# Patient Record
Sex: Female | Born: 1965 | Race: White | Hispanic: No | Marital: Married | Smoking: Never smoker
Health system: Southern US, Community
[De-identification: ages and names within clinical notes are randomized; demographics above are authoritative.]

## PROBLEM LIST (undated history)

## (undated) DIAGNOSIS — C801 Malignant (primary) neoplasm, unspecified: Secondary | ICD-10-CM

## (undated) DIAGNOSIS — M1990 Unspecified osteoarthritis, unspecified site: Secondary | ICD-10-CM

## (undated) DIAGNOSIS — D219 Benign neoplasm of connective and other soft tissue, unspecified: Secondary | ICD-10-CM

## (undated) DIAGNOSIS — R630 Anorexia: Secondary | ICD-10-CM

## (undated) DIAGNOSIS — Z973 Presence of spectacles and contact lenses: Secondary | ICD-10-CM

## (undated) DIAGNOSIS — F419 Anxiety disorder, unspecified: Secondary | ICD-10-CM

## (undated) DIAGNOSIS — Z801 Family history of malignant neoplasm of trachea, bronchus and lung: Secondary | ICD-10-CM

## (undated) DIAGNOSIS — Z803 Family history of malignant neoplasm of breast: Secondary | ICD-10-CM

## (undated) DIAGNOSIS — Z800 Family history of malignant neoplasm of digestive organs: Secondary | ICD-10-CM

## (undated) HISTORY — DX: Benign neoplasm of connective and other soft tissue, unspecified: D21.9

## (undated) HISTORY — DX: Family history of malignant neoplasm of breast: Z80.3

## (undated) HISTORY — DX: Family history of malignant neoplasm of digestive organs: Z80.0

## (undated) HISTORY — DX: Family history of malignant neoplasm of trachea, bronchus and lung: Z80.1

## (undated) HISTORY — DX: Anorexia: R63.0

## (undated) HISTORY — DX: Anxiety disorder, unspecified: F41.9

---
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
## 2013-09-05 ENCOUNTER — Other Ambulatory Visit: Payer: Self-pay | Admitting: Physician Assistant

## 2013-09-07 LAB — URINE CULTURE

## 2013-09-15 ENCOUNTER — Ambulatory Visit: Payer: Self-pay | Admitting: Obstetrics and Gynecology

## 2013-09-15 IMAGING — US ABDOMEN ULTRASOUND LIMITED
1 series · 14 of 25 positions shown · non-contrast
Comparison: 

REASON FOR EXAM: Nausea
COMMENTS:

PROCEDURE:     US  - US ABDOMEN LIMITED SURVEY  - [DATE]  [DATE]
RESULT:     Right or quadrant abdominal ultrasound dated
TECHNIQUE: Real time sonographic imaging of the right quadrant was obtained.
Static representative images were provided for interpretation.

[Series 1: abdomen ultrasound limited · 0.28mm/px · 14 of 57 slices shown]
[im 1/57]
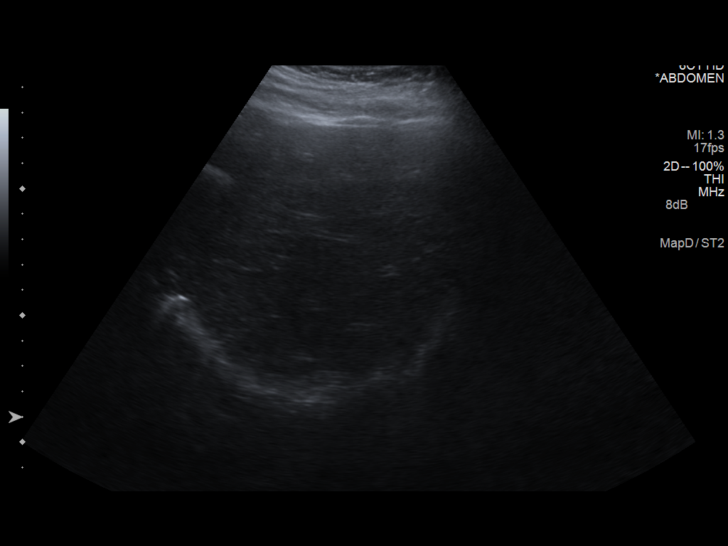
[im 5/57]
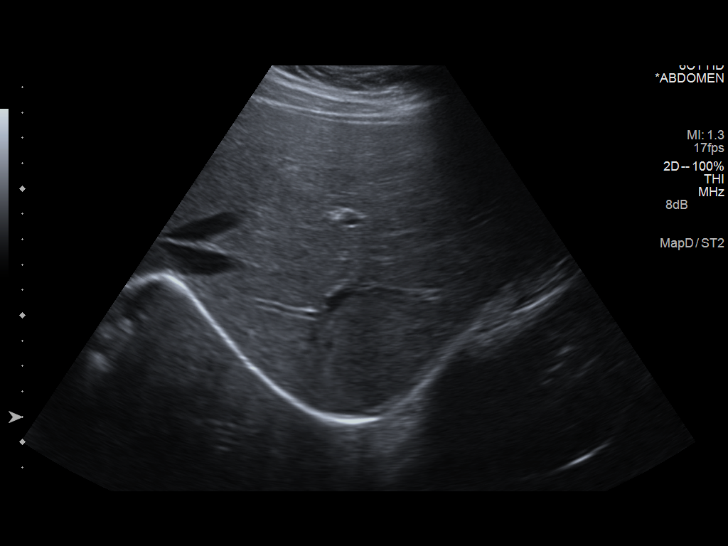
[im 10/57]
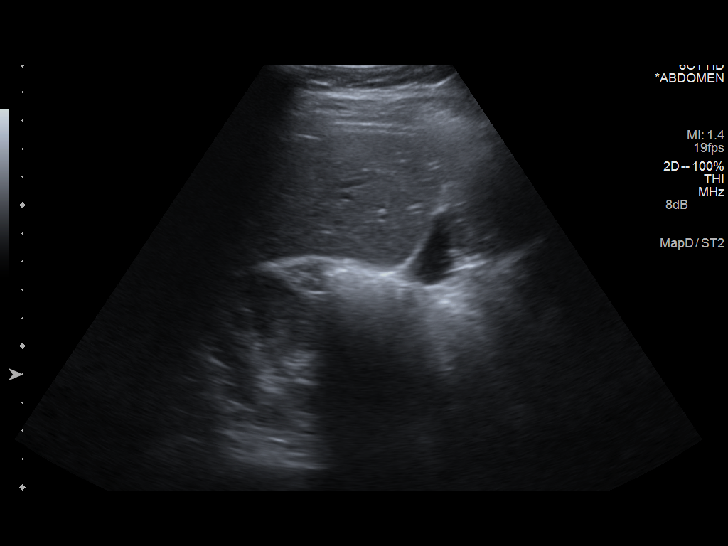
[im 15/57]
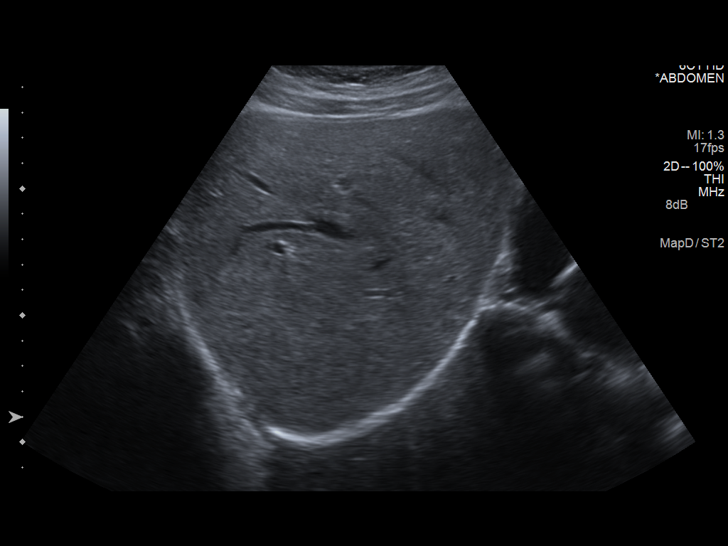
[im 19/57]
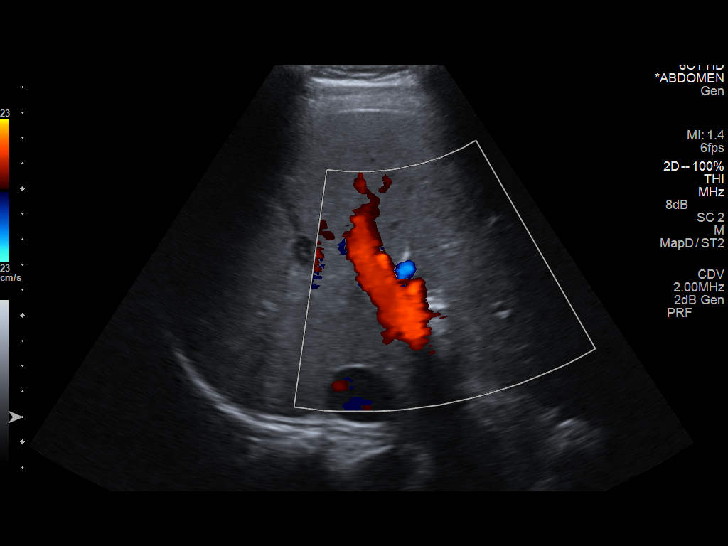
[im 22/57]
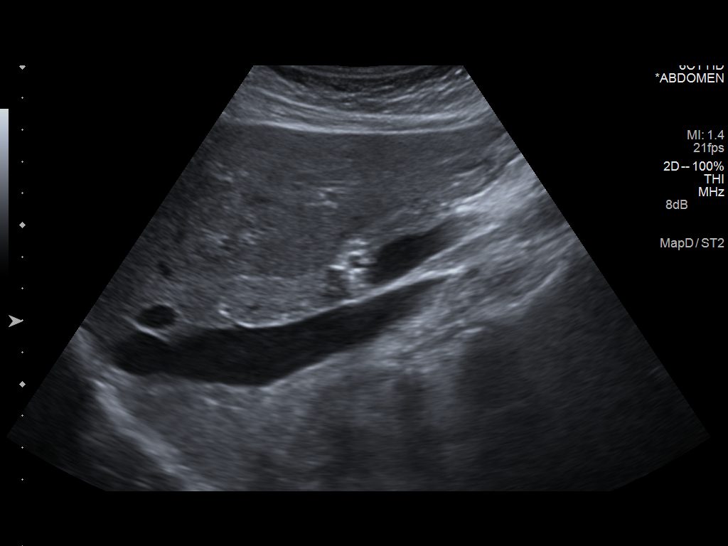
[im 26/57]
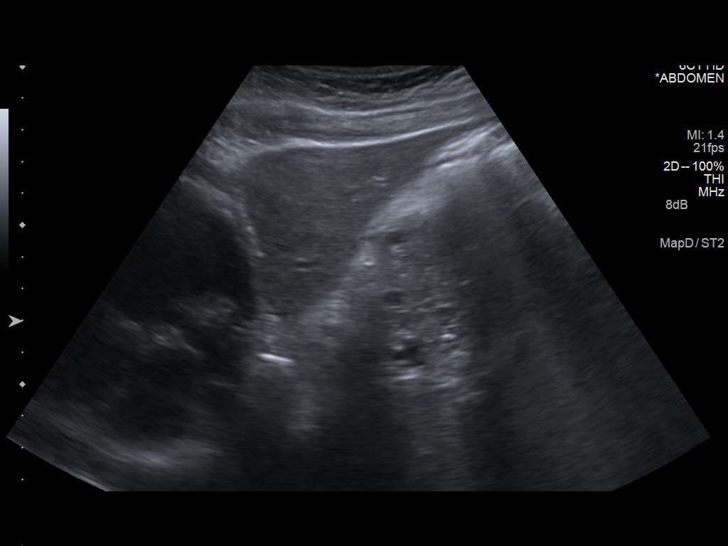
[im 31/57]
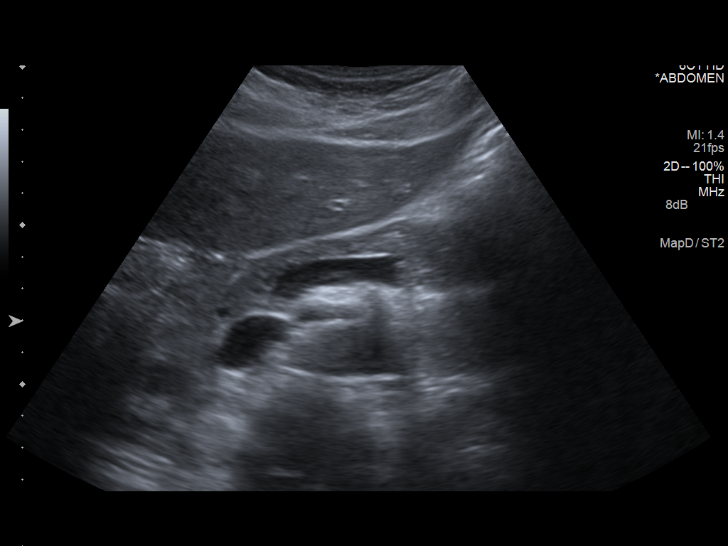
[im 36/57]
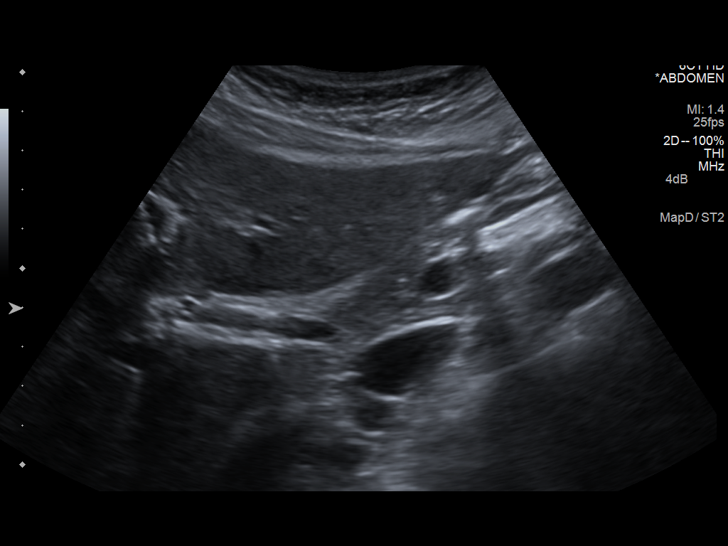
[im 38/57]
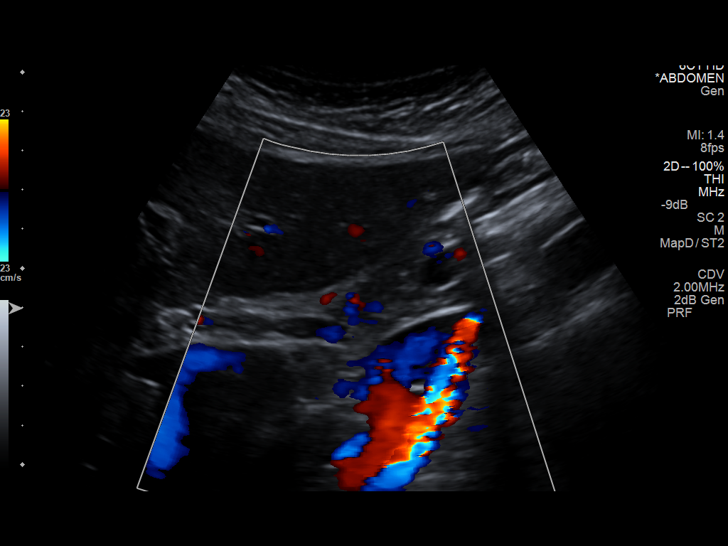
[im 43/57]
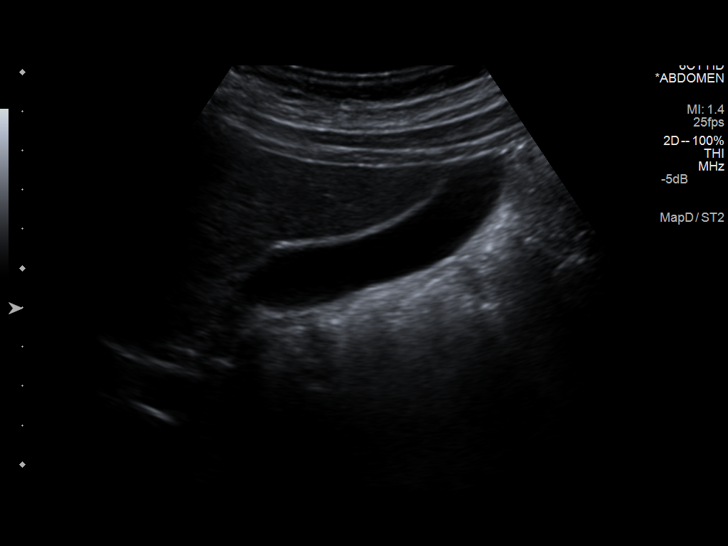
[im 47/57]
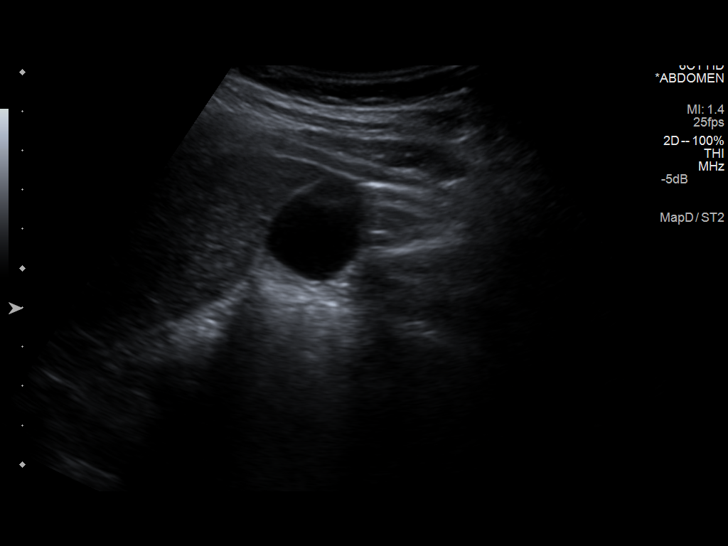
[im 52/57]
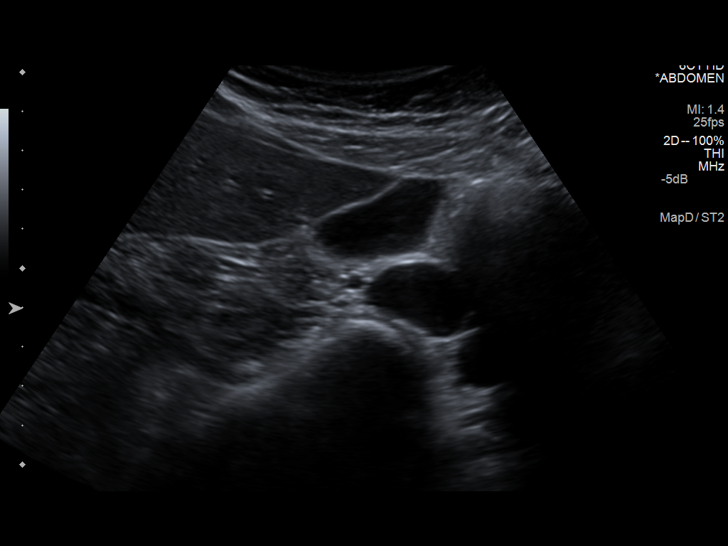
[im 57/57]
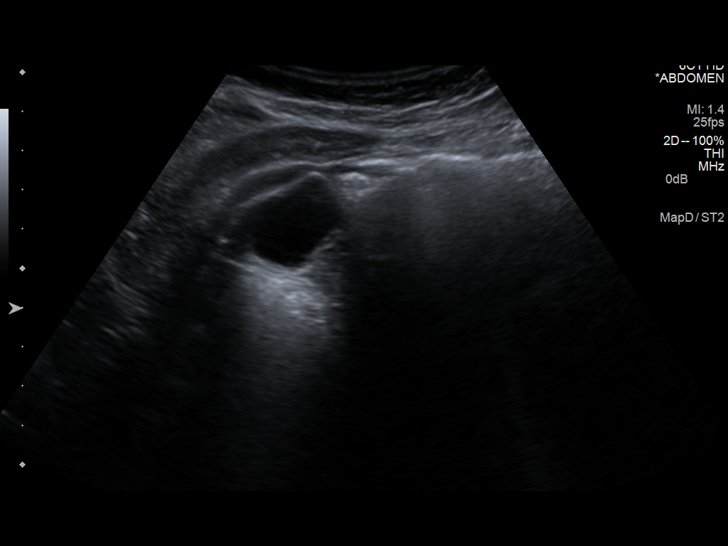

[14 of 25 positions shown; findings below may reference images not displayed]

FINDINGS: The liver is unremarkable. Hepatopedal flow is identified within
the portal vein.

There is no evidence of pericholecystic fluid, gallstones, sludging,
gallbladder wall thickening, nor common bile duct dilation. The gallbladder
wall thickness is 1.4 mm and the common bile duct measures 5.4 mm in
diameter. The technologist was unable to elicit a sonographic Murphy's sign.
The pancreas is unremarkable.
IMPRESSION: No sonographic evidence of cholecystitis nor cholelithiasis.

## 2014-03-16 ENCOUNTER — Ambulatory Visit: Payer: Self-pay | Admitting: *Deleted

## 2014-03-21 LAB — HM MAMMOGRAPHY

## 2015-04-11 LAB — HM MAMMOGRAPHY

## 2015-10-03 ENCOUNTER — Ambulatory Visit (INDEPENDENT_AMBULATORY_CARE_PROVIDER_SITE_OTHER): Payer: 59 | Admitting: Family Medicine

## 2015-10-03 ENCOUNTER — Encounter: Payer: Self-pay | Admitting: Family Medicine

## 2015-10-03 VITALS — BP 110/80 | HR 64 | Ht 61.0 in | Wt 146.0 lb

## 2015-10-03 DIAGNOSIS — M7062 Trochanteric bursitis, left hip: Secondary | ICD-10-CM | POA: Diagnosis not present

## 2015-10-03 DIAGNOSIS — Z7189 Other specified counseling: Secondary | ICD-10-CM

## 2015-10-03 DIAGNOSIS — Z7689 Persons encountering health services in other specified circumstances: Secondary | ICD-10-CM

## 2015-10-03 MED ORDER — MELOXICAM 15 MG PO TABS: 15.0000 mg | ORAL_TABLET | Freq: Every day | ORAL | Status: DC

## 2015-10-03 NOTE — Patient Instructions (Signed)
Hip Bursitis Bursitis is a swelling and soreness (inflammation) of a fluid-filled sac (bursa). This sac overlies and protects the joints.  CAUSES   Injury.  Overuse of the muscles surrounding the joint.  Arthritis.  Gout.  Infection.  Cold weather.  Inadequate warm-up and conditioning prior to activities. The cause may not be known.  SYMPTOMS   Mild to severe irritation.  Tenderness and swelling over the outside of the hip.  Pain with motion of the hip.  If the bursa becomes infected, a fever may be present. Redness, tenderness, and warmth will develop over the hip. Symptoms usually lessen in 3 to 4 weeks with treatment, but can come back. TREATMENT If conservative treatment does not work, your caregiver may advise draining the bursa and injecting cortisone into the area. This may speed up the healing process. This may also be used as an initial treatment of choice. HOME CARE INSTRUCTIONS   Apply ice to the affected area for 15-20 minutes every 3 to 4 hours while awake for the first 2 days. Put the ice in a plastic bag and place a towel between the bag of ice and your skin.  Rest the painful joint as much as possible, but continue to put the joint through a normal range of motion at least 4 times per day. When the pain lessens, begin normal, slow movements and usual activities to help prevent stiffness of the hip.  Only take over-the-counter or prescription medicines for pain, discomfort, or fever as directed by your caregiver.  Use crutches to limit weight bearing on the hip joint, if advised.  Elevate your painful hip to reduce swelling. Use pillows for propping and cushioning your legs and hips.  Gentle massage may provide comfort and decrease swelling. SEEK IMMEDIATE MEDICAL CARE IF:   Your pain increases even during treatment, or you are not improving.  You have a fever.  You have heat and inflammation over the involved bursa.  You have any other questions or  concerns. MAKE SURE YOU:   Understand these instructions.  Will watch your condition.  Will get help right away if you are not doing well or get worse.   This information is not intended to replace advice given to you by your health care provider. Make sure you discuss any questions you have with your health care provider.   Document Released: 04/24/2002 Document Revised: 01/25/2012 Document Reviewed: 06/04/2015 Elsevier Interactive Patient Education 2016 [employer]  

## 2015-10-03 NOTE — Progress Notes (Signed)
Name: Virginia [name]   MRN: [id number]    DOB: [birth year]   Date:10/03/2015       Progress Note  Subjective  Chief Complaint  Chief Complaint  Patient presents with  . Hip Pain    has had pain for approx 2 yrs, but has gotten worse for 5 months. Wakes her up at night- Aleve relieves pain some, but hip seems to get stiff the longer she walks    Hip Pain  There was no injury mechanism (left hip pain 3-4 months). The pain is present in the left leg and left hip. The quality of the pain is described as aching and cramping. The pain is at a severity of 6/10. The pain is moderate. The pain has been constant since onset. Pertinent negatives include no inability to bear weight, loss of motion, loss of sensation, muscle weakness, numbness or tingling. Exacerbated by: nocturnal during sleep. She has tried acetaminophen and NSAIDs for the symptoms. The treatment provided mild relief.    No problem-specific assessment & plan notes found for this encounter.   No past medical history on file.  Past Surgical History  Procedure Laterality Date  . Cleft palate repair      No family history on file.  Social History   Social History  . Marital Status: Married    Spouse Name: N/A  . Number of Children: N/A  . Years of Education: N/A   Occupational History  . Not on file.   Social History Main Topics  . Smoking status: Never Smoker   . Smokeless tobacco: Not on file  . Alcohol Use: No  . Drug Use: No  . Sexual Activity: Not on file   Other Topics Concern  . Not on file   Social History Narrative  . No narrative on file    No Known Allergies   Review of Systems  Constitutional: Negative for fever, chills, weight loss and malaise/fatigue.  HENT: Negative for ear discharge, ear pain and sore throat.   Eyes: Negative for blurred vision.  Respiratory: Negative for cough, sputum production, shortness of breath and wheezing.   Cardiovascular: Negative for chest pain, palpitations  and leg swelling.  Gastrointestinal: Negative for heartburn, nausea, abdominal pain, diarrhea, constipation, blood in stool and melena.  Genitourinary: Negative for dysuria, urgency, frequency and hematuria.  Musculoskeletal: Positive for myalgias. Negative for back pain, joint pain and neck pain.  Skin: Negative for rash.  Neurological: Negative for dizziness, tingling, sensory change, focal weakness, numbness and headaches.  Endo/Heme/Allergies: Negative for environmental allergies and polydipsia. Does not bruise/bleed easily.  Psychiatric/Behavioral: Negative for depression and suicidal ideas. The patient is not nervous/anxious and does not have insomnia.      Objective  Filed Vitals:   10/03/15 1059  BP: 110/80  Pulse: 64  Height: 5\' 1"  (1.549 m)  Weight: 146 lb (66.225 kg)    Physical Exam  Constitutional: She is well-developed, well-nourished, and in no distress. No distress.  HENT:  Head: Normocephalic and atraumatic.  Right Ear: External ear normal.  Left Ear: External ear normal.  Nose: Nose normal.  Mouth/Throat: Oropharynx is clear and moist.  Eyes: Conjunctivae and EOM are normal. Pupils are equal, round, and reactive to light. Right eye exhibits no discharge. Left eye exhibits no discharge.  Neck: Normal range of motion. Neck supple. No JVD present. No thyromegaly present.  Cardiovascular: Normal rate, regular rhythm, normal heart sounds and intact distal pulses.  Exam reveals no gallop and no friction rub.  No murmur heard. Pulmonary/Chest: Effort normal and breath sounds normal.  Abdominal: Soft. Bowel sounds are normal. She exhibits no mass. There is no tenderness. There is no guarding.  Musculoskeletal: Normal range of motion. She exhibits no edema.  Lymphadenopathy:    She has no cervical adenopathy.  Neurological: She is alert. She has normal reflexes.  Skin: Skin is warm and dry. She is not diaphoretic.  Psychiatric: Mood and affect normal.       Assessment & Plan  Problem List Items Addressed This Visit    None        Dr. Otilio Miu [facility] [place]  10/03/2015

## 2015-10-07 DIAGNOSIS — M25559 Pain in unspecified hip: Secondary | ICD-10-CM | POA: Insufficient documentation

## 2015-11-28 ENCOUNTER — Encounter: Payer: Self-pay | Admitting: Family Medicine

## 2015-11-28 ENCOUNTER — Ambulatory Visit (INDEPENDENT_AMBULATORY_CARE_PROVIDER_SITE_OTHER): Payer: 59 | Admitting: Family Medicine

## 2015-11-28 VITALS — BP 118/81 | HR 81 | Temp 98.1°F | Resp 16 | Ht 61.0 in | Wt 143.1 lb

## 2015-11-28 DIAGNOSIS — M25569 Pain in unspecified knee: Secondary | ICD-10-CM

## 2015-11-28 DIAGNOSIS — M25559 Pain in unspecified hip: Secondary | ICD-10-CM

## 2015-11-28 NOTE — Progress Notes (Signed)
Name: Virginia [name]   MRN: [id number]    DOB: 09-08-1966   Date:11/28/2015       Progress Note  Subjective  Chief Complaint  Chief Complaint  Patient presents with  . Pain    Hip, Fingers, Lower Back, Knees    Hip Pain  The incident occurred more than 1 week ago. The quality of the pain is described as aching. The pain is at a severity of 3/10. The pain is mild. The pain has been intermittent since onset. Pertinent negatives include no inability to bear weight, loss of motion, loss of sensation, muscle weakness, numbness or tingling. Nothing aggravates the symptoms. She has tried NSAIDs for the symptoms. The treatment provided mild relief.    No problem-specific assessment & plan notes found for this encounter.   History reviewed. No pertinent past medical history.  Past Surgical History  Procedure Laterality Date  . Cleft palate repair      History reviewed. No pertinent family history.  Social History   Social History  . Marital Status: Married    Spouse Name: N/A  . Number of Children: N/A  . Years of Education: N/A   Occupational History  . Not on file.   Social History Main Topics  . Smoking status: Never Smoker   . Smokeless tobacco: Not on file  . Alcohol Use: No  . Drug Use: No  . Sexual Activity: Not on file   Other Topics Concern  . Not on file   Social History Narrative    No Known Allergies   Review of Systems  Constitutional: Negative for fever, chills, weight loss and malaise/fatigue.  HENT: Negative for ear discharge, ear pain and sore throat.   Eyes: Negative for blurred vision.  Respiratory: Negative for cough, sputum production, shortness of breath and wheezing.   Cardiovascular: Negative for chest pain, palpitations and leg swelling.  Gastrointestinal: Negative for heartburn, nausea, abdominal pain, diarrhea, constipation, blood in stool and melena.  Genitourinary: Negative for dysuria, urgency, frequency and hematuria.   Musculoskeletal: Positive for joint pain. Negative for myalgias, back pain and neck pain.  Skin: Negative for rash.  Neurological: Negative for dizziness, tingling, sensory change, focal weakness, numbness and headaches.  Endo/Heme/Allergies: Negative for environmental allergies and polydipsia. Does not bruise/bleed easily.  Psychiatric/Behavioral: Negative for depression and suicidal ideas. The patient is not nervous/anxious and does not have insomnia.      Objective  Filed Vitals:   11/28/15 0926  BP: 118/81  Pulse: 81  Temp: 98.1 F (36.7 C)  Resp: 16  Height: _0  (1.549 m)  Weight: 143 lb 1.6 oz (64.91 kg)    Physical Exam  Constitutional: She is well-developed, well-nourished, and in no distress. No distress.  HENT:  Head: Normocephalic and atraumatic.  Right Ear: External ear normal.  Left Ear: External ear normal.  Nose: Nose normal.  Mouth/Throat: Oropharynx is clear and moist.  Eyes: Conjunctivae and EOM are normal. Pupils are equal, round, and reactive to light. Right eye exhibits no discharge. Left eye exhibits no discharge.  Neck: Normal range of motion. Neck supple. No JVD present. No thyromegaly present.  Cardiovascular: Normal rate, regular rhythm, normal heart sounds and intact distal pulses.  Exam reveals no gallop and no friction rub.   No murmur heard. Pulmonary/Chest: Effort normal and breath sounds normal.  Abdominal: Soft. Bowel sounds are normal. She exhibits no mass. There is no tenderness. There is no guarding.  Musculoskeletal: Normal range of motion. She exhibits no  edema.       Right wrist: She exhibits tenderness.       Right hip: She exhibits tenderness.       Left hip: She exhibits tenderness.       Right hand: She exhibits tenderness.  Lymphadenopathy:    She has no cervical adenopathy.  Neurological: She is alert. She has normal reflexes.  Skin: Skin is warm and dry. She is not diaphoretic.  Psychiatric: Mood and affect normal.   Nursing note and vitals reviewed.     Assessment & Plan  Problem List Items Addressed This Visit    None    Visit Diagnoses    Chronic arthralgias of knees and hips, unspecified laterality    -  Primary    tylenol with episodic meloxicam    Relevant Orders    Sed Rate (ESR)         Dr. [facility]  11/28/2015

## 2015-11-28 NOTE — Patient Instructions (Signed)

## 2015-11-29 LAB — SEDIMENTATION RATE: Sed Rate: 2 mm/hr (ref 0–32)

## 2016-03-12 MED FILL — ESCITALOPRAM 20 MG TABLET: 20 | 90 days supply | Qty: 90 | Fill #2

## 2016-07-16 DIAGNOSIS — D225 Melanocytic nevi of trunk: Secondary | ICD-10-CM | POA: Diagnosis not present

## 2016-07-16 DIAGNOSIS — L859 Epidermal thickening, unspecified: Secondary | ICD-10-CM | POA: Diagnosis not present

## 2016-07-16 DIAGNOSIS — L821 Other seborrheic keratosis: Secondary | ICD-10-CM | POA: Diagnosis not present

## 2016-07-16 DIAGNOSIS — D2271 Melanocytic nevi of right lower limb, including hip: Secondary | ICD-10-CM | POA: Diagnosis not present

## 2016-07-16 DIAGNOSIS — D2272 Melanocytic nevi of left lower limb, including hip: Secondary | ICD-10-CM | POA: Diagnosis not present

## 2016-07-16 DIAGNOSIS — D2261 Melanocytic nevi of right upper limb, including shoulder: Secondary | ICD-10-CM | POA: Diagnosis not present

## 2016-07-16 DIAGNOSIS — D2262 Melanocytic nevi of left upper limb, including shoulder: Secondary | ICD-10-CM | POA: Diagnosis not present

## 2016-09-04 DIAGNOSIS — F509 Eating disorder, unspecified: Secondary | ICD-10-CM | POA: Diagnosis not present

## 2016-09-04 DIAGNOSIS — Z0000 Encounter for general adult medical examination without abnormal findings: Secondary | ICD-10-CM | POA: Diagnosis not present

## 2016-09-07 DIAGNOSIS — F509 Eating disorder, unspecified: Secondary | ICD-10-CM | POA: Diagnosis not present

## 2016-09-17 DIAGNOSIS — Z713 Dietary counseling and surveillance: Secondary | ICD-10-CM | POA: Diagnosis not present

## 2016-09-17 DIAGNOSIS — Z0000 Encounter for general adult medical examination without abnormal findings: Secondary | ICD-10-CM | POA: Diagnosis not present

## 2016-09-17 DIAGNOSIS — E559 Vitamin D deficiency, unspecified: Secondary | ICD-10-CM | POA: Diagnosis not present

## 2016-09-17 DIAGNOSIS — F509 Eating disorder, unspecified: Secondary | ICD-10-CM | POA: Diagnosis not present

## 2016-09-17 DIAGNOSIS — Z1231 Encounter for screening mammogram for malignant neoplasm of breast: Secondary | ICD-10-CM | POA: Diagnosis not present

## 2016-09-17 DIAGNOSIS — Z01419 Encounter for gynecological examination (general) (routine) without abnormal findings: Secondary | ICD-10-CM | POA: Diagnosis not present

## 2016-09-28 DIAGNOSIS — Z1231 Encounter for screening mammogram for malignant neoplasm of breast: Secondary | ICD-10-CM | POA: Diagnosis not present

## 2016-10-21 DIAGNOSIS — J019 Acute sinusitis, unspecified: Secondary | ICD-10-CM | POA: Diagnosis not present

## 2016-10-22 MED FILL — ESCITALOPRAM 20 MG TABLET: 20 | 90 days supply | Qty: 90 | Fill #0

## 2016-10-22 MED FILL — AZITHROMYCIN 250 MG TABLET: 250 | 5 days supply | Qty: 6 | Fill #0

## 2017-03-05 MED FILL — ESCITALOPRAM 20 MG TABLET: 20 | 90 days supply | Qty: 90 | Fill #1

## 2017-04-21 DIAGNOSIS — R42 Dizziness and giddiness: Secondary | ICD-10-CM | POA: Diagnosis not present

## 2017-04-21 DIAGNOSIS — H65192 Other acute nonsuppurative otitis media, left ear: Secondary | ICD-10-CM | POA: Diagnosis not present

## 2017-04-21 DIAGNOSIS — R0981 Nasal congestion: Secondary | ICD-10-CM | POA: Diagnosis not present

## 2017-06-09 ENCOUNTER — Ambulatory Visit (INDEPENDENT_AMBULATORY_CARE_PROVIDER_SITE_OTHER): Payer: 59 | Admitting: Advanced Practice Midwife

## 2017-06-09 ENCOUNTER — Encounter: Payer: Self-pay | Admitting: Advanced Practice Midwife

## 2017-06-09 VITALS — BP 108/58 | HR 78 | Ht 61.0 in | Wt 135.0 lb

## 2017-06-09 DIAGNOSIS — Z0000 Encounter for general adult medical examination without abnormal findings: Secondary | ICD-10-CM

## 2017-06-09 DIAGNOSIS — Z01419 Encounter for gynecological examination (general) (routine) without abnormal findings: Secondary | ICD-10-CM | POA: Diagnosis not present

## 2017-06-09 DIAGNOSIS — Z124 Encounter for screening for malignant neoplasm of cervix: Secondary | ICD-10-CM

## 2017-06-09 NOTE — Progress Notes (Signed)
Patient ID: Virginia [name], female   DOB: 10-02-1966, 51 y.o.   MRN: [id number]     Gynecology Annual Exam  PCP: Patient, No Pcp Per  Chief Complaint:  Chief Complaint  Patient presents with  . Gynecologic Exam    History of Present Illness: Patient is a 51 y.o. [id number] presents for annual exam. The patient has complaints today of leakage of stool. She has a history of perineal laceration with rectal involvement. She was diagnosed with weakness in rectal wall and internal hemorrhoids in 2007. At that time she was having leakage of stool. She had a procedure to remove the hemorrhoids which resolved the leakage until about 3-4 months ago when she had a recurrence. She states this interferes with her daily life. She has not had a colonoscopy. She requests other screening lab work today.  LMP: No LMP recorded. Patient is not currently having periods (Reason: IUD). Postcoital Bleeding: no Dysmenorrhea: not applicable   The patient is sexually active. She currently uses IUD for contraception. She denies dyspareunia.  The patient does perform self breast exams.  There is no notable family history of breast or ovarian cancer in her family.  The patient wears seatbelts: yes.   The patient has regular exercise: yes.    The patient denies current symptoms of depression.    Review of Systems: Review of Systems  Constitutional: Negative.   HENT: Negative.   Eyes: Negative.   Respiratory: Negative.   Cardiovascular: Negative.   Gastrointestinal: Negative.   Genitourinary: Negative.   Musculoskeletal: Negative.   Skin: Negative.   Neurological: Negative.   Endo/Heme/Allergies: Negative.   Psychiatric/Behavioral: Negative.     Past Medical History:  History reviewed. No pertinent past medical history.  Past Surgical History:  Past Surgical History:  Procedure Laterality Date  . CLEFT PALATE REPAIR      Gynecologic History:  No LMP recorded. Patient is not currently having  periods (Reason: IUD). Contraception: IUD Last Pap: Results were: 3 years ago no abnormalities  Last mammogram: 1 year ago Results were: BI-RAD I Obstetric History: [id number]  Family History:  History reviewed. No pertinent family history.  Social History:  Social History   Social History  . Marital status: Married    Spouse name: N/A  . Number of children: N/A  . Years of education: N/A   Occupational History  . Not on file.   Social History Main Topics  . Smoking status: Never Smoker  . Smokeless tobacco: Never Used  . Alcohol use No  . Drug use: No  . Sexual activity: Yes    Partners: Male    Birth control/ protection: IUD   Other Topics Concern  . Not on file   Social History Narrative  . No narrative on file    Allergies:  No Known Allergies  Medications: Prior to Admission medications   Medication Sig Start Date End Date Taking? Authorizing Provider  escitalopram (LEXAPRO) 20 MG tablet Take 1 tablet by mouth daily. 07/08/15  Yes [provider]  levonorgestrel (MIRENA) 20 MCG/24HR IUD 1 each by Intrauterine route once.   Yes [place]    Physical Exam Vitals: Blood pressure (!) 108/58, pulse 78, height 5\' 1"  (1.549 m), weight 135 lb (61.2 kg).  General: NAD HEENT: normocephalic, anicteric Thyroid: no enlargement, no palpable nodules Pulmonary: No increased work of breathing, CTAB Cardiovascular: RRR, distal pulses 2+ Breast: Breast symmetrical, no tenderness, no palpable nodules or masses, no skin or nipple retraction  present, no nipple discharge.  No axillary or supraclavicular lymphadenopathy. Abdomen: NABS, soft, non-tender, non-distended.  Umbilicus without lesions.  No hepatomegaly, splenomegaly or masses palpable. No evidence of hernia  Genitourinary:  External: Normal external female genitalia.  Normal urethral meatus, normal  Bartholin's and Skene's glands.    Vagina: Normal vaginal mucosa, no evidence of prolapse.     Cervix: Grossly normal in appearance, no bleeding, no CMT  Uterus: Non-enlarged, mobile, normal contour.    Adnexa: ovaries non-enlarged, no adnexal masses  Rectal: no internal hemorrhoids palpated  Lymphatic: no evidence of inguinal lymphadenopathy Extremities: no edema, erythema, or tenderness Neurologic: Grossly intact Psychiatric: mood appropriate, affect full   Assessment: 51 y.o. [id number] Well woman exam with PAP smear  Plan: Problem List Items Addressed This Visit    None    Visit Diagnoses    Well woman exam with routine gynecological exam    -  Primary   Relevant Orders   IGP, Aptima HPV   Blood tests for routine general physical examination       Relevant Orders   Lipid Panel With LDL/HDL Ratio   VITAMIN D 25 Hydroxy (Vit-D Deficiency, Fractures)   CBC   Hgb A1c w/o eAG   Cervical cancer screening       Relevant Orders   IGP, Aptima HPV      1) Mammogram - recommend yearly screening mammogram.  Mammogram Was ordered today   2) STI screening was offered and declined  3) ASCCP guidelines and rational discussed.  Patient opts for every 3 years screening interval  4) Contraception - patient plans to continue with Mirena  5) Colonoscopy: referral sent today  Screening recommended starting at age 49 for average risk individuals, age 64 for individuals deemed at increased risk (including African Americans) and recommended to continue until age 49.  For patient age 49-85 individualized approach is recommended.  Gold standard screening is via colonoscopy, Cologuard screening is an acceptable alternative for patient unwilling or unable to undergo colonoscopy.  "Colorectal cancer screening for average?risk adults: 2018 guideline update from the [place]: A Cancer Journal for Clinicians: Apr 14, 2017   6) Routine healthcare maintenance including cholesterol, diabetes screening discussed Ordered today   7) Return to clinic in 1 year for annual  exam   Rod Can, CNM

## 2017-06-10 LAB — CBC
Hematocrit: 37.4 % (ref 34.0–46.6)
Hemoglobin: 12.5 g/dL (ref 11.1–15.9)
MCH: 29.5 pg (ref 26.6–33.0)
MCHC: 33.4 g/dL (ref 31.5–35.7)
MCV: 88 fL (ref 79–97)
Platelets: 175 10*3/uL (ref 150–379)
RBC: 4.24 x10E6/uL (ref 3.77–5.28)
RDW: 13.3 % (ref 12.3–15.4)
WBC: 8.4 10*3/uL (ref 3.4–10.8)

## 2017-06-10 LAB — VITAMIN D 25 HYDROXY (VIT D DEFICIENCY, FRACTURES): Vit D, 25-Hydroxy: 29.2 ng/mL — ABNORMAL LOW (ref 30.0–100.0)

## 2017-06-10 LAB — LIPID PANEL WITH LDL/HDL RATIO
Cholesterol, Total: 157 mg/dL (ref 100–199)
HDL: 63 mg/dL (ref >39)
LDL Calculated: 78 mg/dL (ref 0–99)
LDl/HDL Ratio: 1.2 ratio (ref 0.0–3.2)
Triglycerides: 78 mg/dL (ref 0–149)
VLDL Cholesterol Cal: 16 mg/dL (ref 5–40)

## 2017-06-10 LAB — HGB A1C W/O EAG: Hgb A1c MFr Bld: 5.1 % (ref 4.8–5.6)

## 2017-06-12 LAB — IGP, APTIMA HPV
HPV Aptima: NEGATIVE
PAP Smear Comment: 0

## 2017-06-15 ENCOUNTER — Telehealth: Payer: Self-pay

## 2017-06-15 ENCOUNTER — Other Ambulatory Visit: Payer: Self-pay

## 2017-06-15 DIAGNOSIS — Z1211 Encounter for screening for malignant neoplasm of colon: Secondary | ICD-10-CM

## 2017-06-15 NOTE — Telephone Encounter (Signed)
Gastroenterology Pre-Procedure Review  Request Date: 08/06/17 Requesting Physician: Dr. Vicente Males  PATIENT REVIEW QUESTIONS: The patient responded to the following health history questions as indicated:    1. Are you having any GI issues? no 2. Do you have a personal history of Polyps? no 3. Do you have a family history of Colon Cancer or Polyps? no 4. Diabetes Mellitus? no 5. Joint replacements in the past 12 months?no 6. Major health problems in the past 3 months?no 7. Any artificial heart valves, MVP, or defibrillator?no    MEDICATIONS & ALLERGIES:    Patient reports the following regarding taking any anticoagulation/antiplatelet therapy:   Plavix, Coumadin, Eliquis, Xarelto, Lovenox, Pradaxa, Brilinta, or Effient? no Aspirin? no  Patient confirms/reports the following medications:  Current Outpatient Prescriptions  Medication Sig Dispense Refill  . escitalopram (LEXAPRO) 20 MG tablet Take 1 tablet by mouth daily.  4  . levonorgestrel (MIRENA) 20 MCG/24HR IUD 1 each by Intrauterine route once.     No current facility-administered medications for this visit.     Patient confirms/reports the following allergies:  No Known Allergies  No orders of the defined types were placed in this encounter.   AUTHORIZATION INFORMATION Primary Insurance: 1D#: Group #:  Secondary Insurance: 1D#: Group #:  SCHEDULE INFORMATION: Date: 08/06/17 Time: Location:[facility]

## 2017-07-15 ENCOUNTER — Ambulatory Visit: Payer: Self-pay | Admitting: Family Medicine

## 2017-07-23 MED FILL — ESCITALOPRAM 20 MG TABLET: 20 | 90 days supply | Qty: 90 | Fill #2

## 2017-08-03 ENCOUNTER — Telehealth: Payer: Self-pay | Admitting: Gastroenterology

## 2017-08-03 NOTE — Telephone Encounter (Signed)
Returned patient's call to reschedule.   LVM for callback.

## 2017-08-03 NOTE — Telephone Encounter (Signed)
Please call patient as she needs to r/s her procedure this Fri 08/06/17 due to work schedule.

## 2017-08-05 ENCOUNTER — Encounter: Payer: Self-pay | Admitting: *Deleted

## 2017-08-06 ENCOUNTER — Ambulatory Visit: Admission: RE | Admit: 2017-08-06 | Payer: 59 | Source: Ambulatory Visit | Admitting: Gastroenterology

## 2017-08-06 ENCOUNTER — Encounter: Payer: Self-pay | Admitting: Anesthesiology

## 2017-08-06 ENCOUNTER — Encounter: Admission: RE | Payer: Self-pay | Source: Ambulatory Visit

## 2017-08-06 SURGERY — COLONOSCOPY WITH PROPOFOL
Anesthesia: General

## 2017-08-19 ENCOUNTER — Ambulatory Visit (INDEPENDENT_AMBULATORY_CARE_PROVIDER_SITE_OTHER): Payer: 59 | Admitting: Obstetrics & Gynecology

## 2017-08-19 ENCOUNTER — Encounter: Payer: Self-pay | Admitting: Obstetrics & Gynecology

## 2017-08-19 VITALS — BP 82/60 | HR 81 | Ht 61.0 in | Wt 135.0 lb

## 2017-08-19 DIAGNOSIS — N83202 Unspecified ovarian cyst, left side: Secondary | ICD-10-CM | POA: Diagnosis not present

## 2017-08-19 DIAGNOSIS — N926 Irregular menstruation, unspecified: Secondary | ICD-10-CM | POA: Insufficient documentation

## 2017-08-19 NOTE — Progress Notes (Signed)
HPI:      [name] is a 51 y.o. [id number] who LMP was Patient's last menstrual period was 08/14/2017., presents today for a problem visit.  She complains of bleeding daily that  began several days ago and its severity is described as moderate.  She has had amenorrhea since Mirena placed 4 1/2 years ago. Current sx's have associated findings of nausea, fatigue, and crampiness.  No modifiers She has used the following for attempts at control: thin pad.  Previous evaluation: none. 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 Previous Treatment: none.  She is single partner, contraception - vasectomy.   PMHx: She  has no past medical history on file. Also,  has a past surgical history that includes Cleft palate repair., family history is not on file.,  reports that she has never smoked. She has never used smokeless tobacco. She reports that she does not drink alcohol or use drugs.  She  Current Outpatient Prescriptions:  .  escitalopram (LEXAPRO) 20 MG tablet, Take 1 tablet by mouth daily., Disp: , Rfl: 4 .  levonorgestrel (MIRENA) 20 MCG/24HR IUD, 1 each by Intrauterine route once., Disp: , Rfl:   Also, has No Known Allergies.  Review of Systems  Constitutional: Negative for chills, fever and malaise/fatigue.  HENT: Negative for congestion, sinus pain and sore throat.   Eyes: Negative for blurred vision and pain.  Respiratory: Negative for cough and wheezing.   Cardiovascular: Negative for chest pain and leg swelling.  Gastrointestinal: Negative for abdominal pain, constipation, diarrhea, heartburn, nausea and vomiting.  Genitourinary: Negative for dysuria, frequency, hematuria and urgency.  Musculoskeletal: Negative for back pain, joint pain, myalgias and neck pain.  Skin: Negative for itching and rash.  Neurological: Negative for dizziness, tremors and weakness.  Endo/Heme/Allergies: Does not bruise/bleed easily.  Psychiatric/Behavioral: Negative for depression.  The patient is not nervous/anxious and does not have insomnia.    Objective: BP (!) 82/60   Pulse 81   Ht 5\' 1"  (1.549 m)   Wt 135 lb (61.2 kg)   LMP 08/14/2017   BMI 25.51 kg/m  Physical Exam  Constitutional: She is oriented to person, place, and time. She appears well-developed and well-nourished. No distress.  Genitourinary: Vagina normal and uterus normal. Pelvic exam was performed with patient supine. There is no rash, tenderness or lesion on the right labia. There is no rash, tenderness or lesion on the left labia. No erythema or bleeding in the vagina. Right adnexum does not display mass and does not display tenderness.  Left adnexum displays mass, displays tenderness and displays fullness. Cervix does not exhibit motion tenderness, discharge, polyp or nabothian cyst.   Uterus is mobile and anteverted. Uterus is not enlarged or exhibiting a mass.  Genitourinary Comments: IUD String 1 cm LEFT firm Adnexal mass, sl T Uterus Anteverted  Abdominal: Soft. She exhibits no distension. There is no tenderness.  Musculoskeletal: Normal range of motion.  Neurological: She is alert and oriented to person, place, and time. No cranial nerve deficit.  Skin: Skin is warm and dry.  Psychiatric: She has a normal mood and affect.   ASSESSMENT/PLAN:  irregular menses and left ovarian mass  Problem List Items Addressed This Visit      Other   Irregular bleeding - Primary (new finding)   Relevant Orders   US PELVIS TRANSVANGINAL    Other Visit Diagnoses    Left ovarian cyst    (new finding)   Relevant Orders   US  PELVIS TRANSVANGINAL     BTB may be related to hormones of IUD giving out in final year, and woiuld possibly be fixed by replacement.  Also could take out and see what natural pattern is at this time.  Vasec so low risk for pregnancy.  Assess for ovarian cyst/mass as well- new finding  Barnett Applebaum, MD, [facility], [facility] 08/19/2017  4:16 PM

## 2017-08-25 ENCOUNTER — Other Ambulatory Visit: Payer: 59

## 2017-08-25 ENCOUNTER — Ambulatory Visit: Payer: 59 | Admitting: Obstetrics & Gynecology

## 2017-08-30 ENCOUNTER — Ambulatory Visit: Payer: 59 | Admitting: Obstetrics & Gynecology

## 2017-08-30 ENCOUNTER — Other Ambulatory Visit: Payer: 59

## 2017-08-31 ENCOUNTER — Ambulatory Visit (INDEPENDENT_AMBULATORY_CARE_PROVIDER_SITE_OTHER): Payer: 59 | Admitting: Obstetrics & Gynecology

## 2017-08-31 ENCOUNTER — Encounter: Payer: Self-pay | Admitting: Obstetrics & Gynecology

## 2017-08-31 ENCOUNTER — Other Ambulatory Visit: Payer: Self-pay | Admitting: Obstetrics & Gynecology

## 2017-08-31 ENCOUNTER — Ambulatory Visit (INDEPENDENT_AMBULATORY_CARE_PROVIDER_SITE_OTHER): Payer: 59

## 2017-08-31 VITALS — BP 110/70 | HR 79 | Ht 63.0 in | Wt 139.0 lb

## 2017-08-31 DIAGNOSIS — N83202 Unspecified ovarian cyst, left side: Secondary | ICD-10-CM | POA: Diagnosis not present

## 2017-08-31 DIAGNOSIS — N926 Irregular menstruation, unspecified: Secondary | ICD-10-CM | POA: Diagnosis not present

## 2017-08-31 DIAGNOSIS — D219 Benign neoplasm of connective and other soft tissue, unspecified: Secondary | ICD-10-CM | POA: Insufficient documentation

## 2017-08-31 NOTE — Patient Instructions (Signed)
Total Laparoscopic Hysterectomy °A total laparoscopic hysterectomy is a minimally invasive surgery to remove your uterus and cervix. This surgery is performed by making several small cuts (incisions) in your abdomen. It can also be done with a thin, lighted tube (laparoscope) inserted into two small incisions in your lower abdomen. Your fallopian tubes and ovaries can be removed (bilateral salpingo-oophorectomy) during this surgery as well. Benefits of minimally invasive surgery include: °· Less pain. °· Less risk of blood loss. °· Less risk of infection. °· Quicker return to normal activities. ° °Tell a health care provider about: °· Any allergies you have. °· All medicines you are taking, including vitamins, herbs, eye drops, creams, and over-the-counter medicines. °· Any problems you or family members have had with anesthetic medicines. °· Any blood disorders you have. °· Any surgeries you have had. °· Any medical conditions you have. °What are the risks? °Generally, this is a safe procedure. However, as with any procedure, complications can occur. Possible complications include: °· Bleeding. °· Blood clots in the legs or lung. °· Infection. °· Injury to surrounding organs. °· Problems with anesthesia. °· Early menopause symptoms (hot flashes, night sweats, insomnia). °· Risk of conversion to an open abdominal incision. ° °What happens before the procedure? °· Ask your health care provider about changing or stopping your regular medicines. °· Do not take aspirin or blood thinners (anticoagulants) for 1 week before the surgery or as told by your health care provider. °· Do not eat or drink anything for 8 hours before the surgery or as told by your health care provider. °· Quit smoking if you smoke. °· Arrange for a ride home after surgery and for someone to help you at home during recovery. °What happens during the procedure? °· You will be given antibiotic medicine. °· An IV tube will be placed in your arm. You  will be given medicine to make you sleep (general anesthetic). °· A gas (carbon dioxide) will be used to inflate your abdomen. This will allow your surgeon to look inside your abdomen, perform your surgery, and treat any other problems found if necessary. °· Three or four small incisions (often less than 1/2 inch) will be made in your abdomen. One of these incisions will be made in the area of your belly button (navel). The laparoscope will be inserted into the incision. Your surgeon will look through the laparoscope while doing your procedure. °· Other surgical instruments will be inserted through the other incisions. °· Your uterus may be removed through your vagina or cut into small pieces and removed through the small incisions. °· Your incisions will be closed. °What happens after the procedure? °· The gas will be released from inside your abdomen. °· You will be taken to the recovery area where a nurse will watch and check your progress. Once you are awake, stable, and taking fluids well, without other problems, you will return to your room or be allowed to go home. °· There is usually minimal discomfort following the surgery because the incisions are so small. °· You will be given pain medicine while you are in the hospital and for when you go home. °This information is not intended to replace advice given to you by your health care provider. Make sure you discuss any questions you have with your health care provider. °Document Released: 08/30/2007 Document Revised: 04/09/2016 Document Reviewed: 05/23/2013 °Elsevier Interactive Patient Education © 2017 Elsevier Inc. ° °

## 2017-08-31 NOTE — Progress Notes (Signed)
  HPI: Patient presents with abnormal BTB on 4th year of Mirena as well as abnormal pelvic exam with enlarged uterus or pelvic mass. Periods are rare, lasting a few days. Dysmenorrhea:none. Cyclic symptoms include concern over a eating disorder when she has reg cycles (IUD has helped). No intermenstrual bleeding, spotting, or discharge.   Ultrasound demonstrates 3 fibroids seen, one submucosal.  3,3, and 5 cm. These findings are Pelvis abnormal and also IUD seen malpositioned somewhat due to fibroid.   PMHx: She  has no past medical history on file. Also,  has a past surgical history that includes Cleft palate repair., family history is not on file.,  reports that she has never smoked. She has never used smokeless tobacco. She reports that she does not drink alcohol or use drugs.  She has a current medication list which includes the following prescription(s): escitalopram and levonorgestrel. Also, has No Known Allergies.  Review of Systems  All other systems reviewed and are negative.   Objective: BP 110/70   Pulse 79   Ht 5\' 3"  (1.6 m)   Wt 139 lb (63 kg)   LMP 08/14/2017   BMI 24.62 kg/m   Physical examination Constitutional NAD, Conversant  Skin No rashes, lesions or ulceration.   Extremities: Moves all appropriately.  Normal ROM for age. No lymphadenopathy.  Neuro: Grossly intact  Psych: Oriented to PPT.  Normal mood. Normal affect.  Pelvic exam:  Two IUD strings present seen coming from the cervical os. EGBUS, vaginal vault and cervix: within normal limits  IUD Removal Strings of IUD identified and grasped.  IUD removed without problem.  Pt tolerated this well.  IUD noted to be intact.  Assessment:  Irregular bleeding and Fibroid Uterus (IM and SM)    - new problem, higher complexity of decision making and care moving forward  Options discussed for fibroid tx, esp based on sx's Will remove IUD today ([facility] for [facility]) and monitor for sx's of fibroids, esp bleeding pain or  bladder sx's. Lupron, [place], Myomectomy, and hysterectomy discussed as tx options.  Info provided.  Barnett Applebaum, MD, [facility], [facility] 08/31/2017  11:37 AM

## 2017-09-30 ENCOUNTER — Encounter: Payer: Self-pay | Admitting: Family Medicine

## 2017-09-30 ENCOUNTER — Ambulatory Visit (INDEPENDENT_AMBULATORY_CARE_PROVIDER_SITE_OTHER): Payer: 59 | Admitting: Family Medicine

## 2017-09-30 VITALS — BP 108/60 | HR 84 | Temp 98.4°F | Resp 16 | Ht 61.0 in | Wt 138.0 lb

## 2017-09-30 DIAGNOSIS — N926 Irregular menstruation, unspecified: Secondary | ICD-10-CM

## 2017-09-30 DIAGNOSIS — D219 Benign neoplasm of connective and other soft tissue, unspecified: Secondary | ICD-10-CM

## 2017-09-30 DIAGNOSIS — E559 Vitamin D deficiency, unspecified: Secondary | ICD-10-CM | POA: Insufficient documentation

## 2017-09-30 DIAGNOSIS — F509 Eating disorder, unspecified: Secondary | ICD-10-CM

## 2017-09-30 DIAGNOSIS — Z8349 Family history of other endocrine, nutritional and metabolic diseases: Secondary | ICD-10-CM

## 2017-09-30 DIAGNOSIS — F419 Anxiety disorder, unspecified: Secondary | ICD-10-CM

## 2017-09-30 LAB — COMPLETE METABOLIC PANEL WITH GFR
AG Ratio: 1.8 (calc) (ref 1.0–2.5)
ALT: 23 U/L (ref 6–29)
AST: 16 U/L (ref 10–35)
Albumin: 4.2 g/dL (ref 3.6–5.1)
Alkaline phosphatase (APISO): 51 U/L (ref 33–115)
BUN: 15 mg/dL (ref 7–25)
CO2: 27 mmol/L (ref 20–32)
Calcium: 9 mg/dL (ref 8.6–10.2)
Chloride: 104 mmol/L (ref 98–110)
Creat: 0.78 mg/dL (ref 0.50–1.10)
GFR, Est African American: 103 mL/min/{1.73_m2} (ref >=60)
GFR, Est Non African American: 89 mL/min/{1.73_m2} (ref >=60)
Globulin: 2.3 g/dL (calc) (ref 1.9–3.7)
Glucose, Bld: 78 mg/dL (ref 65–99)
Potassium: 4 mmol/L (ref 3.5–5.3)
Sodium: 138 mmol/L (ref 135–146)
Total Bilirubin: 0.4 mg/dL (ref 0.2–1.2)
Total Protein: 6.5 g/dL (ref 6.1–8.1)

## 2017-09-30 LAB — TSH: TSH: 0.99 mIU/L

## 2017-09-30 MED ORDER — ESCITALOPRAM OXALATE 20 MG PO TABS: 20.0000 mg | ORAL_TABLET | Freq: Every day | ORAL | 3 refills | Status: DC

## 2017-09-30 NOTE — Assessment & Plan Note (Signed)
Well controlled Continue lexapro at current dose Check CMP F/u in 6 months

## 2017-09-30 NOTE — Progress Notes (Signed)
Patient: Virginia [name] Female    DOB: [birth year]   51 y.o.   MRN: [id number] Visit Date: 09/30/2017  Today's Provider: Lavon Paganini, MD   Chief Complaint  Patient presents with  . New Patient (Initial Visit)   Subjective:    HPI Pt is here to establish care. She reports that she is feeling well. She would just like to talk and meet her new provider today. She has a history of anorexia/bulmia that is controlled on the lexapro. She needs a refill on this and wants to make sure she can get it because she does not want to go without it. She reports that she has been doing well, every once in a while she will have an episode.  She is doing well with controlling her eating and weight.  Previously on Paxil, but that slowed her down.  Has been treated since she was in her early 53s.  Irregular bleeding: Previously had Mirena IUD to help with hormonal inteferences in eating disorder.  Started to have irregular bleeding. GYN diagnosed with fibroids.  Has been offered partial hysterectomy, not sure if she would like to do this yet.  Liked that she did not have periods on Mirena, but did not notice a difference in eating disorder control.   Depression screen [facility] 2/9 09/30/2017 09/30/2017  Decreased Interest 0 0  Down, Depressed, Hopeless 0 0  PHQ - 2 Score 0 0  Altered sleeping 0 -  Tired, decreased energy 1 -  Change in appetite 1 -  Feeling bad or failure about yourself  0 -  Trouble concentrating 0 -  Moving slowly or fidgety/restless 0 -  Suicidal thoughts 0 -  PHQ-9 Score 2 -  Difficult doing work/chores Not difficult at all -    GAD 7 : Generalized Anxiety Score 09/30/2017  Nervous, Anxious, on Edge 1  Control/stop worrying 0  Worry too much - different things 0  Trouble relaxing 0  Restless 0  Easily annoyed or irritable 0  Afraid - awful might happen 1  Total GAD 7 Score 2  Anxiety Difficulty Not difficult at all       No Known Allergies   Current  Outpatient Medications:  .  escitalopram (LEXAPRO) 20 MG tablet, Take 1 tablet (20 mg total) daily by mouth., Disp: 90 tablet, Rfl: 3  Review of Systems  Constitutional: Negative.   HENT: Negative.   Eyes: Negative.   Respiratory: Negative.   Cardiovascular: Negative.   Gastrointestinal: Negative.   Endocrine: Negative.   Genitourinary: Negative.   Musculoskeletal: Negative.   Skin: Negative.   Allergic/Immunologic: Negative.   Neurological: Negative.   Hematological: Negative.   Psychiatric/Behavioral: Negative.    Past Medical History:  Diagnosis Date  . Anorexia   . Anxiety   . Fibroids    Past Surgical History:  Procedure Laterality Date  . CLEFT PALATE REPAIR    . CLEFT PALATE REPAIR  1970   Family History  Problem Relation Age of Onset  . Anxiety disorder Mother   . Heart disease Father 50       CABG x4  . Diabetes Father   . Hypertension Father   . Hypothyroidism Father   . Anxiety disorder Daughter   . Heart disease Maternal Grandmother   . Stroke Maternal Grandmother 68  . Heart disease Paternal Aunt   . Stroke Paternal Aunt   . Heart disease Paternal Uncle   . Pancreatic cancer Paternal Uncle   .  Hypertension Sister   . Hyperlipidemia Sister   . Hypothyroidism Sister   . Lung cancer Maternal Uncle   . Colon cancer Neg Hx   . Breast cancer Neg Hx     Social History   Tobacco Use  . Smoking status: Never Smoker  . Smokeless tobacco: Never Used  Substance Use Topics  . Alcohol use: No    Alcohol/week: 0.0 oz   Objective:   BP 108/60 (BP Location: Left Arm, Patient Position: Sitting, Cuff Size: Normal)   Pulse 84   Temp 98.4 F (36.9 C) (Oral)   Resp 16   Ht 5\' 1"  (1.549 m)   Wt 138 lb (62.6 kg)   BMI 26.07 kg/m  Vitals:   09/30/17 1408  BP: 108/60  Pulse: 84  Resp: 16  Temp: 98.4 F (36.9 C)  TempSrc: Oral  Weight: 138 lb (62.6 kg)  Height: 5\' 1"  (1.549 m)     Physical Exam  Constitutional: She is oriented to person, place,  and time. She appears well-developed and well-nourished. No distress.  HENT:  Head: Normocephalic and atraumatic.  Right Ear: External ear normal.  Left Ear: External ear normal.  Nose: Nose normal.  Mouth/Throat: Oropharynx is clear and moist.  Eyes: Conjunctivae and EOM are normal. No scleral icterus.  Neck: Neck supple. No thyromegaly present.  Cardiovascular: Normal rate, regular rhythm, normal heart sounds and intact distal pulses.  No murmur heard. Pulmonary/Chest: Effort normal and breath sounds normal. No respiratory distress. She has no wheezes. She has no rales.  Abdominal: Soft. Bowel sounds are normal. She exhibits no distension. There is no tenderness. There is no rebound and no guarding.  Musculoskeletal: She exhibits no edema or deformity.  Lymphadenopathy:    She has no cervical adenopathy.  Neurological: She is alert and oriented to person, place, and time.  Skin: Skin is warm and dry. No rash noted.  Psychiatric: She has a normal mood and affect. Her behavior is normal.  Vitals reviewed.      Assessment & Plan:      Problem List Items Addressed This Visit      Other   Irregular bleeding    Managed by GYN Will check TSH to ensure this is not contributing      Relevant Orders   TSH   Fibroid    Managed by GYN Considering partial hysterectomy Recent CBC wnl      Disordered eating - Primary    Well controlled Continue lexapro at current dose Check CMP F/u in 6 months      Relevant Orders   Comprehensive metabolic panel   Avitaminosis D    Advised OTC supplement based on recent levels      Family history of thyroid disease    Check TSH      Relevant Orders   TSH       Return in about 6 months (around 03/30/2018).     The entirety of the information documented in the History of Present Illness, Review of Systems and Physical Exam were personally obtained by me. Portions of this information were initially documented by [place], [place]  and reviewed by me for thoroughness and accuracy.   Approximately 45 minutes was spent in discussion of which greater than 50% was consultation.    Lavon Paganini, MD  [facility]

## 2017-09-30 NOTE — Assessment & Plan Note (Signed)
Managed by GYN Considering partial hysterectomy Recent CBC wnl

## 2017-09-30 NOTE — Assessment & Plan Note (Signed)
Check TSH 

## 2017-09-30 NOTE — Patient Instructions (Signed)

## 2017-09-30 NOTE — Assessment & Plan Note (Signed)
Advised OTC supplement based on recent levels

## 2017-09-30 NOTE — Assessment & Plan Note (Signed)
Managed by GYN Will check TSH to ensure this is not contributing

## 2017-10-01 ENCOUNTER — Telehealth: Payer: Self-pay

## 2017-10-01 NOTE — Telephone Encounter (Signed)
-----   Message from Virginia Crews, MD sent at 10/01/2017  8:31 AM EST ----- Normal kidney function, liver function, electrolytes, Thyroid function.  Virginia Crews, MD, MPH [facility] 10/01/2017 8:31 AM

## 2017-10-01 NOTE — Telephone Encounter (Signed)
Pt advised of lab results.  

## 2017-11-12 ENCOUNTER — Telehealth: Payer: Self-pay

## 2017-11-12 NOTE — Telephone Encounter (Signed)
Pt states RPH had discussed possibility of using OCP's for cycle control. She is having very very heavy periods & would like to try this option. BU#[phone]

## 2017-11-13 ENCOUNTER — Other Ambulatory Visit: Payer: Self-pay | Admitting: Obstetrics & Gynecology

## 2017-11-13 MED ORDER — NORETHIN-ETH ESTRAD-FE BIPHAS 1 MG-10 MCG / 10 MCG PO TABS: 1.0000 | ORAL_TABLET | Freq: Every day | ORAL | 3 refills | Status: DC

## 2017-11-13 NOTE — Telephone Encounter (Signed)
D/w pt.  OCP eRx.  Fibroids and bleeding discussed. Pt is a non-smoker.  Pros and cons of meds discussed.

## 2017-11-19 MED ORDER — LEVONORGEST-ETH ESTRADIOL-IRON 0.1-20 MG-MCG(21) PO TABS: 1.0000 | ORAL_TABLET | Freq: Every day | ORAL | 11 refills | Status: DC

## 2017-11-19 NOTE — Telephone Encounter (Signed)
Tell her to come by for samples and coupon to keep Rx cost down to $21/month

## 2017-11-19 NOTE — Telephone Encounter (Signed)
Pt calling stating the [facility] sent in is $120. Pharmacy stated they sent a note over to [facility]. CB# [phone]

## 2017-11-19 NOTE — Telephone Encounter (Signed)
Can you change rx? Not sure if I have seen this note.

## 2017-11-22 MED FILL — ESCITALOPRAM 20 MG TABLET: 20 | 90 days supply | Qty: 90 | Fill #0

## 2017-12-07 NOTE — Telephone Encounter (Signed)
Elizabeth from Out patient pharmacy at [facility] calling about the patients Rx for birthcontrol. Insurance is requesting pt try 2 other generics. This has been pending x 3 weeks. Please call pharmacy back at [phone]

## 2017-12-08 NOTE — Telephone Encounter (Signed)
Can you change to generic?

## 2017-12-08 NOTE — Telephone Encounter (Signed)
You can change to Sprintec 1 po daily # 28 RF 11

## 2017-12-08 NOTE — Telephone Encounter (Signed)
Pt states she will try the samples that was given to her, and follow up if needed. Pharmacy aware

## 2018-01-20 ENCOUNTER — Ambulatory Visit (INDEPENDENT_AMBULATORY_CARE_PROVIDER_SITE_OTHER): Payer: 59 | Admitting: Family Medicine

## 2018-01-20 ENCOUNTER — Encounter: Payer: Self-pay | Admitting: Family Medicine

## 2018-01-20 VITALS — BP 90/58 | HR 75 | Temp 98.4°F | Resp 16 | Wt 138.0 lb

## 2018-01-20 DIAGNOSIS — J069 Acute upper respiratory infection, unspecified: Secondary | ICD-10-CM | POA: Diagnosis not present

## 2018-01-20 MED ORDER — HYDROCOD POLST-CPM POLST ER 10-8 MG/5ML PO SUER: 5.0000 mL | Freq: Two times a day (BID) | ORAL | 0 refills | Status: DC | PRN

## 2018-01-20 NOTE — Patient Instructions (Signed)

## 2018-01-20 NOTE — Progress Notes (Signed)
Patient: Virginia [name] Female    DOB: 1966-01-14   52 y.o.   MRN: [id number] Visit Date: 01/20/2018  Today's Provider: Lavon Paganini, MD   I, Martha Clan, [name], am acting as scribe for Lavon Paganini, MD.  Chief Complaint  Patient presents with  . URI   Subjective:    URI   This is a new problem. Episode onset: x 4 days. The problem has been gradually worsening. There has been no fever. Associated symptoms include abdominal pain, chest pain, congestion, coughing (productive), headaches, a plugged ear sensation (left more so than right), rhinorrhea, sinus pain (frontal), sneezing and a sore throat. Pertinent negatives include no ear pain, nausea, neck pain, swollen glands, vomiting or wheezing. Treatments tried: Mucinex, Sudafed, IBU. The treatment provided no relief.       No Known Allergies   Current Outpatient Medications:  .  escitalopram (LEXAPRO) 20 MG tablet, Take 1 tablet (20 mg total) daily by mouth., Disp: 90 tablet, Rfl: 3 .  chlorpheniramine-HYDROcodone (TUSSIONEX PENNKINETIC ER) 10-8 MG/5ML SUER, Take 5 mLs by mouth every 12 (twelve) hours as needed for cough., Disp: 115 mL, Rfl: 0  Review of Systems  HENT: Positive for congestion, rhinorrhea, sinus pain (frontal), sneezing and sore throat. Negative for ear pain.   Respiratory: Positive for cough (productive). Negative for wheezing.   Cardiovascular: Positive for chest pain.  Gastrointestinal: Positive for abdominal pain. Negative for nausea and vomiting.  Musculoskeletal: Negative for neck pain.  Neurological: Positive for headaches.    Social History   Tobacco Use  . Smoking status: Never Smoker  . Smokeless tobacco: Never Used  Substance Use Topics  . Alcohol use: No    Alcohol/week: 0.0 oz   Objective:   BP (!) 90/58 (BP Location: Left Arm, Patient Position: Sitting, Cuff Size: Normal)   Pulse 75   Temp 98.4 F (36.9 C) (Oral)   Resp 16   Wt 138 lb (62.6 kg)   LMP 12/21/2017    SpO2 99%   BMI 26.07 kg/m  Vitals:   01/20/18 1043  BP: (!) 90/58  Pulse: 75  Resp: 16  Temp: 98.4 F (36.9 C)  TempSrc: Oral  SpO2: 99%  Weight: 138 lb (62.6 kg)     Physical Exam  Constitutional: She is oriented to person, place, and time. She appears well-developed and well-nourished. No distress.  HENT:  Head: Normocephalic.  Right Ear: External ear and ear canal normal. Tympanic membrane is scarred.  Left Ear: External ear and ear canal normal. Tympanic membrane is scarred.  Nose: Mucosal edema present. Right sinus exhibits maxillary sinus tenderness and frontal sinus tenderness. Left sinus exhibits maxillary sinus tenderness and frontal sinus tenderness.  Mouth/Throat: Uvula is midline and mucous membranes are normal. Posterior oropharyngeal erythema present. No oropharyngeal exudate, posterior oropharyngeal edema or tonsillar abscesses.  Eyes: Conjunctivae and EOM are normal. Pupils are equal, round, and reactive to light. Right eye exhibits no discharge. Left eye exhibits no discharge. No scleral icterus.  Neck: Neck supple. No thyromegaly present.  Cardiovascular: Normal rate, regular rhythm, normal heart sounds and intact distal pulses.  No murmur heard. Pulmonary/Chest: Effort normal and breath sounds normal. No respiratory distress. She has no wheezes. She has no rales.  Abdominal: Soft. She exhibits no distension. There is no tenderness.  Musculoskeletal: She exhibits no edema.  Lymphadenopathy:    She has no cervical adenopathy.  Neurological: She is alert and oriented to person, place, and time.  Skin:  Skin is warm and dry. No rash noted.  Psychiatric: She has a normal mood and affect. Her behavior is normal.  Vitals reviewed.       Assessment & Plan:      1. Viral URI - No evidence of bacterial process (CAP, AOM, strep pharngitis) on exam - symptoms consistent with viral URI - could have early sinusitis - may call back 3/11 if not improved and consider  starting Augmentin -Discussed natural course, symptomatic management (Mucinex, ibuprofen/Tylenol, Sudafed, Flonase or nasal rinses), return precautions - cough syrup for bedtime given   Meds ordered this encounter  Medications  . chlorpheniramine-HYDROcodone (TUSSIONEX PENNKINETIC ER) 10-8 MG/5ML SUER    Sig: Take 5 mLs by mouth every 12 (twelve) hours as needed for cough.    Dispense:  115 mL    Refill:  0     Return if symptoms worsen or fail to improve.   The entirety of the information documented in the History of Present Illness, Review of Systems and Physical Exam were personally obtained by me. Portions of this information were initially documented by Raquel Sarna Ratchford, [name] and reviewed by me for thoroughness and accuracy.    Virginia Crews, MD, MPH [facility] 01/20/2018 11:47 AM

## 2018-01-24 ENCOUNTER — Telehealth: Payer: Self-pay | Admitting: Family Medicine

## 2018-01-24 MED ORDER — AMOXICILLIN-POT CLAVULANATE 875-125 MG PO TABS: 1.0000 | ORAL_TABLET | Freq: Two times a day (BID) | ORAL | 0 refills | Status: DC

## 2018-01-24 MED FILL — AMOXICILLIN-POT CLAVULANATE: 875-125 | 7 days supply | Qty: 14 | Fill #0

## 2018-01-24 NOTE — Telephone Encounter (Signed)
Patient called to say she is not any better from when she was in on Thursday.    Coughing, congestion worse than on Thursday, it's tighter in chest/head.   Would like antibiotic called in . [facility] [phone]

## 2018-01-24 NOTE — Telephone Encounter (Signed)
7d course of Augmentin sent to [facility].  Virginia Crews, MD, MPH [facility] 01/24/2018 1:53 PM

## 2018-01-24 NOTE — Telephone Encounter (Signed)
Pt advised.

## 2018-03-30 ENCOUNTER — Encounter: Payer: Self-pay | Admitting: Family Medicine

## 2018-03-30 ENCOUNTER — Ambulatory Visit (INDEPENDENT_AMBULATORY_CARE_PROVIDER_SITE_OTHER): Payer: 59 | Admitting: Family Medicine

## 2018-03-30 VITALS — BP 100/72 | HR 73 | Temp 98.0°F | Resp 16 | Ht 61.0 in | Wt 136.0 lb

## 2018-03-30 DIAGNOSIS — Z0000 Encounter for general adult medical examination without abnormal findings: Secondary | ICD-10-CM | POA: Diagnosis not present

## 2018-03-30 MED FILL — ESCITALOPRAM 20 MG TABLET: 20 | 90 days supply | Qty: 90 | Fill #1

## 2018-03-30 NOTE — Progress Notes (Signed)
Patient: Virginia [name], Female    DOB: 1966-04-28, 52 y.o.   MRN: [id number] Visit Date: 03/30/2018  Today's Provider: Lavon Paganini, MD   I, Martha Clan, [name], am acting as scribe for Lavon Paganini, MD.  Chief Complaint  Patient presents with  . Annual Exam   Subjective:    Annual physical exam Virginia [name] is a 52 y.o. female who presents today for health maintenance and complete physical. She feels well. She is requesting hormone levels to be checked, as she her LMP was 2 months ago.   She reports exercising daily. Tries to get 8,000-10,000 steps per day. She reports she is sleeping fairly well. She states that hip pain causes sleep difficulty.  Last pap- 06/09/2017- NIL. HPV negative. Pt states she received mammogram from [place]. Will request records. She is due for colon cancer screening. -----------------------------------------------------------------   Review of Systems  Constitutional: Positive for diaphoresis. Negative for activity change, appetite change, chills, fatigue, fever and unexpected weight change.  HENT: Negative.   Eyes: Negative.   Respiratory: Negative.   Cardiovascular: Negative.   Gastrointestinal: Negative.   Endocrine: Negative.   Genitourinary: Negative.   Musculoskeletal: Positive for arthralgias. Negative for back pain, gait problem, joint swelling, myalgias, neck pain and neck stiffness.  Skin: Negative.   Allergic/Immunologic: Negative.   Neurological: Negative.   Hematological: Negative.   Psychiatric/Behavioral: Negative.     Social History      She  reports that she has never smoked. She has never used smokeless tobacco. She reports that she does not drink alcohol or use drugs.       Social History   Socioeconomic History  . Marital status: Married    Spouse name: Not on file  . Number of children: 2  . Years of education: Not on file  . Highest education level: Not on file  Occupational History    . Occupation: works on [employer]  Social Needs  . Financial resource strain: Not on file  . Food insecurity:    Worry: Not on file    Inability: Not on file  . Transportation needs:    Medical: Not on file    Non-medical: Not on file  Tobacco Use  . Smoking status: Never Smoker  . Smokeless tobacco: Never Used  Substance and Sexual Activity  . Alcohol use: No    Alcohol/week: 0.0 oz  . Drug use: No  . Sexual activity: Yes    Partners: Male    Birth control/protection: Surgical    Comment: husband s/p vasectomy  Lifestyle  . Physical activity:    Days per week: Not on file    Minutes per session: Not on file  . Stress: Not on file  Relationships  . Social connections:    Talks on phone: Not on file    Gets together: Not on file    Attends religious service: Not on file    Active member of club or organization: Not on file    Attends meetings of clubs or organizations: Not on file    Relationship status: Not on file  Other Topics Concern  . Not on file  Social History Narrative  . Not on file    Past Medical History:  Diagnosis Date  . Anorexia   . Anxiety   . Fibroids      Patient Active Problem List   Diagnosis Date Noted  . Disordered eating 09/30/2017  . Avitaminosis D  09/30/2017  . Family history of thyroid disease 09/30/2017  . Fibroid 08/31/2017  . Irregular bleeding 08/19/2017  . Arthralgia of hip 10/07/2015    Past Surgical History:  Procedure Laterality Date  . CLEFT PALATE REPAIR    . [place]    Family History        Family Status  Relation Name Status  . Mother  Alive  . Father  Alive  . Daughter  Alive  . MGM  (Not Specified)  . Ethlyn Daniels  (Not Specified)  . Annamarie Major  (Not Specified)  . Sister  (Not Specified)  . Sister  (Not Specified)  . Mat Uncle  (Not Specified)  . Neg Hx  (Not Specified)        Her family history includes Anxiety disorder in her daughter and mother; Diabetes in her father;  Heart disease in her maternal grandmother, paternal aunt, and paternal uncle; Heart disease (age of onset: 42) in her father; Hyperlipidemia in her sister; Hypertension in her father and sister; Hypothyroidism in her father and sister; Lung cancer in her maternal uncle; Pancreatic cancer in her paternal uncle; Stroke in her paternal aunt; Stroke (age of onset: 62) in her maternal grandmother. There is no history of Colon cancer or Breast cancer.      No Known Allergies   Current Outpatient Medications:  .  escitalopram (LEXAPRO) 20 MG tablet, Take 1 tablet (20 mg total) daily by mouth., Disp: 90 tablet, Rfl: 3 .  ibuprofen (ADVIL,MOTRIN) 100 MG tablet, Take 200 mg by mouth every 6 (six) hours as needed for fever., Disp: , Rfl:  .  naproxen sodium (ALEVE) 220 MG tablet, Take 220 mg by mouth daily., Disp: , Rfl:    Patient Care Team: Virginia Crews, MD as PCP - General (Family Medicine)      Objective:   Vitals: BP 100/72 (BP Location: Left Arm, Patient Position: Sitting, Cuff Size: Normal)   Pulse 73   Temp 98 F (36.7 C) (Oral)   Resp 16   Ht 5\' 1"  (1.549 m)   Wt 136 lb (61.7 kg)   LMP 01/28/2018   SpO2 98%   BMI 25.70 kg/m    Vitals:   03/30/18 0912  BP: 100/72  Pulse: 73  Resp: 16  Temp: 98 F (36.7 C)  TempSrc: Oral  SpO2: 98%  Weight: 136 lb (61.7 kg)  Height: 5\' 1"  (1.549 m)     Physical Exam  Constitutional: She is oriented to person, place, and time. She appears well-developed and well-nourished. No distress.  HENT:  Head: Normocephalic and atraumatic.  Right Ear: External ear normal.  Left Ear: External ear normal.  Nose: Nose normal.  Mouth/Throat: Oropharynx is clear and moist.  Eyes: Pupils are equal, round, and reactive to light. Conjunctivae and EOM are normal. No scleral icterus.  Neck: Neck supple. No thyromegaly present.  Cardiovascular: Normal rate, regular rhythm, normal heart sounds and intact distal pulses.  No murmur  heard. Pulmonary/Chest: Effort normal and breath sounds normal. No respiratory distress. She has no wheezes. She has no rales.  Abdominal: Soft. Bowel sounds are normal. She exhibits no distension. There is no tenderness. There is no rebound and no guarding.  Musculoskeletal: She exhibits no edema or deformity.  Lymphadenopathy:    She has no cervical adenopathy.  Neurological: She is alert and oriented to person, place, and time.  Skin: Skin is warm and dry. Capillary refill takes less than 2 seconds. No  rash noted.  Psychiatric: She has a normal mood and affect. Her behavior is normal.  Vitals reviewed.    Depression Screen PHQ 2/9 Scores 09/30/2017 09/30/2017  PHQ - 2 Score 0 0  PHQ- 9 Score 2 -     Assessment & Plan:     Routine Health Maintenance and Physical Exam  Exercise Activities and Dietary recommendations Goals    None      Immunization History  Administered Date(s) Administered  . Influenza-Unspecified 08/08/2015  . Tdap 11/06/2010    Health Maintenance  Topic Date Due  . HIV Screening  01/24/1983  . MAMMOGRAM  01/23/2018  . COLONOSCOPY  01/23/2018  . INFLUENZA VACCINE  06/16/2018  . PAP SMEAR  06/09/2020  . TETANUS/TDAP  11/06/2020     Discussed health benefits of physical activity, and encouraged her to engage in regular exercise appropriate for her age and condition.    --------------------------------------------------------------------  The entirety of the information documented in the History of Present Illness, Review of Systems and Physical Exam were personally obtained by me. Portions of this information were initially documented by Raquel Sarna Ratchford, [name] and reviewed by me for thoroughness and accuracy.    Virginia Crews, MD, MPH [facility] 03/30/2018 10:28 AM

## 2018-03-30 NOTE — Patient Instructions (Addendum)
Call [facility] about rescheduling colonoscopy   Preventive Care 40-64 Years, Female Preventive care refers to lifestyle choices and visits with your health care provider that can promote health and wellness. What does preventive care include?  A yearly physical exam. This is also called an annual well check.  Dental exams once or twice a year.  Routine eye exams. Ask your health care provider how often you should have your eyes checked.  Personal lifestyle choices, including: ? Daily care of your teeth and gums. ? Regular physical activity. ? Eating a healthy diet. ? Avoiding tobacco and drug use. ? Limiting alcohol use. ? Practicing safe sex. ? Taking low-dose aspirin daily starting at age 30. ? Taking vitamin and mineral supplements as recommended by your health care provider. What happens during an annual well check? The services and screenings done by your health care provider during your annual well check will depend on your age, overall health, lifestyle risk factors, and family history of disease. Counseling Your health care provider may ask you questions about your:  Alcohol use.  Tobacco use.  Drug use.  Emotional well-being.  Home and relationship well-being.  Sexual activity.  Eating habits.  Work and work Statistician.  Method of birth control.  Menstrual cycle.  Pregnancy history.  Screening You may have the following tests or measurements:  Height, weight, and BMI.  Blood pressure.  Lipid and cholesterol levels. These may be checked every 5 years, or more frequently if you are over 10 years old.  Skin check.  Lung cancer screening. You may have this screening every year starting at age 29 if you have a 30-pack-year history of smoking and currently smoke or have quit within the past 15 years.  Fecal occult blood test (FOBT) of the stool. You may have this test every year starting at age 44.  Flexible sigmoidoscopy or colonoscopy. You may  have a sigmoidoscopy every 5 years or a colonoscopy every 10 years starting at age 71.  Hepatitis C blood test.  Hepatitis B blood test.  Sexually transmitted disease (STD) testing.  Diabetes screening. This is done by checking your blood sugar (glucose) after you have not eaten for a while (fasting). You may have this done every 1-3 years.  Mammogram. This may be done every 1-2 years. Talk to your health care provider about when you should start having regular mammograms. This may depend on whether you have a family history of breast cancer.  BRCA-related cancer screening. This may be done if you have a family history of breast, ovarian, tubal, or peritoneal cancers.  Pelvic exam and Pap test. This may be done every 3 years starting at age 1. Starting at age 49, this may be done every 5 years if you have a Pap test in combination with an HPV test.  Bone density scan. This is done to screen for osteoporosis. You may have this scan if you are at high risk for osteoporosis.  Discuss your test results, treatment options, and if necessary, the need for more tests with your health care provider. Vaccines Your health care provider may recommend certain vaccines, such as:  Influenza vaccine. This is recommended every year.  Tetanus, diphtheria, and acellular pertussis (Tdap, Td) vaccine. You may need a Td booster every 10 years.  Varicella vaccine. You may need this if you have not been vaccinated.  Zoster vaccine. You may need this after age 48.  Measles, mumps, and rubella (MMR) vaccine. You may need at least one  dose of MMR if you were born in 1957 or later. You may also need a second dose.  Pneumococcal 13-valent conjugate (PCV13) vaccine. You may need this if you have certain conditions and were not previously vaccinated.  Pneumococcal polysaccharide (PPSV23) vaccine. You may need one or two doses if you smoke cigarettes or if you have certain conditions.  Meningococcal vaccine.  You may need this if you have certain conditions.  Hepatitis A vaccine. You may need this if you have certain conditions or if you travel or work in places where you may be exposed to hepatitis A.  Hepatitis B vaccine. You may need this if you have certain conditions or if you travel or work in places where you may be exposed to hepatitis B.  Haemophilus influenzae type b (Hib) vaccine. You may need this if you have certain conditions.  Talk to your health care provider about which screenings and vaccines you need and how often you need them. This information is not intended to replace advice given to you by your health care provider. Make sure you discuss any questions you have with your health care provider. Document Released: 11/29/2015 Document Revised: 07/22/2016 Document Reviewed: 09/03/2015 Elsevier Interactive Patient Education  [employer].

## 2018-04-01 ENCOUNTER — Other Ambulatory Visit: Payer: Self-pay

## 2018-04-01 DIAGNOSIS — Z1211 Encounter for screening for malignant neoplasm of colon: Secondary | ICD-10-CM

## 2018-04-01 MED ORDER — NA SULFATE-K SULFATE-MG SULF 17.5-3.13-1.6 GM/177ML PO SOLN: 1.0000 | Freq: Once | ORAL | 0 refills | Status: AC

## 2018-04-01 MED FILL — SUPREP BOWEL PREP KIT: 17.5-3.13-1 | 1 days supply | Qty: 354 | Fill #0

## 2018-04-22 ENCOUNTER — Other Ambulatory Visit: Payer: Self-pay

## 2018-04-22 ENCOUNTER — Telehealth: Payer: Self-pay

## 2018-04-22 NOTE — Telephone Encounter (Signed)
Patient requested colonoscopy instructions to be faxed to her at [phone].  I contacted her to let her know I sent her instructions to her via mychart prior to calling her.  Advised if she did not receive she can call me back and I will fax to the number she has provided.  She states she has her bowel prep.  Thanks [employer]

## 2018-04-22 NOTE — Discharge Instructions (Signed)
General Anesthesia, Adult, Care After °These instructions provide you with information about caring for yourself after your procedure. Your health care provider may also give you more specific instructions. Your treatment has been planned according to current medical practices, but problems sometimes occur. Call your health care provider if you have any problems or questions after your procedure. °What can I expect after the procedure? °After the procedure, it is common to have: °· Vomiting. °· A sore throat. °· Mental slowness. ° °It is common to feel: °· Nauseous. °· Cold or shivery. °· Sleepy. °· Tired. °· Sore or achy, even in parts of your body where you did not have surgery. ° °Follow these instructions at home: °For at least 24 hours after the procedure: °· Do not: °? Participate in activities where you could fall or become injured. °? Drive. °? Use heavy machinery. °? Drink alcohol. °? Take sleeping pills or medicines that cause drowsiness. °? Make important decisions or sign legal documents. °? Take care of children on your own. °· Rest. °Eating and drinking °· If you vomit, drink water, juice, or soup when you can drink without vomiting. °· Drink enough fluid to keep your urine clear or pale yellow. °· Make sure you have little or no nausea before eating solid foods. °· Follow the diet recommended by your health care provider. °General instructions °· Have a responsible adult stay with you until you are awake and alert. °· Return to your normal activities as told by your health care provider. Ask your health care provider what activities are safe for you. °· Take over-the-counter and prescription medicines only as told by your health care provider. °· If you smoke, do not smoke without supervision. °· Keep all follow-up visits as told by your health care provider. This is important. °Contact a health care provider if: °· You continue to have nausea or vomiting at home, and medicines are not helpful. °· You  cannot drink fluids or start eating again. °· You cannot urinate after 8-12 hours. °· You develop a skin rash. °· You have fever. °· You have increasing redness at the site of your procedure. °Get help right away if: °· You have difficulty breathing. °· You have chest pain. °· You have unexpected bleeding. °· You feel that you are having a life-threatening or urgent problem. °This information is not intended to replace advice given to you by your health care provider. Make sure you discuss any questions you have with your health care provider. °Document Released: 02/08/2001 Document Revised: 04/06/2016 Document Reviewed: 10/17/2015 °Elsevier Interactive Patient Education © 2018 Elsevier Inc. ° °

## 2018-04-25 ENCOUNTER — Ambulatory Visit: Payer: 59 | Admitting: Anesthesiology

## 2018-04-25 ENCOUNTER — Ambulatory Visit
Admission: RE | Admit: 2018-04-25 | Discharge: 2018-04-25 | Disposition: A | Discharge status: Home / Self Care | Payer: 59 | Source: Ambulatory Visit | Attending: Gastroenterology | Admitting: Gastroenterology

## 2018-04-25 ENCOUNTER — Encounter
Admission: RE | Disposition: A | Discharge status: Home / Self Care | Payer: Self-pay | Source: Ambulatory Visit | Attending: Gastroenterology

## 2018-04-25 DIAGNOSIS — Z79899 Other long term (current) drug therapy: Secondary | ICD-10-CM | POA: Insufficient documentation

## 2018-04-25 DIAGNOSIS — M170 Bilateral primary osteoarthritis of knee: Secondary | ICD-10-CM | POA: Diagnosis not present

## 2018-04-25 DIAGNOSIS — Z833 Family history of diabetes mellitus: Secondary | ICD-10-CM | POA: Diagnosis not present

## 2018-04-25 DIAGNOSIS — Z1211 Encounter for screening for malignant neoplasm of colon: Secondary | ICD-10-CM | POA: Diagnosis not present

## 2018-04-25 DIAGNOSIS — F419 Anxiety disorder, unspecified: Secondary | ICD-10-CM | POA: Insufficient documentation

## 2018-04-25 DIAGNOSIS — R630 Anorexia: Secondary | ICD-10-CM | POA: Insufficient documentation

## 2018-04-25 DIAGNOSIS — Z9889 Other specified postprocedural states: Secondary | ICD-10-CM | POA: Diagnosis not present

## 2018-04-25 DIAGNOSIS — Z800 Family history of malignant neoplasm of digestive organs: Secondary | ICD-10-CM | POA: Insufficient documentation

## 2018-04-25 DIAGNOSIS — Z818 Family history of other mental and behavioral disorders: Secondary | ICD-10-CM | POA: Insufficient documentation

## 2018-04-25 DIAGNOSIS — Z823 Family history of stroke: Secondary | ICD-10-CM | POA: Diagnosis not present

## 2018-04-25 DIAGNOSIS — Z8249 Family history of ischemic heart disease and other diseases of the circulatory system: Secondary | ICD-10-CM | POA: Diagnosis not present

## 2018-04-25 DIAGNOSIS — Z6825 Body mass index (BMI) 25.0-25.9, adult: Secondary | ICD-10-CM | POA: Diagnosis not present

## 2018-04-25 DIAGNOSIS — Z801 Family history of malignant neoplasm of trachea, bronchus and lung: Secondary | ICD-10-CM | POA: Insufficient documentation

## 2018-04-25 HISTORY — PX: COLONOSCOPY WITH PROPOFOL: SHX5780

## 2018-04-25 HISTORY — DX: Presence of spectacles and contact lenses: Z97.3

## 2018-04-25 HISTORY — DX: Unspecified osteoarthritis, unspecified site: M19.90

## 2018-04-25 SURGERY — COLONOSCOPY WITH PROPOFOL
Anesthesia: General | Site: Rectum | Wound class: Contaminated

## 2018-04-25 MED ORDER — STERILE WATER FOR IRRIGATION IR SOLN
Status: DC | PRN
  Administered 2018-04-25: .5 mL

## 2018-04-25 MED ORDER — LIDOCAINE HCL (CARDIAC) PF 100 MG/5ML IV SOSY
PREFILLED_SYRINGE | INTRAVENOUS | Status: DC | PRN
  Administered 2018-04-25: 40 mg via INTRAVENOUS

## 2018-04-25 MED ORDER — ACETAMINOPHEN 325 MG PO TABS: 325.0000 mg | ORAL_TABLET | Freq: Once | ORAL | Status: DC

## 2018-04-25 MED ORDER — PROPOFOL 10 MG/ML IV BOLUS
INTRAVENOUS | Status: DC | PRN
  Administered 2018-04-25 (×4): 20 mg via INTRAVENOUS
  Administered 2018-04-25: 100 mg via INTRAVENOUS
  Administered 2018-04-25 (×4): 20 mg via INTRAVENOUS

## 2018-04-25 MED ORDER — ACETAMINOPHEN 160 MG/5ML PO SOLN
325.0000 mg | Freq: Once | ORAL | Status: DC
Start: 2018-04-25 — End: 2018-04-25

## 2018-04-25 MED ORDER — LACTATED RINGERS IV SOLN
INTRAVENOUS | Status: DC
  Administered 2018-04-25: 10:00:00 via INTRAVENOUS

## 2018-04-25 MED ORDER — LACTATED RINGERS IV SOLN: 10.0000 mL/h | INTRAVENOUS | Status: DC

## 2018-04-25 SURGICAL SUPPLY — 5 items
CANISTER SUCT 1200ML W/VALVE (MISCELLANEOUS) ×2 IMPLANT
GOWN CVR UNV OPN BCK APRN NK (MISCELLANEOUS) ×2 IMPLANT
GOWN ISOL THUMB LOOP REG UNIV (MISCELLANEOUS) ×2
KIT ENDO PROCEDURE OLY (KITS) ×2 IMPLANT
WATER STERILE IRR 250ML POUR (IV SOLUTION) ×2 IMPLANT

## 2018-04-25 NOTE — Anesthesia Preprocedure Evaluation (Signed)
Anesthesia Evaluation  Patient identified by MRN, date of birth, ID band Patient awake    Reviewed: Allergy & Precautions, H&P , NPO status , Patient's Chart, lab work & pertinent test results  Airway Mallampati: II  TM Distance: >3 FB Neck ROM: full    Dental no notable dental hx.    Pulmonary    Pulmonary exam normal breath sounds clear to auscultation       Cardiovascular Normal cardiovascular exam Rhythm:regular Rate:Normal     Neuro/Psych PSYCHIATRIC DISORDERS    GI/Hepatic   Endo/Other    Renal/GU      Musculoskeletal   Abdominal   Peds  Hematology   Anesthesia Other Findings   Reproductive/Obstetrics                             Anesthesia Physical Anesthesia Plan  ASA: II  Anesthesia Plan: General   Post-op Pain Management:    Induction: Intravenous  PONV Risk Score and Plan: 3 and Treatment may vary due to age or medical condition and Propofol infusion  Airway Management Planned: Natural Airway  Additional Equipment:   Intra-op Plan:   Post-operative Plan:   Informed Consent: I have reviewed the patients History and Physical, chart, labs and discussed the procedure including the risks, benefits and alternatives for the proposed anesthesia with the patient or authorized representative who has indicated his/her understanding and acceptance.     Plan Discussed with: CRNA  Anesthesia Plan Comments:         Anesthesia Quick Evaluation

## 2018-04-25 NOTE — Anesthesia Postprocedure Evaluation (Signed)
Anesthesia Post Note  Patient: Virginia [name]  Procedure(s) Performed: COLONOSCOPY WITH PROPOFOL (N/A Rectum)  Patient location during evaluation: PACU Anesthesia Type: General Level of consciousness: awake and alert and oriented Pain management: satisfactory to patient Vital Signs Assessment: post-procedure vital signs reviewed and stable Respiratory status: spontaneous breathing, nonlabored ventilation and respiratory function stable Cardiovascular status: blood pressure returned to baseline and stable Postop Assessment: Adequate PO intake and No signs of nausea or vomiting Anesthetic complications: no    Raliegh Ip

## 2018-04-25 NOTE — Transfer of Care (Signed)
Immediate Anesthesia Transfer of Care Note  Patient: Virginia [name]  Procedure(s) Performed: COLONOSCOPY WITH PROPOFOL (N/A Rectum)  Patient Location: PACU  Anesthesia Type: General  Level of Consciousness: awake, alert  and patient cooperative  Airway and Oxygen Therapy: Patient Spontanous Breathing and Patient connected to supplemental oxygen  Post-op Assessment: Post-op Vital signs reviewed, Patient's Cardiovascular Status Stable, Respiratory Function Stable, Patent Airway and No signs of Nausea or vomiting  Post-op Vital Signs: Reviewed and stable  Complications: No apparent anesthesia complications

## 2018-04-25 NOTE — H&P (Signed)
Virginia Lame, MD [facility] [address] [place], [place] [address] Phone: [phone] Fax : [phone]  Primary Care Physician:  Virginia Crews, MD Primary Gastroenterologist:  Dr. Allen Norris  Pre-Procedure History & Physical: HPI:  Virginia [name] is a 52 y.o. female is here for a screening colonoscopy.   Past Medical History:  Diagnosis Date  . Anorexia   . Anxiety   . Arthritis    knees  . Fibroids   . Wears glasses     Past Surgical History:  Procedure Laterality Date  . CLEFT PALATE REPAIR    . [place]    Prior to Admission medications   Medication Sig Start Date End Date Taking? Authorizing Provider  escitalopram (LEXAPRO) 20 MG tablet Take 1 tablet (20 mg total) daily by mouth. 09/30/17  Yes Bacigalupo, Dionne Bucy, MD  ibuprofen (ADVIL,MOTRIN) 100 MG tablet Take 200 mg by mouth every 6 (six) hours as needed for fever.   Yes [provider]  naproxen sodium (ALEVE) 220 MG tablet Take 220 mg by mouth daily.   Yes [provider]    Allergies as of 04/01/2018  . (No Known Allergies)    Family History  Problem Relation Age of Onset  . Anxiety disorder Mother   . Heart disease Father 3       CABG x4  . Diabetes Father   . Hypertension Father   . Hypothyroidism Father   . Anxiety disorder Daughter   . Heart disease Maternal Grandmother   . Stroke Maternal Grandmother 68  . Heart disease Paternal Aunt   . Stroke Paternal Aunt   . Heart disease Paternal Uncle   . Pancreatic cancer Paternal Uncle   . Hypertension Sister   . Hyperlipidemia Sister   . Hypothyroidism Sister   . Lung cancer Maternal Uncle   . Healthy Son   . Colon cancer Neg Hx   . Breast cancer Neg Hx   . Ovarian cancer Neg Hx   . Cervical cancer Neg Hx     Social History   Socioeconomic History  . Marital status: Married    Spouse name: Luretha Rued  . Number of children: 2  . Years of education: 16  . Highest education level:  Bachelor's degree (e.g., BA, AB, BS)  Occupational History  . Occupation: works on [employer]  Social Needs  . Financial resource strain: Not hard at all  . Food insecurity:    Worry: Never true    Inability: Never true  . Transportation needs:    Medical: No    Non-medical: No  Tobacco Use  . Smoking status: Never Smoker  . Smokeless tobacco: Never Used  Substance and Sexual Activity  . Alcohol use: No    Alcohol/week: 0.0 oz  . Drug use: No  . Sexual activity: Yes    Partners: Male    Birth control/protection: Surgical    Comment: husband s/p vasectomy  Lifestyle  . Physical activity:    Days per week: Not on file    Minutes per session: Not on file  . Stress: Not at all  Relationships  . Social connections:    Talks on phone: Not on file    Gets together: Not on file    Attends religious service: Not on file    Active member of club or organization: Not on file    Attends meetings of clubs or organizations: Not on file    Relationship  status: Not on file  . Intimate partner violence:    Fear of current or ex partner: Not on file    Emotionally abused: Not on file    Physically abused: Not on file    Forced sexual activity: Not on file  Other Topics Concern  . Not on file  Social History Narrative  . Not on file    Review of Systems: See HPI, otherwise negative ROS  Physical Exam: BP (!) 88/62   Pulse 75   Temp (!) 97.5 F (36.4 C) (Temporal)   Resp 16   Ht 5\' 1"  (1.549 m)   Wt 135 lb (61.2 kg)   LMP 01/17/2018 (Approximate)   SpO2 100%   BMI 25.51 kg/m  General:   Alert,  pleasant and cooperative in NAD Head:  Normocephalic and atraumatic. Neck:  Supple; no masses or thyromegaly. Lungs:  Clear throughout to auscultation.    Heart:  Regular rate and rhythm. Abdomen:  Soft, nontender and nondistended. Normal bowel sounds, without guarding, and without rebound.   Neurologic:  Alert and  oriented x4;  grossly normal  neurologically.  Impression/Plan: ABIGAYL HOR is now here to undergo a screening colonoscopy.  Risks, benefits, and alternatives regarding colonoscopy have been reviewed with the patient.  Questions have been answered.  All parties agreeable.

## 2018-04-25 NOTE — Anesthesia Procedure Notes (Signed)
Procedure Name: MAC Date/Time: 04/25/2018 10:26 AM Performed by: Janna Arch, CRNA Pre-anesthesia Checklist: Patient identified, Emergency Drugs available, Suction available and Patient being monitored Patient Re-evaluated:Patient Re-evaluated prior to induction Oxygen Delivery Method: Nasal cannula

## 2018-04-25 NOTE — Op Note (Signed)
[facility] Gastroenterology Patient Name: Virginia [name] Procedure Date: 04/25/2018 10:27 AM MRN: [id number] Account #: [id number] Date of Birth: 18-Dec-1965 Admit Type: Outpatient Age: 52 Room: [facility] OR ROOM 01 Gender: Female Note Status: Finalized Procedure:            Colonoscopy Indications:          Screening for colorectal malignant neoplasm Providers:            [name] MD, MD Referring MD:         Dionne Bucy. Bacigalupo (Referring MD) Medicines:            Propofol per Anesthesia Complications:        No immediate complications. Procedure:            Pre-Anesthesia Assessment:                       - Prior to the procedure, a History and Physical was                        performed, and patient medications and allergies were                        reviewed. The patient's tolerance of previous                        anesthesia was also reviewed. The risks and benefits of                        the procedure and the sedation options and risks were                        discussed with the patient. All questions were                        answered, and informed consent was obtained. Prior                        Anticoagulants: The patient has taken no previous                        anticoagulant or antiplatelet agents. ASA Grade                        Assessment: II - A patient with mild systemic disease.                        After reviewing the risks and benefits, the patient was                        deemed in satisfactory condition to undergo the                        procedure.                       After obtaining informed consent, the colonoscope was                        passed under direct vision. Throughout the procedure,  the patient's blood pressure, pulse, and oxygen                        saturations were monitored continuously. The [place] [id number]) was introduced through  the                        anus and advanced to the the cecum, identified by                        appendiceal orifice and ileocecal valve. The                        colonoscopy was performed without difficulty. The                        patient tolerated the procedure well. The quality of                        the bowel preparation was excellent. Findings:      The perianal and digital rectal examinations were normal.      The colon (entire examined portion) appeared normal. Impression:           - The entire examined colon is normal.                       - No specimens collected. Recommendation:       - Discharge patient to home.                       - Resume previous diet.                       - Continue present medications.                       - Repeat colonoscopy in 10 years for screening unless                        any change in family history or lower GI problems. Procedure Code(s):    --- Professional ---                       [phone], Colonoscopy, flexible; diagnostic, including                        collection of specimen(s) by brushing or washing, when                        performed (separate procedure) Diagnosis Code(s):    --- Professional ---                       Z12.11, Encounter for screening for malignant neoplasm                        of colon CPT copyright 2017 American Medical Association. All rights reserved. The codes documented in this report are preliminary and upon coder review may  be revised to meet current compliance requirements. [name] MD,  MD 04/25/2018 10:51:39 AM This report has been signed electronically. Number of Addenda: 0 Note Initiated On: 04/25/2018 10:27 AM Scope Withdrawal Time: 0 hours 8 minutes 2 seconds  Total Procedure Duration: 0 hours 18 minutes 33 seconds       [facility]

## 2018-04-26 ENCOUNTER — Encounter: Payer: Self-pay | Admitting: Gastroenterology

## 2018-05-13 ENCOUNTER — Encounter: Payer: Self-pay | Admitting: Physician Assistant

## 2018-05-13 ENCOUNTER — Ambulatory Visit (INDEPENDENT_AMBULATORY_CARE_PROVIDER_SITE_OTHER): Payer: 59 | Admitting: Physician Assistant

## 2018-05-13 VITALS — BP 110/78 | HR 78 | Temp 98.3°F | Resp 16 | Ht 61.0 in | Wt 138.0 lb

## 2018-05-13 DIAGNOSIS — H6983 Other specified disorders of Eustachian tube, bilateral: Secondary | ICD-10-CM | POA: Diagnosis not present

## 2018-05-13 NOTE — Progress Notes (Signed)
Patient: Virginia [name] Female    DOB: 1966-02-29   52 y.o.   MRN: [id number] Visit Date: 05/13/2018  Today's Provider: Mar Daring, PA-C   Chief Complaint  Patient presents with  . Otitis Media   Subjective:    HPI Patient here today C/O bilateral ear pain since Saturday. Patient reports she did a E-visit on Sunday and was started on Augmentin. Patient reports she is still having ear pain left more than right. Patient reports clear discharge. Patient reports she is taking an OTC sinus medication (cold.     No Known Allergies   Current Outpatient Medications:  .  amoxicillin-clavulanate (AUGMENTIN) 875-125 MG tablet, Take 1 tablet by mouth 2 (two) times daily. , Disp: , Rfl:  .  escitalopram (LEXAPRO) 20 MG tablet, Take 1 tablet (20 mg total) daily by mouth., Disp: 90 tablet, Rfl: 3 .  ibuprofen (ADVIL,MOTRIN) 100 MG tablet, Take 200 mg by mouth every 6 (six) hours as needed for fever., Disp: , Rfl:  .  naproxen sodium (ALEVE) 220 MG tablet, Take 220 mg by mouth daily., Disp: , Rfl:   Review of Systems  Constitutional: Negative.   HENT: Positive for ear discharge and ear pain. Negative for postnasal drip, sinus pressure and sinus pain.   Respiratory: Negative.   Cardiovascular: Negative.   Neurological: Negative.     Social History   Tobacco Use  . Smoking status: Never Smoker  . Smokeless tobacco: Never Used  Substance Use Topics  . Alcohol use: No    Alcohol/week: 0.0 oz   Objective:   BP 110/78 (BP Location: Left Arm, Patient Position: Sitting, Cuff Size: Normal)   Pulse 78   Temp 98.3 F (36.8 C) (Oral)   Resp 16   Ht 5\' 1"  (1.549 m)   Wt 138 lb (62.6 kg)   SpO2 98%   BMI 26.07 kg/m  Vitals:   05/13/18 0828  BP: 110/78  Pulse: 78  Resp: 16  Temp: 98.3 F (36.8 C)  TempSrc: Oral  SpO2: 98%  Weight: 138 lb (62.6 kg)  Height: 5\' 1"  (1.549 m)     Physical Exam  Constitutional: She appears well-developed and well-nourished. No  distress.  HENT:  Head: Normocephalic and atraumatic.  Right Ear: Hearing, external ear and ear canal normal. Tympanic membrane is scarred. Tympanic membrane is not bulging. A middle ear effusion is present.  Left Ear: Hearing, external ear and ear canal normal. Tympanic membrane is scarred and bulging. A middle ear effusion (clear ) is present.  Nose: Right sinus exhibits no maxillary sinus tenderness and no frontal sinus tenderness. Left sinus exhibits no maxillary sinus tenderness and no frontal sinus tenderness.  Mouth/Throat: Uvula is midline, oropharynx is clear and moist and mucous membranes are normal. No oropharyngeal exudate.  Neck: Normal range of motion. Neck supple. No tracheal deviation present. No thyromegaly present.  Cardiovascular: Normal rate, regular rhythm and normal heart sounds. Exam reveals no gallop and no friction rub.  No murmur heard. Pulmonary/Chest: Effort normal and breath sounds normal. No stridor. No respiratory distress. She has no wheezes. She has no rales.  Lymphadenopathy:    She has no cervical adenopathy.  Skin: She is not diaphoretic.  Vitals reviewed.      Assessment & Plan:     1. ETD (Eustachian tube dysfunction), bilateral Advised patient to try OTC flonase, or other steroidal nasal spray, and sudafed for ETD. Discussed pressurizing exercises to try as well. Call  if no improvements in symptoms.        Mar Daring, PA-C  [facility]

## 2018-05-13 NOTE — Patient Instructions (Signed)
Flonase, Nasacort, Nasanex, Rhinocort (any steroidal nasal spray). 2 sprays each nostril.  Sudafed 10mg  OTC. If desired can get higher dose sudafed from behind the counter to not have to take as frequently but may have more side effects. Pressuring exercises once capable.   Eustachian Tube Dysfunction The eustachian tube connects the middle ear to the back of the nose. It regulates air pressure in the middle ear by allowing air to move between the ear and nose. It also helps to drain fluid from the middle ear space. When the eustachian tube does not function properly, air pressure, fluid, or both can build up in the middle ear. Eustachian tube dysfunction can affect one or both ears. What are the causes? This condition happens when the eustachian tube becomes blocked or cannot open normally. This may result from:  Ear infections.  Colds and other upper respiratory infections.  Allergies.  Irritation, such as from cigarette smoke or acid from the stomach coming up into the esophagus (gastroesophageal reflux).  Sudden changes in air pressure, such as from descending in an airplane.  Abnormal growths in the nose or throat, such as nasal polyps, tumors, or enlarged tissue at the back of the throat (adenoids).  What increases the risk? This condition may be more likely to develop in people who smoke and people who are overweight. Eustachian tube dysfunction may also be more likely to develop in children, especially children who have:  Certain birth defects of the mouth, such as cleft palate.  Large tonsils and adenoids.  What are the signs or symptoms? Symptoms of this condition may include:  A feeling of fullness in the ear.  Ear pain.  Clicking or popping noises in the ear.  Ringing in the ear.  Hearing loss.  Loss of balance.  Symptoms may get worse when the air pressure around you changes, such as when you travel to an area of high elevation or fly on an airplane. How is  this diagnosed? This condition may be diagnosed based on:  Your symptoms.  A physical exam of your ear, nose, and throat.  Tests, such as those that measure: ? The movement of your eardrum (tympanogram). ? Your hearing (audiometry).  How is this treated? Treatment depends on the cause and severity of your condition. If your symptoms are mild, you may be able to relieve your symptoms by moving air into ("popping") your ears. If you have symptoms of fluid in your ears, treatment may include:  Decongestants.  Antihistamines.  Nasal sprays or ear drops that contain medicines that reduce swelling (steroids).  In some cases, you may need to have a procedure to drain the fluid in your eardrum (myringotomy). In this procedure, a small tube is placed in the eardrum to:  Drain the fluid.  Restore the air in the middle ear space.  Follow these instructions at home:  Take over-the-counter and prescription medicines only as told by your health care provider.  Use techniques to help pop your ears as recommended by your health care provider. These may include: ? Chewing gum. ? Yawning. ? Frequent, forceful swallowing. ? Closing your mouth, holding your nose closed, and gently blowing as if you are trying to blow air out of your nose.  Do not do any of the following until your health care provider approves: ? Travel to high altitudes. ? Fly in airplanes. ? Work in a Pension scheme manager or room. ? Scuba dive.  Keep your ears dry. Dry your ears completely after showering  or bathing.  Do not smoke.  Keep all follow-up visits as told by your health care provider. This is important. Contact a health care provider if:  Your symptoms do not go away after treatment.  Your symptoms come back after treatment.  You are unable to pop your ears.  You have: ? A fever. ? Pain in your ear. ? Pain in your head or neck. ? Fluid draining from your ear.  Your hearing suddenly changes.  You  become very dizzy.  You lose your balance. This information is not intended to replace advice given to you by your health care provider. Make sure you discuss any questions you have with your health care provider. Document Released: 11/29/2015 Document Revised: 04/09/2016 Document Reviewed: 11/21/2014 Elsevier Interactive Patient Education  [employer].

## 2018-07-15 DIAGNOSIS — D1801 Hemangioma of skin and subcutaneous tissue: Secondary | ICD-10-CM | POA: Diagnosis not present

## 2018-07-15 DIAGNOSIS — D2262 Melanocytic nevi of left upper limb, including shoulder: Secondary | ICD-10-CM | POA: Diagnosis not present

## 2018-07-15 DIAGNOSIS — D225 Melanocytic nevi of trunk: Secondary | ICD-10-CM | POA: Diagnosis not present

## 2018-07-15 DIAGNOSIS — L821 Other seborrheic keratosis: Secondary | ICD-10-CM | POA: Diagnosis not present

## 2018-07-15 DIAGNOSIS — D2261 Melanocytic nevi of right upper limb, including shoulder: Secondary | ICD-10-CM | POA: Diagnosis not present

## 2018-08-24 MED FILL — ESCITALOPRAM 20 MG TABLET: 20 | 90 days supply | Qty: 90 | Fill #2

## 2018-11-25 DIAGNOSIS — L821 Other seborrheic keratosis: Secondary | ICD-10-CM | POA: Diagnosis not present

## 2018-12-21 ENCOUNTER — Other Ambulatory Visit: Payer: Self-pay | Admitting: Family Medicine

## 2018-12-21 MED FILL — ESCITALOPRAM 20 MG TABLET: 20 | 90 days supply | Qty: 90 | Fill #0

## 2019-01-11 ENCOUNTER — Encounter: Payer: Self-pay | Admitting: Family Medicine

## 2019-01-11 ENCOUNTER — Ambulatory Visit (INDEPENDENT_AMBULATORY_CARE_PROVIDER_SITE_OTHER): Payer: 59 | Admitting: Family Medicine

## 2019-01-11 ENCOUNTER — Telehealth: Payer: Self-pay

## 2019-01-11 VITALS — BP 97/66 | HR 75 | Temp 98.1°F | Wt 139.0 lb

## 2019-01-11 DIAGNOSIS — F509 Eating disorder, unspecified: Secondary | ICD-10-CM

## 2019-01-11 DIAGNOSIS — M7552 Bursitis of left shoulder: Secondary | ICD-10-CM

## 2019-01-11 NOTE — Progress Notes (Signed)
Patient: Virginia [name] Female    DOB: Aug 10, 1966   53 y.o.   MRN: [id number] Visit Date: 01/11/2019  Today's Provider: Lavon Paganini, MD   Chief Complaint  Patient presents with  . Arm Pain   Subjective:    I, Tiburcio Pea, [name], am acting as a Education administrator for Lavon Paganini, MD.   Arm Pain   Incident onset: over 1 month ago. There was no injury mechanism. Pain location: left arm. The quality of the pain is described as aching. The pain radiates to the left hand. The pain has been worsening since the incident. Associated symptoms include numbness (left fingers) and tingling (left fingers). Exacerbated by: lying on left side and lifting arm  Treatments tried: Aleve. The treatment provided mild relief.    No Known Allergies   Current Outpatient Medications:  .  escitalopram (LEXAPRO) 20 MG tablet, TAKE 1 TABLET BY MOUTH DAILY, Disp: 90 tablet, Rfl: 0 .  ibuprofen (ADVIL,MOTRIN) 100 MG tablet, Take 200 mg by mouth every 6 (six) hours as needed for fever., Disp: , Rfl:  .  naproxen sodium (ALEVE) 220 MG tablet, Take 220 mg by mouth daily., Disp: , Rfl:   Review of Systems  Constitutional: Negative.   Respiratory: Negative.   Cardiovascular: Negative.   Musculoskeletal: Negative.        Arm pain  Neurological: Positive for tingling (left fingers) and numbness (left fingers).    Social History   Tobacco Use  . Smoking status: Never Smoker  . Smokeless tobacco: Never Used  Substance Use Topics  . Alcohol use: No    Alcohol/week: 0.0 standard drinks      Objective:   BP 97/66 (BP Location: Right Arm, Patient Position: Sitting, Cuff Size: Normal)   Pulse 75   Temp 98.1 F (36.7 C) (Oral)   Wt 139 lb (63 kg)   SpO2 99%   BMI 26.26 kg/m  Vitals:   01/11/19 0806  BP: 97/66  Pulse: 75  Temp: 98.1 F (36.7 C)  TempSrc: Oral  SpO2: 99%  Weight: 139 lb (63 kg)     Physical Exam Vitals signs reviewed.  Constitutional:      General: She is not in acute  distress.    Appearance: Normal appearance. She is not diaphoretic.  HENT:     Head: Normocephalic and atraumatic.  Eyes:     General: No scleral icterus.    Conjunctiva/sclera: Conjunctivae normal.  Cardiovascular:     Rate and Rhythm: Normal rate and regular rhythm.  Pulmonary:     Effort: Pulmonary effort is normal. No respiratory distress.  Musculoskeletal:     Comments: L Shoulder: Inspection reveals no abnormalities, atrophy or asymmetry. No tenderness over AC joint, some tenderness over bicipital groove. ROM is full in all planes. Rotator cuff strength normal throughout. + signs of impingement with + Neer and Hawkin's tests, empty can. Speeds and Yergason's tests normal. No labral pathology noted with negative Obrien's, negative clunk and good stability. Normal scapular function observed. No painful arc and no drop arm sign. No apprehension sign   Negative tinels and phalens.  Some tingling in L thumb with palpation over radial nerve  Skin:    General: Skin is warm and dry.     Capillary Refill: Capillary refill takes less than 2 seconds.     Findings: No rash.  Neurological:     Mental Status: She is alert and oriented to person, place, and time. Mental status is  at baseline.     Sensory: No sensory deficit.  Psychiatric:        Mood and Affect: Mood normal.        Behavior: Behavior normal.         Assessment & Plan   1. Bursitis of left shoulder - history and exam c/w subacromial bursitis - discussed conservative management +/- corticosteroid injection - patient wishes to proceed with injection which was performed today as below - discussed HEP and return precautions  INJECTION: Patient was given informed consent. Appropriate time out was taken. Area prepped and draped in usual sterile fashion. 2 cc of depo-medrol 40 mg/ml plus  3 cc of 1% lidocaine without epinephrine was injected into the L shoulder subacromial space using a(n) posterior approach. The  patient tolerated the procedure well. There were no complications. Post procedure instructions were given.   2. Eating disorder, unspecified type - discussed therapy as she is feeling more compulsions - continue Lexapro at current dose - will find a good therapist for this in the area and let the patient know    Return if symptoms worsen or fail to improve.   The entirety of the information documented in the History of Present Illness, Review of Systems and Physical Exam were personally obtained by me. Portions of this information were initially documented by Tiburcio Pea, [name] and reviewed by me for thoroughness and accuracy.    Virginia Crews, MD, MPH [facility] 01/11/2019 10:31 AM

## 2019-01-11 NOTE — Telephone Encounter (Signed)
-----   Message from Virginia Crews, MD sent at 01/11/2019  1:14 PM EST ----- Regarding: FW: Eating disorder therapist recommendation? Can you call the patient and give her this information?  She is expecting it.  Thanks! ----- Message ----- From: Chauncey Mann, MD Sent: 01/11/2019  12:04 PM EST To: Virginia Crews, MD Subject: RE: Eating disorder therapist recommendation?  An excellent Eating Disorder therapist:  Alvis Lemmings in [place] [phone]   [email or web address]  Cephus Shelling  ----- Message ----- From: Virginia Crews, MD Sent: 01/11/2019   8:30 AM EST To: Chauncey Mann, MD Subject: Eating disorder therapist recommendation?      Hi Dr. Nicolasa Ducking,  I was wondering if you know of any good therapists in the area that could work with some CBT for disordered eating.  My patient has struggled with this for a long time.  She is doing well on Lexapro, but knows that therapy would be very beneficial as well.  Thanks in advance, Virginia [name]

## 2019-01-11 NOTE — Telephone Encounter (Signed)
LMTCB

## 2019-01-11 NOTE — Telephone Encounter (Signed)
Patient advised.

## 2019-04-03 ENCOUNTER — Encounter: Payer: 59 | Admitting: Family Medicine

## 2019-04-03 ENCOUNTER — Encounter: Payer: Self-pay | Admitting: Family Medicine

## 2019-04-17 ENCOUNTER — Ambulatory Visit (INDEPENDENT_AMBULATORY_CARE_PROVIDER_SITE_OTHER): Payer: 59 | Admitting: Family Medicine

## 2019-04-17 ENCOUNTER — Other Ambulatory Visit: Payer: Self-pay

## 2019-04-17 ENCOUNTER — Encounter: Payer: Self-pay | Admitting: Family Medicine

## 2019-04-17 VITALS — BP 107/70 | HR 67 | Temp 97.5°F | Resp 18 | Wt 138.0 lb

## 2019-04-17 DIAGNOSIS — Z8349 Family history of other endocrine, nutritional and metabolic diseases: Secondary | ICD-10-CM

## 2019-04-17 DIAGNOSIS — E559 Vitamin D deficiency, unspecified: Secondary | ICD-10-CM

## 2019-04-17 DIAGNOSIS — Z0000 Encounter for general adult medical examination without abnormal findings: Secondary | ICD-10-CM

## 2019-04-17 DIAGNOSIS — Z114 Encounter for screening for human immunodeficiency virus [HIV]: Secondary | ICD-10-CM

## 2019-04-17 DIAGNOSIS — F509 Eating disorder, unspecified: Secondary | ICD-10-CM | POA: Diagnosis not present

## 2019-04-17 DIAGNOSIS — Z1239 Encounter for other screening for malignant neoplasm of breast: Secondary | ICD-10-CM | POA: Diagnosis not present

## 2019-04-17 MED ORDER — ESCITALOPRAM OXALATE 20 MG PO TABS: 20.0000 mg | ORAL_TABLET | Freq: Every day | ORAL | 1 refills | Status: DC

## 2019-04-17 MED FILL — ESCITALOPRAM 20 MG TABLET: 20 | 90 days supply | Qty: 90 | Fill #0

## 2019-04-17 NOTE — Progress Notes (Signed)
Patient: Virginia [name], Female    DOB: 1966-03-29, 53 y.o.   MRN: [id number] Visit Date: 04/17/2019  Today's Provider: Lavon Paganini, MD   No chief complaint on file.  Subjective:     Annual physical exam Virginia [name] is a 53 y.o. female who presents today for health maintenance and complete physical. She feels well.. She reports exercising 3 times a week with walking. She reports she is sleeping well.  Sees GYN  - last pap in 05/2017, last mammogram in 2016 -----------------------------------------------------------------   Review of Systems  Constitutional: Negative.   HENT: Negative.   Eyes: Negative.   Respiratory: Negative.   Cardiovascular: Negative.   Gastrointestinal: Negative.   Endocrine: Negative.   Genitourinary: Negative.   Musculoskeletal: Negative.   Skin: Negative.   Allergic/Immunologic: Negative.   Neurological: Negative.   Hematological: Negative.   Psychiatric/Behavioral: Negative.   All other systems reviewed and are negative.   Social History      She  reports that she has never smoked. She has never used smokeless tobacco. She reports that she does not drink alcohol or use drugs.       Social History   Socioeconomic History  . Marital status: Married    Spouse name: Luretha Rued  . Number of children: 2  . Years of education: 16  . Highest education level: Bachelor's degree (e.g., BA, AB, BS)  Occupational History  . Occupation: works on [employer]  Social Needs  . Financial resource strain: Not hard at all  . Food insecurity:    Worry: Never true    Inability: Never true  . Transportation needs:    Medical: No    Non-medical: No  Tobacco Use  . Smoking status: Never Smoker  . Smokeless tobacco: Never Used  Substance and Sexual Activity  . Alcohol use: No    Alcohol/week: 0.0 standard drinks  . Drug use: No  . Sexual activity: Yes    Partners: Male    Birth control/protection: Surgical   Comment: husband s/p vasectomy  Lifestyle  . Physical activity:    Days per week: Not on file    Minutes per session: Not on file  . Stress: Not at all  Relationships  . Social connections:    Talks on phone: Not on file    Gets together: Not on file    Attends religious service: Not on file    Active member of club or organization: Not on file    Attends meetings of clubs or organizations: Not on file    Relationship status: Not on file  Other Topics Concern  . Not on file  Social History Narrative  . Not on file    Past Medical History:  Diagnosis Date  . Anorexia   . Anxiety   . Arthritis    knees  . Fibroids   . Wears glasses      Patient Active Problem List   Diagnosis Date Noted  . Special screening for malignant neoplasms, colon   . Disordered eating 09/30/2017  . Avitaminosis D 09/30/2017  . Family history of thyroid disease 09/30/2017  . Fibroid 08/31/2017  . Irregular bleeding 08/19/2017  . Arthralgia of hip 10/07/2015    Past Surgical History:  Procedure Laterality Date  . CLEFT PALATE REPAIR    . CLEFT PALATE REPAIR  1970  . COLONOSCOPY WITH PROPOFOL N/A 04/25/2018   Procedure: COLONOSCOPY WITH PROPOFOL;  Surgeon: [name], MD;  Location: [place];  Service: Endoscopy;  Laterality: N/A;    Family History        Family Status  Relation Name Status  . Mother  Alive  . Father  Alive  . Daughter  Alive  . MGM  (Not Specified)  . Ethlyn Daniels  (Not Specified)  . Annamarie Major  (Not Specified)  . Sister  Alive  . Sister  Alive  . Mat Uncle  (Not Specified)  . Son  Alive  . Neg Hx  (Not Specified)        Her family history includes Anxiety disorder in her daughter and mother; Diabetes in her father; Healthy in her son; Heart disease in her maternal grandmother, paternal aunt, and paternal uncle; Heart disease (age of onset: 63) in her father; Hyperlipidemia in her sister; Hypertension in her father and sister; Hypothyroidism in her father  and sister; Lung cancer in her maternal uncle; Pancreatic cancer in her paternal uncle; Stroke in her paternal aunt; Stroke (age of onset: 32) in her maternal grandmother. There is no history of Colon cancer, Breast cancer, Ovarian cancer, or Cervical cancer.      No Known Allergies   Current Outpatient Medications:  .  escitalopram (LEXAPRO) 20 MG tablet, Take 1 tablet (20 mg total) by mouth daily., Disp: 90 tablet, Rfl: 1 .  ibuprofen (ADVIL,MOTRIN) 100 MG tablet, Take 200 mg by mouth every 6 (six) hours as needed for fever., Disp: , Rfl:  .  naproxen sodium (ALEVE) 220 MG tablet, Take 220 mg by mouth daily., Disp: , Rfl:    Patient Care Team: Virginia Crews, MD as PCP - General (Family Medicine)    Objective:    Vitals: BP 107/70 (BP Location: Left Arm, Patient Position: Sitting, Cuff Size: Normal)   Pulse 67   Temp (!) 97.5 F (36.4 C) (Oral)   Resp 18   Wt 138 lb (62.6 kg)   BMI 26.07 kg/m    Vitals:   04/17/19 1548  BP: 107/70  Pulse: 67  Resp: 18  Temp: (!) 97.5 F (36.4 C)  TempSrc: Oral  Weight: 138 lb (62.6 kg)     Physical Exam Vitals signs reviewed.  Constitutional:      General: She is not in acute distress.    Appearance: Normal appearance. She is well-developed. She is not diaphoretic.  HENT:     Head: Normocephalic and atraumatic.     Right Ear: External ear normal.     Left Ear: External ear normal.  Eyes:     General: No scleral icterus.    Conjunctiva/sclera: Conjunctivae normal.     Pupils: Pupils are equal, round, and reactive to light.  Neck:     Musculoskeletal: Neck supple.     Thyroid: No thyromegaly.  Cardiovascular:     Rate and Rhythm: Normal rate and regular rhythm.     Pulses: Normal pulses.     Heart sounds: Normal heart sounds. No murmur.  Pulmonary:     Effort: Pulmonary effort is normal. No respiratory distress.     Breath sounds: Normal breath sounds. No wheezing or rales.  Abdominal:     General: There is no  distension.     Palpations: Abdomen is soft.     Tenderness: There is no abdominal tenderness.  Musculoskeletal:        General: No deformity.     Right lower leg: No edema.     Left lower leg: No edema.  Lymphadenopathy:  Cervical: No cervical adenopathy.  Skin:    General: Skin is warm and dry.     Capillary Refill: Capillary refill takes less than 2 seconds.     Findings: No rash.  Neurological:     Mental Status: She is alert and oriented to person, place, and time.  Psychiatric:        Mood and Affect: Mood normal.        Behavior: Behavior normal.        Thought Content: Thought content normal.      Depression Screen PHQ 2/9 Scores 04/17/2019 03/30/2018 09/30/2017 09/30/2017  PHQ - 2 Score 0 0 0 0  PHQ- 9 Score - - 2 -      Assessment & Plan:     Routine Health Maintenance and Physical Exam  Exercise Activities and Dietary recommendations Goals   None     Immunization History  Administered Date(s) Administered  . Influenza-Unspecified 08/08/2015  . Tdap 11/06/2010    Health Maintenance  Topic Date Due  . HIV Screening  01/24/1983  . MAMMOGRAM  01/23/2018  . INFLUENZA VACCINE  06/17/2019  . PAP SMEAR-Modifier  06/09/2020  . TETANUS/TDAP  11/06/2020  . COLONOSCOPY  04/25/2028     Discussed health benefits of physical activity, and encouraged her to engage in regular exercise appropriate for her age and condition.    --------------------------------------------------------------------  Problem List Items Addressed This Visit      Other   Disordered eating    Well controlled Continue lexapro at current dose Referred to [facility] for therapy - and planning to make appt - postponed due to pandemic      Avitaminosis D   Relevant Orders   VITAMIN D 25 Hydroxy (Vit-D Deficiency, Fractures)   Family history of thyroid disease   Relevant Orders   TSH    Other Visit Diagnoses    Encounter for annual physical exam    -  Primary    Relevant Orders   Lipid panel   Comprehensive metabolic panel   CBC   HIV antibody (with reflex)   VITAMIN D 25 Hydroxy (Vit-D Deficiency, Fractures)   TSH   MM 3D SCREEN BREAST BILATERAL   Screening for breast cancer       Relevant Orders   MM 3D SCREEN BREAST BILATERAL   Screening for HIV (human immunodeficiency virus)       Relevant Orders   HIV antibody (with reflex)       Return in about 1 year (around 04/16/2020) for CPE.   The entirety of the information documented in the History of Present Illness, Review of Systems and Physical Exam were personally obtained by me. Portions of this information were initially documented by Tiburcio Pea, [name] and reviewed by me for thoroughness and accuracy.    Cynthya Yam, Dionne Bucy, MD MPH [facility]

## 2019-04-17 NOTE — Assessment & Plan Note (Signed)
Well controlled Continue lexapro at current dose Referred to [facility] for therapy - and planning to make appt - postponed due to pandemic

## 2019-04-17 NOTE — Patient Instructions (Signed)
Preventive Care 40-64 Years, Female Preventive care refers to lifestyle choices and visits with your health care provider that can promote health and wellness. What does preventive care include?   A yearly physical exam. This is also called an annual well check.  Dental exams once or twice a year.  Routine eye exams. Ask your health care provider how often you should have your eyes checked.  Personal lifestyle choices, including: ? Daily care of your teeth and gums. ? Regular physical activity. ? Eating a healthy diet. ? Avoiding tobacco and drug use. ? Limiting alcohol use. ? Practicing safe sex. ? Taking low-dose aspirin daily starting at age 50. ? Taking vitamin and mineral supplements as recommended by your health care provider. What happens during an annual well check? The services and screenings done by your health care provider during your annual well check will depend on your age, overall health, lifestyle risk factors, and family history of disease. Counseling Your health care provider may ask you questions about your:  Alcohol use.  Tobacco use.  Drug use.  Emotional well-being.  Home and relationship well-being.  Sexual activity.  Eating habits.  Work and work environment.  Method of birth control.  Menstrual cycle.  Pregnancy history. Screening You may have the following tests or measurements:  Height, weight, and BMI.  Blood pressure.  Lipid and cholesterol levels. These may be checked every 5 years, or more frequently if you are over 50 years old.  Skin check.  Lung cancer screening. You may have this screening every year starting at age 55 if you have a 30-pack-year history of smoking and currently smoke or have quit within the past 15 years.  Colorectal cancer screening. All adults should have this screening starting at age 50 and continuing until age 75. Your health care provider may recommend screening at age 45. You will have tests every  1-10 years, depending on your results and the type of screening test. People at increased risk should start screening at an earlier age. Screening tests may include: ? Guaiac-based fecal occult blood testing. ? Fecal immunochemical test (FIT). ? Stool DNA test. ? Virtual colonoscopy. ? Sigmoidoscopy. During this test, a flexible tube with a tiny camera (sigmoidoscope) is used to examine your rectum and lower colon. The sigmoidoscope is inserted through your anus into your rectum and lower colon. ? Colonoscopy. During this test, a long, thin, flexible tube with a tiny camera (colonoscope) is used to examine your entire colon and rectum.  Hepatitis C blood test.  Hepatitis B blood test.  Sexually transmitted disease (STD) testing.  Diabetes screening. This is done by checking your blood sugar (glucose) after you have not eaten for a while (fasting). You may have this done every 1-3 years.  Mammogram. This may be done every 1-2 years. Talk to your health care provider about when you should start having regular mammograms. This may depend on whether you have a family history of breast cancer.  BRCA-related cancer screening. This may be done if you have a family history of breast, ovarian, tubal, or peritoneal cancers.  Pelvic exam and Pap test. This may be done every 3 years starting at age 21. Starting at age 30, this may be done every 5 years if you have a Pap test in combination with an HPV test.  Bone density scan. This is done to screen for osteoporosis. You may have this scan if you are at high risk for osteoporosis. Discuss your test results, treatment options,   and if necessary, the need for more tests with your health care provider. Vaccines Your health care provider may recommend certain vaccines, such as:  Influenza vaccine. This is recommended every year.  Tetanus, diphtheria, and acellular pertussis (Tdap, Td) vaccine. You may need a Td booster every 10 years.  Varicella  vaccine. You may need this if you have not been vaccinated.  Zoster vaccine. You may need this after age 38.  Measles, mumps, and rubella (MMR) vaccine. You may need at least one dose of MMR if you were born in 1957 or later. You may also need a second dose.  Pneumococcal 13-valent conjugate (PCV13) vaccine. You may need this if you have certain conditions and were not previously vaccinated.  Pneumococcal polysaccharide (PPSV23) vaccine. You may need one or two doses if you smoke cigarettes or if you have certain conditions.  Meningococcal vaccine. You may need this if you have certain conditions.  Hepatitis A vaccine. You may need this if you have certain conditions or if you travel or work in places where you may be exposed to hepatitis A.  Hepatitis B vaccine. You may need this if you have certain conditions or if you travel or work in places where you may be exposed to hepatitis B.  Haemophilus influenzae type b (Hib) vaccine. You may need this if you have certain conditions. Talk to your health care provider about which screenings and vaccines you need and how often you need them. This information is not intended to replace advice given to you by your health care provider. Make sure you discuss any questions you have with your health care provider. Document Released: 11/29/2015 Document Revised: 12/23/2017 Document Reviewed: 09/03/2015 Elsevier Interactive Patient Education  2019 [employer].

## 2019-04-20 DIAGNOSIS — Z8349 Family history of other endocrine, nutritional and metabolic diseases: Secondary | ICD-10-CM | POA: Diagnosis not present

## 2019-04-20 DIAGNOSIS — Z114 Encounter for screening for human immunodeficiency virus [HIV]: Secondary | ICD-10-CM | POA: Diagnosis not present

## 2019-04-20 DIAGNOSIS — E559 Vitamin D deficiency, unspecified: Secondary | ICD-10-CM | POA: Diagnosis not present

## 2019-04-20 DIAGNOSIS — Z0000 Encounter for general adult medical examination without abnormal findings: Secondary | ICD-10-CM | POA: Diagnosis not present

## 2019-04-21 LAB — CBC
Hematocrit: 39 % (ref 34.0–46.6)
Hemoglobin: 13 g/dL (ref 11.1–15.9)
MCH: 29.5 pg (ref 26.6–33.0)
MCHC: 33.3 g/dL (ref 31.5–35.7)
MCV: 89 fL (ref 79–97)
Platelets: 178 10*3/uL (ref 150–450)
RBC: 4.4 x10E6/uL (ref 3.77–5.28)
RDW: 11.9 % (ref 11.7–15.4)
WBC: 5.9 10*3/uL (ref 3.4–10.8)

## 2019-04-21 LAB — LIPID PANEL
Chol/HDL Ratio: 2.7 ratio (ref 0.0–4.4)
Cholesterol, Total: 188 mg/dL (ref 100–199)
HDL: 69 mg/dL (ref >39)
LDL Calculated: 106 mg/dL — ABNORMAL HIGH (ref 0–99)
Triglycerides: 63 mg/dL (ref 0–149)
VLDL Cholesterol Cal: 13 mg/dL (ref 5–40)

## 2019-04-21 LAB — COMPREHENSIVE METABOLIC PANEL
ALT: 14 IU/L (ref 0–32)
AST: 15 IU/L (ref 0–40)
Albumin/Globulin Ratio: 2.3 — ABNORMAL HIGH (ref 1.2–2.2)
Albumin: 4.6 g/dL (ref 3.8–4.9)
Alkaline Phosphatase: 58 IU/L (ref 39–117)
BUN/Creatinine Ratio: 35 — ABNORMAL HIGH (ref 9–23)
BUN: 21 mg/dL (ref 6–24)
Bilirubin Total: 0.3 mg/dL (ref 0.0–1.2)
CO2: 20 mmol/L (ref 20–29)
Calcium: 9.5 mg/dL (ref 8.7–10.2)
Chloride: 108 mmol/L — ABNORMAL HIGH (ref 96–106)
Creatinine, Ser: 0.6 mg/dL (ref 0.57–1.00)
GFR calc Af Amer: 122 mL/min/{1.73_m2} (ref >59)
GFR calc non Af Amer: 106 mL/min/{1.73_m2} (ref >59)
Globulin, Total: 2 g/dL (ref 1.5–4.5)
Glucose: 93 mg/dL (ref 65–99)
Potassium: 4.8 mmol/L (ref 3.5–5.2)
Sodium: 144 mmol/L (ref 134–144)
Total Protein: 6.6 g/dL (ref 6.0–8.5)

## 2019-04-21 LAB — VITAMIN D 25 HYDROXY (VIT D DEFICIENCY, FRACTURES): Vit D, 25-Hydroxy: 24.5 ng/mL — ABNORMAL LOW (ref 30.0–100.0)

## 2019-04-21 LAB — HIV ANTIBODY (ROUTINE TESTING W REFLEX): HIV Screen 4th Generation wRfx: NONREACTIVE

## 2019-04-21 LAB — TSH: TSH: 1.67 u[IU]/mL (ref 0.450–4.500)

## 2019-04-24 ENCOUNTER — Telehealth: Payer: Self-pay

## 2019-04-24 NOTE — Telephone Encounter (Signed)
-----   Message from Virginia Crews, MD sent at 04/24/2019 11:26 AM EDT ----- Normal labs, except vitamin D level is very slightly low.  Recommend OTC supplement of vitamin D3 1000-2000 units daily.  Cholesterol has gone up in the last year, but not at a level that requires medication. recommend diet low in saturated fat and regular exercise - 30 min at least 5 times per week.

## 2019-04-24 NOTE — Telephone Encounter (Signed)
LMTCB

## 2019-04-25 NOTE — Telephone Encounter (Signed)
LMTCB

## 2019-04-26 NOTE — Telephone Encounter (Signed)
lmtcb

## 2019-05-02 NOTE — Telephone Encounter (Signed)
Letter mailed to address in chart

## 2019-05-22 ENCOUNTER — Ambulatory Visit
Admission: RE | Admit: 2019-05-22 | Discharge: 2019-05-22 | Disposition: A | Discharge status: Home / Self Care | Payer: 59 | Source: Ambulatory Visit | Attending: Family Medicine | Admitting: Family Medicine

## 2019-05-22 DIAGNOSIS — Z1231 Encounter for screening mammogram for malignant neoplasm of breast: Secondary | ICD-10-CM | POA: Insufficient documentation

## 2019-05-22 DIAGNOSIS — R928 Other abnormal and inconclusive findings on diagnostic imaging of breast: Secondary | ICD-10-CM | POA: Diagnosis not present

## 2019-05-22 DIAGNOSIS — Z1239 Encounter for other screening for malignant neoplasm of breast: Secondary | ICD-10-CM

## 2019-05-22 DIAGNOSIS — Z0000 Encounter for general adult medical examination without abnormal findings: Secondary | ICD-10-CM

## 2019-05-22 IMAGING — MG DIGITAL SCREENING BILATERAL MAMMOGRAM WITH TOMO AND CAD
8 series · 8 of 24 positions shown · non-contrast
Comparison: Previous exam(s).

CLINICAL DATA: Screening.

EXAM:
DIGITAL SCREENING BILATERAL MAMMOGRAM WITH TOMO AND CAD

[L CC synth-2D]
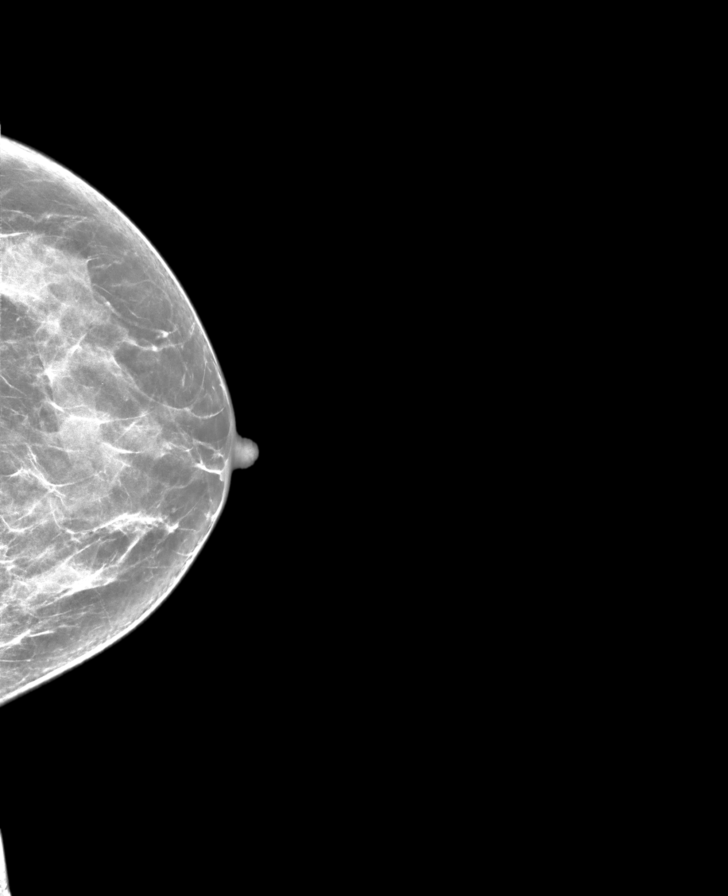

[L MLO synth-2D]
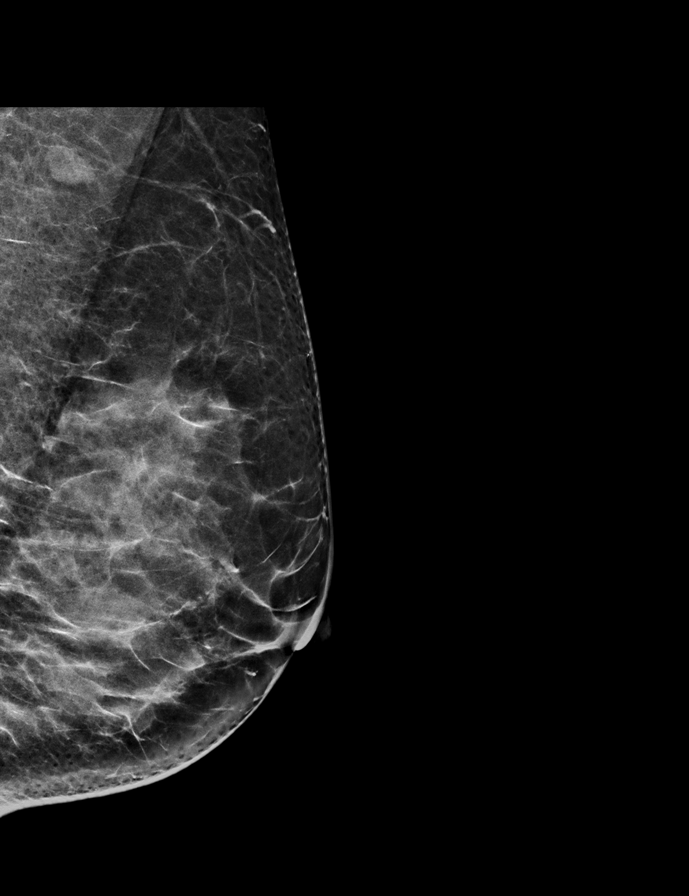

[R CC synth-2D]
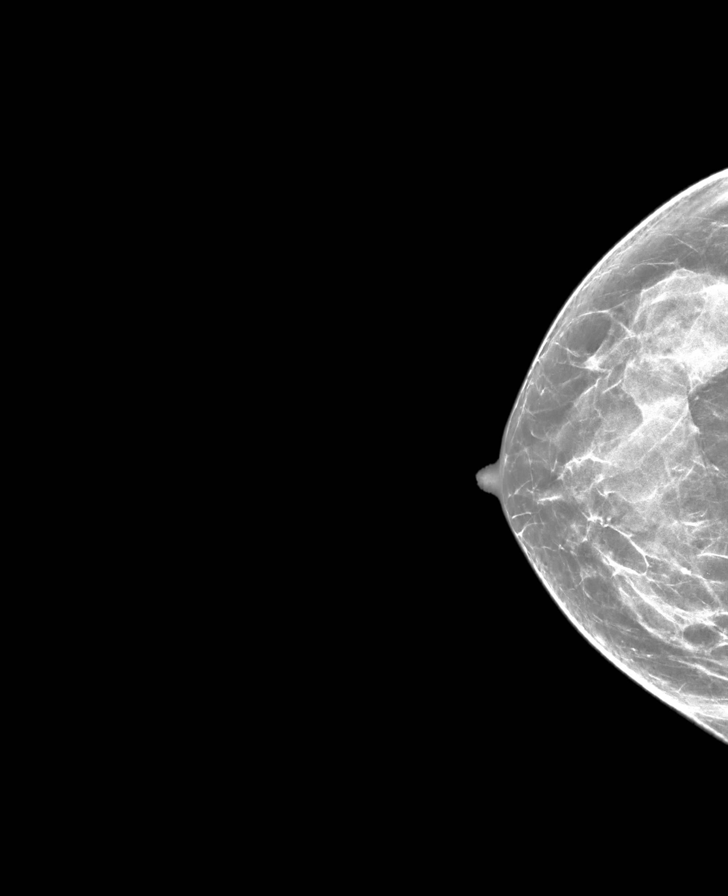

[R MLO synth-2D]
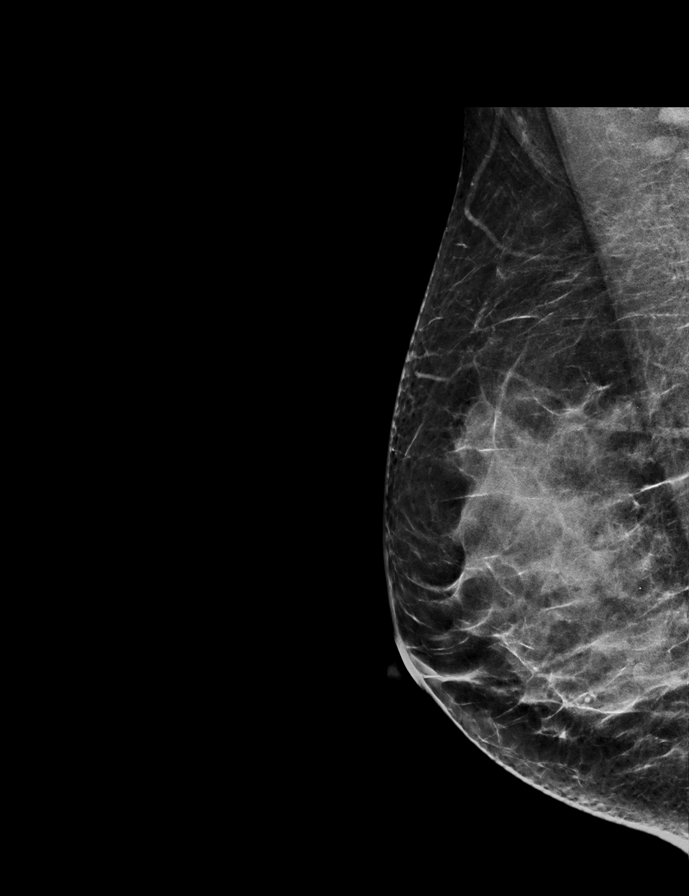

[R CC tomo · tomo slice 31/62.0]
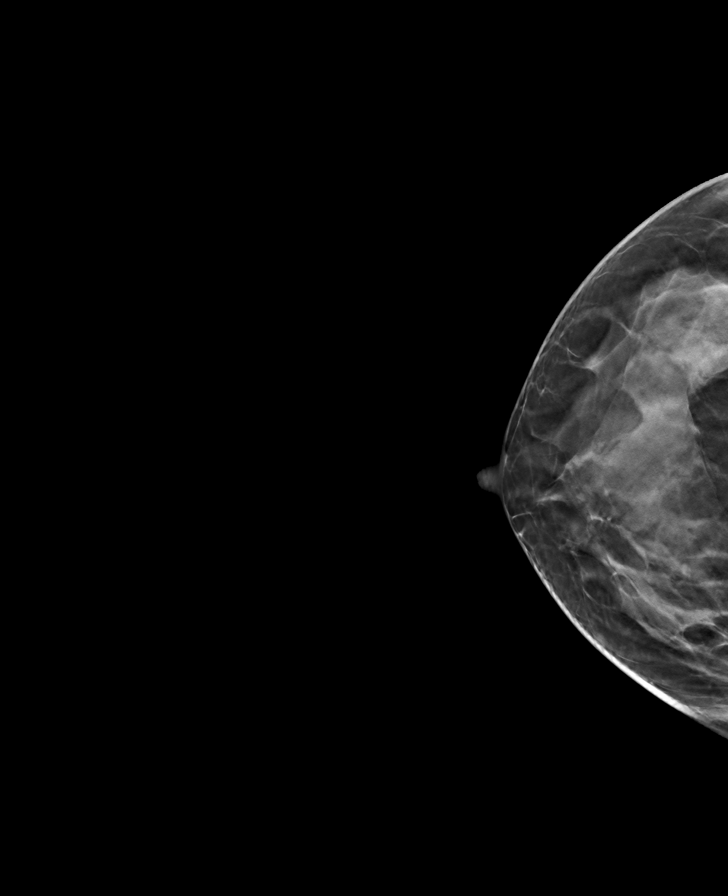

[R MLO tomo · tomo slice 33/64.0]
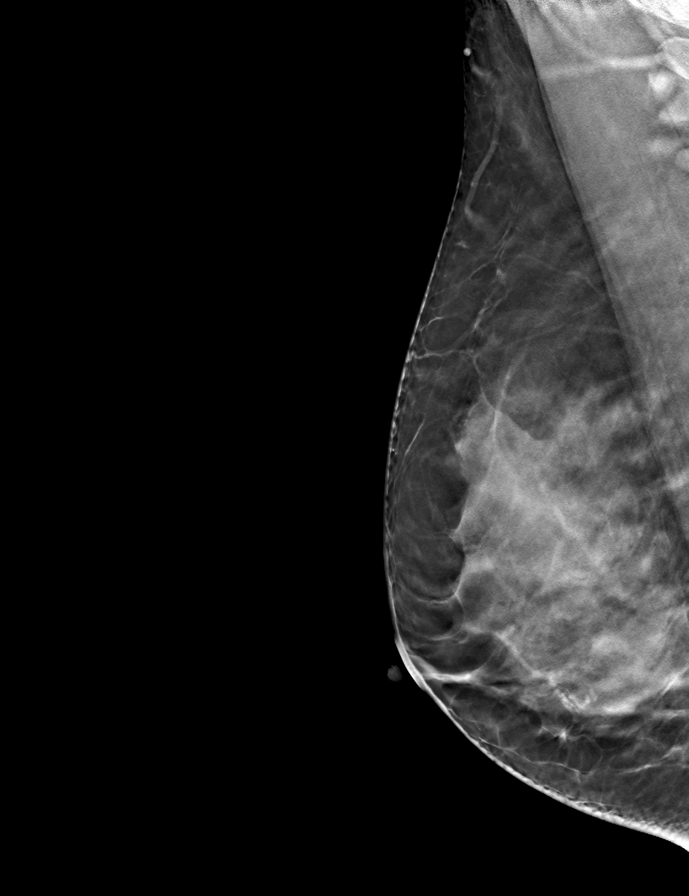

[L CC tomo · tomo slice 31/61.0]
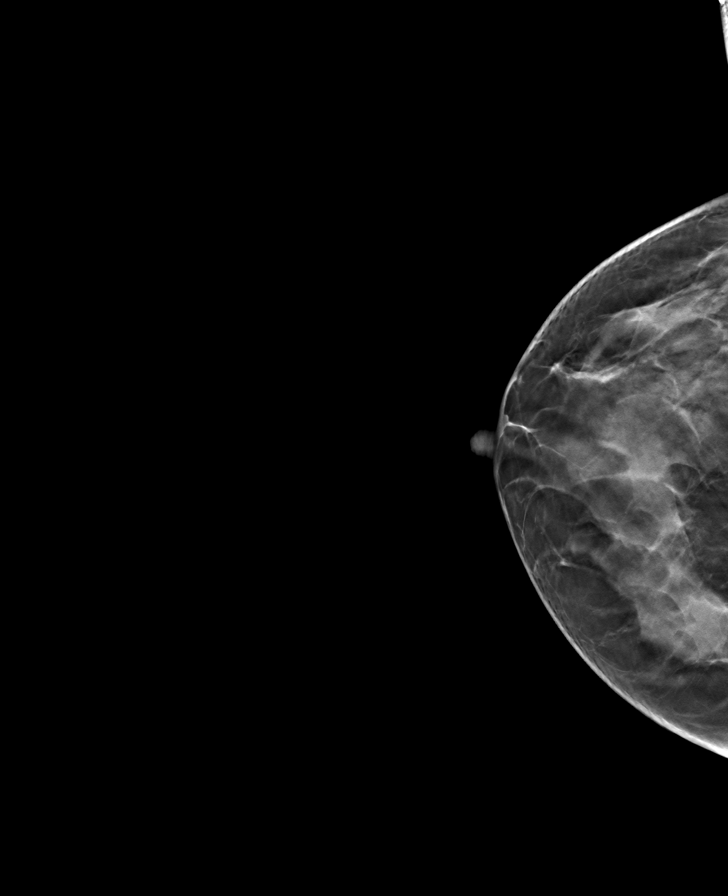

[L MLO tomo · tomo slice 31/61.0]
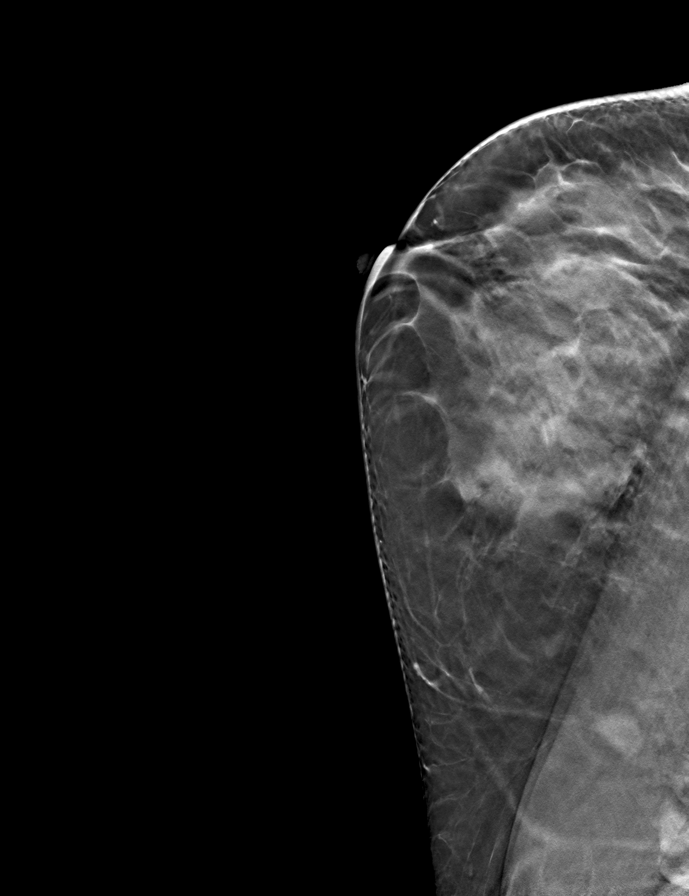

[8 of 24 positions shown; findings below may reference images not displayed]

ACR Breast Density Category c: The breast tissue is heterogeneously
dense, which may obscure small masses.
FINDINGS: In the right breast, a possible mass warrants further evaluation. In
the left breast, no findings suspicious for malignancy. Images were
processed with CAD.
IMPRESSION: Further evaluation is suggested for possible mass in the right
breast.

RECOMMENDATION:
Diagnostic mammogram and possibly ultrasound of the right breast.
(Code:[X0])

The patient will be contacted regarding the findings, and additional
imaging will be scheduled.

BI-RADS CATEGORY  0: Incomplete. Need additional imaging evaluation
and/or prior mammograms for comparison.

## 2019-05-26 ENCOUNTER — Encounter: Payer: Self-pay | Admitting: Family Medicine

## 2019-05-30 ENCOUNTER — Encounter: Payer: Self-pay | Admitting: Family Medicine

## 2019-05-30 ENCOUNTER — Telehealth: Payer: Self-pay

## 2019-05-30 NOTE — Telephone Encounter (Signed)
Per Crystal at [facility] they are waiting on prior mammogram reports from [facility]. She states the reports were requested on 05/22/2019.

## 2019-05-30 NOTE — Telephone Encounter (Signed)
LMTCB

## 2019-05-30 NOTE — Telephone Encounter (Signed)
Please let patient know.  She was requesting results

## 2019-05-31 ENCOUNTER — Other Ambulatory Visit: Payer: Self-pay | Admitting: Family Medicine

## 2019-05-31 DIAGNOSIS — R928 Other abnormal and inconclusive findings on diagnostic imaging of breast: Secondary | ICD-10-CM

## 2019-05-31 DIAGNOSIS — N6310 Unspecified lump in the right breast, unspecified quadrant: Secondary | ICD-10-CM

## 2019-06-01 ENCOUNTER — Ambulatory Visit
Admission: RE | Admit: 2019-06-01 | Discharge: 2019-06-01 | Disposition: A | Discharge status: Home / Self Care | Payer: 59 | Source: Ambulatory Visit | Attending: Family Medicine | Admitting: Family Medicine

## 2019-06-01 ENCOUNTER — Other Ambulatory Visit: Payer: Self-pay

## 2019-06-01 ENCOUNTER — Telehealth: Payer: Self-pay

## 2019-06-01 DIAGNOSIS — R928 Other abnormal and inconclusive findings on diagnostic imaging of breast: Secondary | ICD-10-CM | POA: Diagnosis not present

## 2019-06-01 DIAGNOSIS — N6310 Unspecified lump in the right breast, unspecified quadrant: Secondary | ICD-10-CM

## 2019-06-01 DIAGNOSIS — R922 Inconclusive mammogram: Secondary | ICD-10-CM | POA: Diagnosis not present

## 2019-06-01 IMAGING — MG DIGITAL DIAGNOSTIC UNILATERAL RIGHT MAMMOGRAM WITH TOMO AND CAD
4 series · 4 of 12 positions shown · non-contrast
Comparison: Previous exam(s).

CLINICAL DATA: 53-year-old female recalled from screening mammogram
dated [DATE] for a possible right breast mass.

EXAM:
DIGITAL DIAGNOSTIC UNILATERAL RIGHT MAMMOGRAM WITH CAD AND TOMO

[R MLO synth-2D]
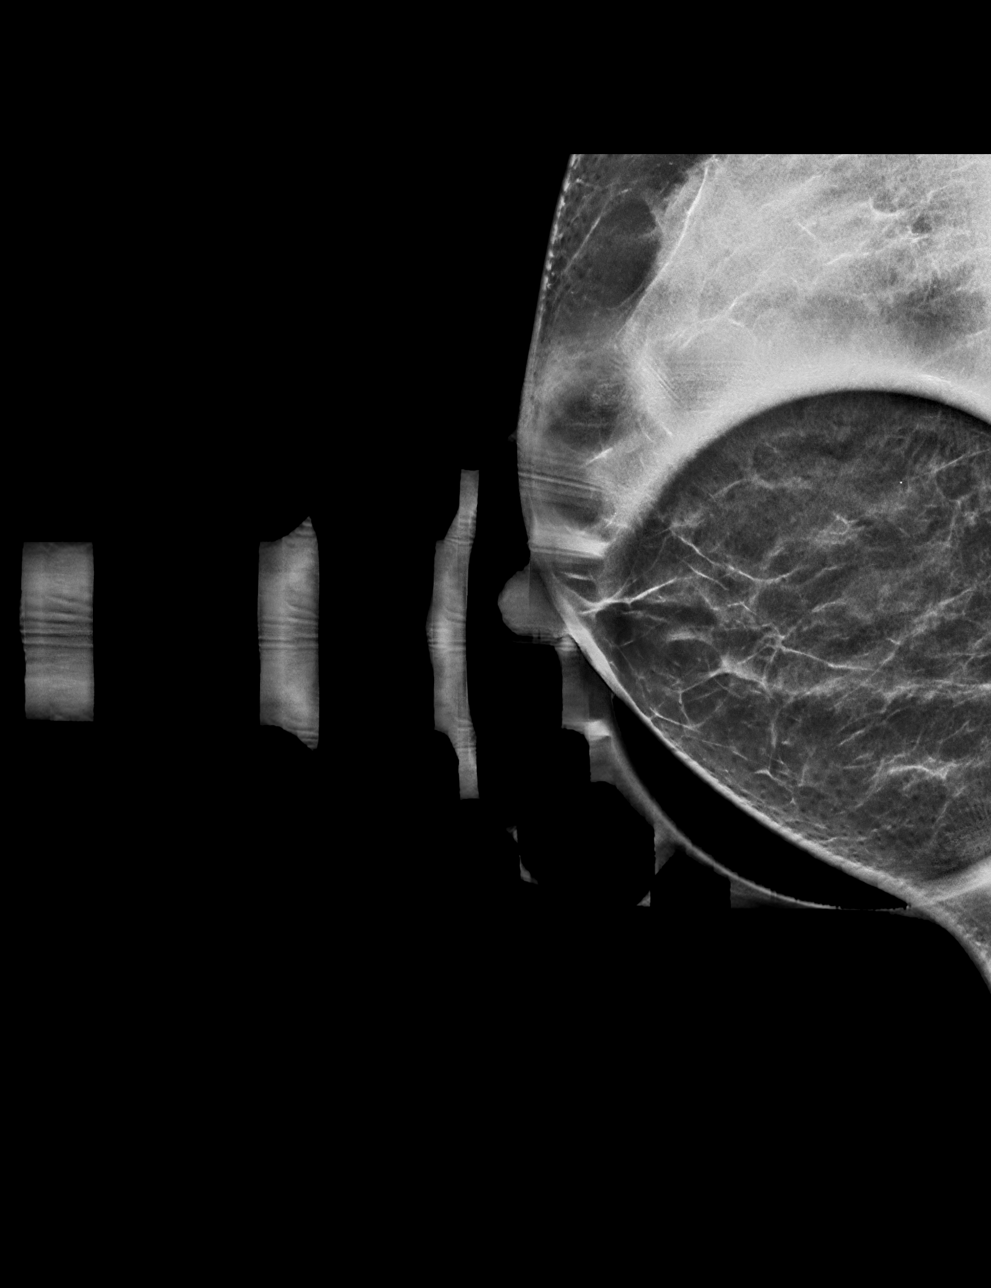

[R ML synth-2D]
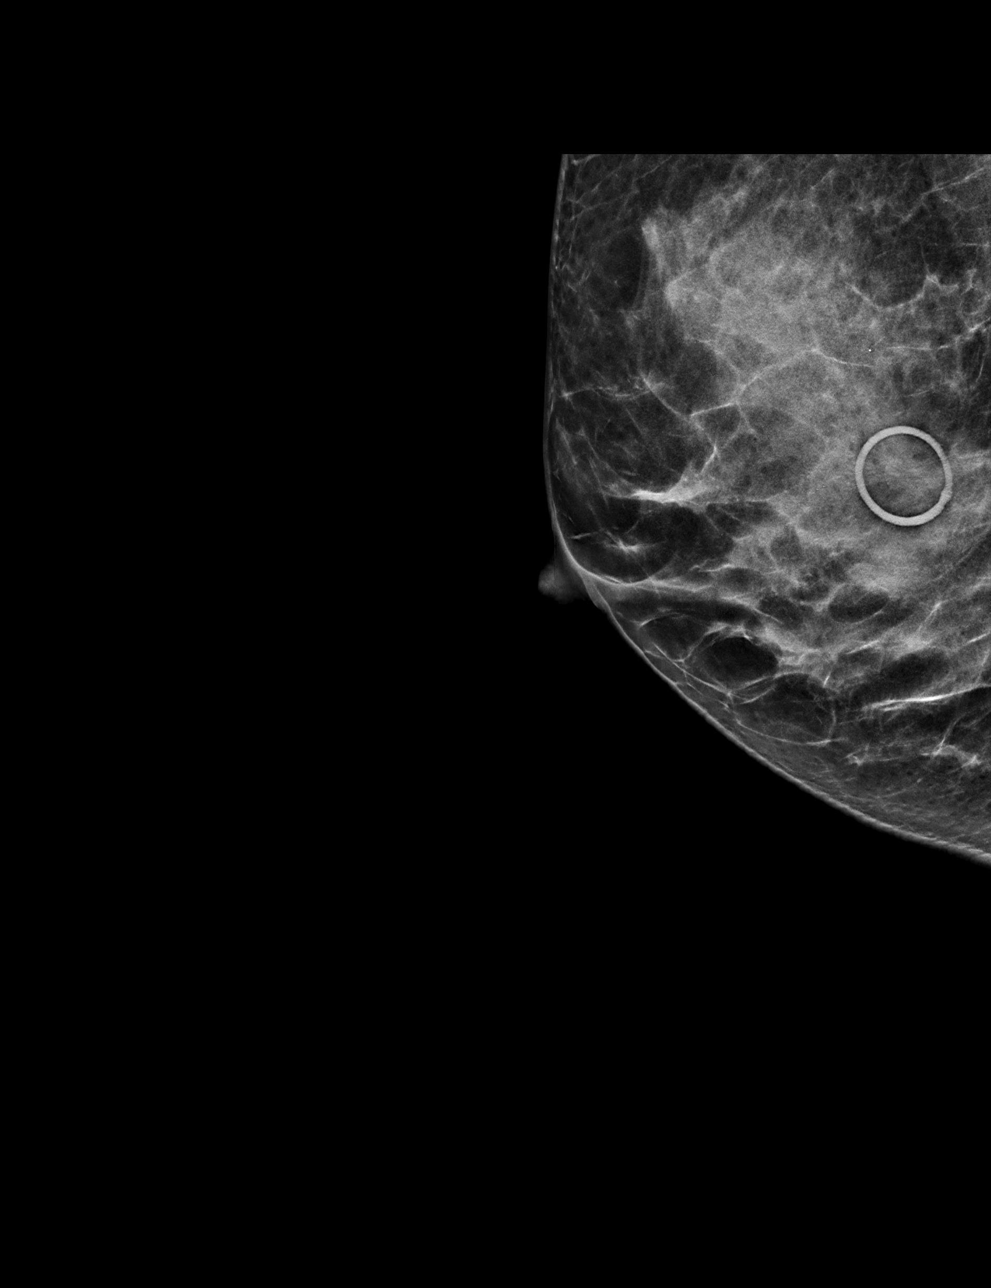

[R MLO tomo · tomo slice 26/51.0]
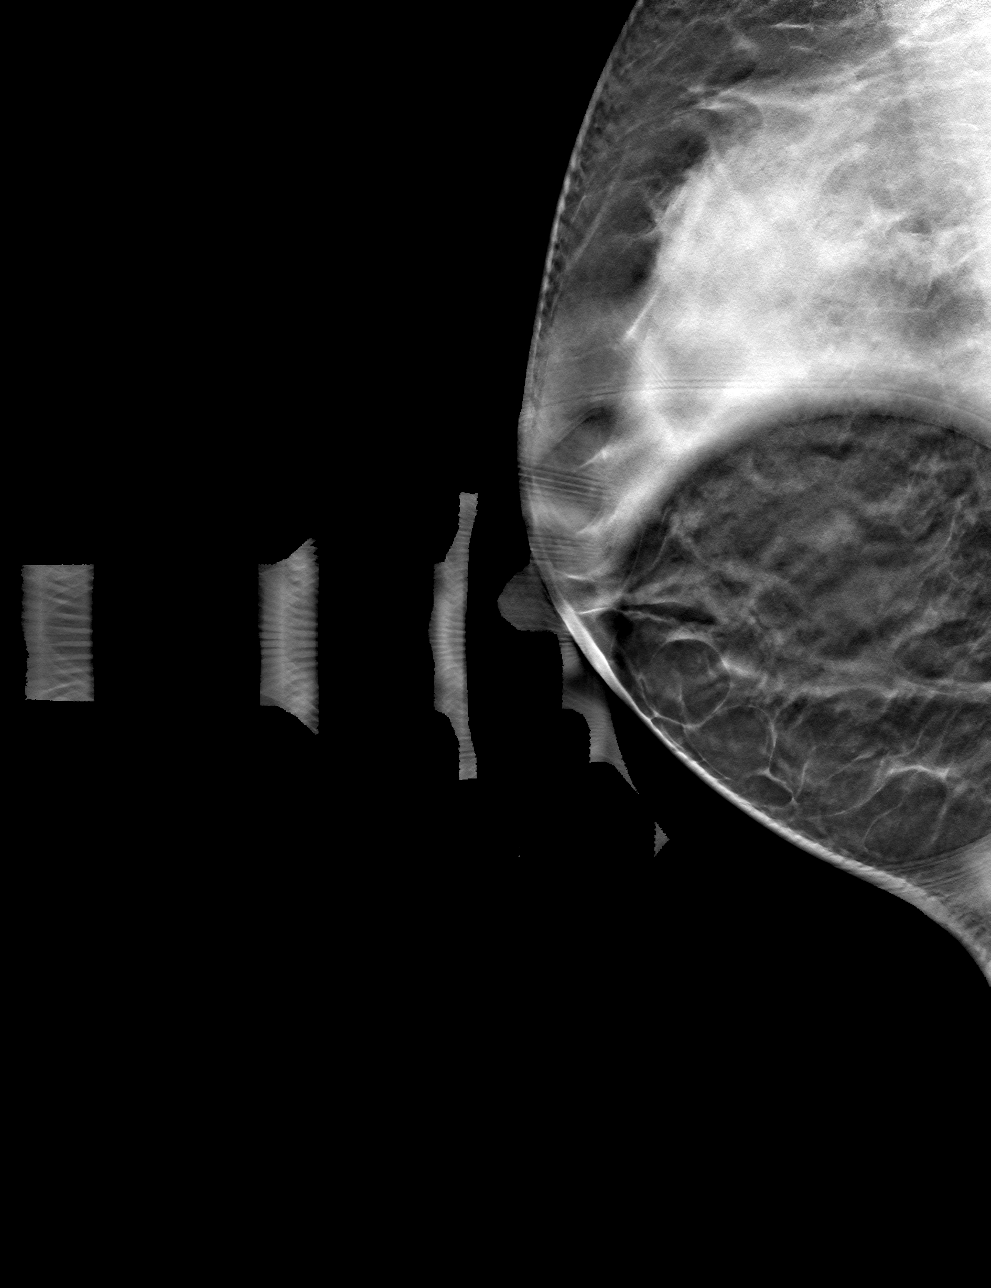

[R ML tomo · tomo slice 27/54.0]
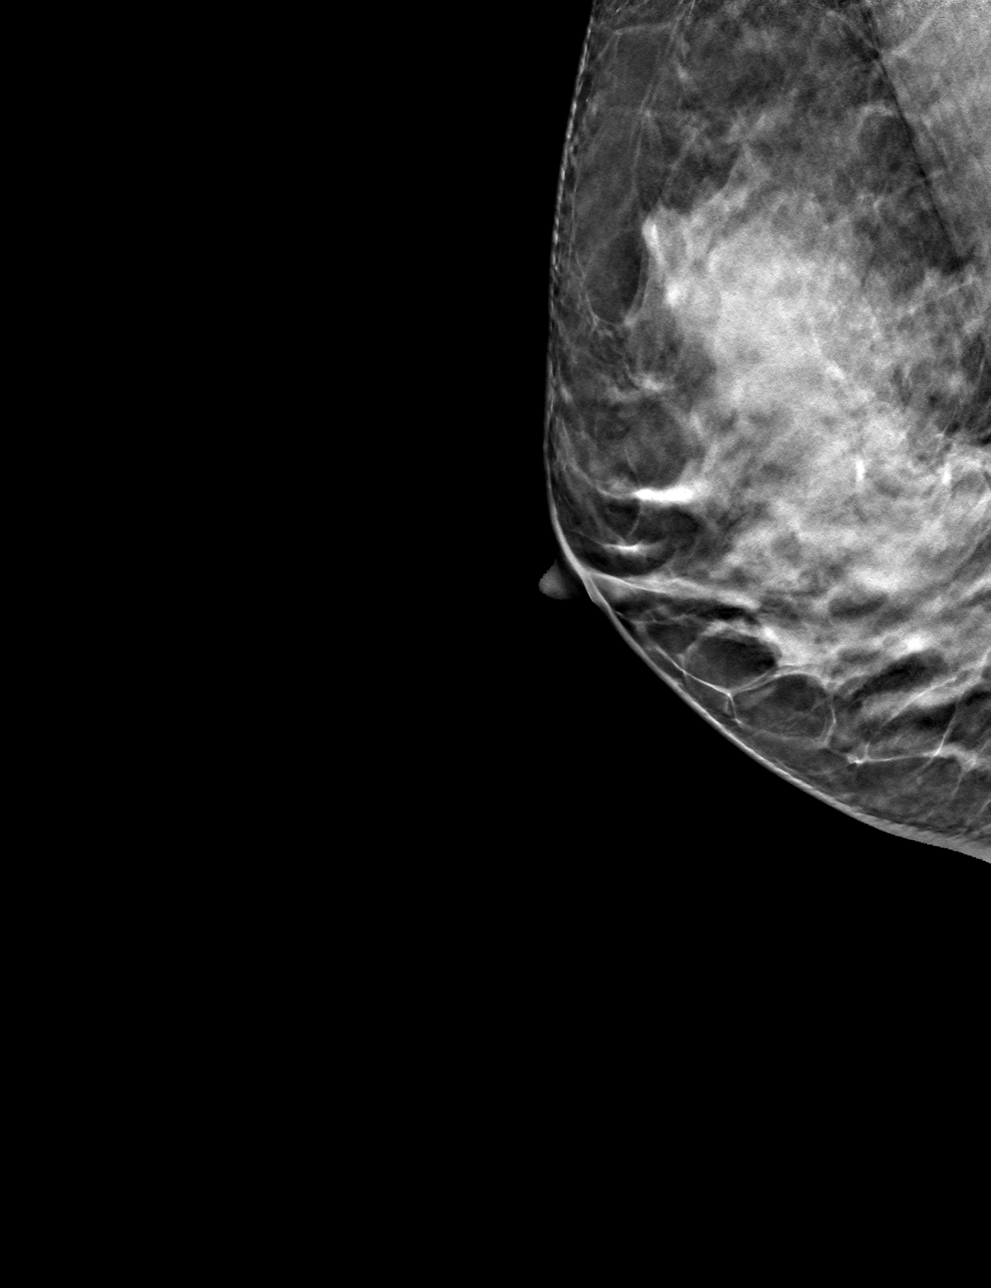

[4 of 12 positions shown; findings below may reference images not displayed]

ACR Breast Density Category c: The breast tissue is heterogeneously
dense, which may obscure small masses.
FINDINGS: Partially circumscribed, partially obscured mass in the far
posteromedial right breast is consistent with a skin mole. No
additional suspicious findings are identified.

Mammographic images were processed with CAD.
IMPRESSION: No mammographic evidence of malignancy.

RECOMMENDATION:
Screening mammogram in one year.(Code:[TI])

I have discussed the findings and recommendations with the patient.
Results were also provided in writing at the conclusion of the
visit. If applicable, a reminder letter will be sent to the patient
regarding the next appointment.

BI-RADS CATEGORY  2: Benign.

## 2019-06-01 NOTE — Telephone Encounter (Signed)
-----   Message from Virginia Crews, MD sent at 06/01/2019 10:53 AM EDT ----- Normal mammogram. Repeat in 1 yr

## 2019-06-01 NOTE — Telephone Encounter (Signed)
Patient had mammogram completed on 05/31/2019.

## 2019-06-01 NOTE — Telephone Encounter (Signed)
LMTCB 06/01/2019   Thanks,   -Mickel Baas

## 2019-06-07 NOTE — Telephone Encounter (Signed)
Left a detailed voicemail per DPR.

## 2019-08-10 MED FILL — ESCITALOPRAM 20 MG TABLET: 20 | 90 days supply | Qty: 90 | Fill #1

## 2019-08-31 ENCOUNTER — Telehealth: Payer: Self-pay

## 2019-08-31 NOTE — Telephone Encounter (Signed)
I do recommend Shingrix for all patients over 50.  Would check with insurance on coverage first.  Would wait one month to get first dose as she just got flu shot yesterday.  Could be done here or at pharmacy depending on insurance coverage

## 2019-08-31 NOTE — Telephone Encounter (Signed)
LMTCB

## 2019-08-31 NOTE — Telephone Encounter (Signed)
Patient called reporting that she received her flu shot at the pharmacy yesterday, and the pharmacist recommend that she get a Shingles vaccine. I advised her of the current recommendations for patients 94 and older. Patient wants to know what Dr. Jacinto Reap thinks/ recommends. Please advise.

## 2019-09-04 NOTE — Telephone Encounter (Signed)
lmtcb

## 2019-09-07 NOTE — Telephone Encounter (Signed)
lmtcb

## 2019-11-29 ENCOUNTER — Other Ambulatory Visit: Payer: Self-pay | Admitting: Family Medicine

## 2019-11-29 MED FILL — ESCITALOPRAM 20 MG TABLET: 20 | 90 days supply | Qty: 90 | Fill #0

## 2019-11-29 NOTE — Telephone Encounter (Signed)
Requested medication (s) are due for refill today -yes  Requested medication (s) are on the active medication list -yes  Future visit scheduled -yes  Last refill: 08/10/19  Notes to clinic: Patient fails RF protocol due to lack of appointment within a 6 month period. Patient does have appointment scheduled- but it is in June. Sent for review of RF request or call to schedule medication follow up.  Requested Prescriptions  Pending Prescriptions Disp Refills   escitalopram (LEXAPRO) 20 MG tablet [Pharmacy Med Name: ESCITALOPRAM 20 MG TABLET 20 Tablet] 90 tablet 1    Sig: Take 1 tablet (20 mg total) by mouth daily.      Psychiatry:  Antidepressants - SSRI Failed - 11/29/2019 11:54 AM      Failed - Valid encounter within last 6 months    Recent Outpatient Visits           7 months ago Encounter for annual physical exam   [facility] [place], Dionne Bucy, MD   10 months ago Bursitis of left shoulder   [facility] [place], Dionne Bucy, MD   1 year ago ETD (Eustachian tube dysfunction), bilateral   [facility] [place], Clearnce Sorrel, PA-C   1 year ago Encounter for annual physical exam   [facility], Dionne Bucy, MD   1 year ago Viral URI   [facility] [place], Dionne Bucy, MD       Future Appointments             In 4 months Bacigalupo, Dionne Bucy, MD [facility]                Requested Prescriptions  Pending Prescriptions Disp Refills   escitalopram (LEXAPRO) 20 MG tablet [Pharmacy Med Name: ESCITALOPRAM 20 MG TABLET 20 Tablet] 90 tablet 1    Sig: Take 1 tablet (20 mg total) by mouth daily.      Psychiatry:  Antidepressants - SSRI Failed - 11/29/2019 11:54 AM      Failed - Valid encounter within last 6 months    Recent Outpatient Visits           7 months ago Encounter for annual physical exam   [facility] [place], Dionne Bucy, MD   10 months ago  Bursitis of left shoulder   [facility] [place], Dionne Bucy, MD   1 year ago ETD (Eustachian tube dysfunction), bilateral   [facility] [place], Clearnce Sorrel, PA-C   1 year ago Encounter for annual physical exam   [facility], Dionne Bucy, MD   1 year ago Viral URI   [facility] Bacigal[facility], Dionne Bucy, MD       Future Appointments             In 4 months Bacigalupo, Dionne Bucy, MD [facility] [place]

## 2019-12-11 ENCOUNTER — Other Ambulatory Visit: Payer: Self-pay

## 2019-12-11 ENCOUNTER — Ambulatory Visit (INDEPENDENT_AMBULATORY_CARE_PROVIDER_SITE_OTHER): Payer: 59 | Admitting: Family Medicine

## 2019-12-11 ENCOUNTER — Encounter: Payer: Self-pay | Admitting: Family Medicine

## 2019-12-11 VITALS — BP 108/68 | Temp 96.6°F | Wt 140.0 lb

## 2019-12-11 DIAGNOSIS — M7052 Other bursitis of knee, left knee: Secondary | ICD-10-CM | POA: Diagnosis not present

## 2019-12-11 DIAGNOSIS — G5603 Carpal tunnel syndrome, bilateral upper limbs: Secondary | ICD-10-CM | POA: Diagnosis not present

## 2019-12-11 DIAGNOSIS — M7051 Other bursitis of knee, right knee: Secondary | ICD-10-CM | POA: Diagnosis not present

## 2019-12-11 DIAGNOSIS — M5442 Lumbago with sciatica, left side: Secondary | ICD-10-CM | POA: Diagnosis not present

## 2019-12-11 MED ORDER — PREDNISONE 20 MG PO TABS: ORAL_TABLET | ORAL | 0 refills | Status: DC

## 2019-12-11 MED FILL — predniSONE 20 MG TABS: 20 | 7 days supply | Qty: 13 | Fill #0

## 2019-12-11 NOTE — Patient Instructions (Signed)
Carpal Tunnel Syndrome  Carpal tunnel syndrome is a condition that causes pain in your hand and arm. The carpal tunnel is a narrow area that is on the palm side of your wrist. Repeated wrist motion or certain diseases may cause swelling in the tunnel. This swelling can pinch the main nerve in the wrist (median nerve). What are the causes? This condition may be caused by:  Repeated wrist motions.  Wrist injuries.  Arthritis.  A sac of fluid (cyst) or abnormal growth (tumor) in the carpal tunnel.  Fluid buildup during pregnancy. Sometimes the cause is not known. What increases the risk? The following factors may make you more likely to develop this condition:  Having a job in which you move your wrist in the same way many times. This includes jobs like being a butcher or a cashier.  Being a woman.  Having other health conditions, such as: ? Diabetes. ? Obesity. ? A thyroid gland that is not active enough (hypothyroidism). ? Kidney failure. What are the signs or symptoms? Symptoms of this condition include:  A tingling feeling in your fingers.  Tingling or a loss of feeling (numbness) in your hand.  Pain in your entire arm. This pain may get worse when you bend your wrist and elbow for a long time.  Pain in your wrist that goes up your arm to your shoulder.  Pain that goes down into your palm or fingers.  A weak feeling in your hands. You may find it hard to grab and hold items. You may feel worse at night. How is this diagnosed? This condition is diagnosed with a medical history and physical exam. You may also have tests, such as:  Electromyogram (EMG). This test checks the signals that the nerves send to the muscles.  Nerve conduction study. This test checks how well signals pass through your nerves.  Imaging tests, such as X-rays, ultrasound, and MRI. These tests check for what might be the cause of your condition. How is this treated? This condition may be treated  with:  Lifestyle changes. You will be asked to stop or change the activity that caused your problem.  Doing exercise and activities that make bones and muscles stronger (physical therapy).  Learning how to use your hand again (occupational therapy).  Medicines for pain and swelling (inflammation). You may have injections in your wrist.  A wrist splint.  Surgery. Follow these instructions at home: If you have a splint:  Wear the splint as told by your doctor. Remove it only as told by your doctor.  Loosen the splint if your fingers: ? Tingle. ? Lose feeling (become numb). ? Turn cold and blue.  Keep the splint clean.  If the splint is not waterproof: ? Do not let it get wet. ? Cover it with a watertight covering when you take a bath or a shower. Managing pain, stiffness, and swelling   If told, put ice on the painful area: ? If you have a removable splint, remove it as told by your doctor. ? Put ice in a plastic bag. ? Place a towel between your skin and the bag. ? Leave the ice on for 20 minutes, 2-3 times per day. General instructions  Take over-the-counter and prescription medicines only as told by your doctor.  Rest your wrist from any activity that may cause pain. If needed, talk with your boss at work about changes that can help your wrist heal.  Do any exercises as told by your doctor,   physical therapist, or occupational therapist.  Keep all follow-up visits as told by your doctor. This is important. Contact a doctor if:  You have new symptoms.  Medicine does not help your pain.  Your symptoms get worse. Get help right away if:  You have very bad numbness or tingling in your wrist or hand. Summary  Carpal tunnel syndrome is a condition that causes pain in your hand and arm.  It is often caused by repeated wrist motions.  Lifestyle changes and medicines are used to treat this problem. Surgery may help in very bad cases.  Follow your doctor's  instructions about wearing a splint, resting your wrist, keeping follow-up visits, and calling for help. This information is not intended to replace advice given to you by your health care provider. Make sure you discuss any questions you have with your health care provider. Document Revised: 03/11/2018 Document Reviewed: 03/11/2018 Elsevier Patient Education  [place]. Back Exercises These exercises help to make your trunk and back strong. They also help to keep the lower back flexible. Doing these exercises can help to prevent back pain or lessen existing pain.  If you have back pain, try to do these exercises 2-3 times each day or as told by your doctor.  As you get better, do the exercises once each day. Repeat the exercises more often as told by your doctor.  To stop back pain from coming back, do the exercises once each day, or as told by your doctor. Exercises Single knee to chest Do these steps 3-5 times in a row for each leg: 1. Lie on your back on a firm bed or the floor with your legs stretched out. 2. Bring one knee to your chest. 3. Grab your knee or thigh with both hands and hold them it in place. 4. Pull on your knee until you feel a gentle stretch in your lower back or buttocks. 5. Keep doing the stretch for 10-30 seconds. 6. Slowly let go of your leg and straighten it. Pelvic tilt Do these steps 5-10 times in a row: 1. Lie on your back on a firm bed or the floor with your legs stretched out. 2. Bend your knees so they point up to the ceiling. Your feet should be flat on the floor. 3. Tighten your lower belly (abdomen) muscles to press your lower back against the floor. This will make your tailbone point up to the ceiling instead of pointing down to your feet or the floor. 4. Stay in this position for 5-10 seconds while you gently tighten your muscles and breathe evenly. Cat-cow Do these steps until your lower back bends more easily: 1. Get on your hands and  knees on a firm surface. Keep your hands under your shoulders, and keep your knees under your hips. You may put padding under your knees. 2. Let your head hang down toward your chest. Tighten (contract) the muscles in your belly. Point your tailbone toward the floor so your lower back becomes rounded like the back of a cat. 3. Stay in this position for 5 seconds. 4. Slowly lift your head. Let the muscles of your belly relax. Point your tailbone up toward the ceiling so your back forms a sagging arch like the back of a cow. 5. Stay in this position for 5 seconds.  Press-ups Do these steps 5-10 times in a row: 1. Lie on your belly (face-down) on the floor. 2. Place your hands near your head, about shoulder-width apart.  3. While you keep your back relaxed and keep your hips on the floor, slowly straighten your arms to raise the top half of your body and lift your shoulders. Do not use your back muscles. You may change where you place your hands in order to make yourself more comfortable. 4. Stay in this position for 5 seconds. 5. Slowly return to lying flat on the floor.  Bridges Do these steps 10 times in a row: 1. Lie on your back on a firm surface. 2. Bend your knees so they point up to the ceiling. Your feet should be flat on the floor. Your arms should be flat at your sides, next to your body. 3. Tighten your butt muscles and lift your butt off the floor until your waist is almost as high as your knees. If you do not feel the muscles working in your butt and the back of your thighs, slide your feet 1-2 inches farther away from your butt. 4. Stay in this position for 3-5 seconds. 5. Slowly lower your butt to the floor, and let your butt muscles relax. If this exercise is too easy, try doing it with your arms crossed over your chest. Belly crunches Do these steps 5-10 times in a row: 1. Lie on your back on a firm bed or the floor with your legs stretched out. 2. Bend your knees so they point  up to the ceiling. Your feet should be flat on the floor. 3. Cross your arms over your chest. 4. Tip your chin a little bit toward your chest but do not bend your neck. 5. Tighten your belly muscles and slowly raise your chest just enough to lift your shoulder blades a tiny bit off of the floor. Avoid raising your body higher than that, because it can put too much stress on your low back. 6. Slowly lower your chest and your head to the floor. Back lifts Do these steps 5-10 times in a row: 1. Lie on your belly (face-down) with your arms at your sides, and rest your forehead on the floor. 2. Tighten the muscles in your legs and your butt. 3. Slowly lift your chest off of the floor while you keep your hips on the floor. Keep the back of your head in line with the curve in your back. Look at the floor while you do this. 4. Stay in this position for 3-5 seconds. 5. Slowly lower your chest and your face to the floor. Contact a doctor if:  Your back pain gets a lot worse when you do an exercise.  Your back pain does not get better 2 hours after you exercise. If you have any of these problems, stop doing the exercises. Do not do them again unless your doctor says it is okay. Get help right away if:  You have sudden, very bad back pain. If this happens, stop doing the exercises. Do not do them again unless your doctor says it is okay. This information is not intended to replace advice given to you by your health care provider. Make sure you discuss any questions you have with your health care provider. Document Revised: 07/28/2018 Document Reviewed: 07/28/2018 Elsevier Patient Education  2020 [employer].

## 2019-12-11 NOTE — Progress Notes (Signed)
Patient: Virginia [name] Female    DOB: 1966/08/14   54 y.o.   MRN: [id number] Visit Date: 12/11/2019  Today's Provider: Lavon Paganini, MD   Chief Complaint  Patient presents with  . Back Pain  . Knee Pain    Bilateral   Subjective:     Back Pain This is a recurrent problem. Episode onset: Pt states the pain started about six weeks ago. The pain is present in the lumbar spine (Left side). The pain radiates to the left foot (Left leg). The pain is moderate. The pain is worse during the night. The symptoms are aggravated by lying down and sitting. Associated symptoms include leg pain, paresthesias and tingling. Pertinent negatives include no abdominal pain, bladder incontinence, bowel incontinence, chest pain, dysuria, fever, headaches, numbness (Bilateral hands go numb), perianal numbness or weakness. She has tried analgesics and NSAIDs for the symptoms. The treatment provided no relief.  Knee Pain  The incident occurred more than 1 week ago. There was no injury mechanism. The pain is present in the left knee and right knee. The quality of the pain is described as aching. The pain is moderate. The pain has been constant since onset. Associated symptoms include tingling. Pertinent negatives include no inability to bear weight, loss of motion, loss of sensation, muscle weakness or numbness (Bilateral hands go numb). The symptoms are aggravated by movement, weight bearing and palpation. She has tried acetaminophen and NSAIDs for the symptoms. The treatment provided no relief.  few weeks  Mother has DJD and OA.  Bilateral hands go numb intermittently for several months. Getting worse.  Worse with driving, but can happen intermittently at other times.  Had pregnancy induced carpal tunnel in the past.  No Known Allergies   Current Outpatient Medications:  .  escitalopram (LEXAPRO) 20 MG tablet, TAKE 1 TABLET (20 MG TOTAL) BY MOUTH DAILY., Disp: 90 tablet, Rfl: 1 .  ibuprofen  (ADVIL,MOTRIN) 100 MG tablet, Take 200 mg by mouth every 6 (six) hours as needed for fever., Disp: , Rfl:  .  naproxen sodium (ALEVE) 220 MG tablet, Take 220 mg by mouth daily., Disp: , Rfl:   Review of Systems  Constitutional: Negative for fever.  Respiratory: Negative.   Cardiovascular: Negative.  Negative for chest pain.  Gastrointestinal: Negative.  Negative for abdominal pain and bowel incontinence.  Genitourinary: Negative.  Negative for bladder incontinence and dysuria.  Musculoskeletal: Positive for arthralgias, back pain and joint swelling. Negative for gait problem, myalgias, neck pain and neck stiffness.  Neurological: Positive for tingling and paresthesias. Negative for weakness, numbness (Bilateral hands go numb) and headaches.    Social History   Tobacco Use  . Smoking status: Never Smoker  . Smokeless tobacco: Never Used  Substance Use Topics  . Alcohol use: No    Alcohol/week: 0.0 standard drinks      Objective:   BP 108/68 (BP Location: Left Arm, Patient Position: Sitting, Cuff Size: Large)   Temp (!) 96.6 F (35.9 C) (Temporal)   Wt 140 lb (63.5 kg)   BMI 26.45 kg/m  Vitals:   12/11/19 1551  BP: 108/68  Temp: (!) 96.6 F (35.9 C)  TempSrc: Temporal  Weight: 140 lb (63.5 kg)  Body mass index is 26.45 kg/m.   Physical Exam Vitals reviewed.  Constitutional:      General: She is not in acute distress.    Appearance: Normal appearance. She is not diaphoretic.  HENT:     Head: Normocephalic and  atraumatic.  Eyes:     General: No scleral icterus.    Conjunctiva/sclera: Conjunctivae normal.  Cardiovascular:     Rate and Rhythm: Normal rate and regular rhythm.     Pulses: Normal pulses.     Heart sounds: Normal heart sounds. No murmur.  Pulmonary:     Effort: Pulmonary effort is normal. No respiratory distress.     Breath sounds: Normal breath sounds. No wheezing.  Musculoskeletal:     Right lower leg: No edema.     Left lower leg: No edema.      Comments: Back: No midline tenderness to palpation.  No paraspinal muscular spasm or tenderness to palpation.  No SI joint tenderness palpation.  Positive left straight leg raise.  Negative right straight leg raise. Bilateral knees: Range of motion is intact.  Strength is intact.  Ligaments with solid endpoints.  Negative provocative meniscal testing.  Tenderness to palpation over pes anserine bursa bilaterally.  No notable erythema, swelling, effusion. Bilateral wrists: Range of motion is intact.  Strength intact.  Sensation intact.  Positive Tinel's and Phalen's.  Skin:    General: Skin is warm and dry.     Findings: No rash.  Neurological:     Mental Status: She is alert and oriented to person, place, and time. Mental status is at baseline.     Sensory: No sensory deficit.     Motor: No weakness.     Gait: Gait normal.  Psychiatric:        Mood and Affect: Mood normal.        Behavior: Behavior normal.      No results found for any visits on 12/11/19.     Assessment & Plan    1. Acute left-sided low back pain with left-sided sciatica -Recurrent problem -No red flag symptoms -No history of cancer -Exam is consistent with left sciatica -Discussed importance of staying active and avoiding bedrest -Home exercise program given -No indication for imaging at this time -Discussed that most low back pain is self-limited -Can use Tylenol, heat, ice as needed -Prednisone burst and taper -Discussed return precautions  2. Pes anserinus bursitis of both knees -New problem -She is point tender over her pes anserine bursa with otherwise benign knee exam today -No indication for imaging at this time -Would consider corticosteroid injection, but we are giving prednisone burst and taper as above for sciatica, which should treat this as well -Discussed icing and home exercise program -Discussed return precautions  3. Bilateral carpal tunnel syndrome -Longstanding and intermittent problem  -She does drive for more than 1 hour/day and sits in a position that worsens this -She is discovered the sleeping with her her wrists cocked backwards seems to help -Discussed cock-up wrist splints for sleeping -Avoid positioning that worsens this -Can use NSAIDs/Tylenol as needed -Discussed return precautions   Meds ordered this encounter  Medications  . predniSONE (DELTASONE) 20 MG tablet    Sig: Take 60mg  PO daily x 2 days, then40mg  PO daily x 2 days, then 20mg  PO daily x 3 days    Dispense:  13 tablet    Refill:  0     Return if symptoms worsen or fail to improve.   The entirety of the information documented in the History of Present Illness, Review of Systems and Physical Exam were personally obtained by me. Portions of this information were initially documented by Ashley Royalty, [name] and reviewed by me for thoroughness and accuracy.    Virginia Crews,  MD MPH [facility]

## 2020-01-24 ENCOUNTER — Other Ambulatory Visit: Payer: Self-pay

## 2020-01-24 ENCOUNTER — Ambulatory Visit (INDEPENDENT_AMBULATORY_CARE_PROVIDER_SITE_OTHER): Payer: 59 | Admitting: Advanced Practice Midwife

## 2020-01-24 ENCOUNTER — Encounter: Payer: Self-pay | Admitting: Advanced Practice Midwife

## 2020-01-24 ENCOUNTER — Other Ambulatory Visit (HOSPITAL_COMMUNITY)
Admission: RE | Admit: 2020-01-24 | Discharge: 2020-01-24 | Disposition: A | Discharge status: Home / Self Care | Payer: 59 | Source: Ambulatory Visit | Attending: Advanced Practice Midwife | Admitting: Advanced Practice Midwife

## 2020-01-24 VITALS — BP 100/60 | Ht 61.0 in | Wt 137.0 lb

## 2020-01-24 DIAGNOSIS — Z01419 Encounter for gynecological examination (general) (routine) without abnormal findings: Secondary | ICD-10-CM

## 2020-01-24 DIAGNOSIS — Z124 Encounter for screening for malignant neoplasm of cervix: Secondary | ICD-10-CM | POA: Insufficient documentation

## 2020-01-24 DIAGNOSIS — N6315 Unspecified lump in the right breast, overlapping quadrants: Secondary | ICD-10-CM | POA: Diagnosis not present

## 2020-01-24 DIAGNOSIS — Z01411 Encounter for gynecological examination (general) (routine) with abnormal findings: Secondary | ICD-10-CM | POA: Diagnosis not present

## 2020-01-24 NOTE — Progress Notes (Signed)
Gynecology Annual Exam  Date of Service: 01/24/2020  PCP: Virginia Crews, MD  Chief Complaint:  Chief Complaint  Patient presents with  . Gynecologic Exam  . Breast Mass    right side     History of Present Illness:Patient is a 54 y.o. [name] presents for annual exam. The patient has no gyn complaints today. She has complaint of tender lump in her right breast that she first noticed about 1.5 weeks ago while in the shower.   LMP: No LMP recorded. Patient is perimenopausal. She thinks her last period was about 1 year ago and does not remember the exact month. Intermenstrual Bleeding: not applicable Postcoital Bleeding: no Dysmenorrhea: not applicable  The patient is sexually active. She denies dyspareunia.  The patient does perform self breast exams.  There is no notable family history of breast or ovarian cancer in her family.  The patient wears seatbelts: yes.   The patient has regular exercise: she walks regularly. She admits healthy diet and probably inadequate hydration. She admits 7-8 hours sleep per night.    The patient denies current symptoms of depression.  She reports current dose of Lexapro is effective.  Review of Systems: Review of Systems  Constitutional: Negative.   HENT: Negative.   Eyes: Negative.   Respiratory: Negative.   Cardiovascular: Negative.   Gastrointestinal: Negative.   Genitourinary: Negative.   Musculoskeletal: Positive for joint pain.       Joint inflammation  Skin: Negative.   Neurological: Negative.   Endo/Heme/Allergies:       Hot flashes  Psychiatric/Behavioral:       Anxiety  Breast:      Lump in right breast at outer margin   Past Medical History:  Patient Active Problem List   Diagnosis Date Noted  . Special screening for malignant neoplasms, colon   . Disordered eating 09/30/2017  . Avitaminosis D 09/30/2017  . Family history of thyroid disease 09/30/2017  . Fibroid 08/31/2017  . Irregular bleeding 08/19/2017    . Arthralgia of hip 10/07/2015    Past Surgical History:  Past Surgical History:  Procedure Laterality Date  . CLEFT PALATE REPAIR    . CLEFT PALATE REPAIR  1970  . COLONOSCOPY WITH PROPOFOL N/A 04/25/2018   Procedure: COLONOSCOPY WITH PROPOFOL;  Surgeon: [name], MD;  Location: [facility];  Service: Endoscopy;  Laterality: N/A;    Gynecologic History:  No LMP recorded. Patient is perimenopausal. Last Pap: 3 years ago Results were:  no abnormalities  Last mammogram: 1 year ago Results were: Bi Rads 0 with follow up and normal findings  Obstetric History: [name]  Family History:  Family History  Problem Relation Age of Onset  . Anxiety disorder Mother   . Heart disease Father 90       CABG x4  . Diabetes Father   . Hypertension Father   . Hypothyroidism Father   . Anxiety disorder Daughter   . Heart disease Maternal Grandmother   . Stroke Maternal Grandmother 68  . Heart disease Paternal Aunt   . Stroke Paternal Aunt   . Heart disease Paternal Uncle   . Pancreatic cancer Paternal Uncle   . Hypertension Sister   . Hyperlipidemia Sister   . Hypothyroidism Sister   . Lung cancer Maternal Uncle   . Healthy Son   . Colon cancer Neg Hx   . Breast cancer Neg Hx   . Ovarian cancer Neg Hx   . Cervical cancer Neg Hx  Social History:  Social History   Socioeconomic History  . Marital status: Married    Spouse name: Virginia [name]  . Number of children: 2  . Years of education: 16  . Highest education level: Bachelor's degree (e.g., BA, AB, BS)  Occupational History  . Occupation: works on [employer]  Tobacco Use  . Smoking status: Never Smoker  . Smokeless tobacco: Never Used  Substance and Sexual Activity  . Alcohol use: No    Alcohol/week: 0.0 standard drinks  . Drug use: No  . Sexual activity: Yes    Partners: Male    Birth control/protection: Surgical    Comment: husband s/p vasectomy  Other Topics Concern  . Not on file   Social History Narrative  . Not on file   Social Determinants of Health   Financial Resource Strain:   . Difficulty of Paying Living Expenses: Not on file  Food Insecurity:   . Worried About Charity fundraiser in the Last Year: Not on file  . Ran Out of Food in the Last Year: Not on file  Transportation Needs:   . Lack of Transportation (Medical): Not on file  . Lack of Transportation (Non-Medical): Not on file  Physical Activity:   . Days of Exercise per Week: Not on file  . Minutes of Exercise per Session: Not on file  Stress:   . Feeling of Stress : Not on file  Social Connections:   . Frequency of Communication with Friends and Family: Not on file  . Frequency of Social Gatherings with Friends and Family: Not on file  . Attends Religious Services: Not on file  . Active Member of Clubs or Organizations: Not on file  . Attends Archivist Meetings: Not on file  . Marital Status: Not on file  Intimate Partner Violence:   . Fear of Current or Ex-Partner: Not on file  . Emotionally Abused: Not on file  . Physically Abused: Not on file  . Sexually Abused: Not on file    Allergies:  No Known Allergies  Medications: Prior to Admission medications   Medication Sig Start Date End Date Taking? Authorizing Provider  escitalopram (LEXAPRO) 20 MG tablet TAKE 1 TABLET (20 MG TOTAL) BY MOUTH DAILY. 11/29/19  Yes Bacigalupo, Dionne Bucy, MD  ibuprofen (ADVIL,MOTRIN) 100 MG tablet Take 200 mg by mouth every 6 (six) hours as needed for fever.    [provider]  naproxen sodium (ALEVE) 220 MG tablet Take 220 mg by mouth daily.    [provider]    Physical Exam Vitals: Blood pressure 100/60, height 5\' 1"  (1.549 m), weight 137 lb (62.1 kg).  General: NAD HEENT: normocephalic, anicteric Thyroid: no enlargement, no palpable nodules Pulmonary: No increased work of breathing, CTAB Cardiovascular: RRR, distal pulses 2+ Breast: Breast symmetrical, no  tenderness, no palpable nodules or masses in left breast, no skin or nipple retraction present, no nipple discharge.  No axillary or supraclavicular lymphadenopathy. Right breast with non-tender, non-mobile, palpable nodule about 1 cm circular, at 9:30, at outer margin of breast Abdomen: NABS, soft, non-tender, non-distended.  Umbilicus without lesions.  No hepatomegaly, splenomegaly or masses palpable. No evidence of hernia  Genitourinary:  External: Normal external female genitalia.  Normal urethral meatus, normal Bartholin's and Skene's glands.    Vagina: Normal vaginal mucosa, no evidence of prolapse.    Cervix: Grossly normal in appearance, no bleeding, no CMT  Uterus: Non-enlarged, mobile, normal contour.    Adnexa: ovaries  non-enlarged, no adnexal masses  Rectal: deferred  Lymphatic: no evidence of inguinal lymphadenopathy Extremities: no edema, erythema, or tenderness Neurologic: Grossly intact Psychiatric: mood appropriate, affect full     Assessment: 54 y.o. [name] routine annual exam  Plan: Problem List Items Addressed This Visit    None    Visit Diagnoses    Well woman exam with routine gynecological exam    -  Primary   Relevant Orders   MM DIAG BREAST TOMO BILATERAL   US BREAST LTD UNI LEFT INC AXILLA   US BREAST LTD UNI RIGHT INC AXILLA   Cytology - PAP   Breast lump on right side at 9 o'clock position       Relevant Orders   MM DIAG BREAST TOMO BILATERAL   US BREAST LTD UNI LEFT INC AXILLA   US BREAST LTD UNI RIGHT INC AXILLA   Cervical cancer screening       Relevant Orders   Cytology - PAP      1) Mammogram - recommend yearly screening mammogram.  Mammogram diagnostic ordered today  2) STI screening  was offered and declined  3) ASCCP guidelines and rationale discussed.  Patient opts for every 5 years screening interval  4) Osteoporosis  - per USPTF routine screening DEXA at age 26   Consider FDA-approved medical therapies in postmenopausal women  and men aged 72 years and older, based on the following: a) A hip or vertebral (clinical or morphometric) fracture b) T-score ? -2.5 at the femoral neck or spine after appropriate evaluation to exclude secondary causes C) Low bone mass (T-score between -1.0 and -2.5 at the femoral neck or spine) and a 10-year probability of a hip fracture ? 3% or a 10-year probability of a major osteoporosis-related fracture ? 20% based on the [place]-adapted WHO algorithm   5) Routine healthcare maintenance including cholesterol, diabetes screening discussed managed by PCP  6) Colonoscopy is up to date.  Screening recommended starting at age 93 for average risk individuals, age 16 for individuals deemed at increased risk (including African Americans) and recommended to continue until age 16.  For patient age 87-85 individualized approach is recommended.  Gold standard screening is via colonoscopy, Cologuard screening is an acceptable alternative for patient unwilling or unable to undergo colonoscopy.  "Colorectal cancer screening for average?risk adults: 2018 guideline update from the American Cancer Society"[place]: A Cancer Journal for Clinicians: Apr 14, 2017   7) Return in about 1 year (around 01/23/2021) for annual established gyn.    Rod Can, [facility] 01/24/2020, 4:29 PM

## 2020-01-25 ENCOUNTER — Telehealth: Payer: Self-pay | Admitting: Advanced Practice Midwife

## 2020-01-25 ENCOUNTER — Other Ambulatory Visit: Payer: Self-pay | Admitting: Advanced Practice Midwife

## 2020-01-25 DIAGNOSIS — Z01419 Encounter for gynecological examination (general) (routine) without abnormal findings: Secondary | ICD-10-CM

## 2020-01-25 DIAGNOSIS — N6315 Unspecified lump in the right breast, overlapping quadrants: Secondary | ICD-10-CM

## 2020-01-25 NOTE — Telephone Encounter (Signed)
PT called, a new breast order needs to be sent in just for the affected breast, not both. 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 Thank you

## 2020-01-25 NOTE — Telephone Encounter (Signed)
I just signed what looks like new orders.

## 2020-01-26 LAB — CYTOLOGY - PAP
Comment: NEGATIVE
Diagnosis: NEGATIVE
High risk HPV: NEGATIVE

## 2020-02-01 ENCOUNTER — Other Ambulatory Visit: Payer: Self-pay

## 2020-02-01 ENCOUNTER — Ambulatory Visit
Admission: RE | Admit: 2020-02-01 | Discharge: 2020-02-01 | Disposition: A | Discharge status: Home / Self Care | Payer: 59 | Source: Ambulatory Visit | Attending: Advanced Practice Midwife | Admitting: Advanced Practice Midwife

## 2020-02-01 ENCOUNTER — Other Ambulatory Visit: Payer: Self-pay | Admitting: Advanced Practice Midwife

## 2020-02-01 DIAGNOSIS — N6489 Other specified disorders of breast: Secondary | ICD-10-CM | POA: Diagnosis not present

## 2020-02-01 DIAGNOSIS — N6315 Unspecified lump in the right breast, overlapping quadrants: Secondary | ICD-10-CM

## 2020-02-01 DIAGNOSIS — Z01419 Encounter for gynecological examination (general) (routine) without abnormal findings: Secondary | ICD-10-CM

## 2020-02-01 DIAGNOSIS — R922 Inconclusive mammogram: Secondary | ICD-10-CM | POA: Diagnosis not present

## 2020-02-01 IMAGING — MG MM DIGITAL DIAGNOSTIC UNILAT*R* W/ TOMO W/ CAD
6 series · 6 of 18 positions shown · non-contrast
Comparison: Previous exam(s).

CLINICAL DATA: 54-year-old female presenting for evaluation of a
palpable lump in the upper-outer quadrant of the right breast. The
lump was previously tender, but has improved.

EXAM:
DIGITAL DIAGNOSTIC RIGHT MAMMOGRAM WITH CAD AND TOMO
ULTRASOUND RIGHT BREAST

[R CC synth-2D]
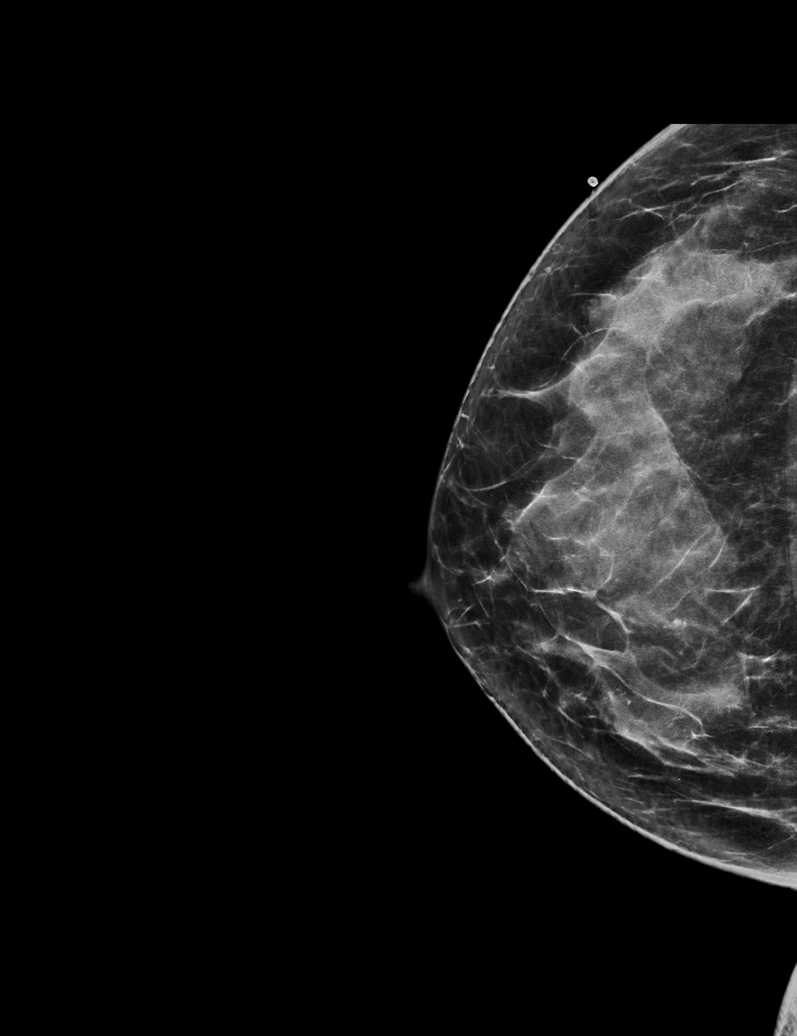

[R TAN synth-2D]
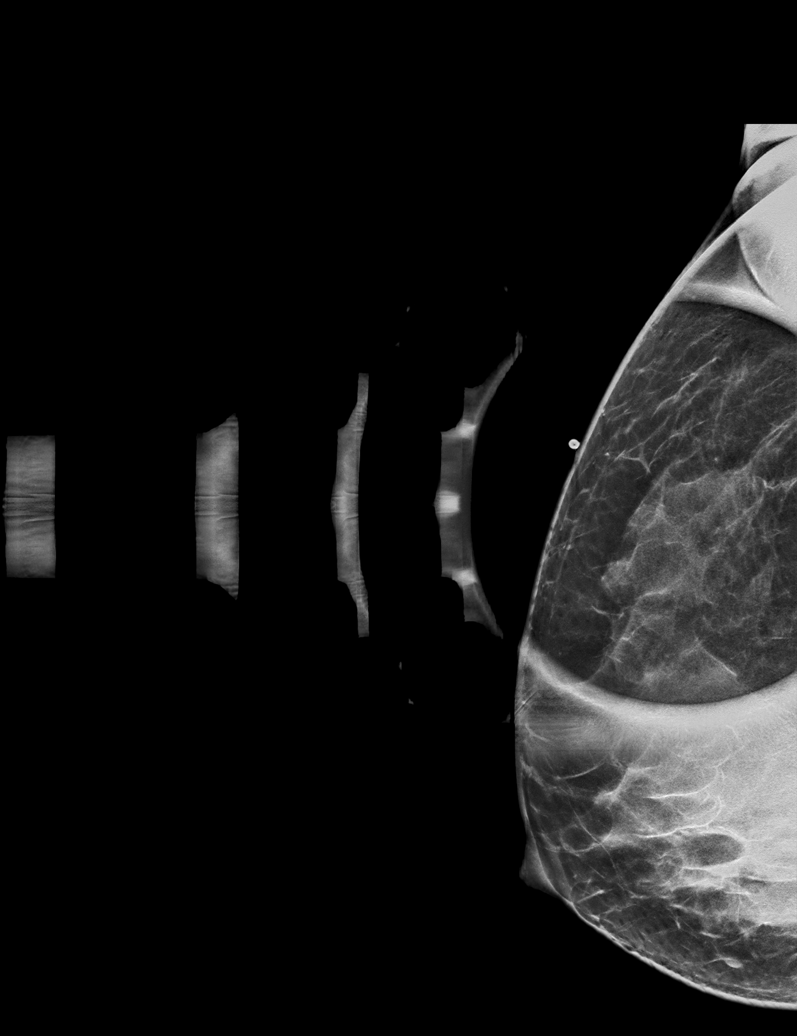

[R MLO synth-2D]
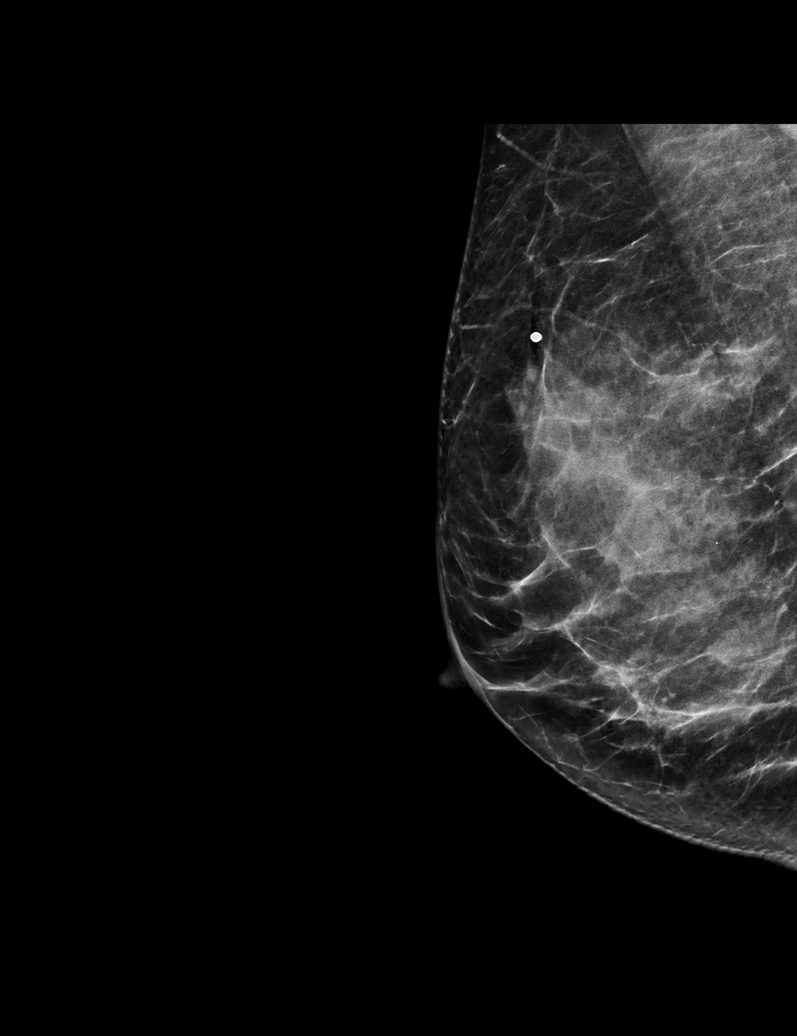

[R MLO tomo · tomo slice 31/60.0]
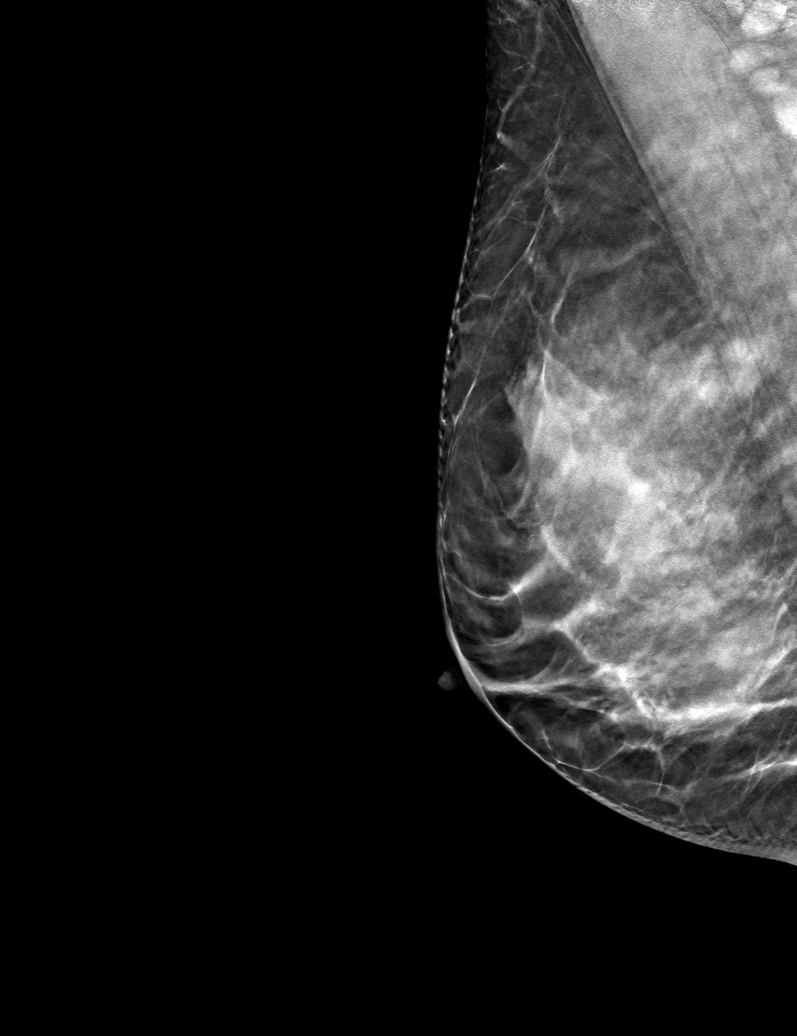

[R CC tomo · tomo slice 32/63.0]
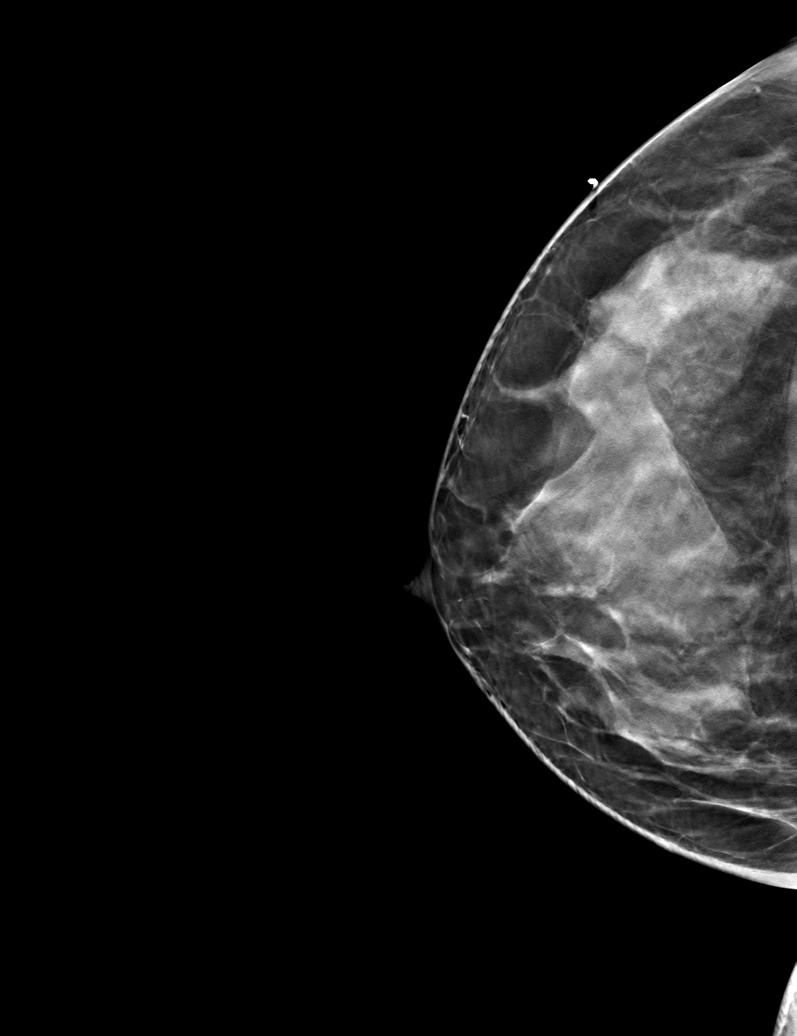

[R TAN tomo · tomo slice 28/55.0]
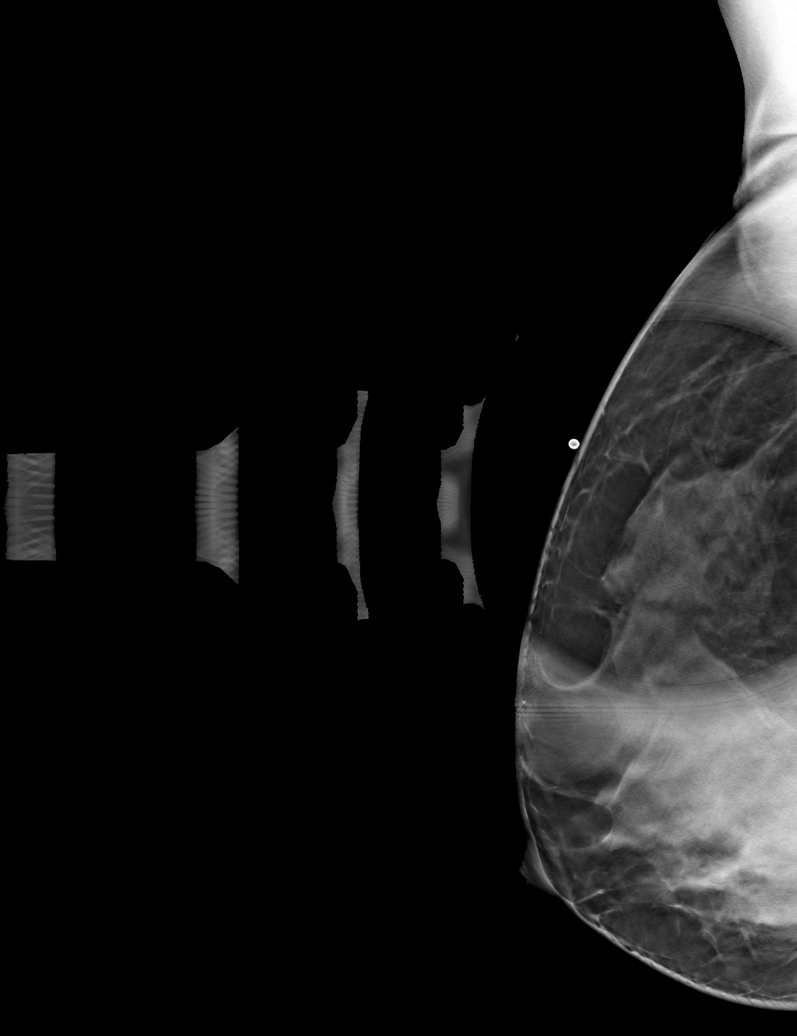

[6 of 18 positions shown; findings below may reference images not displayed]

ACR Breast Density Category d: The breast tissue is extremely dense,
which lowers the sensitivity of mammography.
FINDINGS: A BB has been placed on the upper-outer quadrant of the right breast
indicating the palpable site of concern. No suspicious findings are
seen deep to the marker. However, due to the extreme density of the
surrounding breast tissue, ultrasound will be performed for further
evaluation.

Mammographic images were processed with CAD.

On physical exam, there is a firm palpable mass in the upper-outer
quadrant of the right breast at the site of concern.

Targeted ultrasound is performed, showing an irregular hypoechoic
mass with indistinct margins measuring 2.0 x 1.1 x 1.7 cm at 10
o'clock, 6 cm from the nipple. Blood flow seen within the mass on
color Doppler imaging. Ultrasound of the right axilla demonstrates
multiple normal-appearing lymph nodes.
IMPRESSION: 1. There is a highly suspicious 2.0 cm mass at the palpable site in
the right breast at 10 o'clock.

2.  No evidence of right axillary lymphadenopathy.

RECOMMENDATION:
Ultrasound guided biopsy is recommended for the right breast mass,
and has been scheduled for [DATE] at [DATE] p.m.

I have discussed the findings and recommendations with the patient.
If applicable, a reminder letter will be sent to the patient
regarding the next appointment.

BI-RADS CATEGORY  5: Highly suggestive of malignancy.

## 2020-02-01 IMAGING — US US BREAST*R* LIMITED INC AXILLA
1 series · 9 of 9 positions shown · non-contrast
Comparison: Previous exam(s).

CLINICAL DATA: 54-year-old female presenting for evaluation of a
palpable lump in the upper-outer quadrant of the right breast. The
lump was previously tender, but has improved.

EXAM:
DIGITAL DIAGNOSTIC RIGHT MAMMOGRAM WITH CAD AND TOMO
ULTRASOUND RIGHT BREAST

[Series 1: us breast*right* limited inc axilla · 0.06mm/px · 9 of 9 slices shown]
[im 1/9]
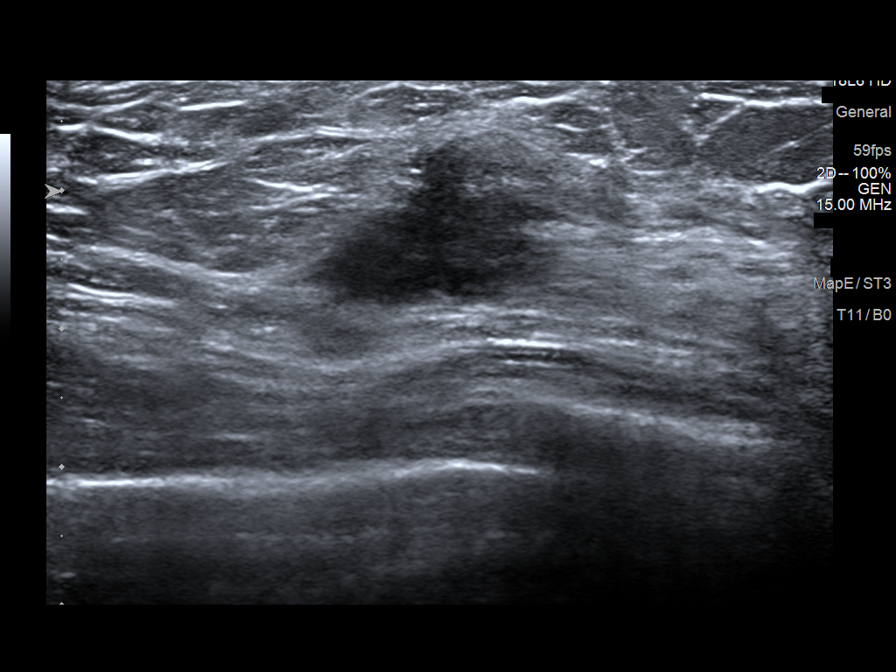
[im 2/9]
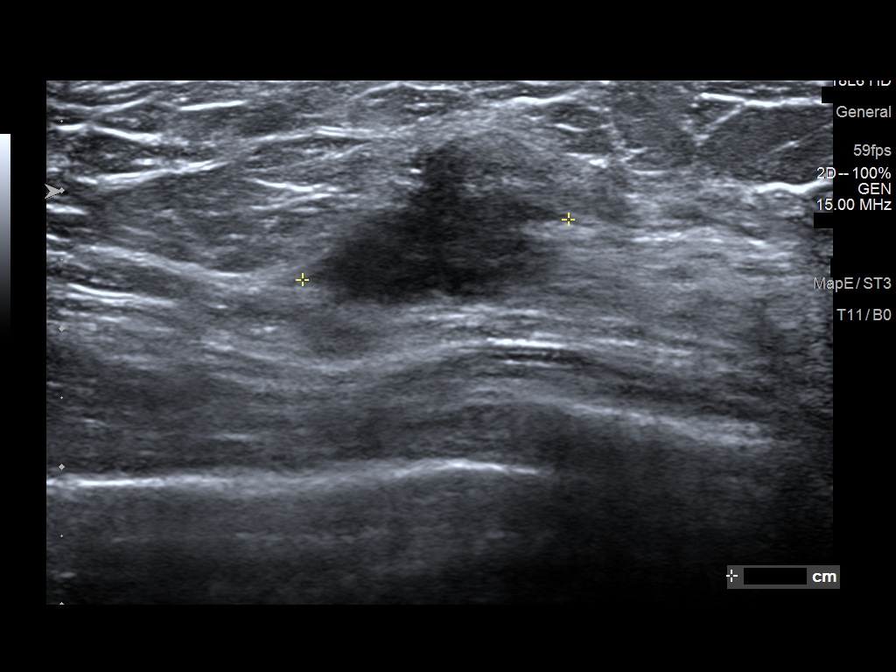
[im 3/9]
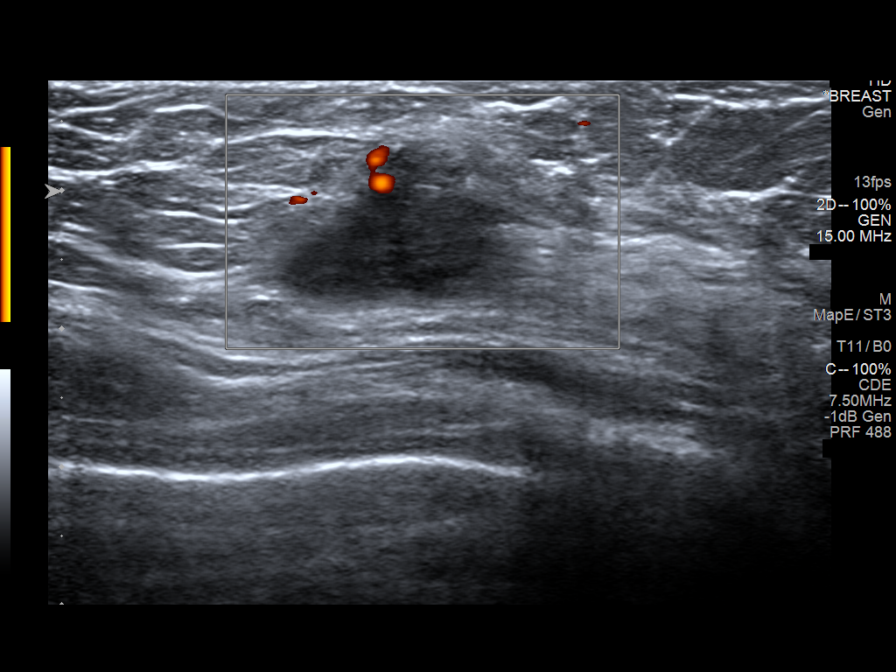
[im 4/9]
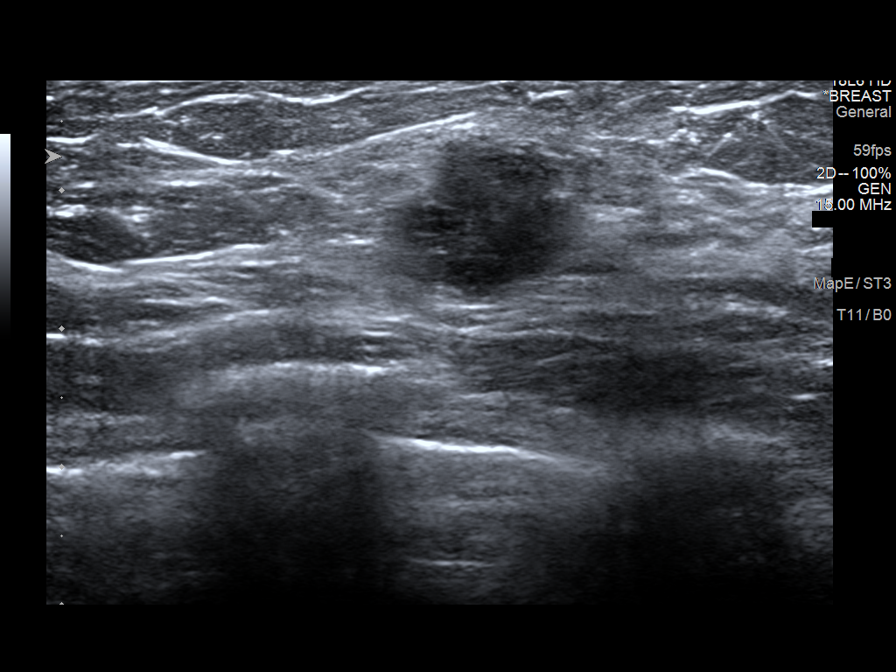
[im 5/9]
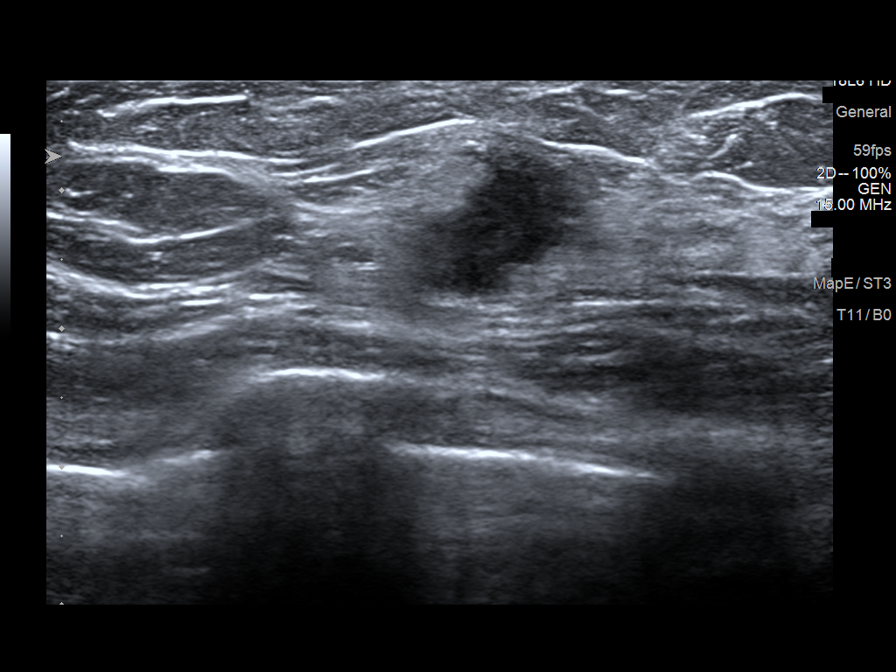
[im 6/9]
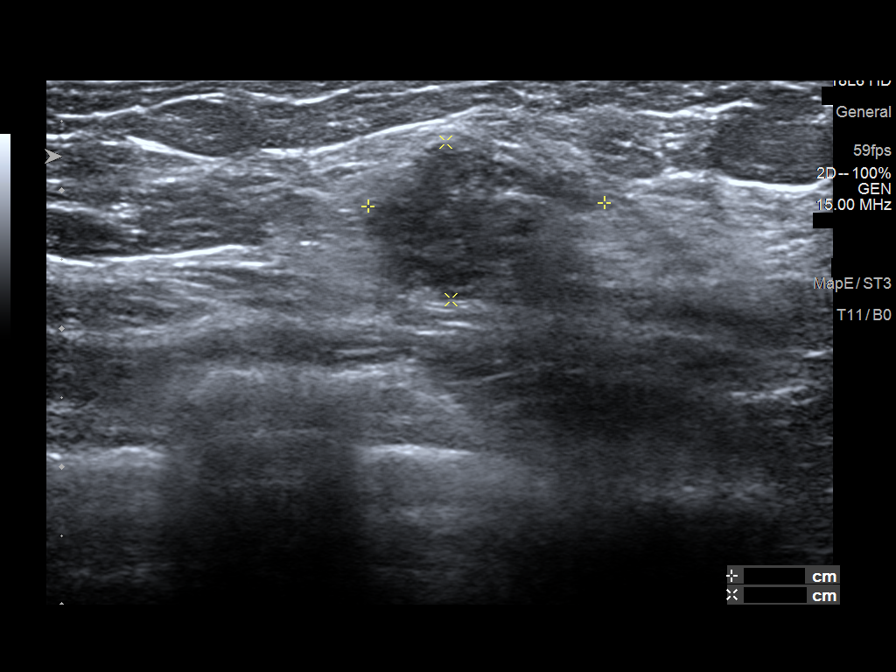
[im 7/9]
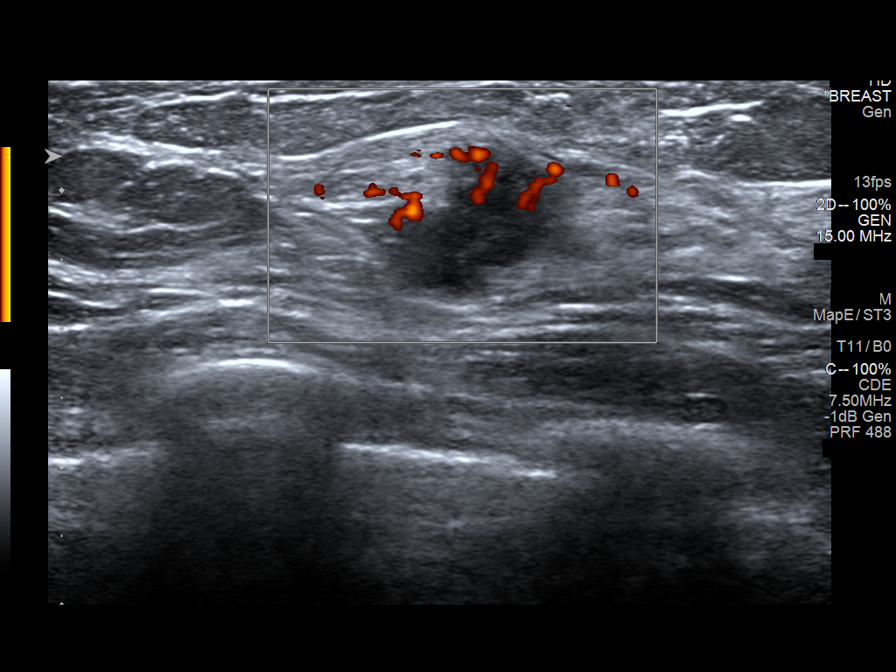
[im 8/9]
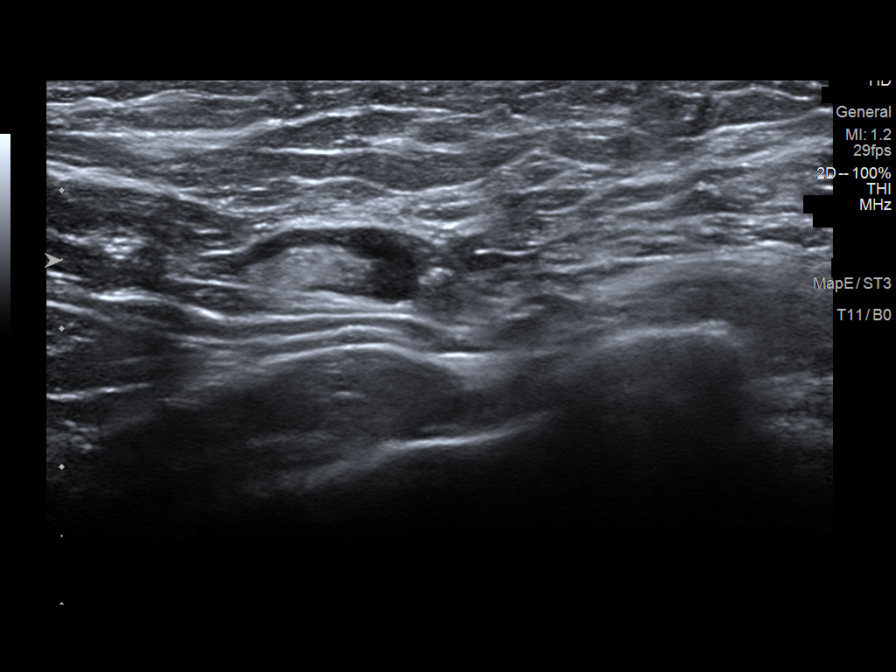
[im 9/9]
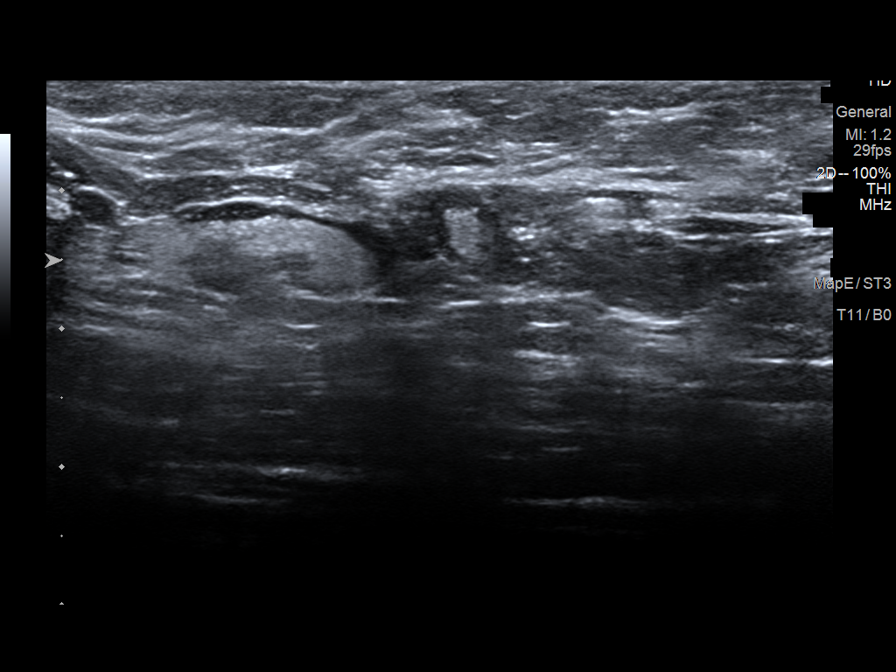

[9 of 9 positions shown; findings below may reference images not displayed]

ACR Breast Density Category d: The breast tissue is extremely dense,
which lowers the sensitivity of mammography.
FINDINGS: A BB has been placed on the upper-outer quadrant of the right breast
indicating the palpable site of concern. No suspicious findings are
seen deep to the marker. However, due to the extreme density of the
surrounding breast tissue, ultrasound will be performed for further
evaluation.

Mammographic images were processed with CAD.

On physical exam, there is a firm palpable mass in the upper-outer
quadrant of the right breast at the site of concern.

Targeted ultrasound is performed, showing an irregular hypoechoic
mass with indistinct margins measuring 2.0 x 1.1 x 1.7 cm at 10
o'clock, 6 cm from the nipple. Blood flow seen within the mass on
color Doppler imaging. Ultrasound of the right axilla demonstrates
multiple normal-appearing lymph nodes.
IMPRESSION: 1. There is a highly suspicious 2.0 cm mass at the palpable site in
the right breast at 10 o'clock.

2.  No evidence of right axillary lymphadenopathy.

RECOMMENDATION:
Ultrasound guided biopsy is recommended for the right breast mass,
and has been scheduled for [DATE] at [DATE] p.m.

I have discussed the findings and recommendations with the patient.
If applicable, a reminder letter will be sent to the patient
regarding the next appointment.

BI-RADS CATEGORY  5: Highly suggestive of malignancy.

## 2020-02-02 ENCOUNTER — Other Ambulatory Visit: Payer: Self-pay

## 2020-02-02 ENCOUNTER — Ambulatory Visit
Admission: RE | Admit: 2020-02-02 | Discharge: 2020-02-02 | Disposition: A | Discharge status: Home / Self Care | Payer: 59 | Source: Ambulatory Visit | Attending: Advanced Practice Midwife | Admitting: Advanced Practice Midwife

## 2020-02-02 DIAGNOSIS — Z01419 Encounter for gynecological examination (general) (routine) without abnormal findings: Secondary | ICD-10-CM

## 2020-02-02 DIAGNOSIS — N6315 Unspecified lump in the right breast, overlapping quadrants: Secondary | ICD-10-CM

## 2020-02-02 DIAGNOSIS — N6311 Unspecified lump in the right breast, upper outer quadrant: Secondary | ICD-10-CM | POA: Diagnosis not present

## 2020-02-02 DIAGNOSIS — C50411 Malignant neoplasm of upper-outer quadrant of right female breast: Secondary | ICD-10-CM | POA: Diagnosis not present

## 2020-02-02 IMAGING — US US  BREAST BX W/ LOC DEV 1ST LESION IMG BX SPEC US GUIDE*R*
1 series · 13 of 14 positions shown · non-contrast
Comparison: Previous exam(s).
COMPARISON: Previous exam(s).

Addendum:
CLINICAL DATA: 54-year-old female for tissue sampling of 2 cm
UPPER-OUTER RIGHT breast mass.

EXAM:
ULTRASOUND GUIDED RIGHT BREAST CORE NEEDLE BIOPSY

[Series 1: us breast bx w/ loc dev 1st lesion img bx spec us  · 0.06mm/px · 13 of 14 slices shown]
[im 1/14]
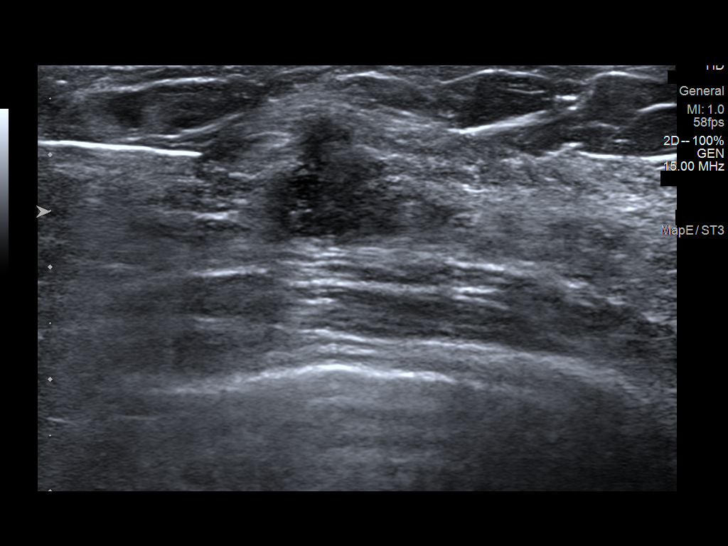
[im 2/14]
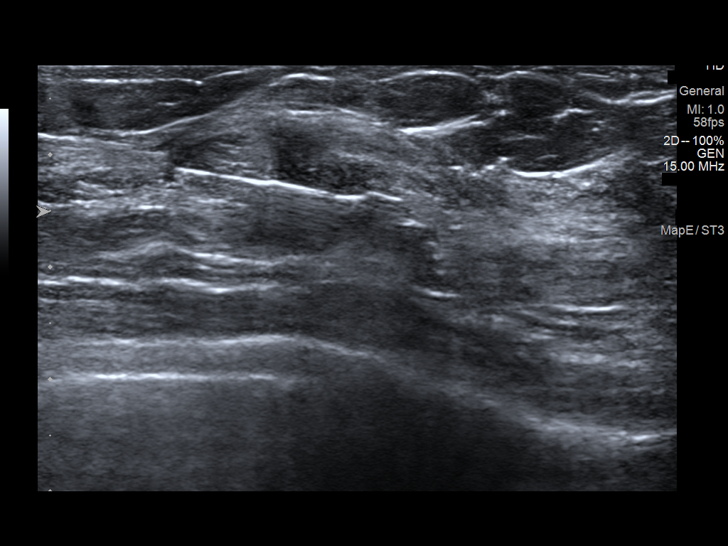
[im 3/14]
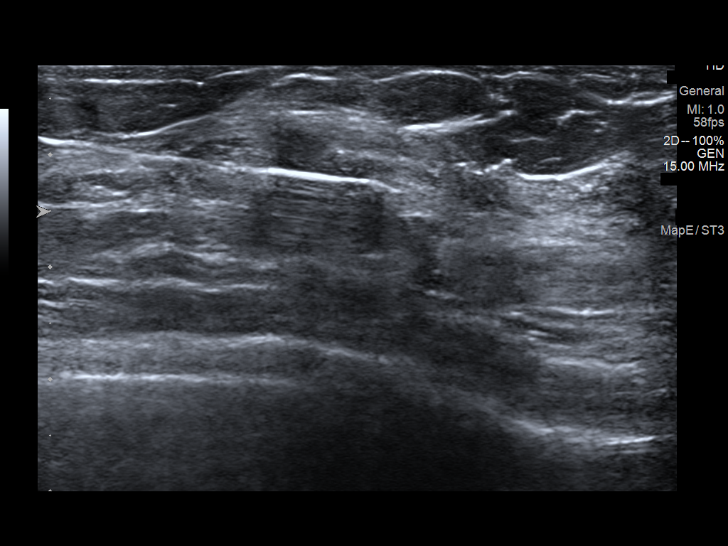
[im 4/14]
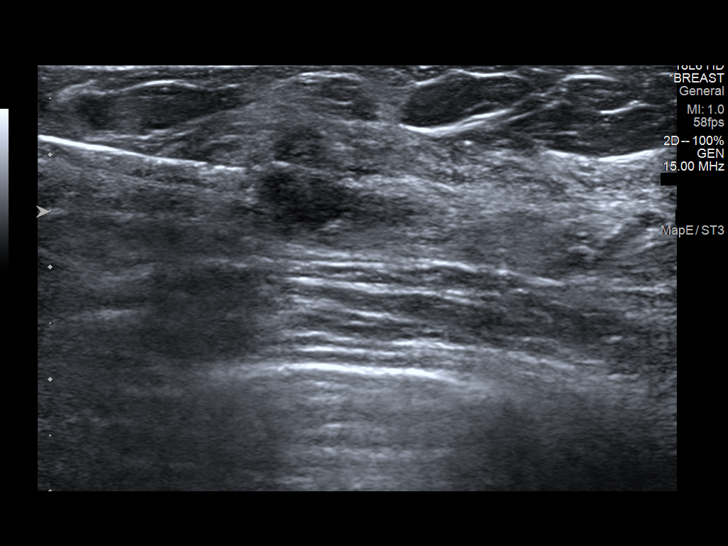
[im 5/14]
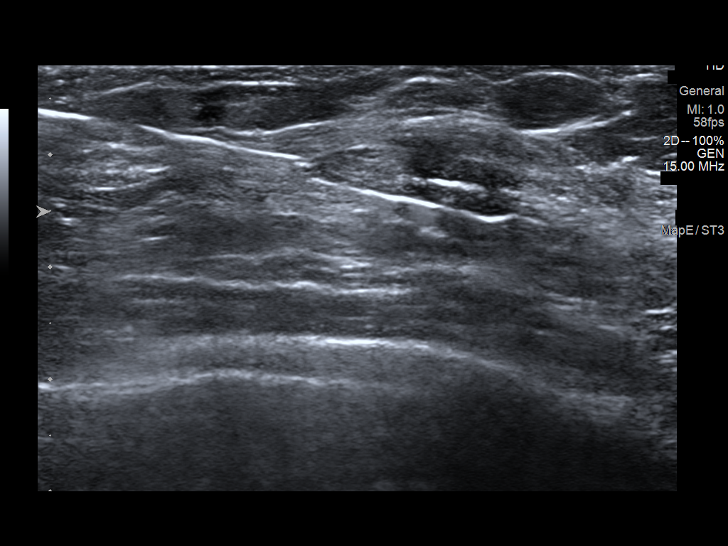
[im 6/14]
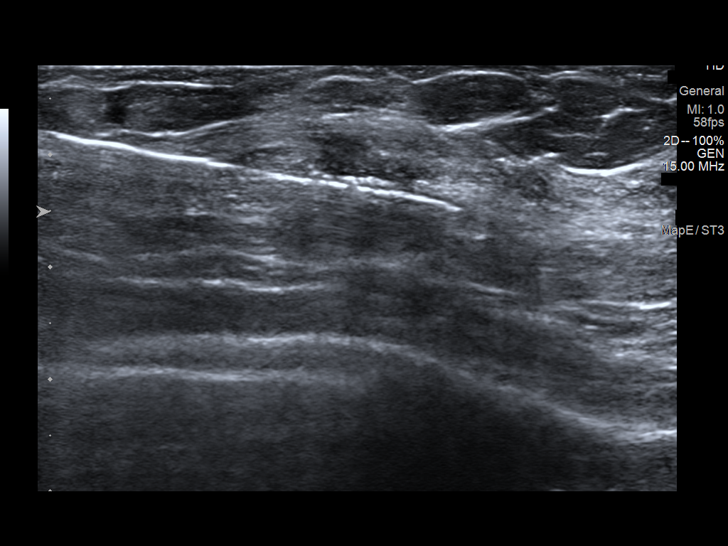
[im 8/14]
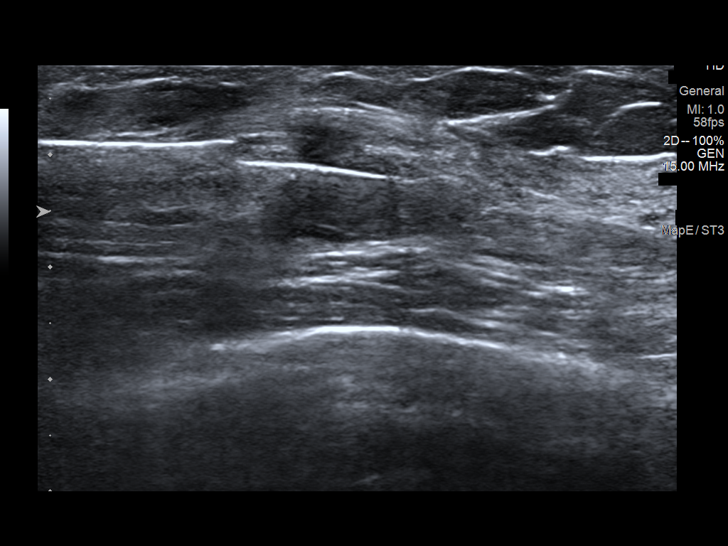
[im 9/14]
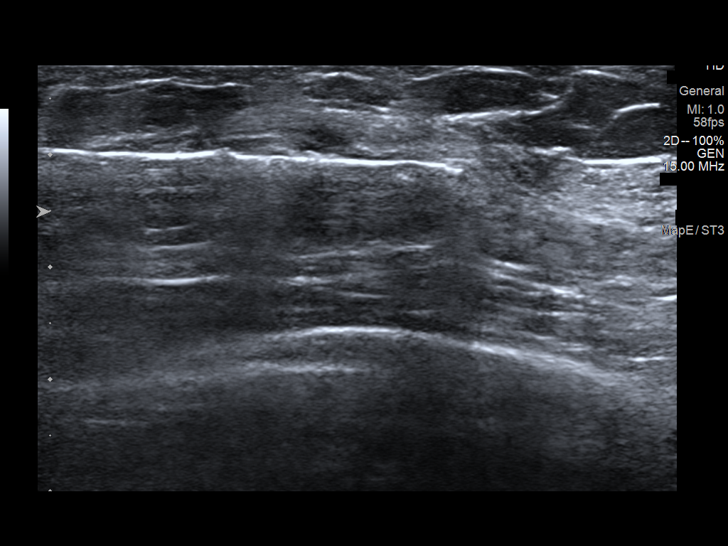
[im 10/14]
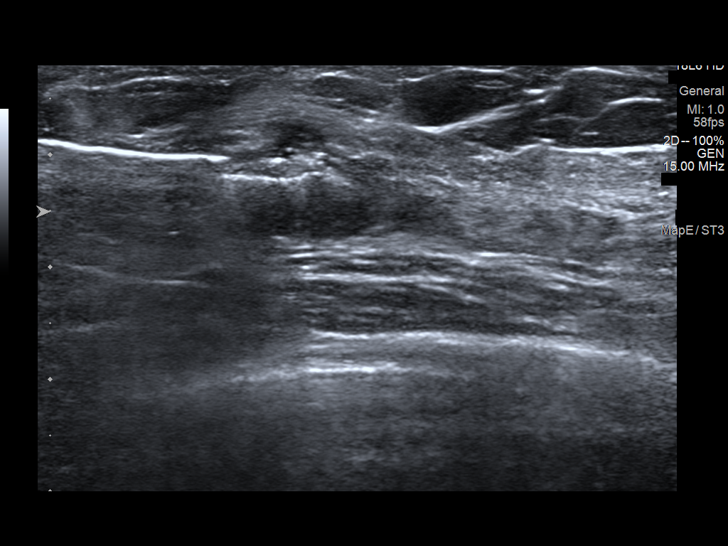
[im 11/14]
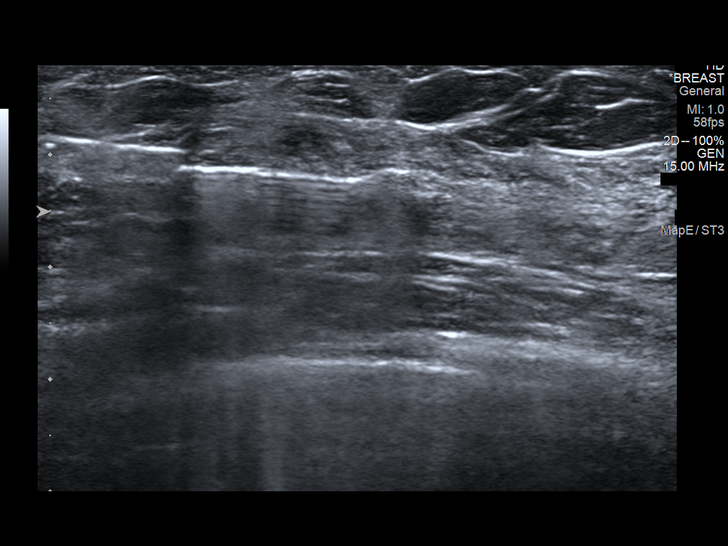
[im 12/14]
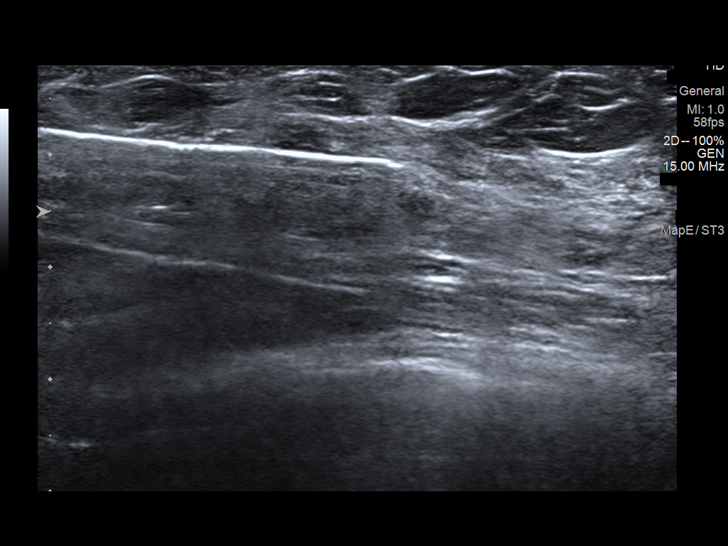
[im 13/14]
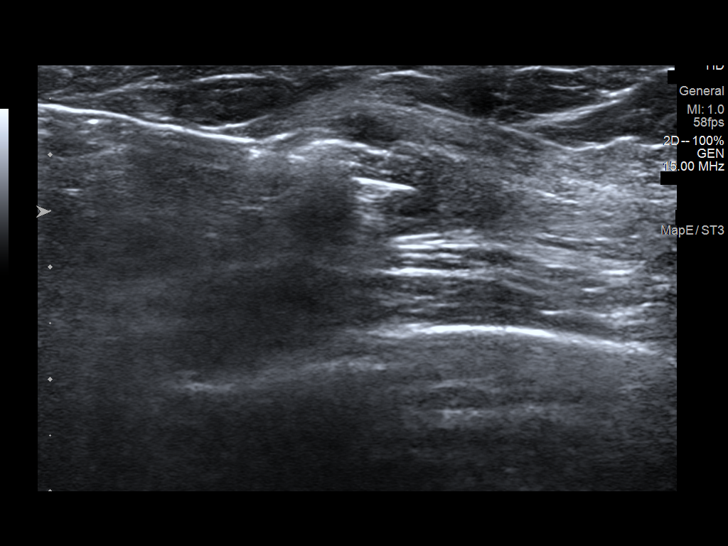
[im 14/14]
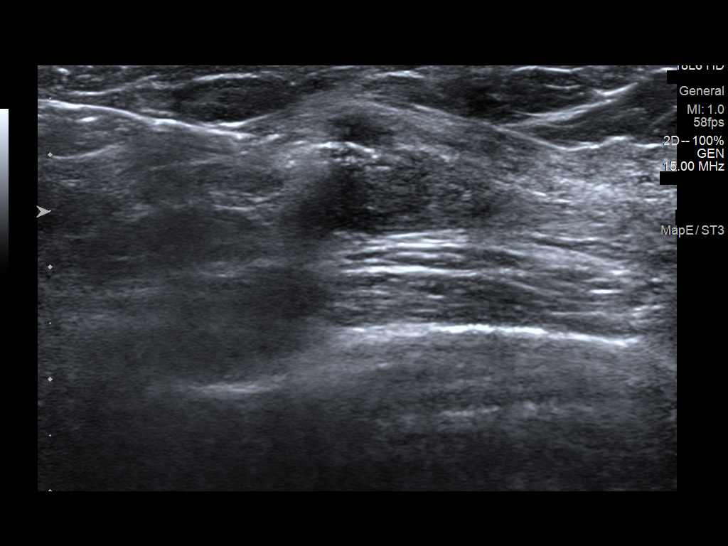

[13 of 14 positions shown; findings below may reference images not displayed]



Using sterile technique and 1% Lidocaine as local anesthetic, under
direct ultrasound visualization, a 14 gauge TSETSO device was
used to perform biopsy of the 2 cm hypoechoic mass at the 10 o'clock
position of the RIGHT breast 6 cm from the nipple using a LATERAL
approach. At the conclusion of the procedure a RIBBON tissue marker
clip was deployed into the biopsy cavity. Follow up 2 view mammogram
was performed and dictated separately.
IMPRESSION: Ultrasound guided biopsy of 2 cm UPPER-OUTER RIGHT breast mass. No
apparent complications.

ADDENDUM:
Pathology revealed GRADE III INVASIVE MAMMARY CARCINOMA of the RIGHT
breast, upper outer, 10 o'clock. This was found to be concordant by
Dr. TSETSO.

Pathology results were discussed with the patient by telephone. The
patient reported doing well after the biopsy with tenderness at the
site. Post biopsy instructions and care were reviewed and questions
were answered. The patient was encouraged to call The [REDACTED]

Surgical consultation has been arranged with Dr. TSETSO at
[REDACTED] on [DATE].

Pathology results reported by TSETSO RN on [DATE].



Using sterile technique and 1% Lidocaine as local anesthetic, under
direct ultrasound visualization, a 14 gauge TSETSO device was
used to perform biopsy of the 2 cm hypoechoic mass at the 10 o'clock
position of the RIGHT breast 6 cm from the nipple using a LATERAL
approach. At the conclusion of the procedure a RIBBON tissue marker
clip was deployed into the biopsy cavity. Follow up 2 view mammogram
was performed and dictated separately.
IMPRESSION: Ultrasound guided biopsy of 2 cm UPPER-OUTER RIGHT breast mass. No
apparent complications.

## 2020-02-02 IMAGING — MG MM BREAST LOCALIZATION CLIP
4 series · 4 of 12 positions shown · non-contrast
Comparison: Previous exam(s).

CLINICAL DATA: Evaluate RIBBON clip placement following
ultrasound-guided RIGHT breast biopsy.

EXAM:
DIAGNOSTIC RIGHT MAMMOGRAM POST ULTRASOUND BIOPSY

[R CC synth-2D]
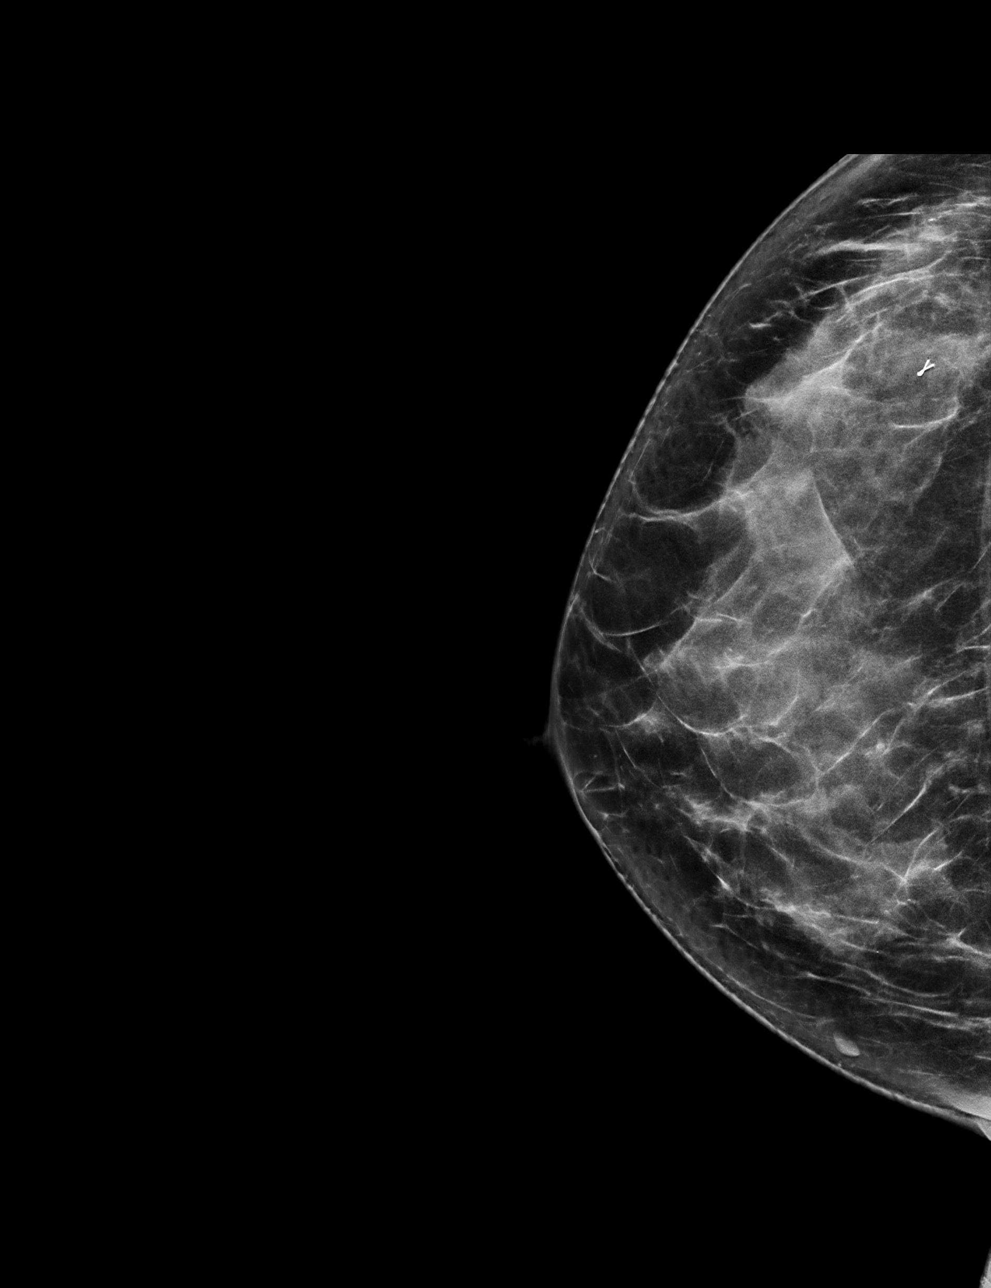

[R ML synth-2D]
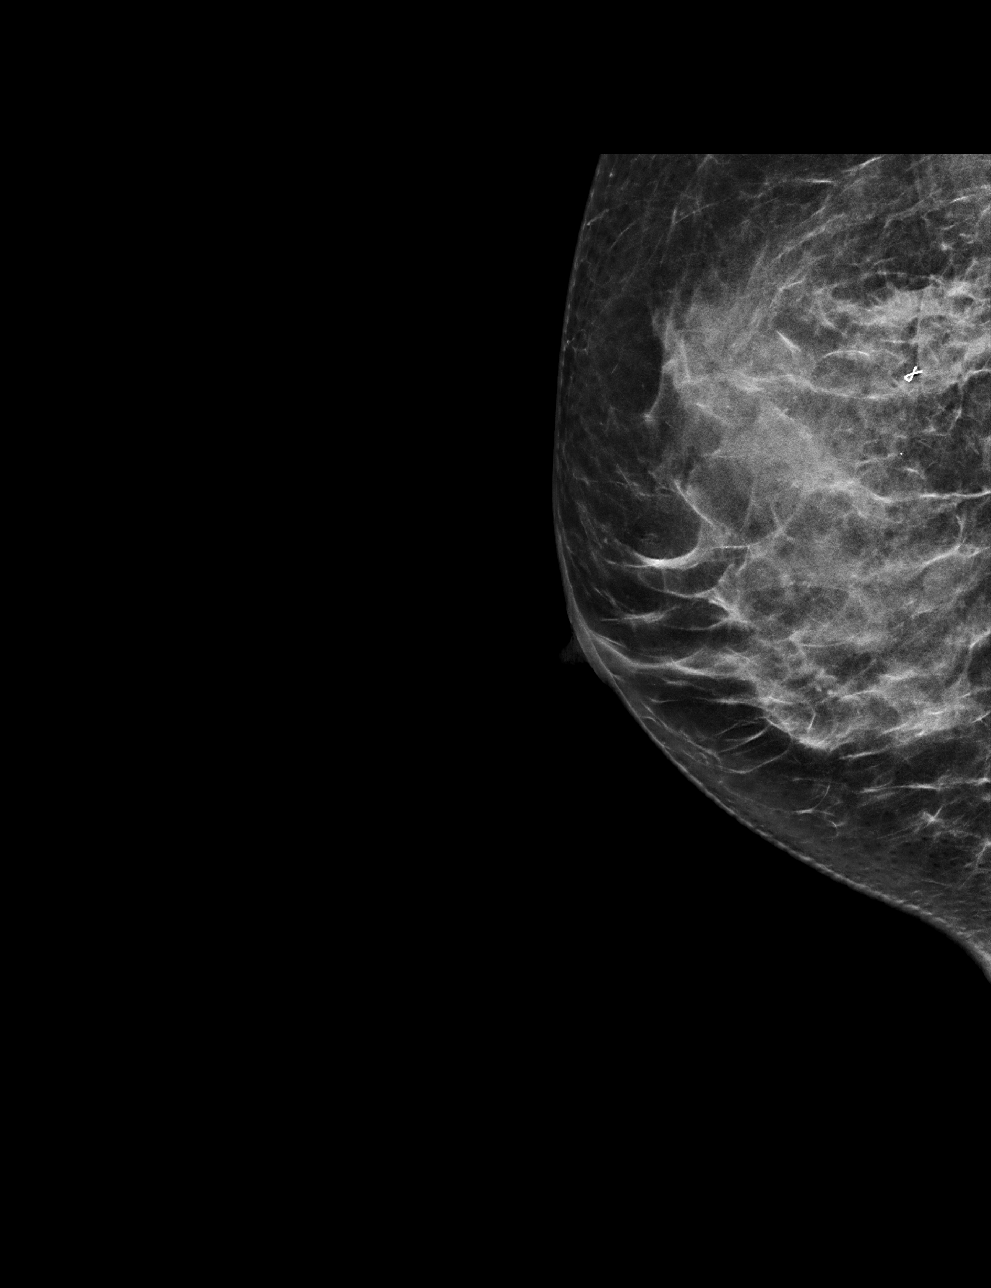

[R CC tomo · tomo slice 33/65.0]
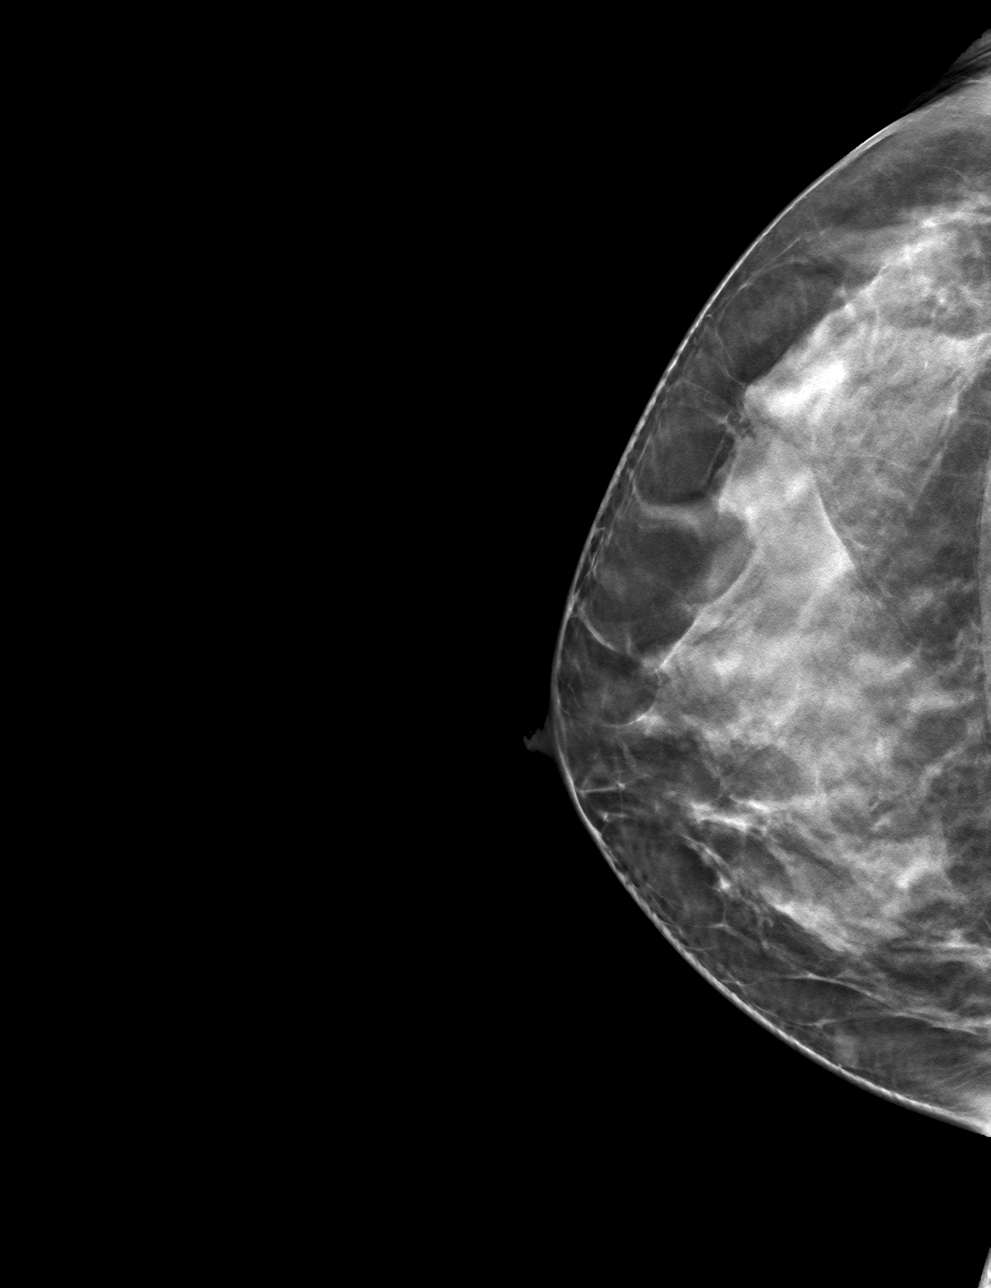

[R ML tomo · tomo slice 31/61.0]
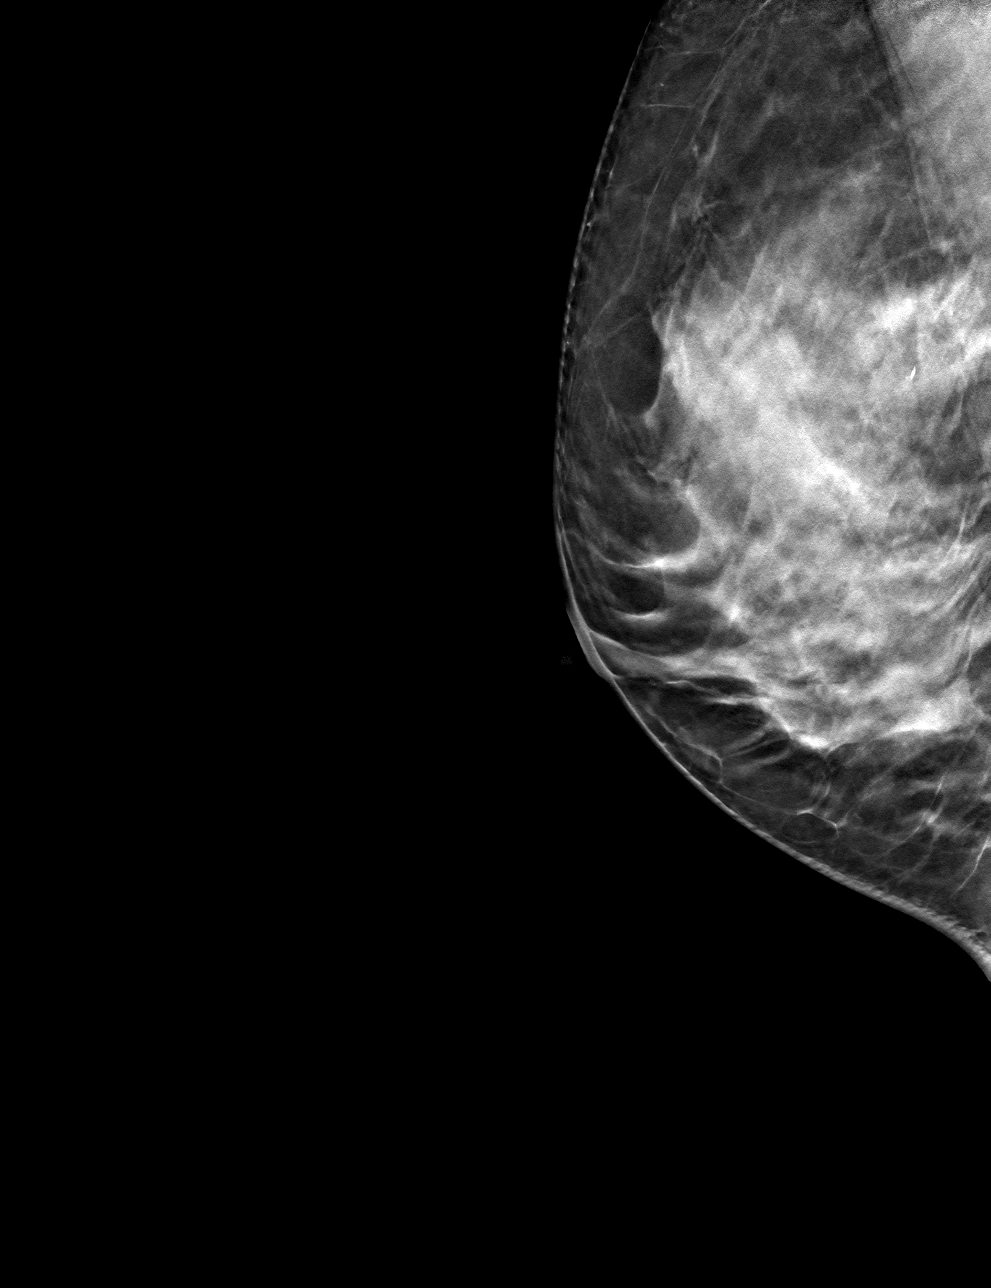

[4 of 12 positions shown; findings below may reference images not displayed]

FINDINGS: Mammographic images were obtained following ultrasound guided biopsy
of the 2 cm mass at the 10 o'clock position of the RIGHT breast. The
RIBBON biopsy marking clip is in expected position at the site of
biopsy.
IMPRESSION: Appropriate positioning of the RIBBON shaped biopsy marking clip at
the site of biopsy in the UPPER-OUTER RIGHT breast.

Final Assessment: Post Procedure Mammograms for Marker Placement

## 2020-02-06 ENCOUNTER — Other Ambulatory Visit: Payer: 59

## 2020-02-07 ENCOUNTER — Telehealth: Payer: Self-pay | Admitting: Hematology and Oncology

## 2020-02-07 NOTE — Progress Notes (Signed)
[place] CONSULT NOTE  Patient Care Team: Bacigalupo, Dionne Bucy, MD as PCP - General (Family Medicine)  CHIEF COMPLAINTS/PURPOSE OF CONSULTATION:  Newly diagnosed breast cancer  HISTORY OF PRESENTING ILLNESS:  Virginia [name] 54 y.o. female is here because of recent diagnosis of invasive mammary carcinoma of the right breast. Patient palpated a right breast lump and noted tenderness. Diagnostic mammogram and [place] on 02/01/20 showed a 2.0cm mass at the 10 o'clock position with no axillary adenopathy. Biopsy on 02/02/20 showed invasive mammary carcinoma, grade 3, HER-2 equivocal by IHC FISH negative, ER/PR negative, Ki67 80%. She presents to the clinic today for initial evaluation and discussion of treatment options.   I reviewed her records extensively and collaborated the history with the patient.  SUMMARY OF ONCOLOGIC HISTORY: Oncology History  Malignant neoplasm of upper-outer quadrant of right breast in female, estrogen receptor negative ([place])  02/02/2020 Initial Diagnosis   Right breast lump tenderness. Diagnostic mammogram and [place] on 02/01/20 showed a 2.0cm mass at the 10 o'clock position with no axillary adenopathy. 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, ER/PR negative, Ki67 80%   02/08/2020 Cancer Staging   Staging form: Breast, AJCC 8th Edition - Clinical stage from 02/08/2020: Stage IB (cT1c, cN0, cM0, G3, ER-, PR-, HER2-) - Signed by Nicholas Lose, MD on 02/08/2020     MEDICAL HISTORY:  Past Medical History:  Diagnosis Date  . Anorexia   . Anxiety   . Arthritis    knees  . Fibroids   . Wears glasses     SURGICAL HISTORY: Past Surgical History:  Procedure Laterality Date  . CLEFT PALATE REPAIR    . CLEFT PALATE REPAIR  1970  . COLONOSCOPY WITH PROPOFOL N/A 04/25/2018   Procedure: COLONOSCOPY WITH PROPOFOL;  Surgeon: [name], MD;  Location: [facility];  Service: Endoscopy;  Laterality: N/A;    SOCIAL  HISTORY: Social History   Socioeconomic History  . Marital status: Married    Spouse name: Luretha Rued  . Number of children: 2  . Years of education: 16  . Highest education level: Bachelor's degree (e.g., BA, AB, BS)  Occupational History  . Occupation: works on [employer]  Tobacco Use  . Smoking status: Never Smoker  . Smokeless tobacco: Never Used  Substance and Sexual Activity  . Alcohol use: No    Alcohol/week: 0.0 standard drinks  . Drug use: No  . Sexual activity: Yes    Partners: Male    Birth control/protection: Surgical    Comment: husband s/p vasectomy  Other Topics Concern  . Not on file  Social History Narrative  . Not on file   Social Determinants of Health   Financial Resource Strain:   . Difficulty of Paying Living Expenses:   Food Insecurity:   . Worried About Charity fundraiser in the Last Year:   . Arboriculturist in the Last Year:   Transportation Needs:   . Film/video editor (Medical):   Marland Kitchen Lack of Transportation (Non-Medical):   Physical Activity:   . Days of Exercise per Week:   . Minutes of Exercise per Session:   Stress:   . Feeling of Stress :   Social Connections:   . Frequency of Communication with Friends and Family:   . Frequency of Social Gatherings with Friends and Family:   . Attends Religious Services:   . Active Member of Clubs or Organizations:   . Attends Club  or Organization Meetings:   Marland Kitchen Marital Status:   Intimate Partner Violence:   . Fear of Current or Ex-Partner:   . Emotionally Abused:   Marland Kitchen Physically Abused:   . Sexually Abused:     FAMILY HISTORY: Family History  Problem Relation Age of Onset  . Anxiety disorder Mother   . Heart disease Father 70       CABG x4  . Diabetes Father   . Hypertension Father   . Hypothyroidism Father   . Anxiety disorder Daughter   . Heart disease Maternal Grandmother   . Stroke Maternal Grandmother 68  . Heart disease Paternal Aunt   . Stroke Paternal Aunt    . Heart disease Paternal Uncle   . Pancreatic cancer Paternal Uncle   . Hypertension Sister   . Hyperlipidemia Sister   . Hypothyroidism Sister   . Lung cancer Maternal Uncle   . Healthy Son   . Colon cancer Neg Hx   . Breast cancer Neg Hx   . Ovarian cancer Neg Hx   . Cervical cancer Neg Hx     ALLERGIES:  has No Known Allergies.  MEDICATIONS:  Current Outpatient Medications  Medication Sig Dispense Refill  . escitalopram (LEXAPRO) 20 MG tablet TAKE 1 TABLET (20 MG TOTAL) BY MOUTH DAILY. 90 tablet 1  . ibuprofen (ADVIL,MOTRIN) 100 MG tablet Take 200 mg by mouth every 6 (six) hours as needed for fever.    . naproxen sodium (ALEVE) 220 MG tablet Take 220 mg by mouth daily.     No current facility-administered medications for this visit.    REVIEW OF SYSTEMS:   Constitutional: Denies fevers, chills or abnormal night sweats Eyes: Denies blurriness of vision, double vision or watery eyes Ears, nose, mouth, throat, and face: Denies mucositis or sore throat Respiratory: Denies cough, dyspnea or wheezes Cardiovascular: Denies palpitation, chest discomfort or lower extremity swelling Gastrointestinal:  Denies nausea, heartburn or change in bowel habits Skin: Denies abnormal skin rashes Lymphatics: Denies new lymphadenopathy or easy bruising Neurological:Denies numbness, tingling or new weaknesses Behavioral/Psych: Severe anxiety related to this diagnosis. Breast: palpable, tender right breast mass All other systems were reviewed with the patient and are negative.  PHYSICAL EXAMINATION: ECOG PERFORMANCE STATUS: 1 - Symptomatic but completely ambulatory  Vitals:   02/08/20 0804  BP: 112/81  Pulse: 88  Resp: 17  Temp: 98.6 F (37 C)  SpO2: 99%   Filed Weights   02/08/20 0804  Weight: 137 lb 11.2 oz (62.5 kg)    GENERAL:alert, no distress and comfortable SKIN: skin color, texture, turgor are normal, no rashes or significant lesions EYES: normal, conjunctiva are pink  and non-injected, sclera clear OROPHARYNX:no exudate, no erythema and lips, buccal mucosa, and tongue normal  NECK: supple, thyroid normal size, non-tender, without nodularity LYMPH:  no palpable lymphadenopathy in the cervical, axillary or inguinal LUNGS: clear to auscultation and percussion with normal breathing effort HEART: regular rate & rhythm and no murmurs and no lower extremity edema ABDOMEN:abdomen soft, non-tender and normal bowel sounds Musculoskeletal:no cyanosis of digits and no clubbing  PSYCH: Extremely anxious and stressed out NEURO: no focal motor/sensory deficits    LABORATORY DATA:  I have reviewed the data as listed Lab Results  Component Value Date   WBC 5.9 04/20/2019   HGB 13.0 04/20/2019   HCT 39.0 04/20/2019   MCV 89 04/20/2019   PLT 178 04/20/2019   Lab Results  Component Value Date   NA 144 04/20/2019   K 4.8  04/20/2019   CL 108 (H) 04/20/2019   CO2 20 04/20/2019    RADIOGRAPHIC STUDIES: I have personally reviewed the radiological reports and agreed with the findings in the report.  ASSESSMENT AND PLAN:  Malignant neoplasm of upper-outer quadrant of right breast in female, estrogen receptor negative ([place]) 02/02/2020:Right breast lump tenderness. Diagnostic mammogram and [place] on 02/01/20 showed a 2.0cm mass at the 10 o'clock position with no axillary adenopathy. 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 FISH negative, ER/PR negative, Ki67 80% T1CN0 stage Ib Pathology and radiology counseling: Discussed with the patient, the details of pathology including the type of breast cancer,the clinical staging, the significance of ER, PR and HER-2/neu receptors and the implications for treatment. After reviewing the pathology in detail, we proceeded to discuss the different treatment options between surgery, radiation, chemotherapy, antiestrogen therapies.  Recommendation: 1.   neoadjuvant chemotherapy   2.  Adjuvant chemotherapy if not  given neoadjuvantly  3.  Adjuvant radiation therapy ------------------------------------------------------------------------------------------------------------------------------------------------------- Chemotherapy Counseling: I discussed the risks and benefits of chemotherapy including the risks of nausea/ vomiting, risk of infection from low WBC count, fatigue due to chemo or anemia, bruising or bleeding due to low platelets, mouth sores, loss/ change in taste and decreased appetite. Liver and kidney function will be monitored through out chemotherapy as abnormalities in liver and kidney function may be a side effect of treatment. Cardiac dysfunction due to Adriamycin and neuropathy risk from Taxol were discussed in detail. Risk of permanent bone marrow dysfunction and leukemia due to chemo were also discussed.   Plan: 1. Port placement  2. Echocardiogram 3. Chemotherapy class 4. Breast MRI 5. CT chest abdomen pelvis and bone scan for staging Genetic counseling     Return to clinic in 1-2 weeks to start chemotherapy. Patient has appointments to see surgery on Monday.  All questions were answered. The patient knows to call the clinic with any problems, questions or concerns.   Rulon Eisenmenger, MD, MPH 02/08/2020    I, Molly Dorshimer, am acting as scribe for Nicholas Lose, MD.  I have reviewed the above documentation for accuracy and completeness, and I agree with the above.

## 2020-02-07 NOTE — Telephone Encounter (Signed)
Received a msg for a dx of breast cancer. Virginia [name] has been cld and scheduled to see Dr. Lindi Adie on 3/25 at 8am. Pt aware to arrive 15 minutes early.

## 2020-02-08 ENCOUNTER — Inpatient Hospital Stay: Payer: 59 | Attending: Hematology and Oncology | Admitting: Hematology and Oncology

## 2020-02-08 ENCOUNTER — Other Ambulatory Visit: Payer: Self-pay | Admitting: *Deleted

## 2020-02-08 ENCOUNTER — Telehealth: Payer: Self-pay | Admitting: *Deleted

## 2020-02-08 ENCOUNTER — Inpatient Hospital Stay: Payer: 59 | Admitting: Licensed Clinical Social Worker

## 2020-02-08 ENCOUNTER — Encounter: Payer: Self-pay | Admitting: *Deleted

## 2020-02-08 ENCOUNTER — Telehealth: Payer: Self-pay | Admitting: Hematology and Oncology

## 2020-02-08 ENCOUNTER — Telehealth: Payer: Self-pay

## 2020-02-08 ENCOUNTER — Other Ambulatory Visit: Payer: Self-pay

## 2020-02-08 VITALS — BP 112/81 | HR 88 | Temp 98.6°F | Resp 17 | Ht 61.0 in | Wt 137.7 lb

## 2020-02-08 DIAGNOSIS — Z823 Family history of stroke: Secondary | ICD-10-CM | POA: Insufficient documentation

## 2020-02-08 DIAGNOSIS — Z8249 Family history of ischemic heart disease and other diseases of the circulatory system: Secondary | ICD-10-CM | POA: Diagnosis not present

## 2020-02-08 DIAGNOSIS — Z8349 Family history of other endocrine, nutritional and metabolic diseases: Secondary | ICD-10-CM | POA: Diagnosis not present

## 2020-02-08 DIAGNOSIS — C50411 Malignant neoplasm of upper-outer quadrant of right female breast: Secondary | ICD-10-CM

## 2020-02-08 DIAGNOSIS — Z79899 Other long term (current) drug therapy: Secondary | ICD-10-CM | POA: Insufficient documentation

## 2020-02-08 DIAGNOSIS — Z801 Family history of malignant neoplasm of trachea, bronchus and lung: Secondary | ICD-10-CM | POA: Diagnosis not present

## 2020-02-08 DIAGNOSIS — Z171 Estrogen receptor negative status [ER-]: Secondary | ICD-10-CM | POA: Diagnosis not present

## 2020-02-08 DIAGNOSIS — Z833 Family history of diabetes mellitus: Secondary | ICD-10-CM | POA: Diagnosis not present

## 2020-02-08 DIAGNOSIS — Z800 Family history of malignant neoplasm of digestive organs: Secondary | ICD-10-CM | POA: Diagnosis not present

## 2020-02-08 DIAGNOSIS — Z818 Family history of other mental and behavioral disorders: Secondary | ICD-10-CM | POA: Diagnosis not present

## 2020-02-08 DIAGNOSIS — F419 Anxiety disorder, unspecified: Secondary | ICD-10-CM | POA: Diagnosis not present

## 2020-02-08 MED ORDER — LORAZEPAM 0.5 MG PO TABS: 0.5000 mg | ORAL_TABLET | Freq: Every evening | ORAL | 0 refills | Status: DC | PRN

## 2020-02-08 MED ORDER — PROCHLORPERAZINE MALEATE 10 MG PO TABS: 10.0000 mg | ORAL_TABLET | Freq: Four times a day (QID) | ORAL | 1 refills | Status: DC | PRN

## 2020-02-08 MED ORDER — ONDANSETRON HCL 8 MG PO TABS: 8.0000 mg | ORAL_TABLET | Freq: Two times a day (BID) | ORAL | 1 refills | Status: DC | PRN

## 2020-02-08 MED ORDER — DEXAMETHASONE 4 MG PO TABS: ORAL_TABLET | ORAL | 0 refills | Status: DC

## 2020-02-08 MED ORDER — LIDOCAINE-PRILOCAINE 2.5-2.5 % EX CREA: TOPICAL_CREAM | CUTANEOUS | 3 refills | Status: DC

## 2020-02-08 MED FILL — ONDANSETRON HCL 8 MG TABLET: 8 | 15 days supply | Qty: 30 | Fill #0

## 2020-02-08 MED FILL — LORazepam 0.5 MG TABS: 0.5 | 30 days supply | Qty: 30 | Fill #0

## 2020-02-08 MED FILL — PROCHLORPERAZINE 10 MG TAB: 10 | 7 days supply | Qty: 30 | Fill #0

## 2020-02-08 MED FILL — DEXAMETHASONE 4 MG TABLET: 4 | 4 days supply | Qty: 8 | Fill #0

## 2020-02-08 MED FILL — LIDOCAINE-PRILOCAINE CREAM: 2.5-2.5 | 15 days supply | Qty: 30 | Fill #0

## 2020-02-08 NOTE — Telephone Encounter (Signed)
Previous was Wrong note

## 2020-02-08 NOTE — Telephone Encounter (Signed)
Attempt x 1 to contact pt to go over upcoming appointment schedule.  No answer and unable to LVM due to VM box being full.

## 2020-02-08 NOTE — Progress Notes (Signed)
START ON PATHWAY REGIMEN - Breast     A cycle is every 14 days (cycles 1-4):     Doxorubicin      Cyclophosphamide      Pegfilgrastim-xxxx    A cycle is every 21 days (cycles 5-8):     Paclitaxel      Carboplatin   **Always confirm dose/schedule in your pharmacy ordering system**  Patient Characteristics: Preoperative or Nonsurgical Candidate (Clinical Staging), Neoadjuvant Therapy followed by Surgery, Invasive Disease, Chemotherapy, HER2 Negative/Unknown/Equivocal, ER Negative/Unknown, Platinum Therapy Indicated Therapeutic Status: Preoperative or Nonsurgical Candidate (Clinical Staging) AJCC M Category: cM0 AJCC Grade: G3 Breast Surgical Plan: Neoadjuvant Therapy followed by Surgery ER Status: Negative (-) AJCC 8 Stage Grouping: IB HER2 Status: Negative (-) AJCC T Category: cT1c AJCC N Category: cN0 PR Status: Negative (-) Type of Therapy: Platinum Therapy Indicated Intent of Therapy: Curative Intent, Discussed with Patient 

## 2020-02-08 NOTE — Assessment & Plan Note (Addendum)
02/02/2020:Right breast lump tenderness. Diagnostic mammogram and [place] on 02/01/20 showed a 2.0cm mass at the 10 o'clock position with no axillary adenopathy. 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, ER/PR negative, Ki67 80% T1CN0 stage Ib Pathology and radiology counseling: Discussed with the patient, the details of pathology including the type of breast cancer,the clinical staging, the significance of ER, PR and HER-2/neu receptors and the implications for treatment. After reviewing the pathology in detail, we proceeded to discuss the different treatment options between surgery, radiation, chemotherapy, antiestrogen therapies.  Recommendation: 1.  Breast conserving surgery versus neoadjuvant chemotherapy (HER-2 testing by [facility] is pending and tumor board discussion is also pending) 2.  Adjuvant chemotherapy if not given neoadjuvantly  3.  Adjuvant radiation therapy ------------------------------------------------------------------------------------------------------------------------------------------------------- Chemotherapy Counseling: I discussed the risks and benefits of chemotherapy including the risks of nausea/ vomiting, risk of infection from low WBC count, fatigue due to chemo or anemia, bruising or bleeding due to low platelets, mouth sores, loss/ change in taste and decreased appetite. Liver and kidney function will be monitored through out chemotherapy as abnormalities in liver and kidney function may be a side effect of treatment. Cardiac dysfunction due to Adriamycin and neuropathy risk from Taxol were discussed in detail. Risk of permanent bone marrow dysfunction and leukemia due to chemo were also discussed.   Plan: 1. Port placement  2. Echocardiogram 3. Chemotherapy class 4. Breast MRI 5. CT chest abdomen pelvis and bone scan for staging Genetic counseling will also be arranged  Return to clinic in 1 week to start chemotherapy.

## 2020-02-08 NOTE — Telephone Encounter (Deleted)
Per Mendel Ryder NP called [facility] [phone] asked them to fax over patients Bone density scan to [phone]. Waiting for fax to come through.

## 2020-02-08 NOTE — Telephone Encounter (Signed)
Scheduled patient edu per sch msg. Called and spoke with patient. Confirmed appt

## 2020-02-08 NOTE — Progress Notes (Signed)
Virginia [name]  Clinical Social [name] was referred by Dr. Lindi Adie for psychosocial support.  Clinical Social Worker met with patient in exam room  to offer support and assess for needs.  Patient reports history of anxiety which is typically well-managed on medication but is increasing due to diagnosis. She has only told a few people, mainly family, and is trying to figure out how to cope. Feels like she is getting day-to-day tasks done, but time is moving slow around her. She is also figuring out what to do with FMLA and accommodations through [name] (works at [employer] and [employer]).  [name] finds peace and enjoyment in spending time with her family (husband, daughter- age 52 in [place], son- age 39 senior at [employer]), gardening, birds, walking, and praying. CSW encouraged engagement in these activities and discussed other options for coping including worry time and breathing exercises to manage anxiety.   Normalized feelings of anxiety around diagnosis and discussed common feelings and emotions when being diagnosed with cancer, and the importance of support during treatment.  CSW informed patient of the support team and support services at [facility].  Patient is interested in an Alight guide- referral made. CSW will check-in at future appointments and encouraged patient to reach out to this CSW or chaplain Lorrin Jackson for further emotional support between medical appointments.     Celeste Candelas, [place], [place] Worker [facility]

## 2020-02-09 ENCOUNTER — Encounter: Payer: Self-pay | Admitting: *Deleted

## 2020-02-09 ENCOUNTER — Telehealth: Payer: Self-pay | Admitting: Hematology and Oncology

## 2020-02-09 NOTE — Telephone Encounter (Signed)
Scheduled labs to pt's previously scheduled genetics appt per 3/26 sch msg

## 2020-02-12 ENCOUNTER — Ambulatory Visit: Payer: Self-pay | Admitting: Surgery

## 2020-02-12 ENCOUNTER — Telehealth: Payer: Self-pay | Admitting: Licensed Clinical Social Worker

## 2020-02-12 ENCOUNTER — Other Ambulatory Visit (HOSPITAL_COMMUNITY): Payer: 59

## 2020-02-12 ENCOUNTER — Other Ambulatory Visit: Payer: Self-pay

## 2020-02-12 ENCOUNTER — Telehealth: Payer: Self-pay | Admitting: *Deleted

## 2020-02-12 ENCOUNTER — Inpatient Hospital Stay: Payer: 59

## 2020-02-12 ENCOUNTER — Encounter: Payer: Self-pay | Admitting: *Deleted

## 2020-02-12 DIAGNOSIS — C50911 Malignant neoplasm of unspecified site of right female breast: Secondary | ICD-10-CM | POA: Diagnosis not present

## 2020-02-12 NOTE — Telephone Encounter (Signed)
-----   Message from Mauro Kaufmann, RN sent at 02/12/2020  2:14 PM EDT ----- Regarding: RE: Virginia [name] placement [facility], I placed scheduling orders for her to start chemo on 4/9 (next Friday) after her port is placed on 4/8. Dr. Brantley Stage will keep her port accessed. She has UMR, I think they prefer neulasta. If that is the case, she can absolutely do on-pro  Dawn ----- Message ----- From: Jesse Fall, RN Sent: 02/12/2020   2:08 PM EDT To: Mauro Kaufmann, RN, Nicholas Lose, MD, [facility] Subject: Virginia [name] placement                         Pt teaching done today.  Pt has port scheduled for 02/22/19 with surgeon.  She would like to start treatment next week & is OK to do peripherally if staff is OK.  IF not, would you want to have IR place port?  She doesn't have treatment scheduled yet.  She also is interested in Ono-PRo if insurance will cover since she lives a distance away.  Thanks, [employer]

## 2020-02-12 NOTE — Telephone Encounter (Signed)
Called pt & informed of OK to have port placed 4/8 & per [facility] will have treatment next day after port placement on 4/9 & she sent scheduling info.   Message to pharmacy to see if she can have OnPro.

## 2020-02-12 NOTE — H&P (View-Only) (Signed)
Virginia [name] Documented: 02/12/2020 9:37 AM Location: [name] Patient #: [id number] DOB: 1965-12-31 Married / Language: Virginia [name] / Race: White Female  History of Present Illness Virginia [name] A. Virginia Frogge [name]; 02/12/2020 10:20 AM) Patient words: 54 year old female sent at the request of the breast parenchyma to the right breast lump was noted last month. This was noted by palpation shower. She underwent some workup both mammography and ultrasound core biopsy. She shunted with a 2 cm right breast mass. 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 She has seen medical oncology and the plan is to initiate neoadjuvant chemotherapy. She has also undergone genetic counseling. She complains of soreness at the biopsy site.              54 year old female presenting for evaluation of a palpable lump in the upper-outer quadrant of the right breast. The lump was previously tender, but has improved.  EXAM: DIGITAL DIAGNOSTIC RIGHT MAMMOGRAM WITH CAD AND TOMO  ULTRASOUND RIGHT BREAST  COMPARISON: Previous exam(s).  ACR Breast Density Category d: The breast tissue is extremely dense, which lowers the sensitivity of mammography.  FINDINGS: A BB has been placed on the upper-outer quadrant of the right breast indicating the palpable site of concern. No suspicious findings are seen deep to the marker. However, due to the extreme density of the surrounding breast tissue, ultrasound will be performed for further evaluation.  Mammographic images were processed with CAD.  On physical exam, there is a firm palpable mass in the upper-outer quadrant of the right breast at the site of concern.  Targeted ultrasound is performed, showing an irregular hypoechoic mass with indistinct margins measuring 2.0 x 1.1 x 1.7 cm at 10 o'clock, 6 cm from the nipple. Blood flow seen within the mass on color Doppler imaging. Ultrasound of the right axilla demonstrates multiple  normal-appearing lymph nodes.  IMPRESSION: 1. There is a highly suspicious 2.0 cm mass at the palpable site in the right breast at 10 o'clock.  2. No evidence of right axillary lymphadenopathy.  RECOMMENDATION: Ultrasound guided biopsy is recommended for the right breast mass, and has been scheduled for 02/02/2020 at 2:45 p.m.  I have discussed the findings and recommendations with the patient. If applicable, a reminder letter will be sent to the patient regarding the next appointment.  BI-RADS CATEGORY 5: Highly suggestive of malignancy.   Electronically Signed By: Virginia [name] M.D. On: 02/01/2020 09:18          ADDITIONAL INFORMATION: FLUORESCENCE IN-SITU HYBRIDIZATION Results: GROUP 5: HER2 **NEGATIVE** Equivocal form of amplification of the HER2 gene was detected in the IHC 2+ tissue sample received from this individual. HER2 FISH was performed by a technologist and cell imaging and analysis on the BioView. RATIO OF HER2/CEN17 SIGNALS 1.14 AVERAGE HER2 COPY NUMBER PER CELL 2.00 The ratio of HER2/CEN 17 is within the range < 2.0 of HER2/CEN 17 and a copy number of HER2 signals per cell is <4.0. Arch Pathol Lab Med 1:1,2018 Virginia Sheller [name] Pathologist, Electronic Signature ( Signed 02/08/2020) PROGNOSTIC INDICATORS Results: IMMUNOHISTOCHEMICAL AND MORPHOMETRIC ANALYSIS PERFORMED MANUALLY The tumor cells are EQUIVOCAL for Her2 (2+). Her2 by FISH will be performed and results reported separately. Estrogen Receptor: 0%, NEGATIVE Progesterone Receptor: 0%, NEGATIVE Proliferation Marker Ki67: 80% COMMENT: The negative hormone receptor study(ies) in this case has 20 internal positive control. 1 of 3 Amended copy Addendum FINAL for Virginia [name] ([id number]) ADDITIONAL INFORMATION:(continued) REFERENCE RANGE ESTROGEN RECEPTOR NEGATIVE 0% POSITIVE =>1% REFERENCE RANGE PROGESTERONE RECEPTOR NEGATIVE 0% POSITIVE =>1%  All controls stained  appropriately Virginia Sheller [name] Pathologist, Electronic Signature ( Signed 02/06/2020) FINAL DIAGNOSIS Diagnosis Breast, right, needle core biopsy, upper outer, 10 o'clock - INVASIVE MAMMARY CARCINOMA. Microscopic Comment The carcinoma appears nuclear grade 3. The greatest linear extent of tumor in any one core is 9 mm. E-cadherin will be reported separately. Ancillary studies will be reported separately. Results reported to [facility] on 02/05/2020. Intradepartmental consultation (Virginia [name]). ADDENDUM: Immunohistochemistry for E-cadherin is positive supporting a ductal phenotype. Virginia Manners [name] Pathologist, Electronic Signature (Case signed 02/05/2020).  The patient is a 54 year old female.   Past Surgical History (Virginia [name], [name]; 02/12/2020 9:37 AM) Breast Biopsy Right. Oral [name]  Diagnostic Studies History (Virginia [name], [place]; 02/12/2020 9:37 AM) Mammogram within last year Pap Smear 1-5 years ago  Allergies (Virginia [name], [name]; 02/12/2020 9:38 AM) No Known Allergies [02/12/2020]: No Known Drug Allergies [02/12/2020]: Allergies Reconciled  Medication History (Virginia [name], [name]; 02/12/2020 9:38 AM) Escitalopram Oxalate ('20MG'$  Tablet, Oral) Active. Medications Reconciled  Social History Virginia [name], [name]; 02/12/2020 9:37 AM) Alcohol use Occasional alcohol use. Caffeine use Coffee. No drug use Tobacco use Never smoker.  Family History Virginia [name], Virginia [name]; 02/12/2020 9:37 AM) Arthritis Mother. Diabetes Mellitus Father. Heart Disease Father. Heart disease in female family member before age 64 Hypertension Father, Sister. Thyroid problems Father, Sister.  Pregnancy / Birth History Virginia [name], [name]; 02/12/2020 9:37 AM) Age at menarche 7 years. Contraceptive History Contraceptive implant, Oral contraceptives. Gravida 3 Irregular periods Length (months) of breastfeeding 7-12 Maternal age 21-30 Para 2  Other Problems  ([name], [name]; 02/12/2020 9:37 AM) Anxiety Disorder Breast Cancer Lump In Breast     Review of Systems (Virginia [name] Virginia [name]; 02/12/2020 10:23 AM) General Not Present- Appetite Loss, Chills, Fatigue, Fever, Night Sweats, Weight Gain and Weight Loss. Skin Not Present- Change in Wart/Mole, Dryness, Hives, Jaundice, New Lesions, Non-Healing Wounds, Rash and Ulcer. HEENT Not Present- Earache, Hearing Loss, Hoarseness, Nose Bleed, Oral Ulcers, Ringing in the Ears, Seasonal Allergies, Sinus Pain, Sore Throat, Visual Disturbances, Wears glasses/contact lenses and Yellow Eyes. Respiratory Not Present- Bloody sputum, Chronic Cough, Difficulty Breathing, Snoring and Wheezing. Breast Present- Breast Mass. Not Present- Breast Pain, Nipple Discharge and Skin Changes. Cardiovascular Not Present- Chest Pain, Difficulty Breathing Lying Down, Leg Cramps, Palpitations, Rapid Heart Rate, Shortness of Breath and Swelling of Extremities. Gastrointestinal Not Present- Abdominal Pain, Bloating, Bloody Stool, Change in Bowel Habits, Chronic diarrhea, Constipation, Difficulty Swallowing, Excessive gas, Gets full quickly at meals, Hemorrhoids, Indigestion, Nausea, Rectal Pain and Vomiting. Female Genitourinary Not Present- Frequency, Nocturia, Painful Urination, Pelvic Pain and Urgency. Musculoskeletal Present- Joint Pain. Not Present- Back Pain, Joint Stiffness, Muscle Pain, Muscle Weakness and Swelling of Extremities. Neurological Not Present- Decreased Memory, Fainting, Headaches, Numbness, Seizures, Tingling, Tremor, Trouble walking and Weakness. Psychiatric Present- Anxiety. Not Present- Bipolar, Change in Sleep Pattern, Depression, Fearful and Frequent crying. Endocrine Present- Hot flashes. Not Present- Cold Intolerance, Excessive Hunger, Hair Changes, Heat Intolerance and New Diabetes. Hematology Not Present- Blood Thinners, Easy Bruising, Excessive bleeding, Gland problems, HIV and Persistent  Infections. All other systems negative  Vitals (Virginia Nolan [name]; 02/12/2020 9:38 AM) 02/12/2020 9:38 AM Weight: 139.38 lb Height: 61in Body Surface Area: 1.62 m Body Mass Index: 26.33 kg/m  Temp.: 98.2F  Pulse: 98 (Regular)  BP: 114/68 (Sitting, Left Arm, Standard)        Physical Exam (Camarion Weier A. Eletha Culbertson [name]; 02/12/2020 10:22 AM)  General Mental Status-Alert. General Appearance-Consistent with stated age. Hydration-Well  hydrated. Voice-Normal.  Head and Neck Head-normocephalic, atraumatic with no lesions or palpable masses. Trachea-midline. Thyroid Gland Characteristics - normal size and consistency.  Chest and Lung Exam Note: Work of breathing normal. No audible wheezing  Breast Note: Bruising right breast upper outer quadrant. Hematoma noted which is small. No other masses in either breast noted.  Cardiovascular Note: NSR noJVD  Neurologic Neurologic evaluation reveals -alert and oriented x 3 with no impairment of recent or remote memory. Mental Status-Normal.  Neuropsychiatric Examination of related systems reveals-The patient is well-nourished and well-groomed. Orientation-oriented X3.  Musculoskeletal Normal Exam - Left-Upper Extremity Strength Normal and Lower Extremity Strength Normal. Normal Exam - Right-Upper Extremity Strength Normal and Lower Extremity Strength Normal.  Lymphatic Head & Neck  General Head & Neck Lymphatics: Bilateral - Description - Normal. Axillary  General Axillary Region: Bilateral - Description - Normal. Tenderness - Non Tender.    Assessment & Plan (Gerald Kuehl A. Reno Clasby [name]; 02/12/2020 10:23 AM)  BREAST CANCER, RIGHT (C50.911) Impression: T2 N0 MX grade 3 IDC triple negative  Discuss port placement today for neoadjuvant chemotherapy. Definitive [name] after chemotherapy completes Magnetic resonance imaging/genetics  Pt requires port placement for chemotherapy. Risk include  bleeding, infection, pneumothorax, hemothorax, mediastinal injury, nerve injury , blood vessel injury, strke, blood clots, death, migration. embolization and need for additional procedures. Pt agrees to proceed.  Current Plans Pt Education - [facility] Portacath HCI Use of a central venous catheter for intravenous therapy was discussed. Technique of catheter placement using ultrasound and fluoroscopy guidance was discussed. Risks such as bleeding, infection, pneumothorax, catheter occlusion, reoperation, and other risks were discussed. I noted a good likelihood this will help address the problem. Questions were answered. The patient expressed understanding & wishes to proceed.

## 2020-02-12 NOTE — H&P (Signed)
Virginia [name] Documented: 02/12/2020 9:37 AM Location: [facility] Patient #: [id number] DOB: 11/10/1966 Married / Language: [name] / Race: White Female  History of Present Illness [name] A. Wyatte Dames [name]; 02/12/2020 10:20 AM) Patient words: 54 year old female sent at the request of the breast parenchyma to the right breast lump was noted last month. This was noted by palpation shower. She underwent some workup both mammography and ultrasound core biopsy. She shunted with a 2 cm right breast mass. 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 She has seen medical oncology and the plan is to initiate neoadjuvant chemotherapy. She has also undergone genetic counseling. She complains of soreness at the biopsy site.              54 year old female presenting for evaluation of a palpable lump in the upper-outer quadrant of the right breast. The lump was previously tender, but has improved.  EXAM: DIGITAL DIAGNOSTIC RIGHT MAMMOGRAM WITH CAD AND TOMO  ULTRASOUND RIGHT BREAST  COMPARISON: Previous exam(s).  ACR Breast Density Category d: The breast tissue is extremely dense, which lowers the sensitivity of mammography.  FINDINGS: A BB has been placed on the upper-outer quadrant of the right breast indicating the palpable site of concern. No suspicious findings are seen deep to the marker. However, due to the extreme density of the surrounding breast tissue, ultrasound will be performed for further evaluation.  Mammographic images were processed with CAD.  On physical exam, there is a firm palpable mass in the upper-outer quadrant of the right breast at the site of concern.  Targeted ultrasound is performed, showing an irregular hypoechoic mass with indistinct margins measuring 2.0 x 1.1 x 1.7 cm at 10 o'clock, 6 cm from the nipple. Blood flow seen within the mass on color Doppler imaging. Ultrasound of the right axilla demonstrates multiple  normal-appearing lymph nodes.  IMPRESSION: 1. There is a highly suspicious 2.0 cm mass at the palpable site in the right breast at 10 o'clock.  2. No evidence of right axillary lymphadenopathy.  RECOMMENDATION: Ultrasound guided biopsy is recommended for the right breast mass, and has been scheduled for 02/02/2020 at 2:45 p.m.  I have discussed the findings and recommendations with the patient. If applicable, a reminder letter will be sent to the patient regarding the next appointment.  BI-RADS CATEGORY 5: Highly suggestive of malignancy.   Electronically Signed By: [name] On: 02/01/2020 09:18          ADDITIONAL INFORMATION: FLUORESCENCE IN-SITU HYBRIDIZATION Results: GROUP 5: HER2 **NEGATIVE** Equivocal form of amplification of the HER2 gene was detected in the IHC 2+ tissue sample received from this individual. HER2 FISH was performed by a technologist and cell imaging and analysis on the BioView. RATIO OF HER2/CEN17 SIGNALS 1.14 AVERAGE HER2 COPY NUMBER PER CELL 2.00 The ratio of HER2/CEN 17 is within the range < 2.0 of HER2/CEN 17 and a copy number of HER2 signals per cell is <4.0. Arch Pathol Lab Med 1:1,2018 Virginia [name] Pathologist, Electronic Signature ( Signed 02/08/2020) PROGNOSTIC INDICATORS Results: IMMUNOHISTOCHEMICAL AND MORPHOMETRIC ANALYSIS PERFORMED MANUALLY The tumor cells are EQUIVOCAL for Her2 (2+). Her2 by FISH will be performed and results reported separately. Estrogen Receptor: 0%, NEGATIVE Progesterone Receptor: 0%, NEGATIVE Proliferation Marker Ki67: 80% COMMENT: The negative hormone receptor study(ies) in this case has 20 internal positive control. 1 of 3 Amended copy Addendum FINAL for [name], [name] ([id number]) ADDITIONAL INFORMATION:(continued) REFERENCE RANGE ESTROGEN RECEPTOR NEGATIVE 0% POSITIVE =>1% REFERENCE RANGE PROGESTERONE RECEPTOR NEGATIVE 0% POSITIVE =>1%  All controls stained  appropriately Virginia [name] Pathologist, Electronic Signature ( Signed 02/06/2020) FINAL DIAGNOSIS Diagnosis Breast, right, needle core biopsy, upper outer, 10 o'clock - INVASIVE MAMMARY CARCINOMA. Microscopic Comment The carcinoma appears nuclear grade 3. The greatest linear extent of tumor in any one core is 9 mm. E-cadherin will be reported separately. Ancillary studies will be reported separately. Results reported to [facility] on 02/05/2020. Intradepartmental consultation ([name]). ADDENDUM: Immunohistochemistry for E-cadherin is positive supporting a ductal phenotype. Virginia [name] Pathologist, Electronic Signature (Case signed 02/05/2020).  The patient is a 54 year old female.   Past Surgical History (Virginia [name], [place]; 02/12/2020 9:37 AM) Breast Biopsy Right. Oral Surgery  Diagnostic Studies History (Virginia [name], [place]; 02/12/2020 9:37 AM) Mammogram within last year Pap Smear 1-5 years ago  Allergies (Virginia [name], [name]; 02/12/2020 9:38 AM) No Known Allergies [02/12/2020]: No Known Drug Allergies [02/12/2020]: Allergies Reconciled  Medication History (Virginia [name], [name]; 02/12/2020 9:38 AM) Escitalopram Oxalate ('20MG'$  Tablet, Oral) Active. Medications Reconciled  Social History Virginia [name], [name]; 02/12/2020 9:37 AM) Alcohol use Occasional alcohol use. Caffeine use Coffee. No drug use Tobacco use Never smoker.  Family History Virginia [name], [place]; 02/12/2020 9:37 AM) Arthritis Mother. Diabetes Mellitus Father. Heart Disease Father. Heart disease in female family member before age 22 Hypertension Father, Sister. Thyroid problems Father, Sister.  Pregnancy / Birth History Virginia [name], [name]; 02/12/2020 9:37 AM) Age at menarche 13 years. Contraceptive History Contraceptive implant, Oral contraceptives. Gravida 3 Irregular periods Length (months) of breastfeeding 7-12 Maternal age 76-30 Para 2  Other Problems  (Virginia [name], [name]; 02/12/2020 9:37 AM) Anxiety Disorder Breast Cancer Lump In Breast     Review of Systems (Virginia [name]; 02/12/2020 10:23 AM) General Not Present- Appetite Loss, Chills, Fatigue, Fever, Night Sweats, Weight Gain and Weight Loss. Skin Not Present- Change in Wart/Mole, Dryness, Hives, Jaundice, New Lesions, Non-Healing Wounds, Rash and Ulcer. HEENT Not Present- Earache, Hearing Loss, Hoarseness, Nose Bleed, Oral Ulcers, Ringing in the Ears, Seasonal Allergies, Sinus Pain, Sore Throat, Visual Disturbances, Wears glasses/contact lenses and Yellow Eyes. Respiratory Not Present- Bloody sputum, Chronic Cough, Difficulty Breathing, Snoring and Wheezing. Breast Present- Breast Mass. Not Present- Breast Pain, Nipple Discharge and Skin Changes. Cardiovascular Not Present- Chest Pain, Difficulty Breathing Lying Down, Leg Cramps, Palpitations, Rapid Heart Rate, Shortness of Breath and Swelling of Extremities. Gastrointestinal Not Present- Abdominal Pain, Bloating, Bloody Stool, Change in Bowel Habits, Chronic diarrhea, Constipation, Difficulty Swallowing, Excessive gas, Gets full quickly at meals, Hemorrhoids, Indigestion, Nausea, Rectal Pain and Vomiting. Female Genitourinary Not Present- Frequency, Nocturia, Painful Urination, Pelvic Pain and Urgency. Musculoskeletal Present- Joint Pain. Not Present- Back Pain, Joint Stiffness, Muscle Pain, Muscle Weakness and Swelling of Extremities. Neurological Not Present- Decreased Memory, Fainting, Headaches, Numbness, Seizures, Tingling, Tremor, Trouble walking and Weakness. Psychiatric Present- Anxiety. Not Present- Bipolar, Change in Sleep Pattern, Depression, Fearful and Frequent crying. Endocrine Present- Hot flashes. Not Present- Cold Intolerance, Excessive Hunger, Hair Changes, Heat Intolerance and New Diabetes. Hematology Not Present- Blood Thinners, Easy Bruising, Excessive bleeding, Gland problems, HIV and Persistent  Infections. All other systems negative  Vitals (Virginia Nolan [name]; 02/12/2020 9:38 AM) 02/12/2020 9:38 AM Weight: 139.38 lb Height: 61in Body Surface Area: 1.62 m Body Mass Index: 26.33 kg/m  Temp.: 98.54F  Pulse: 98 (Regular)  BP: 114/68 (Sitting, Left Arm, Standard)        Physical Exam (Quanda Pavlicek A. Marcellino Fidalgo [name]; 02/12/2020 10:22 AM)  General Mental Status-Alert. General Appearance-Consistent with stated age. Hydration-Well  hydrated. Voice-Normal.  Head and Neck Head-normocephalic, atraumatic with no lesions or palpable masses. Trachea-midline. Thyroid Gland Characteristics - normal size and consistency.  Chest and Lung Exam Note: Work of breathing normal. No audible wheezing  Breast Note: Bruising right breast upper outer quadrant. Hematoma noted which is small. No other masses in either breast noted.  Cardiovascular Note: NSR noJVD  Neurologic Neurologic evaluation reveals -alert and oriented x 3 with no impairment of recent or remote memory. Mental Status-Normal.  Neuropsychiatric Examination of related systems reveals-The patient is well-nourished and well-groomed. Orientation-oriented X3.  Musculoskeletal Normal Exam - Left-Upper Extremity Strength Normal and Lower Extremity Strength Normal. Normal Exam - Right-Upper Extremity Strength Normal and Lower Extremity Strength Normal.  Lymphatic Head & Neck  General Head & Neck Lymphatics: Bilateral - Description - Normal. Axillary  General Axillary Region: Bilateral - Description - Normal. Tenderness - Non Tender.    Assessment & Plan (Miarose Lippert A. Knute Mazzuca [name]; 02/12/2020 10:23 AM)  BREAST CANCER, RIGHT (C50.911) Impression: T2 N0 MX grade 3 IDC triple negative  Discuss port placement today for neoadjuvant chemotherapy. Definitive surgery after chemotherapy completes Magnetic resonance imaging/genetics  Pt requires port placement for chemotherapy. Risk include  bleeding, infection, pneumothorax, hemothorax, mediastinal injury, nerve injury , blood vessel injury, strke, blood clots, death, migration. embolization and need for additional procedures. Pt agrees to proceed.  Current Plans Pt Education - [facility] Portacath HCI Use of a central venous catheter for intravenous therapy was discussed. Technique of catheter placement using ultrasound and fluoroscopy guidance was discussed. Risks such as bleeding, infection, pneumothorax, catheter occlusion, reoperation, and other risks were discussed. I noted a good likelihood this will help address the problem. Questions were answered. The patient expressed understanding & wishes to proceed.

## 2020-02-12 NOTE — Telephone Encounter (Signed)
Scheduled per 03/25 los, patient has been called and notified. 

## 2020-02-13 ENCOUNTER — Telehealth: Payer: Self-pay | Admitting: Hematology and Oncology

## 2020-02-13 ENCOUNTER — Encounter: Payer: Self-pay | Admitting: *Deleted

## 2020-02-13 ENCOUNTER — Encounter: Payer: Self-pay | Admitting: Hematology and Oncology

## 2020-02-13 NOTE — Progress Notes (Signed)
Called pt to introduce myself as her Arboriculturist and to discuss copay assistance.  Pt gave me consent to apply in her behalf so I enrolledherin the [employer] First Step program forNeulastafor $10,000 per calendar yearfrom3/30/21.  Pt will pay $0 for herfirstdose or cycleand $94for each subsequent dose or cycle.  Pt exceeds the income requirement for the [employer] so she doesn't qualify at this time.  I will give her my card at her next visit for any questions or concerns she may have in the future.

## 2020-02-13 NOTE — Telephone Encounter (Signed)
Scheduled appt per 3/29 sch message - pt is aware of appts

## 2020-02-14 ENCOUNTER — Other Ambulatory Visit: Payer: Self-pay

## 2020-02-14 ENCOUNTER — Ambulatory Visit (HOSPITAL_COMMUNITY)
Admission: RE | Admit: 2020-02-14 | Discharge: 2020-02-14 | Disposition: A | Discharge status: Home / Self Care | Payer: 59 | Source: Ambulatory Visit | Attending: Hematology and Oncology | Admitting: Hematology and Oncology

## 2020-02-14 ENCOUNTER — Telehealth: Payer: Self-pay | Admitting: *Deleted

## 2020-02-14 ENCOUNTER — Other Ambulatory Visit: Payer: Self-pay | Admitting: *Deleted

## 2020-02-14 ENCOUNTER — Ambulatory Visit
Admission: RE | Admit: 2020-02-14 | Discharge: 2020-02-14 | Disposition: A | Discharge status: Home / Self Care | Payer: 59 | Source: Ambulatory Visit | Attending: Hematology and Oncology | Admitting: Hematology and Oncology

## 2020-02-14 ENCOUNTER — Other Ambulatory Visit: Payer: Self-pay | Admitting: Lab

## 2020-02-14 DIAGNOSIS — C50411 Malignant neoplasm of upper-outer quadrant of right female breast: Secondary | ICD-10-CM

## 2020-02-14 DIAGNOSIS — Z171 Estrogen receptor negative status [ER-]: Secondary | ICD-10-CM | POA: Insufficient documentation

## 2020-02-14 DIAGNOSIS — C50911 Malignant neoplasm of unspecified site of right female breast: Secondary | ICD-10-CM | POA: Diagnosis not present

## 2020-02-14 IMAGING — CT CT CHEST W/ CM
2 of 5 series · 13 of 36 positions shown, 16 images · IV contrast (Omni 300)
Comparison: None.

CLINICAL DATA: Newly diagnosed right breast cancer, for staging

EXAM:
CT CHEST, ABDOMEN, AND PELVIS WITH CONTRAST
TECHNIQUE: Multidetector CT imaging of the chest, abdomen and pelvis was
performed following the standard protocol during bolus
administration of intravenous contrast.
CONTRAST:  100mL OMNIPAQUE IOHEXOL 300 MG/ML  SOLN

[Series 3: cap with 5mm st · axial · 0.98mm/px · z∈[+747,+1287]mm · 10 of 133 slices shown, 13 images]
[im 13/133  mediastinal]
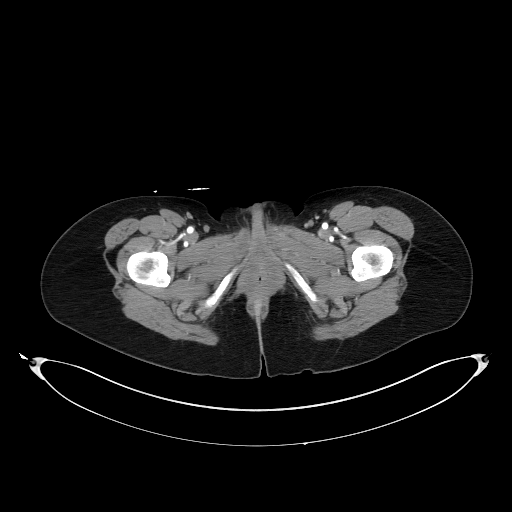
[im 13/133  lung]
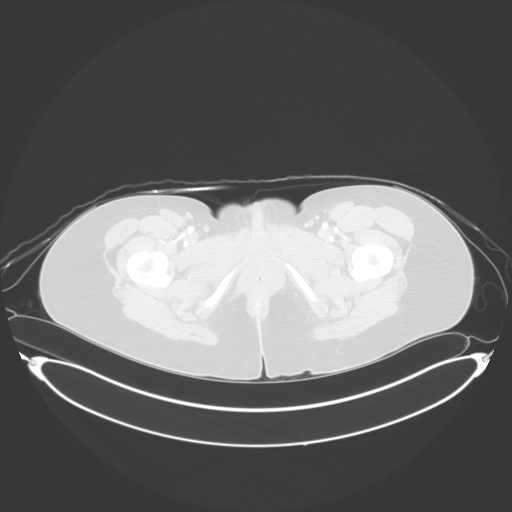
[im 25/133  lung]
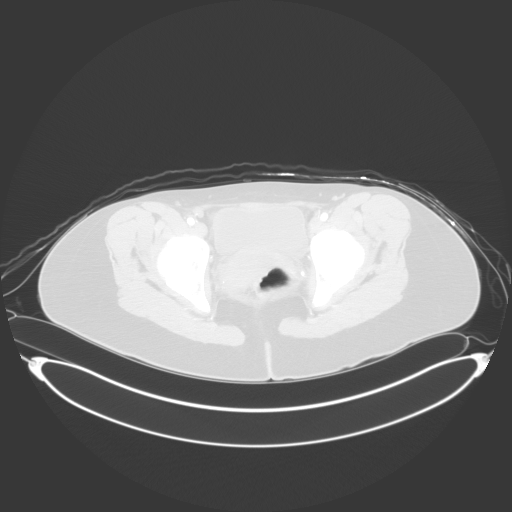
[im 37/133  lung]
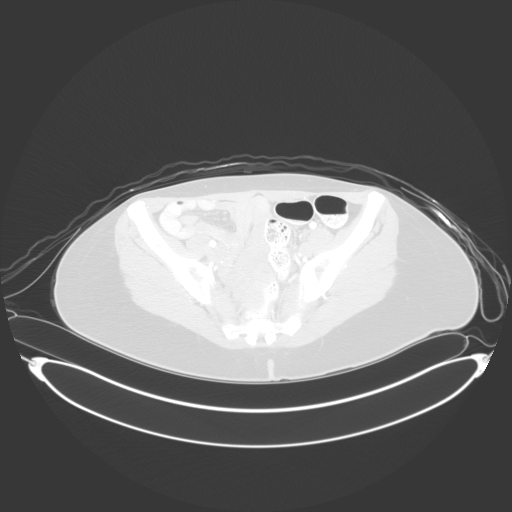
[im 49/133  lung]
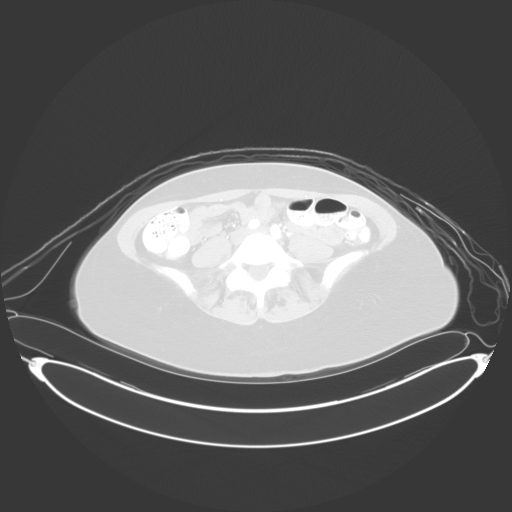
[im 61/133  mediastinal]
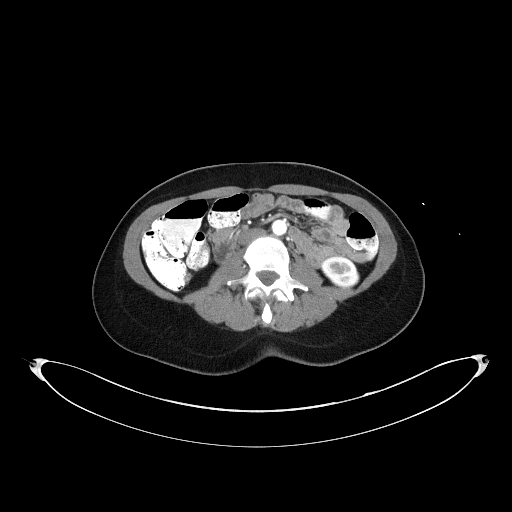
[im 61/133  lung]
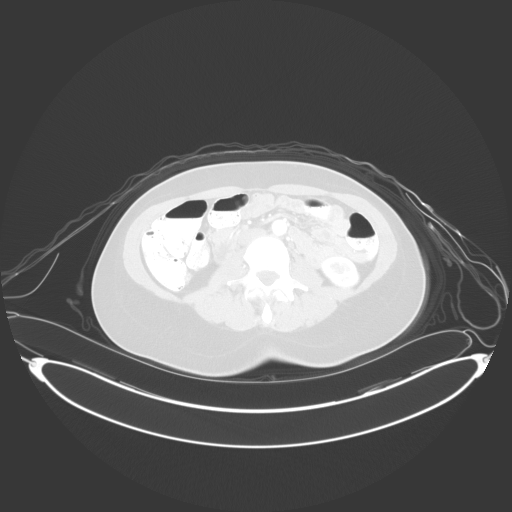
[im 73/133  lung]
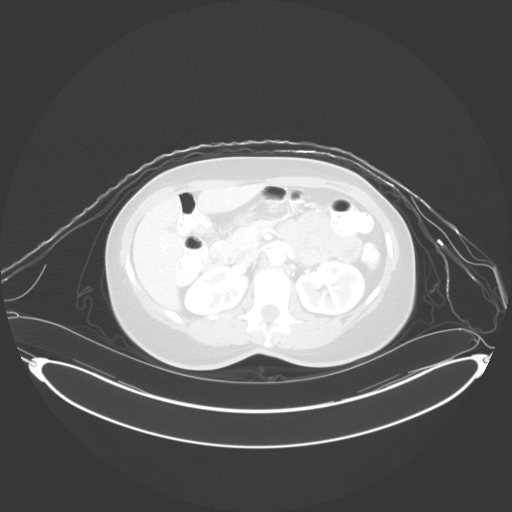
[im 85/133  lung]
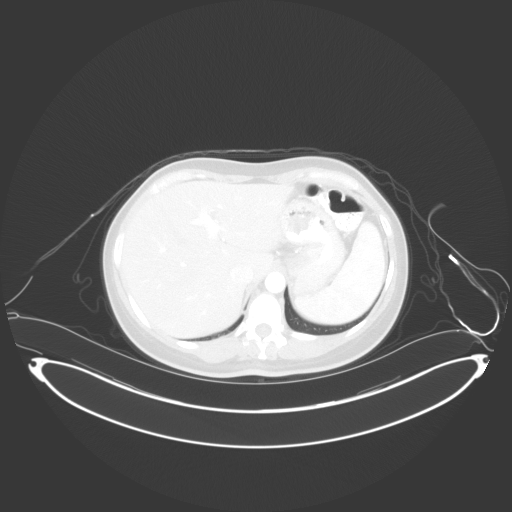
[im 97/133  lung]
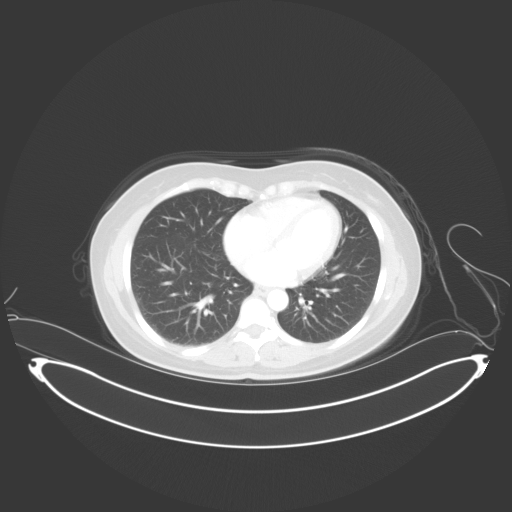
[im 109/133  mediastinal]
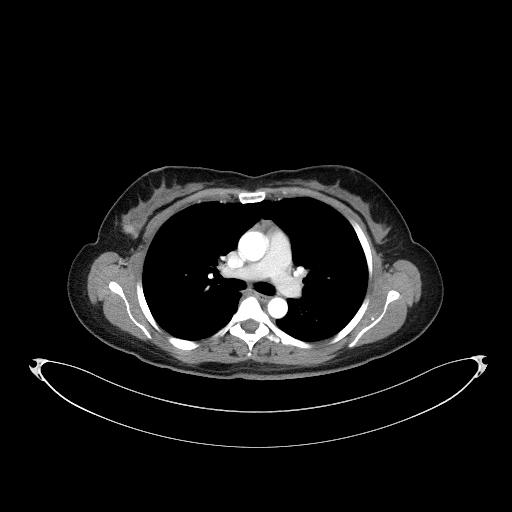
[im 109/133  lung]
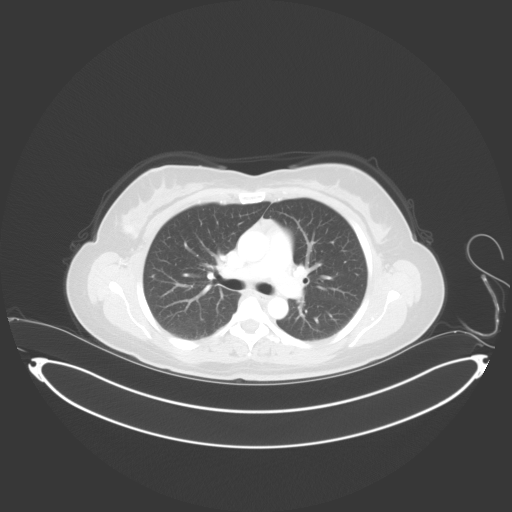
[im 121/133  lung]
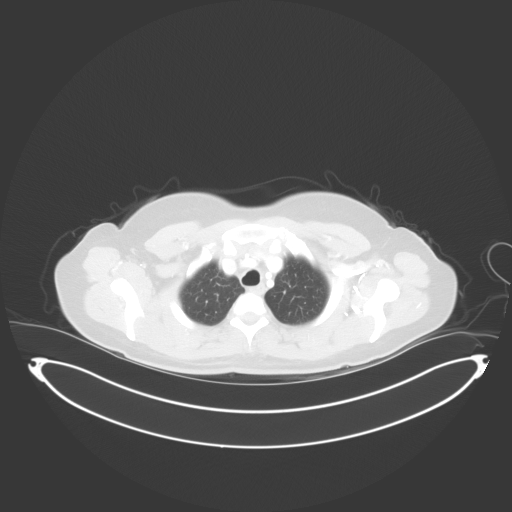

[Series 5: cap with 3mm st cor · coronal · 0.94mm/px · 3 of 150 slices shown]
[im 30/150  lung]
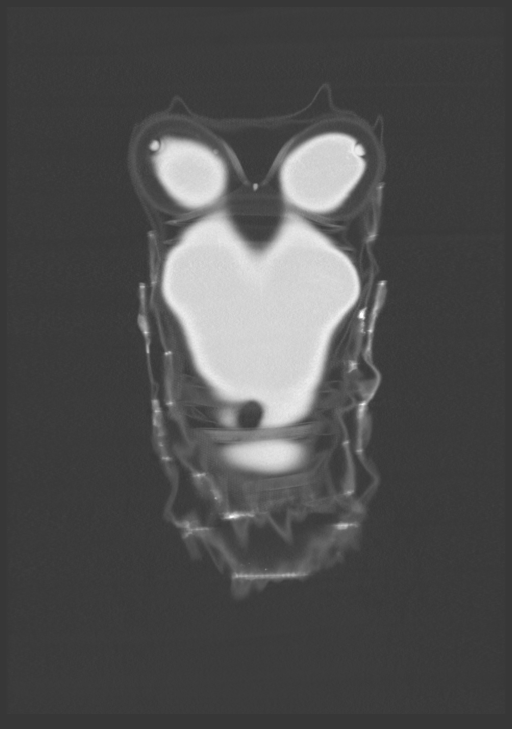
[im 60/150  lung]
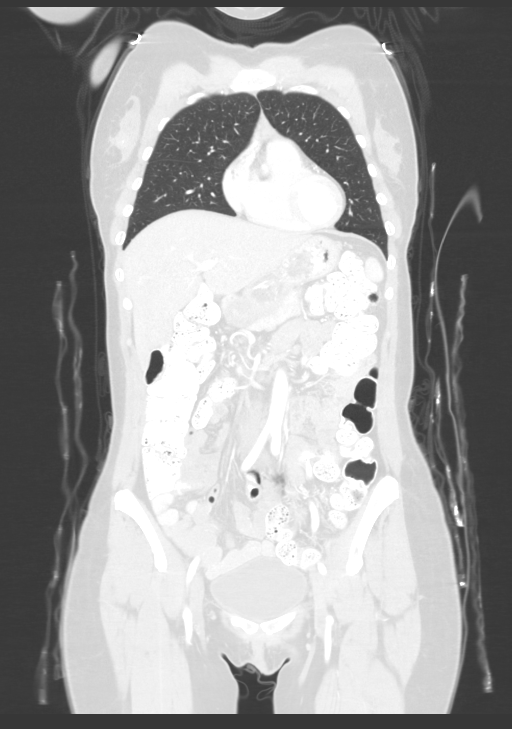
[im 90/150  lung]
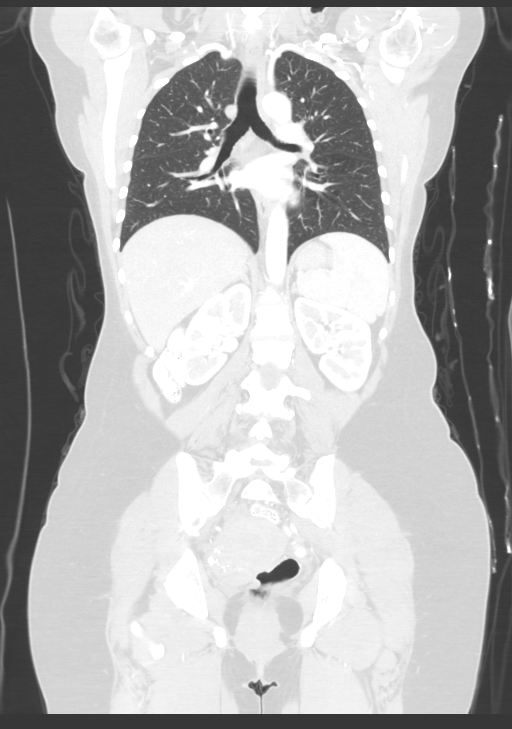

[13 of 36 positions shown; findings below may reference images not displayed]

FINDINGS: CT CHEST FINDINGS

Cardiovascular: The heart is normal in size. No pericardial
effusion.

No evidence of thoracic aortic aneurysm.

Mediastinum/Nodes: No suspicious mediastinal, hilar, or left
axillary lymphadenopathy.

5 mm short axis right axillary node (series 3/image 18) is not
enlarged by size criteria but merits follow-up.

Lungs/Pleura: Lungs are clear.

No suspicious pulmonary nodules.

No focal consolidation.

No pleural effusion or pneumothorax.

Musculoskeletal: 1.8 cm enhancing lesion in the lateral right breast
(series 3/image 26), likely corresponding to the known primary
breast neoplasm.

Visualized osseous structures are within normal limits.

CT ABDOMEN PELVIS FINDINGS

Hepatobiliary: Liver is within normal limits, noting a 4 mm probable
cyst in the left hepatic lobe (series 3/image 45).

Gallbladder is unremarkable. No intrahepatic or extrahepatic ductal
dilatation.

Pancreas: Within normal limits.

Spleen: Within normal limits.

Adrenals/Urinary Tract: Adrenal glands are within normal limits.

7 mm cyst in the anterior left upper kidney (series 8/image 17).
Right kidney is within normal limits. No hydronephrosis.

Bladder is within normal limits.

Stomach/Bowel: Stomach is within normal limits.

No evidence of bowel obstruction.

Appendix is not discretely visualized.

Vascular/Lymphatic: No evidence of abdominal aortic aneurysm.

No suspicious abdominopelvic lymphadenopathy.

Reproductive: Heterogeneous uterus, suggesting uterine fibroids.

Bilateral ovaries are unremarkable.

Other: No abdominopelvic ascites.

Musculoskeletal: Visualized osseous structures are within normal
limits.
IMPRESSION: 1.8 cm enhancing lesion in the lateral right breast, likely
corresponding to the patient's known primary breast neoplasm.

5 mm short axis right axillary node, within normal limits for size,
although warranting attention on follow-up.

No findings specific for metastatic disease.

## 2020-02-14 IMAGING — CT CT ABD-PELV W/ CM
2 of 5 series · 13 of 36 positions shown, 16 images · IV contrast (omnipaque)
Comparison: None.

CLINICAL DATA: Newly diagnosed right breast cancer, for staging

EXAM:
CT CHEST, ABDOMEN, AND PELVIS WITH CONTRAST
TECHNIQUE: Multidetector CT imaging of the chest, abdomen and pelvis was
performed following the standard protocol during bolus
administration of intravenous contrast.
CONTRAST:  100mL OMNIPAQUE IOHEXOL 300 MG/ML  SOLN

[Series 3: cap with 5mm st · axial · 0.98mm/px · z∈[+747,+1287]mm · 10 of 133 slices shown, 13 images]
[im 13/133  mediastinal]
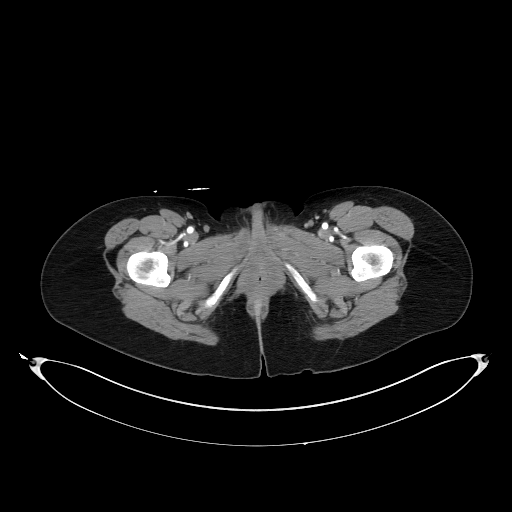
[im 13/133  lung]
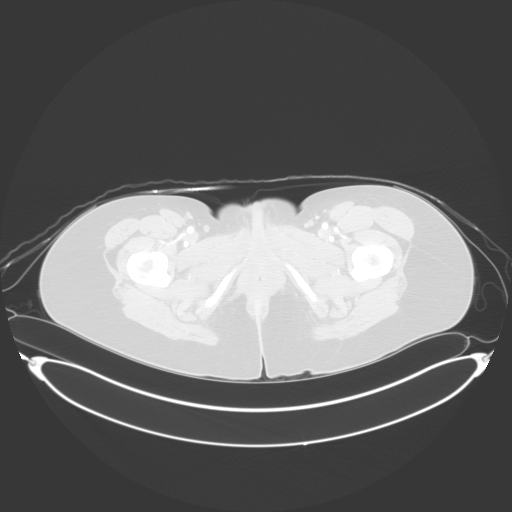
[im 25/133  lung]
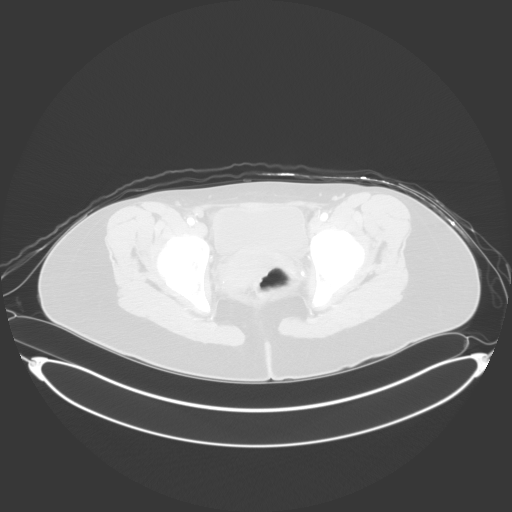
[im 37/133  lung]
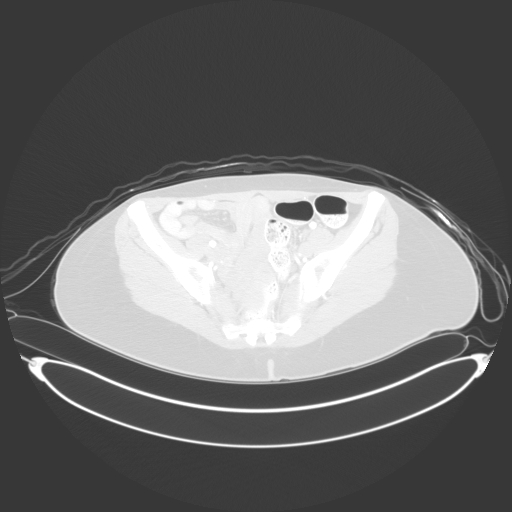
[im 49/133  lung]
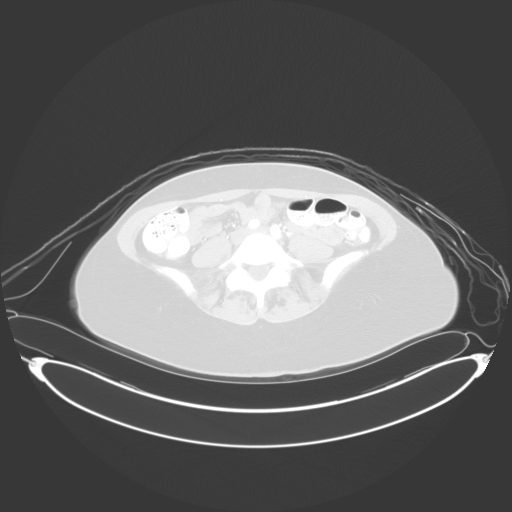
[im 61/133  mediastinal]
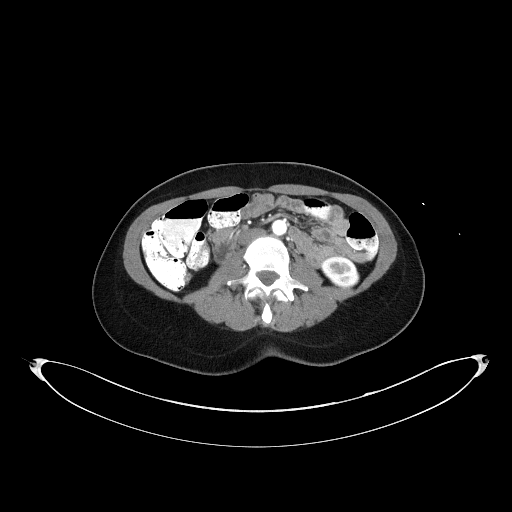
[im 61/133  lung]
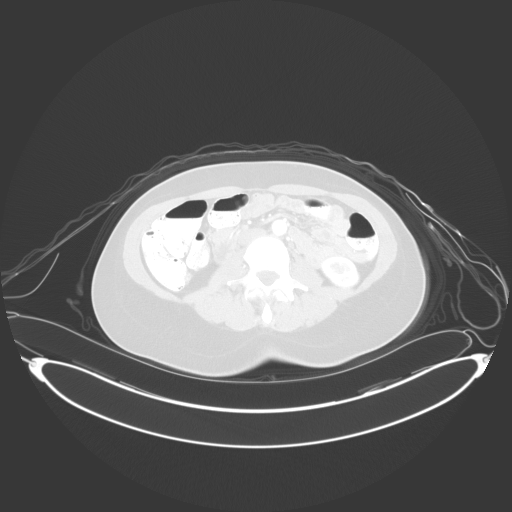
[im 73/133  lung]
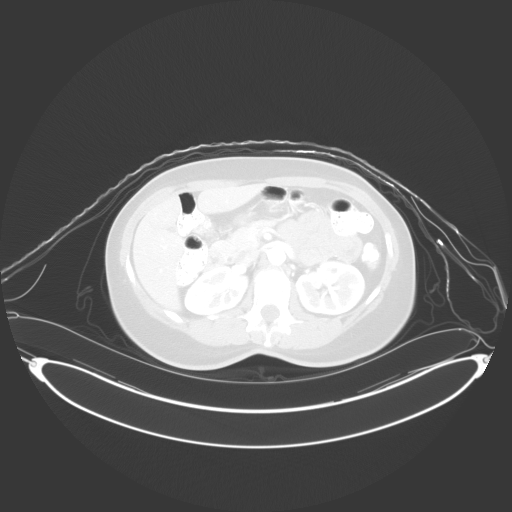
[im 85/133  lung]
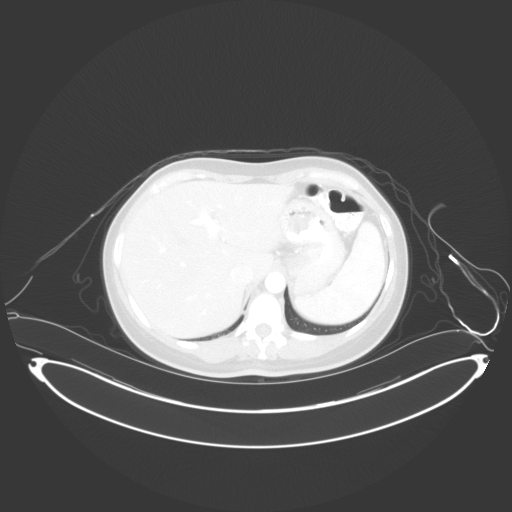
[im 97/133  lung]
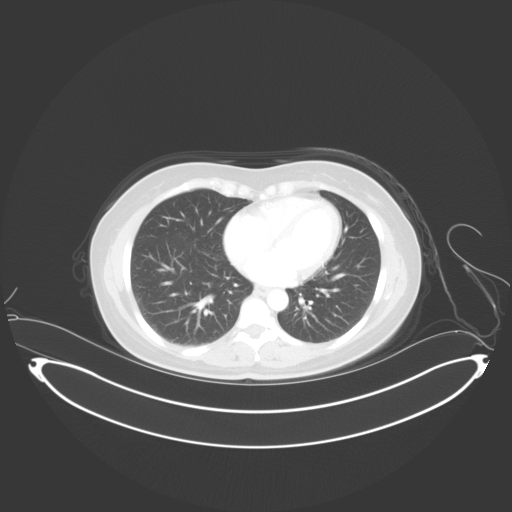
[im 109/133  mediastinal]
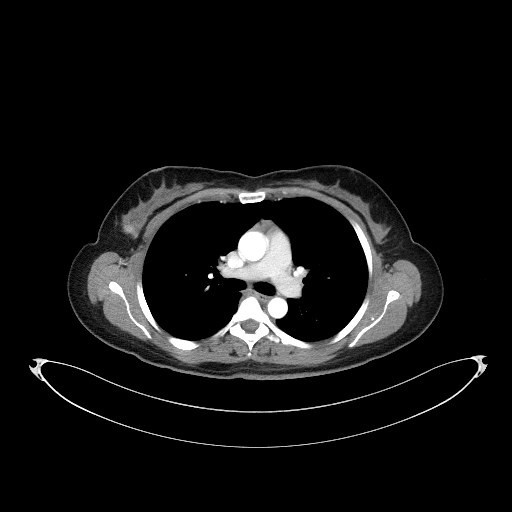
[im 109/133  lung]
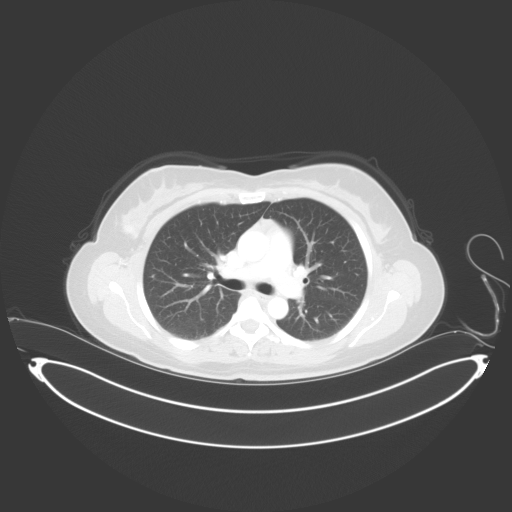
[im 121/133  lung]
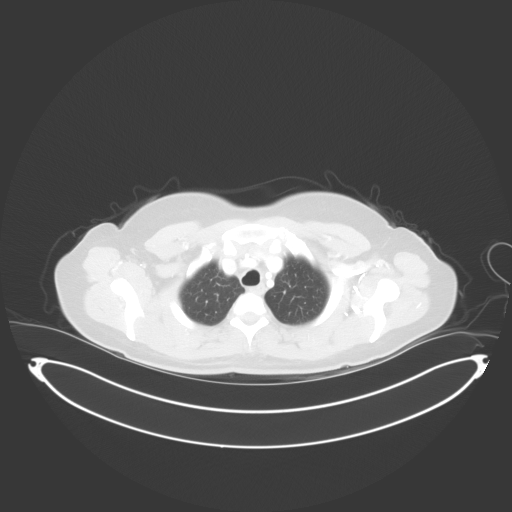

[Series 5: cap with 3mm st cor · coronal · 0.94mm/px · 3 of 150 slices shown]
[im 30/150  lung]
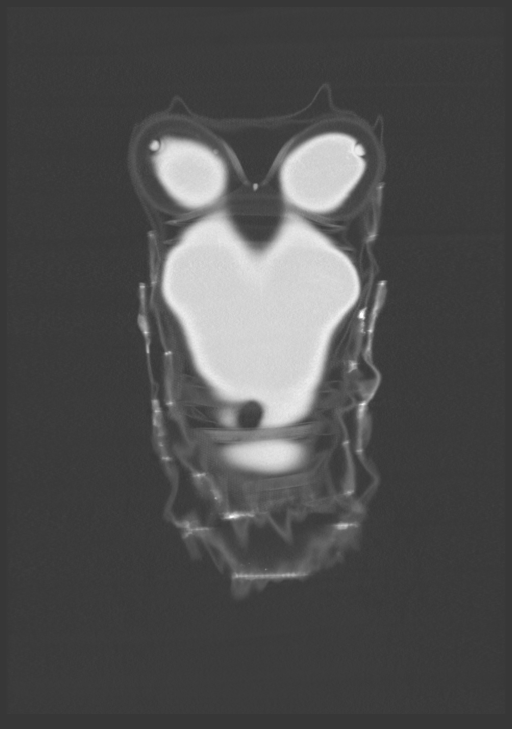
[im 60/150  lung]
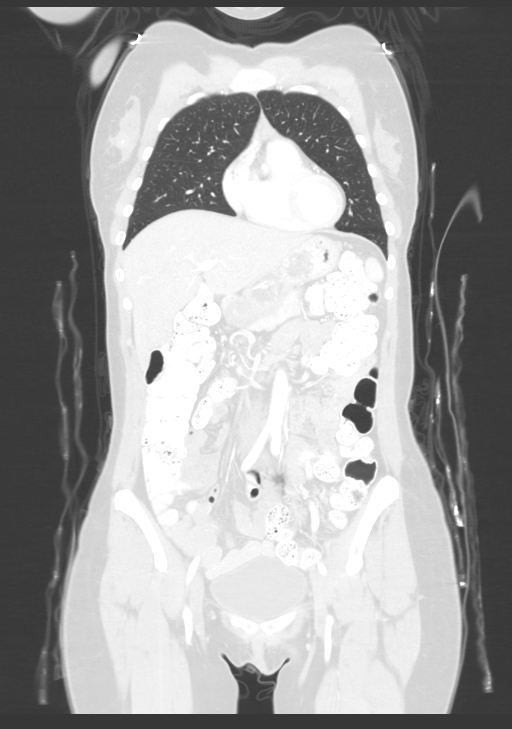
[im 90/150  lung]
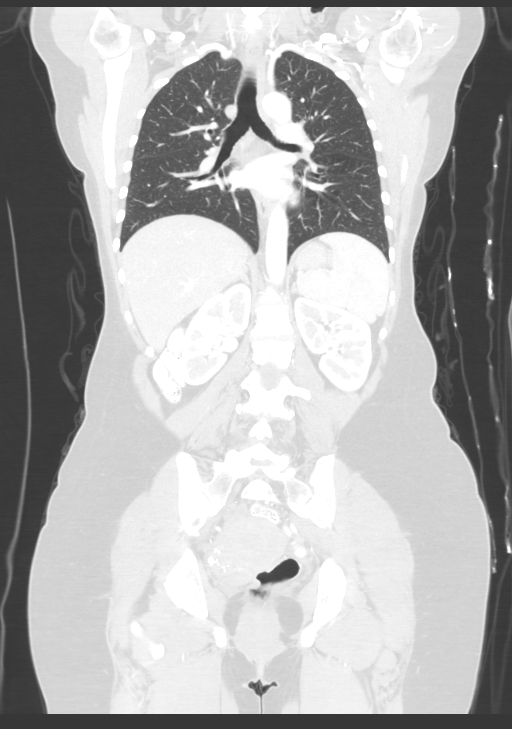

[13 of 36 positions shown; findings below may reference images not displayed]

FINDINGS: CT CHEST FINDINGS

Cardiovascular: The heart is normal in size. No pericardial
effusion.

No evidence of thoracic aortic aneurysm.

Mediastinum/Nodes: No suspicious mediastinal, hilar, or left
axillary lymphadenopathy.

5 mm short axis right axillary node (series 3/image 18) is not
enlarged by size criteria but merits follow-up.

Lungs/Pleura: Lungs are clear.

No suspicious pulmonary nodules.

No focal consolidation.

No pleural effusion or pneumothorax.

Musculoskeletal: 1.8 cm enhancing lesion in the lateral right breast
(series 3/image 26), likely corresponding to the known primary
breast neoplasm.

Visualized osseous structures are within normal limits.

CT ABDOMEN PELVIS FINDINGS

Hepatobiliary: Liver is within normal limits, noting a 4 mm probable
cyst in the left hepatic lobe (series 3/image 45).

Gallbladder is unremarkable. No intrahepatic or extrahepatic ductal
dilatation.

Pancreas: Within normal limits.

Spleen: Within normal limits.

Adrenals/Urinary Tract: Adrenal glands are within normal limits.

7 mm cyst in the anterior left upper kidney (series 8/image 17).
Right kidney is within normal limits. No hydronephrosis.

Bladder is within normal limits.

Stomach/Bowel: Stomach is within normal limits.

No evidence of bowel obstruction.

Appendix is not discretely visualized.

Vascular/Lymphatic: No evidence of abdominal aortic aneurysm.

No suspicious abdominopelvic lymphadenopathy.

Reproductive: Heterogeneous uterus, suggesting uterine fibroids.

Bilateral ovaries are unremarkable.

Other: No abdominopelvic ascites.

Musculoskeletal: Visualized osseous structures are within normal
limits.
IMPRESSION: 1.8 cm enhancing lesion in the lateral right breast, likely
corresponding to the patient's known primary breast neoplasm.

5 mm short axis right axillary node, within normal limits for size,
although warranting attention on follow-up.

No findings specific for metastatic disease.

## 2020-02-14 IMAGING — MR MR BREAST BILAT WO/W CM
8 of 13 series · 28 of 48 positions shown · IV contrast (gadavist)
Comparison: Previous exam(s).

CLINICAL DATA: 54-year-old female with newly diagnosed right breast
cancer.

LABS:  None performed on site.
EXAM:
BILATERAL BREAST MRI WITH AND WITHOUT CONTRAST
TECHNIQUE: Multiplanar, multisequence MR images of both breasts were obtained
prior to and following the intravenous administration of 6 ml of
Gadavist.

[Series 2: t2_tirm_tra ipat (a-p) · axial · 3.0mm · 0.66mm/px · 1 of 59 slices shown]
[im 1/59]
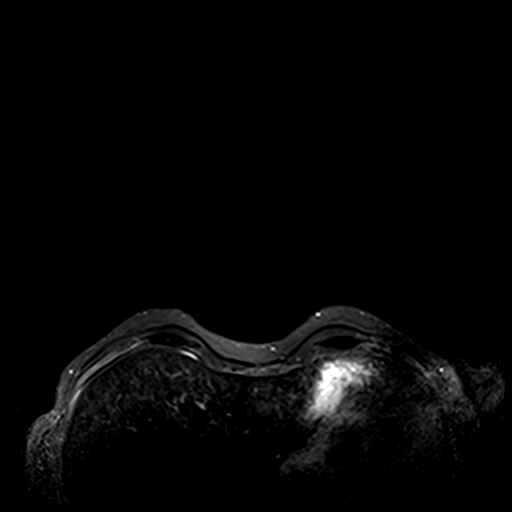

[Series 3: fl3d pre-cm no · axial · non-contrast · 0.9mm · 0.89mm/px · z∈[-85,+87]mm · 5 of 192 slices shown]
[im 1/192]
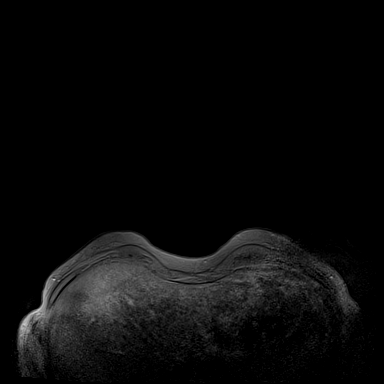
[im 48/192]
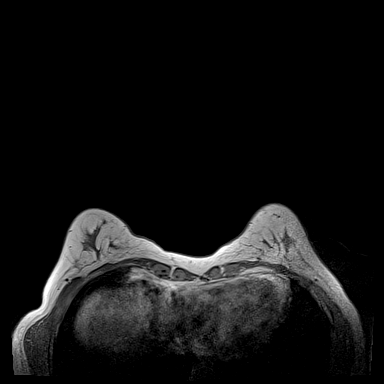
[im 96/192]
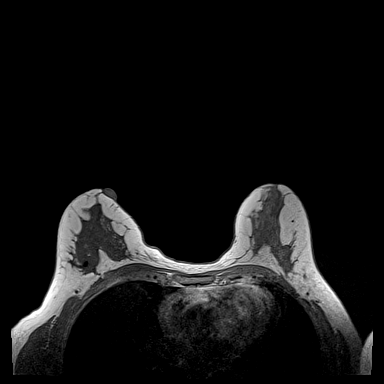
[im 144/192]
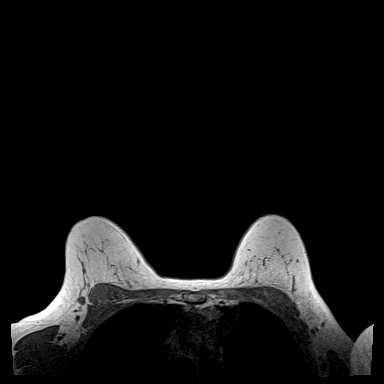
[im 192/192]
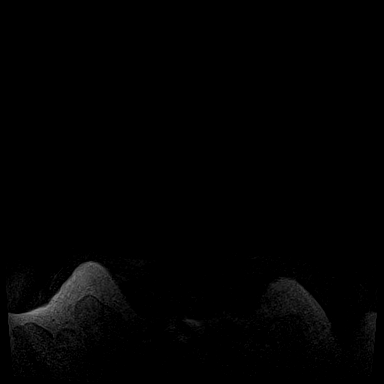

[Series 4: fl3d pre-cm · axial · non-contrast · 0.9mm · 0.82mm/px · z∈[-85,+87]mm · 5 of 192 slices shown]
[im 1/192]
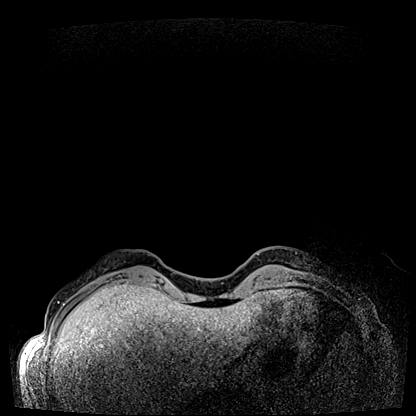
[im 48/192]
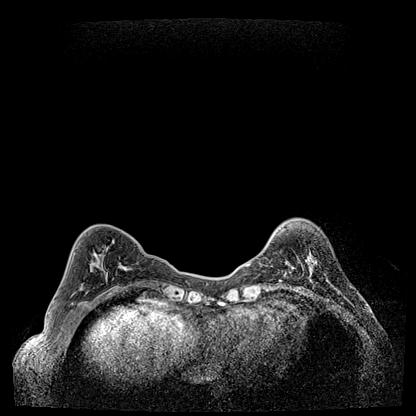
[im 96/192]
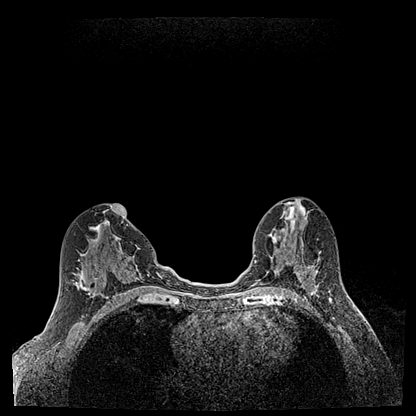
[im 144/192]
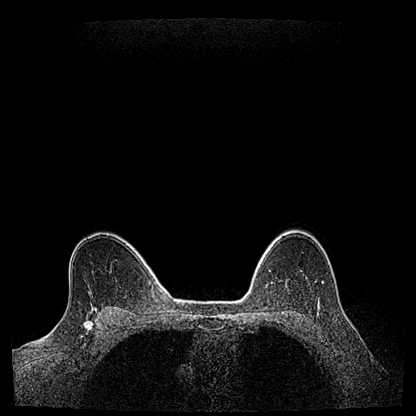
[im 192/192]
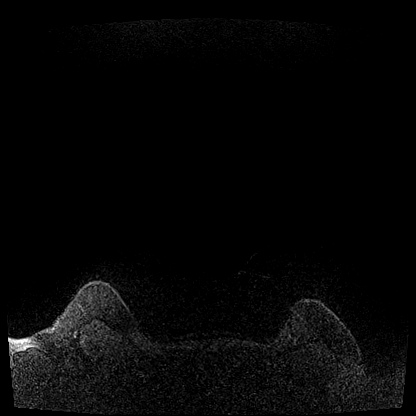

[Series 5: fl3d post-cm 20 · axial · 0.9mm · 0.82mm/px · z∈[-85,+87]mm · 5 of 192 slices shown (1 of 3)]
[im 1/192]
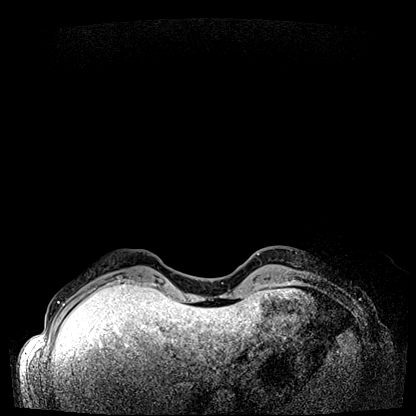
[im 48/192]
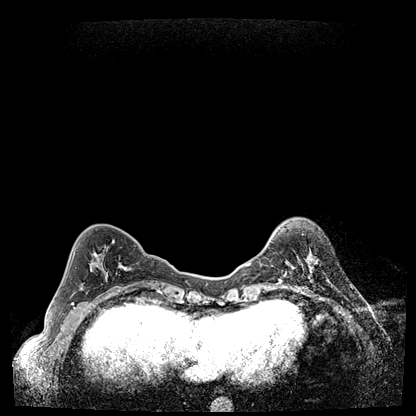
[im 96/192]
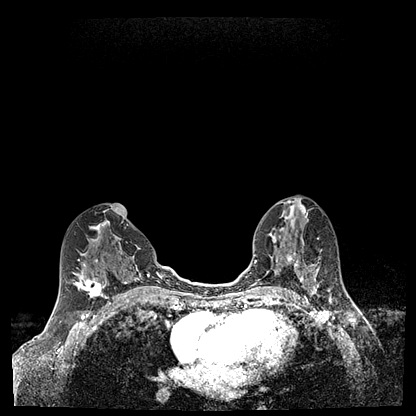
[im 144/192]
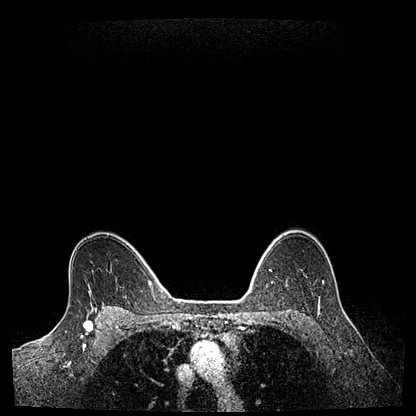
[im 192/192]
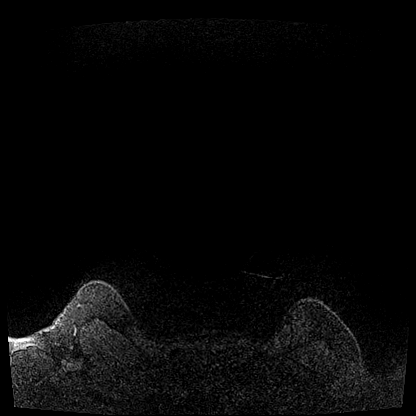

[Series 6: fl3d post-cm 20 · axial · 0.9mm · 0.82mm/px · z∈[-85,+87]mm · 5 of 192 slices shown (2 of 3)]
[im 1/192]
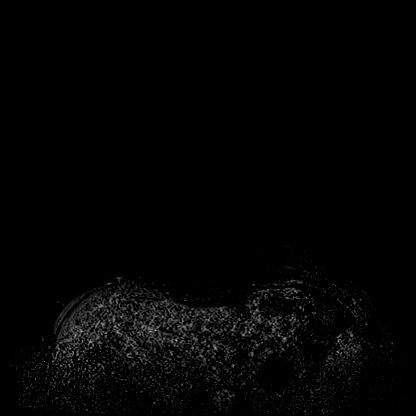
[im 48/192]
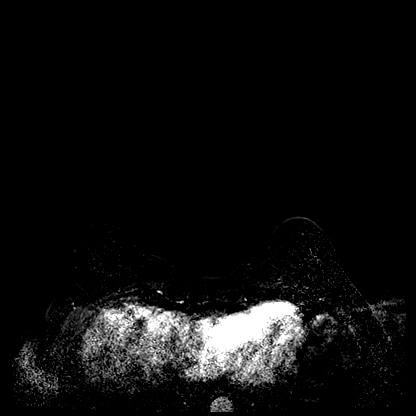
[im 96/192]
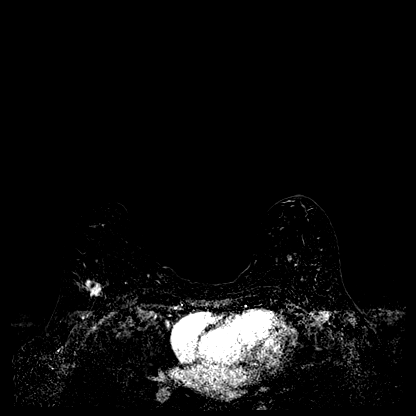
[im 144/192]
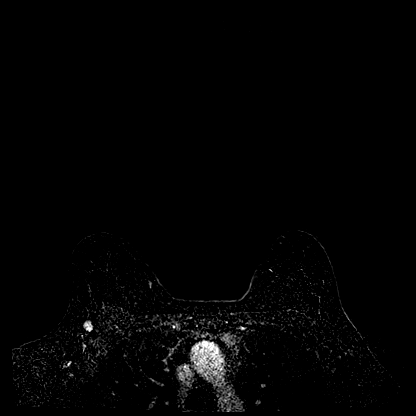
[im 192/192]
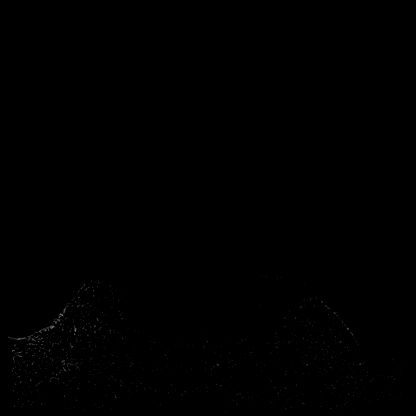

[Series 7: fl3d post-cm 20 · axial · 172.8mm · 0.82mm/px · 1 of 1 slices shown (3 of 3)]
[im 1/1]
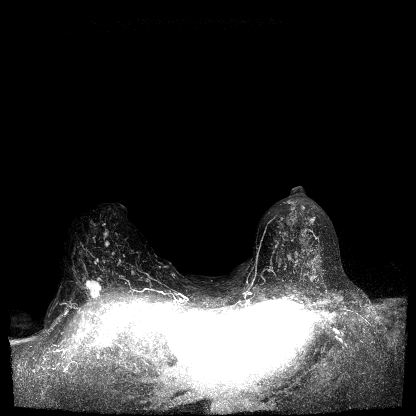

[Series 8: fl3d post-cm 3min · axial · 0.9mm · 0.82mm/px · z∈[-85,+87]mm · 5 of 192 slices shown]
[im 1/192]
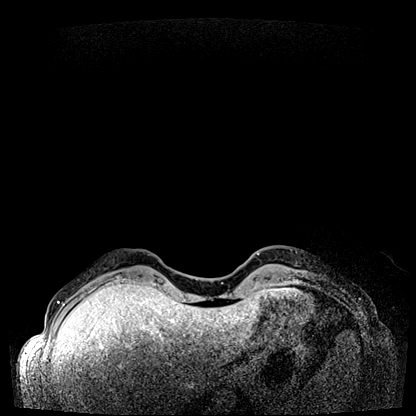
[im 48/192]
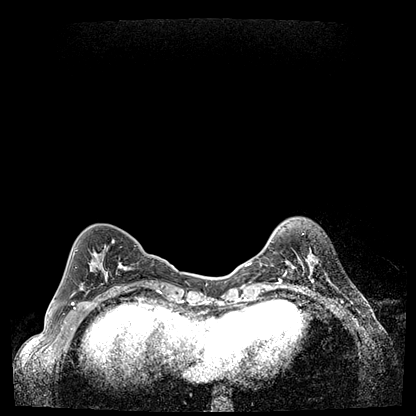
[im 96/192]
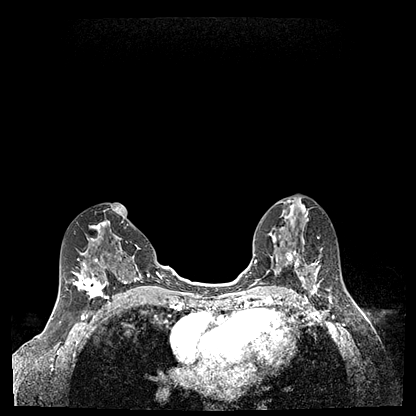
[im 144/192]
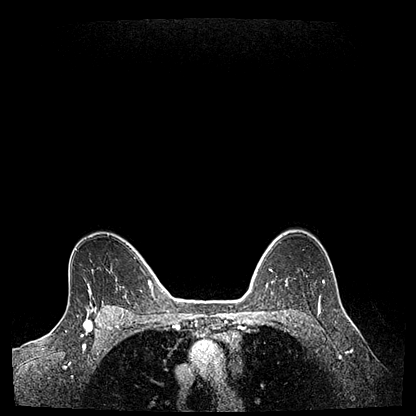
[im 192/192]
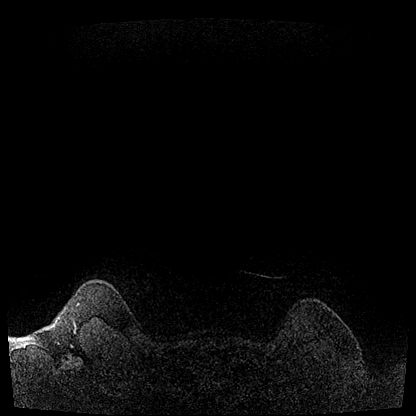

[Series 9: fl3d post-cm 3min_sub · axial · 0.9mm · 0.82mm/px · 1 of 192 slices shown]
[im 1/192]
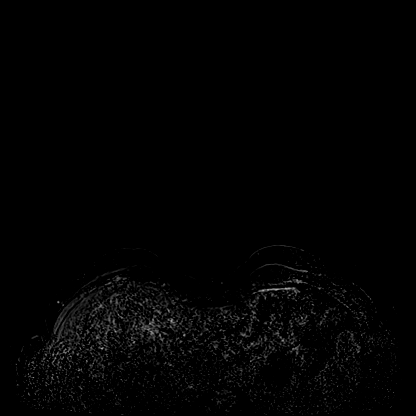

[28 of 48 positions shown; findings below may reference images not displayed]

Three-dimensional MR images were rendered by post-processing of the
original MR data on an independent workstation. The
three-dimensional MR images were interpreted, and findings are
reported in the following complete MRI report for this study. Three
dimensional images were evaluated at the independent [employer]
workstation
FINDINGS: Breast composition: d. Extreme fibroglandular tissue.

Background parenchymal enhancement: Mild to moderate.

Right breast: Susceptibility artifact is seen in association with a
spiculated enhancing mass in the lateral right breast at posterior
depth (series 6, image 93/192). It measures 1.5 x 1.5 x 2.0 cm and
is consistent with the patient's biopsied site of malignancy.

A 4 mm enhancing focus is demonstrated just anterior to the index
lesion (series 6, image 93/192). This demonstrates washout kinetics
without associated T2 correlate. Two additional 5 mm enhancing
masses are noted in the central inferior right breast at anterior
and posterior depth (series 6, image 114 and 130 of 192). These also
demonstrate suspicious washout kinetics in no T2 correlate.

Left breast: No dominant, suspicious mass or abnormal enhancement.

Lymph nodes: There is a single prominent low lying right axillary
lymph node (series 6, image 49/192) with complete effacement of the
normal fatty hilum. The cortex measures up to 6 mm. No additional
suspicious axillary or internal mammary chain lymphadenopathy noted.

Ancillary findings:  None.
IMPRESSION: 1. 2 cm enhancing mass in the lateral right breast consistent with
the patient's biopsy-proven site of malignancy.
2. Three indeterminate 4 and 5 mm enhancing masses in the central
and inferior right breast (series 6, images 93, 114 and 130).
Recommendation is for MRI guided biopsy of the 2 largest masses.
3. Single prominent low lying right axillary lymph node (series 6,
image 49). This lymph node may not have been fully included during
evaluation of the right axilla. Recommendation is for second-look
ultrasound with specific attention to the far posterior upper outer
quadrant/low axilla.
4. No MRI evidence of malignancy on the left.

RECOMMENDATION:
1. Two area MRI guided biopsy of 2 additional enhancing masses in
the central and inferior right breast (series 6, images 114 and
130).
2. Second look ultrasound with special attention to the posterior
upper outer quadrant/low axilla on the right for further evaluation
of a single prominent low lying lymph node.

BI-RADS CATEGORY  4: Suspicious.

## 2020-02-14 MED ORDER — IOHEXOL 300 MG/ML  SOLN
100.0000 mL | Freq: Once | INTRAMUSCULAR | Status: AC | PRN
  Administered 2020-02-14: 100 mL via INTRAVENOUS

## 2020-02-14 MED ORDER — GADOBUTROL 1 MMOL/ML IV SOLN
6.0000 mL | Freq: Once | INTRAVENOUS | Status: AC | PRN
  Administered 2020-02-14: 6 mL via INTRAVENOUS

## 2020-02-14 NOTE — Telephone Encounter (Signed)
Called pt to discuss breast MRI results. Discussed need for MR bx x2 on right as well as [place] of axilla. Pt relate she has decided she will have a mastectomy. Informed pt that I will reach out to physician team regarding need for MR bx in light of mastectomy. Denies further questions or needs. Informed pt I will call with CT scan results and physician decision on MR bx. Received understanding.

## 2020-02-15 ENCOUNTER — Inpatient Hospital Stay: Payer: 59

## 2020-02-15 ENCOUNTER — Other Ambulatory Visit: Payer: Self-pay

## 2020-02-15 ENCOUNTER — Other Ambulatory Visit (HOSPITAL_COMMUNITY): Payer: 59

## 2020-02-15 ENCOUNTER — Other Ambulatory Visit: Payer: Self-pay | Admitting: Hematology and Oncology

## 2020-02-15 ENCOUNTER — Encounter: Payer: Self-pay | Admitting: Licensed Clinical Social Worker

## 2020-02-15 ENCOUNTER — Encounter: Payer: Self-pay | Admitting: *Deleted

## 2020-02-15 ENCOUNTER — Telehealth: Payer: Self-pay | Admitting: *Deleted

## 2020-02-15 ENCOUNTER — Inpatient Hospital Stay: Payer: 59 | Attending: Hematology and Oncology | Admitting: Licensed Clinical Social Worker

## 2020-02-15 ENCOUNTER — Other Ambulatory Visit: Payer: Self-pay | Admitting: *Deleted

## 2020-02-15 ENCOUNTER — Other Ambulatory Visit: Payer: Self-pay | Admitting: Licensed Clinical Social Worker

## 2020-02-15 DIAGNOSIS — Z801 Family history of malignant neoplasm of trachea, bronchus and lung: Secondary | ICD-10-CM

## 2020-02-15 DIAGNOSIS — Z79899 Other long term (current) drug therapy: Secondary | ICD-10-CM | POA: Insufficient documentation

## 2020-02-15 DIAGNOSIS — C50411 Malignant neoplasm of upper-outer quadrant of right female breast: Secondary | ICD-10-CM

## 2020-02-15 DIAGNOSIS — Z171 Estrogen receptor negative status [ER-]: Secondary | ICD-10-CM | POA: Insufficient documentation

## 2020-02-15 DIAGNOSIS — Z803 Family history of malignant neoplasm of breast: Secondary | ICD-10-CM | POA: Diagnosis not present

## 2020-02-15 DIAGNOSIS — T451X5A Adverse effect of antineoplastic and immunosuppressive drugs, initial encounter: Secondary | ICD-10-CM | POA: Insufficient documentation

## 2020-02-15 DIAGNOSIS — R5383 Other fatigue: Secondary | ICD-10-CM | POA: Insufficient documentation

## 2020-02-15 DIAGNOSIS — Z800 Family history of malignant neoplasm of digestive organs: Secondary | ICD-10-CM | POA: Diagnosis not present

## 2020-02-15 DIAGNOSIS — K219 Gastro-esophageal reflux disease without esophagitis: Secondary | ICD-10-CM | POA: Insufficient documentation

## 2020-02-15 DIAGNOSIS — R590 Localized enlarged lymph nodes: Secondary | ICD-10-CM | POA: Insufficient documentation

## 2020-02-15 DIAGNOSIS — Z5111 Encounter for antineoplastic chemotherapy: Secondary | ICD-10-CM | POA: Insufficient documentation

## 2020-02-15 DIAGNOSIS — R110 Nausea: Secondary | ICD-10-CM | POA: Insufficient documentation

## 2020-02-15 LAB — GENETIC SCREENING ORDER

## 2020-02-15 NOTE — Progress Notes (Signed)
REFERRING PROVIDER: Nicholas Lose, MD [place],  [place] [id number]  PRIMARY PROVIDER:  Virginia Crews, MD  PRIMARY REASON FOR VISIT:  1. Malignant neoplasm of upper-outer quadrant of right breast in female, estrogen receptor negative ([place])   2. Family history of pancreatic cancer   3. Family history of breast cancer   4. Family history of lung cancer     I connected with Ms. Rohner on 02/15/2020 at 10:50 AM EDT by MyChart video conference and verified that I am speaking with the correct person using two identifiers.    Patient location: home Provider location: [place]  HISTORY OF PRESENT ILLNESS:   Ms. Feazell, a 54 y.o. female, was seen for a [facility] cancer genetics consultation at the request of Dr. Lindi Adie due to a personal and family history of cancer.  Ms. Ryther presents to clinic today to discuss the possibility of a hereditary predisposition to cancer, genetic testing, and to further clarify her future cancer risks, as well as potential cancer risks for family members.   In 2021, at the age of 54, Ms. Spake was diagnosed with invasive mammary carcinoma of the right breast, triple negative. The treatment plan includes neoadjuvant chemotherapy, surgery and adjuvant radiation.    CANCER HISTORY:  Oncology History  Malignant neoplasm of upper-outer quadrant of right breast in female, estrogen receptor negative ([place])  02/02/2020 Initial Diagnosis   Right breast lump tenderness. Diagnostic mammogram and [place] on 02/01/20 showed a 2.0cm mass at the 10 o'clock position with no axillary adenopathy. 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, ER/PR negative, Ki67 80%   02/08/2020 Cancer Staging   Staging form: Breast, AJCC 8th Edition - Clinical stage from 02/08/2020: Stage IB (cT1c, cN0, cM0, G3, ER-, PR-, HER2-) - Signed by Nicholas Lose, MD on 02/08/2020   02/16/2020 -  Chemotherapy   The patient had DOXOrubicin  (ADRIAMYCIN) chemo injection 98 mg, 60 mg/m2 = 98 mg, Intravenous,  Once, 0 of 4 cycles palonosetron (ALOXI) injection 0.25 mg, 0.25 mg, Intravenous,  Once, 0 of 8 cycles pegfilgrastim (NEULASTA ONPRO KIT) injection 6 mg, 6 mg, Subcutaneous, Once, 0 of 4 cycles CARBOplatin (PARAPLATIN) 700 mg in sodium chloride 0.9 % 250 mL chemo infusion, 700 mg (100 % of original dose 700 mg), Intravenous,  Once, 0 of 4 cycles Dose modification: 700 mg (original dose 700 mg, Cycle 5) cyclophosphamide (CYTOXAN) 980 mg in sodium chloride 0.9 % 250 mL chemo infusion, 600 mg/m2 = 980 mg, Intravenous,  Once, 0 of 4 cycles PACLitaxel (TAXOL) 132 mg in sodium chloride 0.9 % 250 mL chemo infusion (</= 22m/m2), 80 mg/m2 = 132 mg, Intravenous,  Once, 0 of 4 cycles fosaprepitant (EMEND) 150 mg in sodium chloride 0.9 % 145 mL IVPB, 150 mg, Intravenous,  Once, 0 of 8 cycles  for chemotherapy treatment.       RISK FACTORS:  Menarche was at age 54  First live birth at age 54  OCP use for approximately 10 years.  Ovaries intact: yes.  Hysterectomy: no.  Menopausal status: perimenopausal.  HRT use: 0 years. Colonoscopy: yes; normal. Mammogram within the last year: yes. Number of breast biopsies: 1. Up to date with pelvic exams: yes. Any excessive radiation exposure in the past: no  Past Medical History:  Diagnosis Date  . Anorexia   . Anxiety   . Arthritis    knees  . Family history of breast cancer   . Family history of lung cancer   .  Family history of pancreatic cancer   . Fibroids   . Wears glasses     Past Surgical History:  Procedure Laterality Date  . CLEFT PALATE REPAIR    . CLEFT PALATE REPAIR  1970  . COLONOSCOPY WITH PROPOFOL N/A 04/25/2018   Procedure: COLONOSCOPY WITH PROPOFOL;  Surgeon: [name], MD;  Location: [facility];  Service: Endoscopy;  Laterality: N/A;    Social History   Socioeconomic History  . Marital status: Married    Spouse name: Luretha Rued  .  Number of children: 2  . Years of education: 16  . Highest education level: Bachelor's degree (e.g., BA, AB, BS)  Occupational History  . Occupation: works on point of care machines  Tobacco Use  . Smoking status: Never Smoker  . Smokeless tobacco: Never Used  Substance and Sexual Activity  . Alcohol use: No    Alcohol/week: 0.0 standard drinks  . Drug use: No  . Sexual activity: Yes    Partners: Male    Birth control/protection: Surgical    Comment: husband s/p vasectomy  Other Topics Concern  . Not on file  Social History Narrative  . Not on file   Social Determinants of Health   Financial Resource Strain:   . Difficulty of Paying Living Expenses:   Food Insecurity:   . Worried About Charity fundraiser in the Last Year:   . Arboriculturist in the Last Year:   Transportation Needs:   . Film/video editor (Medical):   Marland Kitchen Lack of Transportation (Non-Medical):   Physical Activity:   . Days of Exercise per Week:   . Minutes of Exercise per Session:   Stress:   . Feeling of Stress :   Social Connections:   . Frequency of Communication with Friends and Family:   . Frequency of Social Gatherings with Friends and Family:   . Attends Religious Services:   . Active Member of Clubs or Organizations:   . Attends Archivist Meetings:   Marland Kitchen Marital Status:      FAMILY HISTORY:  We obtained a detailed, 4-generation family history.  Significant diagnoses are listed below: Family History  Problem Relation Age of Onset  . Anxiety disorder Mother   . Heart disease Father 10       CABG x4  . Diabetes Father   . Hypertension Father   . Hypothyroidism Father   . Anxiety disorder Daughter   . Heart disease Maternal Grandmother   . Stroke Maternal Grandmother 68  . Heart disease Paternal Aunt   . Stroke Paternal Aunt   . Heart disease Paternal Uncle   . Pancreatic cancer Paternal Uncle        dx 55s  . Hypertension Sister   . Hyperlipidemia Sister   .  Hypothyroidism Sister   . Lung cancer Maternal Uncle   . Healthy Son   . Breast cancer Cousin   . Lung cancer Paternal Aunt   . Colon cancer Neg Hx   . Ovarian cancer Neg Hx   . Cervical cancer Neg Hx    Ms. Ritter has a daughter and a son. She has 2 sisters, no cancer history.  Ms. Roes mother is living at 7 with no cancer history. Patient had 2 maternal uncles and 2 maternal aunts. Both uncles died of lung cancer in their 70s, both had history of smoking. No known cancers in maternal cousins. Maternal grandmother died of a stroke at 21, grandfather died in  his 29s of a heart attack.  Ms. Wieseler father is living at 45, no cancer history. Patient has 5 paternal aunts and 4 paternal uncles. One uncle had pancreatic cancer in his 50s and died in his 25s. An aunt had lung cancer and history of smoking. A paternal cousin had breast cancer in her 82s. Paternal grandmother died at 31, grandfather died in his 40s.   Ms. Gosa is unaware of previous family history of genetic testing for hereditary cancer risks. Patient's maternal ancestors are of unknown descent, and paternal ancestors are of unknown descent. There is no reported Ashkenazi Jewish ancestry. There is no known consanguinity.  GENETIC COUNSELING ASSESSMENT: Ms. Omura is a 54 y.o. female with a personal history of breast and family history of breast and pancreatic cancer which is somewhat suggestive of a hereditary cancer syndrome such as HBOC and predisposition to cancer. We, therefore, discussed and recommended the following at today's visit.   DISCUSSION: We discussed that 5 - 10% of breast cancer is hereditary, with most cases associated with BRCA1/BRCA2 mutations. Pancreatic cancer is also associated with these genes.  There are other genes that can be associated with hereditary breast and hereditary pancreatic cancer syndromes.  We discussed that testing is beneficial for several reasons including surgical  decision-making for breast cancer, knowing how to follow individuals after completing their treatment, and understand if other family members could be at risk for cancer and allow them to undergo genetic testing.   We reviewed the characteristics, features and inheritance patterns of hereditary cancer syndromes. We also discussed genetic testing, including the appropriate family members to test, the process of testing, insurance coverage and turn-around-time for results. We discussed the implications of a negative, positive and/or variant of uncertain significant result. We recommended Ms. Eisel pursue genetic testing for the [facility] Common Hereditary Cancers gene panel.   The Common Hereditary Cancers Panel offered by [facility] includes sequencing and/or deletion duplication testing of the following 48 genes: APC, ATM, AXIN2, BARD1, BMPR1A, BRCA1, BRCA2, BRIP1, CDH1, CDKN2A (p14ARF), CDKN2A (p16INK4a), CKD4, CHEK2, CTNNA1, DICER1, EPCAM (Deletion/duplication testing only), GREM1 (promoter region deletion/duplication testing only), KIT, MEN1, MLH1, MSH2, MSH3, MSH6, MUTYH, NBN, NF1, NHTL1, PALB2, PDGFRA, PMS2, POLD1, POLE, PTEN, RAD50, RAD51C, RAD51D, RNF43, SDHB, SDHC, SDHD, SMAD4, SMARCA4. STK11, TP53, TSC1, TSC2, and VHL.  The following genes were evaluated for sequence changes only: SDHA and HOXB13 c.251G>A variant only.  Based on Ms. Morneault's personal and family history of cancer, she meets medical criteria for genetic testing. Despite that she meets criteria, she may still have an out of pocket cost.   PLAN: After considering the risks, benefits, and limitations, Ms. Schauf provided informed consent to pursue genetic testing and the blood sample was sent to [facility] for analysis of the Common Hereditary Cancers Panel. Results should be available within approximately 2-3 weeks' time, at which point they will be disclosed by telephone to Ms. Alanis, as will any additional  recommendations warranted by these results. Ms. Dacquisto will receive a summary of her genetic counseling visit and a copy of her results once available. This information will also be available in Epic.   Based on Ms. Stay's family history, we recommended her paternal relatives have genetic counseling and testing. Ms. Minella will let us know if we can be of any assistance in coordinating genetic counseling and/or testing for this family member.   Lastly, we encouraged Ms. Murdy to remain in contact with cancer genetics annually so that we can continuously update  the family history and inform her of any changes in cancer genetics and testing that may be of benefit for this family.   Ms. Pinkett questions were answered to her satisfaction today. Our contact information was provided should additional questions or concerns arise. Thank you for the referral and allowing [place] to share in the care of your patient.   Faith Rogue, MS, [facility] Genetic Counselor [email or web address] Phone: [phone]  The patient was seen for a total of 25 minutes in face-to-face genetic counseling.  Drs. Magrinat, Lindi Adie and/or Burr Medico were available for discussion regarding this case.   _______________________________________________________________________ For Office Staff:  Number of people involved in session: 1 Was an Intern/ student involved with case: no

## 2020-02-15 NOTE — Patient Instructions (Signed)
DUE TO COVID-19 ONLY ONE VISITOR IS ALLOWED TO COME WITH YOU AND STAY IN THE WAITING ROOM ONLY DURING PRE OP AND PROCEDURE DAY OF SURGERY. THE 1 VISITOR MAY VISIT WITH YOU AFTER SURGERY IN YOUR PRIVATE ROOM DURING VISITING HOURS ONLY!  YOU NEED TO HAVE A COVID 19 TEST ON_4/5/21______ @__2 :50pm_____, THIS TEST MUST BE DONE BEFORE SURGERY, COME  [address] [place] , [id number].  ([place]) ONCE YOUR COVID TEST IS COMPLETED, PLEASE BEGIN THE QUARANTINE INSTRUCTIONS AS OUTLINED IN YOUR HANDOUT.                Rosalene Billings   Your procedure is scheduled on: 02/22/20   Report to [facility] Main  Entrance   Report to  Short Stay 5:30 AM. See map     Call this number if you have problems the morning of surgery [phone]    Remember: Do not eat food or drink liquids after Midnight.   BRUSH YOUR TEETH MORNING OF SURGERY AND RINSE YOUR MOUTH OUT, NO CHEWING GUM CANDY OR MINTS.     Take these medicines the morning of surgery with A SIP OF WATER:  Lexapro                                 You may not have any metal on your body including hair pins and              piercings  Do not wear jewelry, make-up, lotions, powders or perfumes, deodorant             Do not wear nail polish on your fingernails.  Do not shave  48 hours prior to surgery.     Do not bring valuables to the hospital. [place].  Contacts, dentures or bridgework may not be worn into surgery.       Patients discharged the day of surgery will not be allowed to drive home.   IF YOU ARE HAVING SURGERY AND GOING HOME THE SAME DAY, YOU MUST HAVE AN ADULT TO DRIVE YOU HOME AND BE WITH YOU FOR 24 HOURS.   YOU MAY GO HOME BY TAXI OR UBER OR ORTHERWISE, BUT AN ADULT MUST ACCOMPANY YOU HOME AND STAY WITH YOU FOR 24 HOURS.  Name and phone number of your driver:  Special Instructions: N/A              Please read over the following fact sheets  you were given: _____________________________________________________________________             [facility] - Preparing for Surgery Before surgery, you can play an important role .  Because skin is not sterile, your skin needs to be as free of germs as  .  You can reduce the number of germs on your skin by washing with CHG (chlorahexidine gluconate) soap before surgery.   CHG is an antiseptic cleaner which kills germs and bonds with the skin to continue killing germs even after washing. Please DO NOT use if you have an allergy to CHG or antibacterial soaps.   If your skin becomes reddened/irritated stop using the CHG and inform your nurse when you arrive at Short Stay. Do not shave (including legs and underarms) for at least 48 hours prior to the first CHG shower.  Please follow these instructions carefully:  1.  Shower with CHG Soap the night before surgery and the  morning of Surgery.  2.  If you choose to wash your hair, wash your hair first as usual with your  normal  shampoo.  3.  After you shampoo, rinse your hair and body thoroughly to remove the  shampoo.                                        4.  Use CHG as you would any other liquid soap.  You can apply chg directly  to the skin and wash                       Gently with a scrungie or clean washcloth.  5.  Apply the CHG Soap to your body ONLY FROM THE NECK DOWN.   Do not use on face/ open                           Wound or open sores. Avoid contact with eyes, ears mouth and genitals (private parts).                       Wash face,  Genitals (private parts) with your normal soap.             6.  Wash thoroughly, paying special attention to the area where your surgery  will be performed.  7.  Thoroughly rinse your body with warm water from the neck down.  8.  DO NOT shower/wash with your normal soap after using and rinsing off  the CHG Soap.             9.  Pat yourself dry with a clean towel.            10.  Wear clean  pajamas.            11.  Place clean sheets on your bed the night of your first shower and do not  sleep with pets. Day of Surgery : Do not apply any lotions/deodorants the morning of surgery.  Please wear clean clothes to the hospital/surgery center.  FAILURE TO FOLLOW THESE INSTRUCTIONS MAY RESULT IN THE CANCELLATION OF YOUR SURGERY PATIENT SIGNATURE_________________________________  NURSE SIGNATURE__________________________________  ________________________________________________________________________

## 2020-02-15 NOTE — Telephone Encounter (Signed)
Called pt to discuss CT cap results. Received verbal understanding. Informed pt will call with decision on further MR bx results as soon as I'm given the decision. Denies further questions at this time.

## 2020-02-16 ENCOUNTER — Ambulatory Visit (HOSPITAL_COMMUNITY)
Admission: RE | Admit: 2020-02-16 | Discharge: 2020-02-16 | Disposition: A | Discharge status: Home / Self Care | Payer: 59 | Source: Ambulatory Visit | Attending: Hematology and Oncology | Admitting: Hematology and Oncology

## 2020-02-16 ENCOUNTER — Encounter (HOSPITAL_COMMUNITY): Payer: 59

## 2020-02-16 ENCOUNTER — Encounter: Payer: Self-pay | Admitting: *Deleted

## 2020-02-16 ENCOUNTER — Encounter (HOSPITAL_COMMUNITY): Payer: Self-pay

## 2020-02-16 ENCOUNTER — Encounter (HOSPITAL_COMMUNITY): Admission: RE | Admit: 2020-02-16 | Payer: 59 | Source: Ambulatory Visit

## 2020-02-16 DIAGNOSIS — C50919 Malignant neoplasm of unspecified site of unspecified female breast: Secondary | ICD-10-CM | POA: Diagnosis not present

## 2020-02-16 DIAGNOSIS — Z171 Estrogen receptor negative status [ER-]: Secondary | ICD-10-CM | POA: Insufficient documentation

## 2020-02-16 DIAGNOSIS — C50411 Malignant neoplasm of upper-outer quadrant of right female breast: Secondary | ICD-10-CM | POA: Insufficient documentation

## 2020-02-16 IMAGING — NM NM BONE WHOLE BODY
2 series · 2 of 2 positions shown · non-contrast
Comparison: None.

CLINICAL DATA: Breast carcinoma

EXAM:
NUCLEAR MEDICINE WHOLE BODY BONE SCAN
TECHNIQUE: Whole body anterior and posterior images were obtained approximately
3 hours after intravenous injection of radiopharmaceutical.
RADIOPHARMACEUTICALS:  21.6 mCi [IZ] MDP IV

[Series 1: wbr_bone_40 whole body · 2.66mm/px · 1 of 1 slices shown (1 of 2)]
[im 1/1]
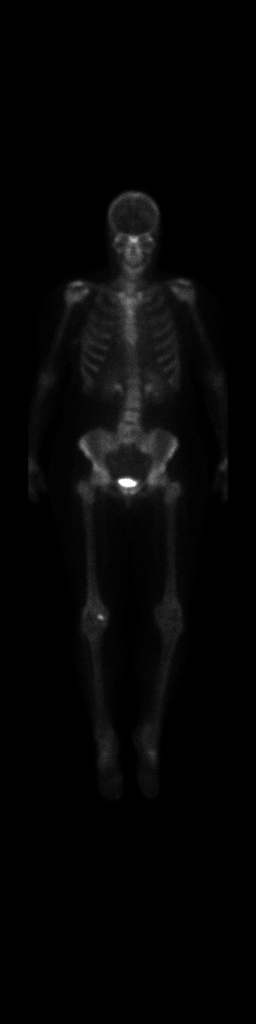

[Series 1: wbr_bone_40 whole body · 2.66mm/px · 1 of 1 slices shown (2 of 2)]
[im 1/1]
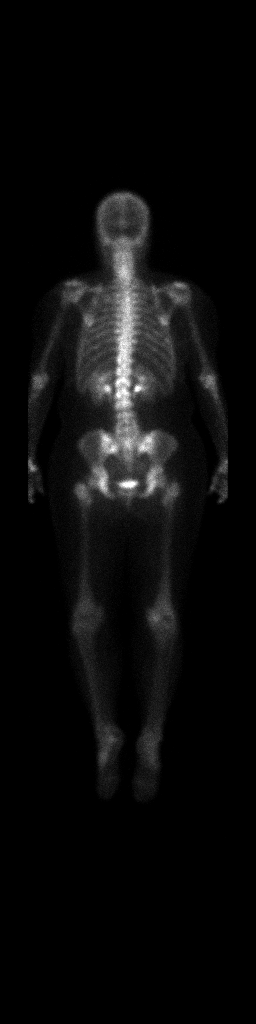

[2 of 2 positions shown; findings below may reference images not displayed]

FINDINGS: Slight increased uptake in the right knee is likely of arthropathic
etiology. There are no areas of abnormal radiotracer uptake
suggesting bony metastatic disease. Kidneys are noted in the flank
positions bilaterally.
IMPRESSION: No evidence suggesting bony metastatic disease.

## 2020-02-16 MED ORDER — TECHNETIUM TC 99M MEDRONATE IV KIT
20.0000 | PACK | Freq: Once | INTRAVENOUS | Status: AC | PRN
  Administered 2020-02-16: 21.6 via INTRAVENOUS

## 2020-02-16 NOTE — Progress Notes (Signed)
  Echocardiogram 2D Echocardiogram has been performed.  Darlina Sicilian M 02/16/2020, 10:32 AM

## 2020-02-16 NOTE — Progress Notes (Signed)
Called pt and discussed bone scan results- negative for metastatic dz.  Denies further questions at this time. Informed pt will call with decision on need for MR bx when Dr. Brantley Stage returns to office. Received verbal understanding.

## 2020-02-19 ENCOUNTER — Encounter (HOSPITAL_COMMUNITY)
Admission: RE | Admit: 2020-02-19 | Discharge: 2020-02-19 | Disposition: A | Discharge status: Home / Self Care | Payer: 59 | Source: Ambulatory Visit | Attending: Surgery | Admitting: Surgery

## 2020-02-19 ENCOUNTER — Encounter: Payer: Self-pay | Admitting: *Deleted

## 2020-02-19 ENCOUNTER — Encounter (HOSPITAL_COMMUNITY): Payer: Self-pay

## 2020-02-19 ENCOUNTER — Other Ambulatory Visit: Payer: Self-pay

## 2020-02-19 ENCOUNTER — Other Ambulatory Visit (HOSPITAL_COMMUNITY)
Admission: RE | Admit: 2020-02-19 | Discharge: 2020-02-19 | Disposition: A | Discharge status: Home / Self Care | Payer: 59 | Source: Ambulatory Visit | Attending: Surgery | Admitting: Surgery

## 2020-02-19 DIAGNOSIS — Z20822 Contact with and (suspected) exposure to covid-19: Secondary | ICD-10-CM | POA: Insufficient documentation

## 2020-02-19 DIAGNOSIS — Z01812 Encounter for preprocedural laboratory examination: Secondary | ICD-10-CM | POA: Diagnosis not present

## 2020-02-19 LAB — COMPREHENSIVE METABOLIC PANEL
ALT: 11 U/L (ref 0–44)
AST: 15 U/L (ref 15–41)
Albumin: 4 g/dL (ref 3.5–5.0)
Alkaline Phosphatase: 52 U/L (ref 38–126)
Anion gap: 5 (ref 5–15)
BUN: 8 mg/dL (ref 6–20)
CO2: 28 mmol/L (ref 22–32)
Calcium: 9.1 mg/dL (ref 8.9–10.3)
Chloride: 108 mmol/L (ref 98–111)
Creatinine, Ser: 0.61 mg/dL (ref 0.44–1.00)
GFR calc Af Amer: >60 mL/min (ref >60)
GFR calc non Af Amer: >60 mL/min (ref >60)
Glucose, Bld: 84 mg/dL (ref 70–99)
Potassium: 3.9 mmol/L (ref 3.5–5.1)
Sodium: 141 mmol/L (ref 135–145)
Total Bilirubin: 0.5 mg/dL (ref 0.3–1.2)
Total Protein: 6.8 g/dL (ref 6.5–8.1)

## 2020-02-19 LAB — CBC WITH DIFFERENTIAL/PLATELET
Abs Immature Granulocytes: 0.03 10*3/uL (ref 0.00–0.07)
Basophils Absolute: 0 10*3/uL (ref 0.0–0.1)
Basophils Relative: 1 %
Eosinophils Absolute: 0.2 10*3/uL (ref 0.0–0.5)
Eosinophils Relative: 2 %
HCT: 36.5 % (ref 36.0–46.0)
Hemoglobin: 11.7 g/dL — ABNORMAL LOW (ref 12.0–15.0)
Immature Granulocytes: 0 %
Lymphocytes Relative: 35 %
Lymphs Abs: 2.5 10*3/uL (ref 0.7–4.0)
MCH: 28.6 pg (ref 26.0–34.0)
MCHC: 32.1 g/dL (ref 30.0–36.0)
MCV: 89.2 fL (ref 80.0–100.0)
Monocytes Absolute: 0.6 10*3/uL (ref 0.1–1.0)
Monocytes Relative: 8 %
Neutro Abs: 3.9 10*3/uL (ref 1.7–7.7)
Neutrophils Relative %: 54 %
Platelets: 177 10*3/uL (ref 150–400)
RBC: 4.09 MIL/uL (ref 3.87–5.11)
RDW: 12.5 % (ref 11.5–15.5)
WBC: 7.3 10*3/uL (ref 4.0–10.5)
nRBC: 0 % (ref 0.0–0.2)

## 2020-02-19 LAB — SARS CORONAVIRUS 2 (TAT 6-24 HRS): SARS Coronavirus 2: NEGATIVE

## 2020-02-19 NOTE — Progress Notes (Signed)
PCP -Dr. Arrie Senate  Cardiologist - no  Chest x-ray - 02/14/20 EKG - no Stress Test -no  ECHO - 02/08/20 Cardiac Cath - no  Sleep Study - no CPAP -   Fasting Blood Sugar - NA Checks Blood Sugar _____ times a day  Blood Thinner Instructions:NA Aspirin Instructions: Last Dose:  Anesthesia review:   Patient denies shortness of breath, fever, cough and chest pain at PAT appointment yes  Patient verbalized understanding of instructions that were given to them at the PAT appointment. Patient was also instructed that they will need to review over the PAT instructions again at home before surgery. yes

## 2020-02-19 NOTE — Progress Notes (Signed)
Pt return call. Discussed recommendations from Dr. Brantley Stage of not needing further MR bx d/t pt wanting mastectomy. Pt relate she will likely want reconstruction. Informed pt we will send a referral half way through chemo. Received verbal understanding. Denies further questions at this time.

## 2020-02-19 NOTE — Progress Notes (Signed)
Left vm for pt informing per Dr. Brantley Stage if mastectomy is desired then MR bx is not needed and if reconstruction is wanted to send to plastics for discussion. Informed pt to call back with decision on reconstruction.

## 2020-02-19 NOTE — Progress Notes (Signed)
Pharmacist Chemotherapy Monitoring - Initial Assessment    Anticipated start date: 02/23/20   Regimen:  . Are orders appropriate based on the patient's diagnosis, regimen, and cycle? Yes . Does the plan date match the patient's scheduled date? Yes . Is the sequencing of drugs appropriate? Yes . Are the premedications appropriate for the patient's regimen? Yes . Prior Authorization for treatment is: Approved o If applicable, is the correct biosimilar selected based on the patient's insurance? not applicable  Organ Function and Labs: Marland Kitchen Are dose adjustments needed based on the patient's renal function, hepatic function, or hematologic function? No . Are appropriate labs ordered prior to the start of patient's treatment? Yes . Other organ system assessment, if indicated: anthracyclines: Echo/ MUGA . The following baseline labs, if indicated, have been ordered: N/A  Dose Assessment: . Are the drug doses appropriate? Yes . Are the following correct: o Drug concentrations Yes o IV fluid compatible with drug Yes o Administration routes Yes o Timing of therapy Yes . If applicable, does the patient have documented access for treatment and/or plans for port-a-cath placement? yes . If applicable, have lifetime cumulative doses been properly documented and assessed? not applicable Lifetime Dose Tracking  No doses have been documented on this patient for the following tracked chemicals: Doxorubicin, Epirubicin, Idarubicin, Daunorubicin, Mitoxantrone, Bleomycin, Oxaliplatin, Carboplatin, Liposomal Doxorubicin  o   Toxicity Monitoring/Prevention: . The patient has the following take home antiemetics prescribed: Ondansetron, Prochlorperazine, Dexamethasone and Lorazepam . The patient has the following take home medications prescribed: N/A . Medication allergies and previous infusion related reactions, if applicable, have been reviewed and addressed. Yes . The patient's current medication list has  been assessed for drug-drug interactions with their chemotherapy regimen. no significant drug-drug interactions were identified on review.  Order Review: . Are the treatment plan orders signed? Yes . Is the patient scheduled to see a provider prior to their treatment? Yes  I verify that I have reviewed each item in the above checklist and answered each question accordingly.  Marijke Guadiana K 02/19/2020 1:04 PM

## 2020-02-20 ENCOUNTER — Other Ambulatory Visit: Payer: Self-pay | Admitting: Hematology and Oncology

## 2020-02-20 ENCOUNTER — Ambulatory Visit
Admission: RE | Admit: 2020-02-20 | Discharge: 2020-02-20 | Disposition: A | Discharge status: Home / Self Care | Payer: 59 | Source: Ambulatory Visit | Attending: Hematology and Oncology | Admitting: Hematology and Oncology

## 2020-02-20 DIAGNOSIS — Z171 Estrogen receptor negative status [ER-]: Secondary | ICD-10-CM

## 2020-02-20 DIAGNOSIS — C50411 Malignant neoplasm of upper-outer quadrant of right female breast: Secondary | ICD-10-CM

## 2020-02-20 DIAGNOSIS — C50911 Malignant neoplasm of unspecified site of right female breast: Secondary | ICD-10-CM | POA: Diagnosis not present

## 2020-02-20 IMAGING — US US AXILLARY RIGHT
1 series · 1 of 1 positions shown · non-contrast
Comparison: Previous exam(s).

CLINICAL DATA: The patient was recently diagnosed with right breast
cancer. A recent MRI showed a mildly abnormal node in the low right
axilla. Three masses were also seen in the right breast in addition
to the patient's known malignancy. The patient is here for right
axillary and low right axillary ultrasound.

EXAM:
ULTRASOUND OF THE RIGHT BREAST

[Series 1: us axillary right · 0.07mm/px · 1 of 1 slices shown]
[im 1/1]
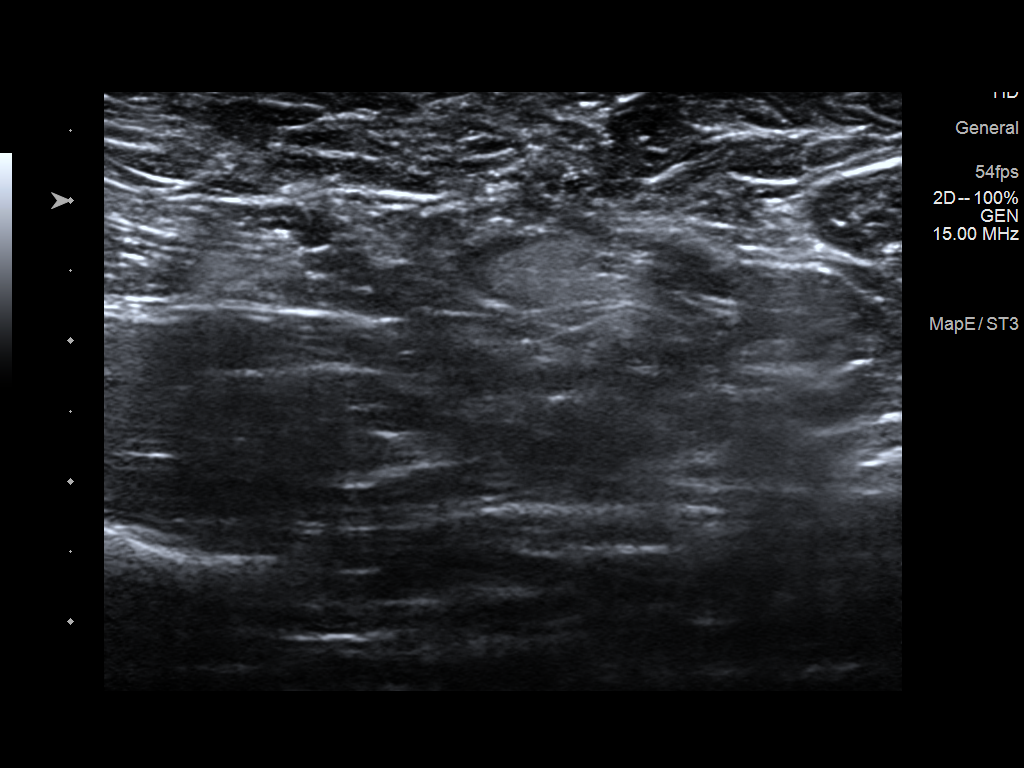

[1 of 1 positions shown; findings below may reference images not displayed]

FINDINGS: On physical exam, no suspicious lumps are identified.

Targeted ultrasound is performed, showing no abnormal nodes by
ultrasound. The patient's known malignancy is seen in the upper
outer right breast.
IMPRESSION: No abnormal lymph nodes identified in the right axilla, low right
axilla, or upper outer quadrant of the right breast. The patient's
known malignancy at 10 o'clock in the right breast was identified.

RECOMMENDATION:
Recommend sentinel lymph node biopsy at surgery. Recommend continued
surgical and oncologic follow up. Recommend MRI guided biopsy of 2
additional right breast masses if the patient is seeking breast
conservation surgery.

I have discussed the findings and recommendations with the patient.
If applicable, a reminder letter will be sent to the patient
regarding the next appointment.

BI-RADS CATEGORY  6: Known biopsy-proven malignancy.

## 2020-02-22 ENCOUNTER — Ambulatory Visit (HOSPITAL_COMMUNITY): Payer: 59

## 2020-02-22 ENCOUNTER — Encounter (HOSPITAL_COMMUNITY): Payer: Self-pay | Admitting: Surgery

## 2020-02-22 ENCOUNTER — Other Ambulatory Visit: Payer: Self-pay

## 2020-02-22 ENCOUNTER — Encounter (HOSPITAL_COMMUNITY)
Admission: RE | Disposition: A | Discharge status: Home / Self Care | Payer: Self-pay | Source: Home / Self Care | Attending: Surgery

## 2020-02-22 ENCOUNTER — Encounter: Payer: Self-pay | Admitting: *Deleted

## 2020-02-22 ENCOUNTER — Ambulatory Visit (HOSPITAL_COMMUNITY): Payer: 59 | Admitting: Certified Registered"

## 2020-02-22 ENCOUNTER — Ambulatory Visit (HOSPITAL_COMMUNITY)
Admission: RE | Admit: 2020-02-22 | Discharge: 2020-02-22 | Disposition: A | Discharge status: Home / Self Care | Payer: 59 | Attending: Surgery | Admitting: Surgery

## 2020-02-22 DIAGNOSIS — C50411 Malignant neoplasm of upper-outer quadrant of right female breast: Secondary | ICD-10-CM | POA: Diagnosis not present

## 2020-02-22 DIAGNOSIS — C50911 Malignant neoplasm of unspecified site of right female breast: Secondary | ICD-10-CM | POA: Diagnosis not present

## 2020-02-22 DIAGNOSIS — D219 Benign neoplasm of connective and other soft tissue, unspecified: Secondary | ICD-10-CM | POA: Diagnosis not present

## 2020-02-22 DIAGNOSIS — F419 Anxiety disorder, unspecified: Secondary | ICD-10-CM | POA: Insufficient documentation

## 2020-02-22 DIAGNOSIS — Z171 Estrogen receptor negative status [ER-]: Secondary | ICD-10-CM | POA: Diagnosis not present

## 2020-02-22 DIAGNOSIS — Z95828 Presence of other vascular implants and grafts: Secondary | ICD-10-CM

## 2020-02-22 DIAGNOSIS — D490 Neoplasm of unspecified behavior of digestive system: Secondary | ICD-10-CM | POA: Diagnosis not present

## 2020-02-22 DIAGNOSIS — Z452 Encounter for adjustment and management of vascular access device: Secondary | ICD-10-CM | POA: Diagnosis not present

## 2020-02-22 DIAGNOSIS — J439 Emphysema, unspecified: Secondary | ICD-10-CM | POA: Diagnosis not present

## 2020-02-22 HISTORY — PX: PORTACATH PLACEMENT: SHX2246

## 2020-02-22 LAB — PREGNANCY, URINE: Preg Test, Ur: NEGATIVE

## 2020-02-22 IMAGING — DX DG CHEST 1V PORT
1 series · 1 of 1 positions shown · non-contrast
Comparison: [DATE]

CLINICAL DATA: Port placement

EXAM:
PORTABLE CHEST 1 VIEW

[chest ap]
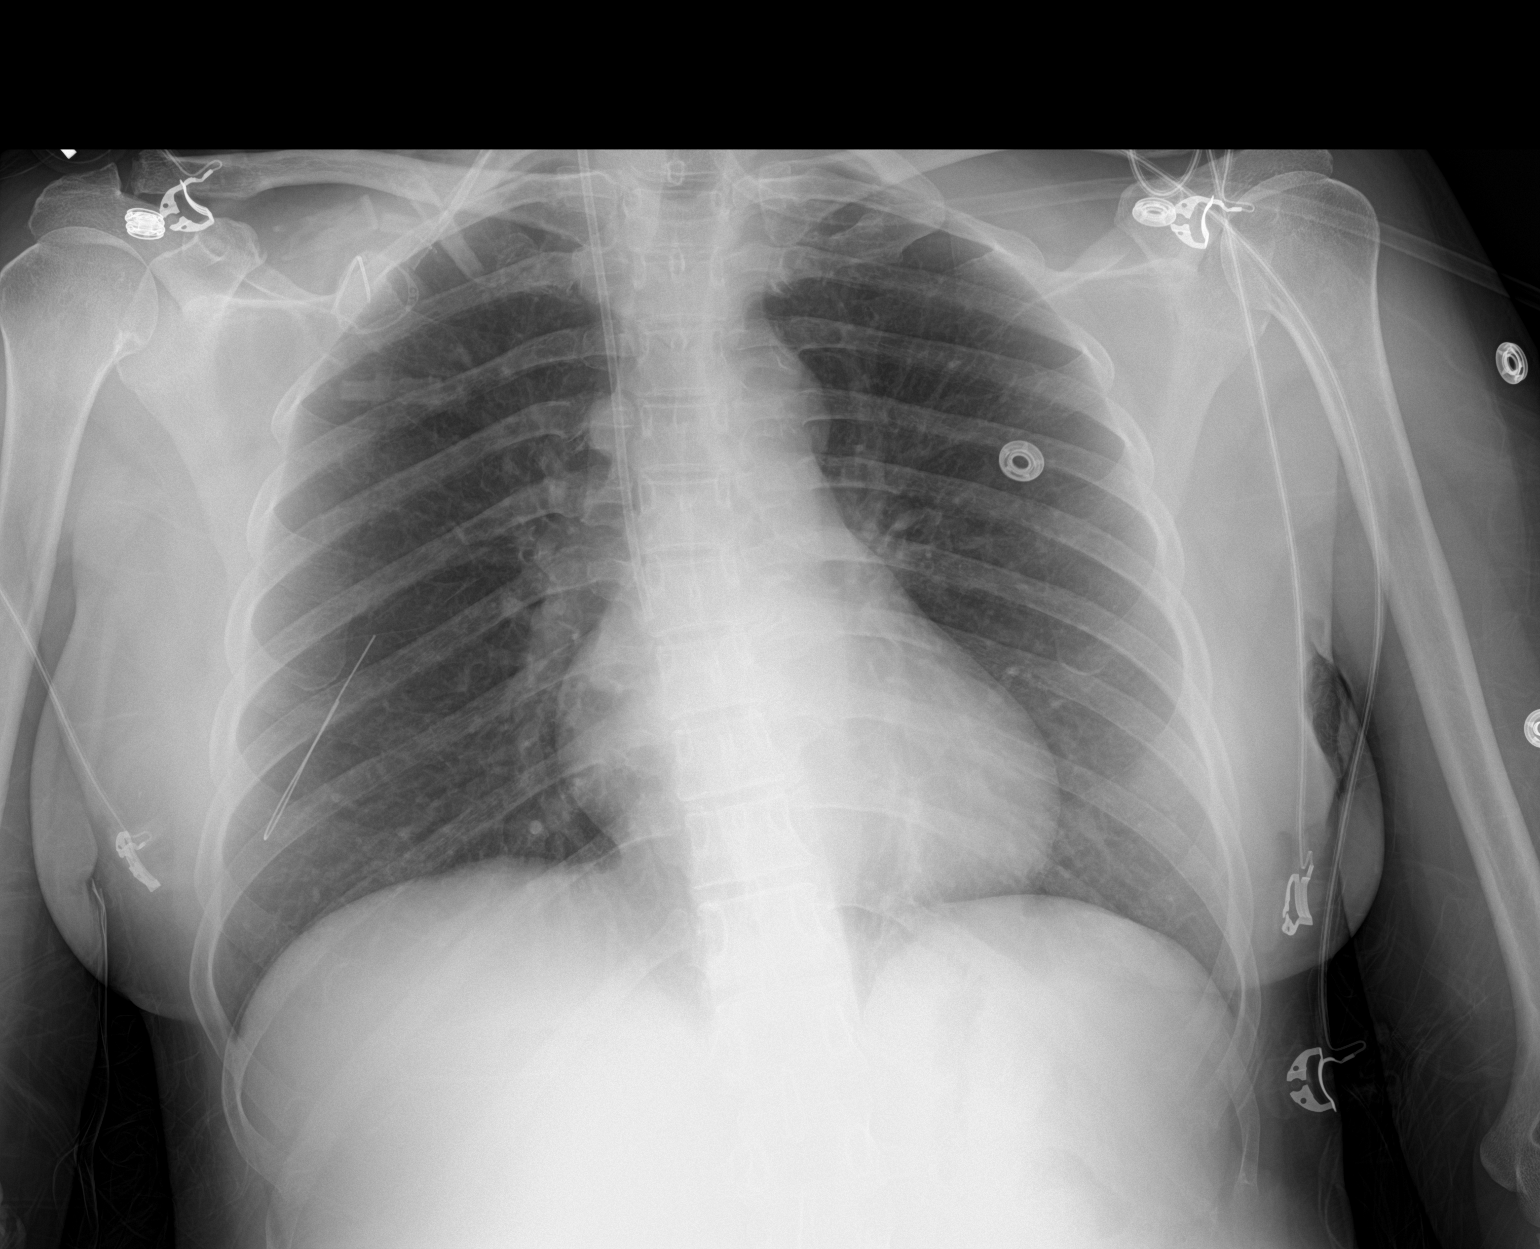

[1 of 1 positions shown; findings below may reference images not displayed]

FINDINGS: Interval placement of right IJ approach Port-A-Cath with distal tip
terminating at the superior cavoatrial junction. The heart size and
mediastinal contours are within normal limits. No focal airspace
consolidation, pleural effusion, or pneumothorax. The visualized
skeletal structures are unremarkable. Subcutaneous emphysema over
the superior aspect of the right chest wall. Linear wire projects
over the lateral right hemithorax, likely external to the patient.
IMPRESSION: 1. Interval placement of right IJ approach Port-A-Cath with distal
tip terminating at the superior cavoatrial junction. No
pneumothorax.
2. Subcutaneous emphysema over the superior aspect of the right
chest wall.

## 2020-02-22 SURGERY — INSERTION, TUNNELED CENTRAL VENOUS DEVICE, WITH PORT
Anesthesia: General | Site: Chest | Laterality: Right

## 2020-02-22 MED ORDER — PROMETHAZINE HCL 25 MG/ML IJ SOLN: 6.2500 mg | INTRAMUSCULAR | Status: DC | PRN

## 2020-02-22 MED ORDER — BUPIVACAINE HCL (PF) 0.25 % IJ SOLN
INTRAMUSCULAR | Status: AC
  Filled 2020-02-22: qty 30

## 2020-02-22 MED ORDER — ONDANSETRON HCL 4 MG/2ML IJ SOLN
INTRAMUSCULAR | Status: AC
  Filled 2020-02-22: qty 2

## 2020-02-22 MED ORDER — LIDOCAINE 2% (20 MG/ML) 5 ML SYRINGE
INTRAMUSCULAR | Status: AC
  Filled 2020-02-22: qty 5

## 2020-02-22 MED ORDER — HEPARIN SOD (PORK) LOCK FLUSH 100 UNIT/ML IV SOLN
INTRAVENOUS | Status: AC
  Filled 2020-02-22: qty 5

## 2020-02-22 MED ORDER — HEPARIN SOD (PORK) LOCK FLUSH 100 UNIT/ML IV SOLN
INTRAVENOUS | Status: DC | PRN
  Administered 2020-02-22: 500 [IU] via INTRAVENOUS

## 2020-02-22 MED ORDER — FENTANYL CITRATE (PF) 100 MCG/2ML IJ SOLN: 25.0000 ug | INTRAMUSCULAR | Status: DC | PRN

## 2020-02-22 MED ORDER — MIDAZOLAM HCL 2 MG/2ML IJ SOLN
INTRAMUSCULAR | Status: AC
  Filled 2020-02-22: qty 2

## 2020-02-22 MED ORDER — ONDANSETRON HCL 4 MG/2ML IJ SOLN
INTRAMUSCULAR | Status: DC | PRN
  Administered 2020-02-22: 4 mg via INTRAVENOUS

## 2020-02-22 MED ORDER — CHLORHEXIDINE GLUCONATE CLOTH 2 % EX PADS: 6.0000 | MEDICATED_PAD | Freq: Once | CUTANEOUS | Status: DC

## 2020-02-22 MED ORDER — SODIUM CHLORIDE 0.9 % IV SOLN
Freq: Once | INTRAVENOUS | Status: AC
  Administered 2020-02-22: 20 mL
  Filled 2020-02-22: qty 1.2

## 2020-02-22 MED ORDER — MIDAZOLAM HCL 2 MG/2ML IJ SOLN
INTRAMUSCULAR | Status: DC | PRN
  Administered 2020-02-22 (×2): 1 mg via INTRAVENOUS

## 2020-02-22 MED ORDER — PHENYLEPHRINE 40 MCG/ML (10ML) SYRINGE FOR IV PUSH (FOR BLOOD PRESSURE SUPPORT)
PREFILLED_SYRINGE | INTRAVENOUS | Status: AC
  Filled 2020-02-22: qty 10

## 2020-02-22 MED ORDER — FENTANYL CITRATE (PF) 100 MCG/2ML IJ SOLN
INTRAMUSCULAR | Status: AC
  Filled 2020-02-22: qty 2

## 2020-02-22 MED ORDER — PROPOFOL 10 MG/ML IV BOLUS
INTRAVENOUS | Status: AC
  Filled 2020-02-22: qty 20

## 2020-02-22 MED ORDER — LACTATED RINGERS IV SOLN: INTRAVENOUS | Status: DC

## 2020-02-22 MED ORDER — LIDOCAINE 2% (20 MG/ML) 5 ML SYRINGE
INTRAMUSCULAR | Status: DC | PRN
  Administered 2020-02-22: 40 mg via INTRAVENOUS

## 2020-02-22 MED ORDER — PROPOFOL 10 MG/ML IV BOLUS
INTRAVENOUS | Status: DC | PRN
  Administered 2020-02-22: 120 mg via INTRAVENOUS

## 2020-02-22 MED ORDER — CEFAZOLIN SODIUM-DEXTROSE 2-4 GM/100ML-% IV SOLN
2.0000 g | INTRAVENOUS | Status: AC
  Administered 2020-02-22: 2 g via INTRAVENOUS
  Filled 2020-02-22: qty 100

## 2020-02-22 MED ORDER — FENTANYL CITRATE (PF) 100 MCG/2ML IJ SOLN
INTRAMUSCULAR | Status: DC | PRN
  Administered 2020-02-22: 50 ug via INTRAVENOUS

## 2020-02-22 MED ORDER — BUPIVACAINE HCL (PF) 0.25 % IJ SOLN
INTRAMUSCULAR | Status: DC | PRN
  Administered 2020-02-22: 10 mL

## 2020-02-22 MED ORDER — DEXAMETHASONE SODIUM PHOSPHATE 10 MG/ML IJ SOLN
INTRAMUSCULAR | Status: AC
  Filled 2020-02-22: qty 1

## 2020-02-22 MED ORDER — DEXAMETHASONE SODIUM PHOSPHATE 10 MG/ML IJ SOLN
INTRAMUSCULAR | Status: DC | PRN
  Administered 2020-02-22: 4 mg via INTRAVENOUS

## 2020-02-22 MED ORDER — PHENYLEPHRINE 40 MCG/ML (10ML) SYRINGE FOR IV PUSH (FOR BLOOD PRESSURE SUPPORT)
PREFILLED_SYRINGE | INTRAVENOUS | Status: DC | PRN
  Administered 2020-02-22 (×7): 40 ug via INTRAVENOUS

## 2020-02-22 MED ORDER — IBUPROFEN 800 MG PO TABS: 800.0000 mg | ORAL_TABLET | Freq: Three times a day (TID) | ORAL | 0 refills | Status: DC | PRN

## 2020-02-22 MED ORDER — 0.9 % SODIUM CHLORIDE (POUR BTL) OPTIME
TOPICAL | Status: DC | PRN
  Administered 2020-02-22: 1000 mL

## 2020-02-22 MED ORDER — OXYCODONE HCL 5 MG PO TABS: 5.0000 mg | ORAL_TABLET | Freq: Four times a day (QID) | ORAL | 0 refills | Status: DC | PRN

## 2020-02-22 MED FILL — IBUPROFEN 800 MG TABS: 800 | 10 days supply | Qty: 30 | Fill #0

## 2020-02-22 MED FILL — oxyCODONE HCL 5 MG TABS: 5 | 3 days supply | Qty: 15 | Fill #0

## 2020-02-22 SURGICAL SUPPLY — 42 items
BAG DECANTER FOR FLEXI CONT (MISCELLANEOUS) ×2 IMPLANT
BENZOIN TINCTURE PRP APPL 2/3 (GAUZE/BANDAGES/DRESSINGS) IMPLANT
BLADE HEX COATED 2.75 (ELECTRODE) ×2 IMPLANT
BLADE SURG 15 STRL LF DISP TIS (BLADE) ×1 IMPLANT
BLADE SURG 15 STRL SS (BLADE) ×1
COVER WAND RF STERILE (DRAPES) IMPLANT
DECANTER SPIKE VIAL GLASS SM (MISCELLANEOUS) ×2 IMPLANT
DERMABOND ADVANCED (GAUZE/BANDAGES/DRESSINGS) ×1
DERMABOND ADVANCED .7 DNX12 (GAUZE/BANDAGES/DRESSINGS) ×1 IMPLANT
DRAPE C-ARM 42X120 X-RAY (DRAPES) ×2 IMPLANT
DRAPE LAPAROSCOPIC ABDOMINAL (DRAPES) ×2 IMPLANT
DRSG TEGADERM 2-3/8X2-3/4 SM (GAUZE/BANDAGES/DRESSINGS) IMPLANT
DRSG TEGADERM 4X4.75 (GAUZE/BANDAGES/DRESSINGS) ×4 IMPLANT
ELECT REM PT RETURN 15FT ADLT (MISCELLANEOUS) ×2 IMPLANT
GAUZE 4X4 16PLY RFD (DISPOSABLE) ×2 IMPLANT
GAUZE SPONGE 2X2 8PLY STRL LF (GAUZE/BANDAGES/DRESSINGS) IMPLANT
GAUZE SPONGE 4X4 12PLY STRL (GAUZE/BANDAGES/DRESSINGS) ×2 IMPLANT
GLOVE BIOGEL PI IND STRL 7.0 (GLOVE) ×1 IMPLANT
GLOVE BIOGEL PI INDICATOR 7.0 (GLOVE) ×1
GLOVE ECLIPSE 8.0 STRL XLNG CF (GLOVE) ×2 IMPLANT
GLOVE INDICATOR 8.0 STRL GRN (GLOVE) ×4 IMPLANT
GOWN STRL REUS W/TWL LRG LVL3 (GOWN DISPOSABLE) ×2 IMPLANT
GOWN STRL REUS W/TWL XL LVL3 (GOWN DISPOSABLE) ×4 IMPLANT
KIT BASIN OR (CUSTOM PROCEDURE TRAY) ×2 IMPLANT
KIT PORT POWER 8FR ISP CVUE (Port) ×2 IMPLANT
KIT TURNOVER KIT A (KITS) IMPLANT
NEEDLE HYPO 25X1 1.5 SAFETY (NEEDLE) ×2 IMPLANT
PACK BASIC VI WITH GOWN DISP (CUSTOM PROCEDURE TRAY) ×2 IMPLANT
PENCIL SMOKE EVACUATOR (MISCELLANEOUS) ×2 IMPLANT
SPONGE GAUZE 2X2 STER 10/PKG (GAUZE/BANDAGES/DRESSINGS)
STRIP CLOSURE SKIN 1/2X4 (GAUZE/BANDAGES/DRESSINGS) IMPLANT
SUT MNCRL AB 4-0 PS2 18 (SUTURE) ×2 IMPLANT
SUT NOVA NAB DX-16 0-1 5-0 T12 (SUTURE) IMPLANT
SUT PROLENE 2 0 SH DA (SUTURE) ×2 IMPLANT
SUT SILK 2 0 (SUTURE)
SUT SILK 2-0 30XBRD TIE 12 (SUTURE) IMPLANT
SUT VIC AB 3-0 SH 27 (SUTURE)
SUT VIC AB 3-0 SH 27XBRD (SUTURE) IMPLANT
SYR 10ML LL (SYRINGE) ×2 IMPLANT
SYR BULB IRRIGATION 50ML (SYRINGE) IMPLANT
SYR CONTROL 10ML LL (SYRINGE) ×2 IMPLANT
TOWEL OR 17X26 10 PK STRL BLUE (TOWEL DISPOSABLE) ×2 IMPLANT

## 2020-02-22 NOTE — Op Note (Signed)
Preoperative diagnosis: PAC needed for chemotherapy  Postoperative diagnosis: Same  Procedure: Portacath Placement  C arm and U/S guidence  Surgeon: Turner Daniels, MD, FACS  Anesthesia: General and 0.25 % marcaine with epinephrine  Clinical History and Indications: The patient is getting ready to begin chemotherapy for her cancer. She  needs a Port-A-Cath for venous access. Risk of bleeding, infection,  Collapse lung,  Death,  DVT,  Organ injury,  Mediastinal injury,  Injury to heart,  Injury to blood vessels,  Nerves,  Migration of catheter,  Embolization of catheter and the need for more surgery.  Description of Procedure: I have seen the patient in the holding area and confirmed the plans for the procedure as noted above. I reviewed the risks and complications again and the patient has no further questions. She wishes to proceed.   The patient was then taken to the operating room. After satisfactory general  anesthesia had been obtained the upper chest and lower neck were prepped and draped as a sterile field. The timeout was done.  The right internal jugular vein  was entered under U/S guidance  and the guidewire threaded into the superior vena cava right atrial area under fluoroscopic guidance. An incision was then made on the anterior chest wall and a subcutaneous pocket fashioned for the port reservoir.  The port tubing was then brought through a subcutaneous tunnel from the port site to the guidewire site.  The port and catheter were attached, locked  and flushed. The catheter was measured and cut to appropriate length.The dilator and peel-away sheath were then advanced over the guidewire while monitoring this with fluoroscopy. The guidewire and dilator were removed and the tubing threaded to approximately 21 cm. The peel-away sheath was then removed. The catheter aspirated and flushed easily. Using fluoroscopy the tip was in the superior vena cava right atrial junction area. It  aspirated and flushed easily. That aspirated and flushed easily.  The reservoir was secured to the fascia with 1 sutures of 2-0 Prolene. A final check with fluoroscopy was done to make sure we had no kinks and good positioning of the tip of the catheter. Everything appeared to be okay. The catheter was aspirated, flushed with dilute heparin and then concentrated aqueous heparin.  The incision was then closed with interrupted 3-0 Vicryl, and 4-0 Monocryl subcuticular with Dermabond on the skin.  There were no operative complications. Estimated blood loss was minimal. All counts were correct. The patient tolerated the procedure well.  Turner Daniels, MD, FACS

## 2020-02-22 NOTE — Anesthesia Procedure Notes (Signed)
Procedure Name: LMA Insertion Date/Time: 02/22/2020 7:29 AM Performed by: Eben Burow, CRNA Pre-anesthesia Checklist: Patient identified, Emergency Drugs available, Suction available, Patient being monitored and Timeout performed Patient Re-evaluated:Patient Re-evaluated prior to induction Oxygen Delivery Method: Circle system utilized Preoxygenation: Pre-oxygenation with 100% oxygen Induction Type: IV induction Ventilation: Mask ventilation without difficulty LMA: LMA inserted LMA Size: 4.0 Number of attempts: 1 Tube secured with: Tape Dental Injury: Teeth and Oropharynx as per pre-operative assessment

## 2020-02-22 NOTE — Progress Notes (Signed)
Patient Care Team: Virginia Crews, MD as PCP - General (Family Medicine) Mauro Kaufmann, RN as Oncology Nurse Navigator Rockwell Germany, RN as Oncology Nurse Navigator  DIAGNOSIS:    ICD-10-CM   1. Malignant neoplasm of upper-outer quadrant of right breast in female, estrogen receptor negative ([place])  C50.411    Z17.1     SUMMARY OF ONCOLOGIC HISTORY: Oncology History  Malignant neoplasm of upper-outer quadrant of right breast in female, estrogen receptor negative ([place])  02/02/2020 Initial Diagnosis   Right breast lump tenderness. Diagnostic mammogram and [place] on 02/01/20 showed a 2.0cm mass at the 10 o'clock position with no axillary adenopathy. 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, ER/PR negative, Ki67 80%   02/08/2020 Cancer Staging   Staging form: Breast, AJCC 8th Edition - Clinical stage from 02/08/2020: Stage IB (cT1c, cN0, cM0, G3, ER-, PR-, HER2-) - Signed by Nicholas Lose, MD on 02/08/2020   02/23/2020 -  Chemotherapy   The patient had DOXOrubicin (ADRIAMYCIN) chemo injection 98 mg, 60 mg/m2 = 98 mg, Intravenous,  Once, 0 of 4 cycles palonosetron (ALOXI) injection 0.25 mg, 0.25 mg, Intravenous,  Once, 0 of 8 cycles pegfilgrastim (NEULASTA ONPRO KIT) injection 6 mg, 6 mg, Subcutaneous, Once, 0 of 4 cycles CARBOplatin (PARAPLATIN) 700 mg in sodium chloride 0.9 % 250 mL chemo infusion, 700 mg (100 % of original dose 700 mg), Intravenous,  Once, 0 of 4 cycles Dose modification: 700 mg (original dose 700 mg, Cycle 5) cyclophosphamide (CYTOXAN) 980 mg in sodium chloride 0.9 % 250 mL chemo infusion, 600 mg/m2 = 980 mg, Intravenous,  Once, 0 of 4 cycles PACLitaxel (TAXOL) 132 mg in sodium chloride 0.9 % 250 mL chemo infusion (</= 79m/m2), 80 mg/m2 = 132 mg, Intravenous,  Once, 0 of 4 cycles fosaprepitant (EMEND) 150 mg in sodium chloride 0.9 % 145 mL IVPB, 150 mg, Intravenous,  Once, 0 of 8 cycles  for chemotherapy treatment.      CHIEF COMPLIANT:  Cycle 1 Adriamycin and Cytoxan  INTERVAL HISTORY: Virginia CHATTERJEEis a 54y.o. with above-mentioned history of right breast cancer currently on neoadjuvant chemotherapy. Breast MRI on 02/14/20 showed the known lateral right breast malignancy, three indeterminate masses in the central right breast, and a single prominent right axillary lymph node. CT CAP on 02/14/20 showed no evidence of metastatic disease. Bone scan on 02/16/20 showed no evidence of osseous metastatic disease. Right axilla [place] 02[place] showed no abnormal lymph nodes. Echo on 02/16/20 showed an ejection fraction of 60-65%. Her port was placed by Dr. CBrantley Stageon 02/22/20. She presents to the clinic today for cycle 1.     ALLERGIES:  has No Known Allergies.  MEDICATIONS:  Current Outpatient Medications  Medication Sig Dispense Refill  . dexamethasone (DECADRON) 4 MG tablet Take 1 tablet day after chemo and 1 tablet 2 days after chemo with food 8 tablet 0  . escitalopram (LEXAPRO) 20 MG tablet TAKE 1 TABLET (20 MG TOTAL) BY MOUTH DAILY. 90 tablet 1  . lidocaine-prilocaine (EMLA) cream Apply to affected area once 30 g 3  . LORazepam (ATIVAN) 0.5 MG tablet Take 1 tablet (0.5 mg total) by mouth at bedtime as needed (Nausea or vomiting). 30 tablet 0  . ondansetron (ZOFRAN) 8 MG tablet Take 1 tablet (8 mg total) by mouth 2 (two) times daily as needed. Start on the third day after chemotherapy. 30 tablet 1  . prochlorperazine (COMPAZINE) 10 MG tablet Take 1 tablet (10  mg total) by mouth every 6 (six) hours as needed (Nausea or vomiting). 30 tablet 1   No current facility-administered medications for this visit.    PHYSICAL EXAMINATION: ECOG PERFORMANCE STATUS: 1 - Symptomatic but completely ambulatory  Vitals:   02/23/20 0853  BP: 128/74  Pulse: 76  Resp: 20  Temp: 99.1 F (37.3 C)  SpO2: 100%   Filed Weights   02/23/20 0853  Weight: 142 lb 4.8 oz (64.5 kg)    LABORATORY DATA:  I have reviewed the data as listed CMP Latest Ref  Rng & Units 02/23/2020 02/19/2020 04/20/2019  Glucose 70 - 99 mg/dL 79 84 93  BUN 6 - 20 mg/dL '10 8 21  ' Creatinine 0.44 - 1.00 mg/dL 0.67 0.61 0.60  Sodium 135 - 145 mmol/L 143 141 144  Potassium 3.5 - 5.1 mmol/L 4.2 3.9 4.8  Chloride 98 - 111 mmol/L 110 108 108(H)  CO2 22 - 32 mmol/L '26 28 20  ' Calcium 8.9 - 10.3 mg/dL 9.1 9.1 9.5  Total Protein 6.5 - 8.1 g/dL 6.7 6.8 6.6  Total Bilirubin 0.3 - 1.2 mg/dL 0.2(L) 0.5 0.3  Alkaline Phos 38 - 126 U/L 58 52 58  AST 15 - 41 U/L '16 15 15  ' ALT 0 - 44 U/L '17 11 14    ' Lab Results  Component Value Date   WBC 9.2 02/23/2020   HGB 11.5 (L) 02/23/2020   HCT 34.9 (L) 02/23/2020   MCV 87.3 02/23/2020   PLT 169 02/23/2020   NEUTROABS 5.4 02/23/2020    ASSESSMENT & PLAN:  Malignant neoplasm of upper-outer quadrant of right breast in female, estrogen receptor negative ([place]) 02/02/2020:Right breast lump tenderness. Diagnostic mammogram and [place] on 02/01/20 showed a 2.0cm mass at the 10 o'clock position with no axillary adenopathy. 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 Neoadjuvant chemotherapy with dose dense Adriamycin and Cytoxan x4 followed by Taxol and carboplatin  2.  Breast conserving surgery with sentinel lymph node biopsy 3. Adjuvant radiation therapy  CT CAP 02/14/2020: 1.8 cm right breast lesion, 5 mm right axillary lymph node Bone scan 02/16/2020: No evidence of metastatic disease Breast MRI 02/14/2020: 2 cm right breast cancer.  3 indeterminate 4 and 5 mm enhancing masses (recommend MRI biopsy), single prominent right axillary lymph node (second look ultrasound and biopsy recommended) ------------------------------------------------------------------------------------------------------------------------------------------------------- Current treatment: Cycle 1 dose dense Adriamycin and Cytoxan Echocardiogram 02/20/2020: EF 60 to 65% Labs  reviewed, antiemetics reviewed Chemo consent obtained, chemo education completed  Return to clinic in 1 week for toxicity check    No orders of the defined types were placed in this encounter.  The patient has a good understanding of the overall plan. she agrees with it. she will call with any problems that may develop before the next visit here.  Total time spent: 30 mins including face to face time and time spent for planning, charting and coordination of care  Nicholas Lose, MD 02/23/2020  I, Cloyde Reams Dorshimer, am acting as scribe for Dr. Nicholas Lose.  I have reviewed the above documentation for accuracy and completeness, and I agree with the above.

## 2020-02-22 NOTE — Anesthesia Postprocedure Evaluation (Signed)
Anesthesia Post Note  Patient: Virginia [name]  Procedure(s) Performed: INSERTION PORT-A-CATH WITH ULTRASOUND GUIDANCE (Right Chest)     Patient location during evaluation: PACU Anesthesia Type: General Level of consciousness: awake and alert Pain management: pain level controlled Vital Signs Assessment: post-procedure vital signs reviewed and stable Respiratory status: spontaneous breathing, nonlabored ventilation, respiratory function stable and patient connected to nasal cannula oxygen Cardiovascular status: blood pressure returned to baseline and stable Postop Assessment: no apparent nausea or vomiting Anesthetic complications: no    Last Vitals:  Vitals:   02/22/20 0900 02/22/20 0915  BP: 110/71 111/79  Pulse: 67 65  Resp: 12 12  Temp: 36.5 C   SpO2: 100% 100%    Last Pain:  Vitals:   02/22/20 0915  TempSrc:   PainSc: 0-No pain                 Latessa Tillis S

## 2020-02-22 NOTE — Transfer of Care (Signed)
Immediate Anesthesia Transfer of Care Note  Patient: Virginia [name]  Procedure(s) Performed: INSERTION PORT-A-CATH WITH ULTRASOUND GUIDANCE (Right Chest)  Patient Location: PACU  Anesthesia Type:General  Level of Consciousness: awake, alert  and oriented  Airway & Oxygen Therapy: Patient Spontanous Breathing and Patient connected to face mask oxygen  Post-op Assessment: Report given to RN and Post -op Vital signs reviewed and stable  Post vital signs: Reviewed and stable  Last Vitals:  Vitals Value Taken Time  BP    Temp    Pulse    Resp    SpO2      Last Pain:  Vitals:   02/22/20 0606  TempSrc:   PainSc: 0-No pain      Patients Stated Pain Goal: 4 ([id number])  Complications: No apparent anesthesia complications

## 2020-02-22 NOTE — Anesthesia Preprocedure Evaluation (Signed)
Anesthesia Evaluation  Patient identified by MRN, date of birth, ID band Patient awake    Reviewed: Allergy & Precautions, NPO status , Patient's Chart, lab work & pertinent test results  Airway Mallampati: II  TM Distance: >3 FB Neck ROM: Full    Dental no notable dental hx.    Pulmonary neg pulmonary ROS,    Pulmonary exam normal breath sounds clear to auscultation       Cardiovascular negative cardio ROS Normal cardiovascular exam Rhythm:Regular Rate:Normal     Neuro/Psych negative neurological ROS  negative psych ROS   GI/Hepatic negative GI ROS, Neg liver ROS,   Endo/Other  negative endocrine ROS  Renal/GU negative Renal ROS  negative genitourinary   Musculoskeletal negative musculoskeletal ROS (+)   Abdominal   Peds negative pediatric ROS (+)  Hematology negative hematology ROS (+)   Anesthesia Other Findings   Reproductive/Obstetrics negative OB ROS                             Anesthesia Physical Anesthesia Plan  ASA: II  Anesthesia Plan: General   Post-op Pain Management:    Induction: Intravenous  PONV Risk Score and Plan: 3 and Ondansetron, Dexamethasone, Treatment may vary due to age or medical condition and Midazolam  Airway Management Planned: LMA  Additional Equipment:   Intra-op Plan:   Post-operative Plan: Extubation in OR  Informed Consent: I have reviewed the patients History and Physical, chart, labs and discussed the procedure including the risks, benefits and alternatives for the proposed anesthesia with the patient or authorized representative who has indicated his/her understanding and acceptance.     Dental advisory given  Plan Discussed with: CRNA and Surgeon  Anesthesia Plan Comments:         Anesthesia Quick Evaluation  

## 2020-02-22 NOTE — Interval H&P Note (Signed)
History and Physical Interval Note:  02/22/2020 7:18 AM  Virginia [name]  has presented today for surgery, with the diagnosis of RIGHT BREAST CANCER.  The various methods of treatment have been discussed with the patient and family. After consideration of risks, benefits and other options for treatment, the patient has consented to  Procedure(s): INSERTION PORT-A-CATH WITH ULTRASOUND GUIDANCE (N/A) as a surgical intervention.  The patient's history has been reviewed, patient examined, no change in status, stable for surgery.  I have reviewed the patient's chart and labs.  Questions were answered to the patient's satisfaction.     [place]

## 2020-02-22 NOTE — Discharge Instructions (Signed)
    PORT-A-CATH: POST OP INSTRUCTIONS  Always review your discharge instruction sheet given to you by the facility where your surgery was performed.   1. A prescription for pain medication may be given to you upon discharge. Take your pain medication as prescribed, if needed. If narcotic pain medicine is not needed, then you make take acetaminophen (Tylenol) or ibuprofen (Advil) as needed.  2. Take your usually prescribed medications unless otherwise directed. 3. If you need a refill on your pain medication, please contact our office. All narcotic pain medicine now requires a paper prescription.  Phoned in and fax refills are no longer allowed by law.  Prescriptions will not be filled after 5 pm or on weekends.  4. You should follow a light diet for the remainder of the day after your procedure. 5. Most patients will experience some mild swelling and/or bruising in the area of the incision. It may take several days to resolve. 6. It is common to experience some constipation if taking pain medication after surgery. Increasing fluid intake and taking a stool softener (such as Colace) will usually help or prevent this problem from occurring. A mild laxative (Milk of Magnesia or Miralax) should be taken according to package directions if there are no bowel movements after 48 hours.  7. Unless discharge instructions indicate otherwise, you may remove your bandages 48 hours after surgery, and you may shower at that time. You may have steri-strips (small white skin tapes) in place directly over the incision.  These strips should be left on the skin for 7-10 days.  If your surgeon used Dermabond (skin glue) on the incision, you may shower in 24 hours.  The glue will flake off over the next 2-3 weeks.  8. If your port is left accessed at the end of surgery (needle left in port), the dressing cannot get wet and should only by changed by a healthcare professional. When the port is no longer accessed (when the  needle has been removed), follow step 7.   9. ACTIVITIES:  Limit activity involving your arms for the next 72 hours. Do no strenuous exercise or activity for 1 week. You may drive when you are no longer taking prescription pain medication, you can comfortably wear a seatbelt, and you can maneuver your car. 10.You may need to see your doctor in the office for a follow-up appointment.  Please       check with your doctor.  11.When you receive a new Port-a-Cath, you will get a product guide and        ID card.  Please keep them in case you need them.  WHEN TO CALL YOUR DOCTOR ([phone]): 1. Fever over 101.0 2. Chills 3. Continued bleeding from incision 4. Increased redness and tenderness at the site 5. Shortness of breath, difficulty breathing   The clinic staff is available to answer your questions during regular business hours. Please don't hesitate to call and ask to speak to one of the nurses or medical assistants for clinical concerns. If you have a medical emergency, go to the nearest emergency room or call 911.  A surgeon from [facility] is always on call at the hospital.     For further information, please visit [email or web address]      

## 2020-02-23 ENCOUNTER — Encounter: Payer: Self-pay | Admitting: *Deleted

## 2020-02-23 ENCOUNTER — Inpatient Hospital Stay: Payer: 59

## 2020-02-23 ENCOUNTER — Other Ambulatory Visit: Payer: Self-pay

## 2020-02-23 ENCOUNTER — Inpatient Hospital Stay (HOSPITAL_BASED_OUTPATIENT_CLINIC_OR_DEPARTMENT_OTHER): Payer: 59 | Admitting: Hematology and Oncology

## 2020-02-23 ENCOUNTER — Inpatient Hospital Stay: Payer: 59 | Admitting: Licensed Clinical Social Worker

## 2020-02-23 DIAGNOSIS — C50411 Malignant neoplasm of upper-outer quadrant of right female breast: Secondary | ICD-10-CM | POA: Diagnosis not present

## 2020-02-23 DIAGNOSIS — Z171 Estrogen receptor negative status [ER-]: Secondary | ICD-10-CM

## 2020-02-23 DIAGNOSIS — Z5111 Encounter for antineoplastic chemotherapy: Secondary | ICD-10-CM | POA: Diagnosis not present

## 2020-02-23 DIAGNOSIS — R5383 Other fatigue: Secondary | ICD-10-CM | POA: Diagnosis not present

## 2020-02-23 DIAGNOSIS — Z95828 Presence of other vascular implants and grafts: Secondary | ICD-10-CM

## 2020-02-23 DIAGNOSIS — R110 Nausea: Secondary | ICD-10-CM | POA: Diagnosis not present

## 2020-02-23 DIAGNOSIS — Z79899 Other long term (current) drug therapy: Secondary | ICD-10-CM | POA: Diagnosis not present

## 2020-02-23 DIAGNOSIS — T451X5A Adverse effect of antineoplastic and immunosuppressive drugs, initial encounter: Secondary | ICD-10-CM | POA: Diagnosis not present

## 2020-02-23 DIAGNOSIS — R590 Localized enlarged lymph nodes: Secondary | ICD-10-CM | POA: Diagnosis not present

## 2020-02-23 DIAGNOSIS — K219 Gastro-esophageal reflux disease without esophagitis: Secondary | ICD-10-CM | POA: Diagnosis not present

## 2020-02-23 LAB — CMP (CANCER CENTER ONLY)
ALT: 17 U/L (ref 0–44)
AST: 16 U/L (ref 15–41)
Albumin: 3.8 g/dL (ref 3.5–5.0)
Alkaline Phosphatase: 58 U/L (ref 38–126)
Anion gap: 7 (ref 5–15)
BUN: 10 mg/dL (ref 6–20)
CO2: 26 mmol/L (ref 22–32)
Calcium: 9.1 mg/dL (ref 8.9–10.3)
Chloride: 110 mmol/L (ref 98–111)
Creatinine: 0.67 mg/dL (ref 0.44–1.00)
GFR, Est AFR Am: >60 mL/min (ref >60)
GFR, Estimated: >60 mL/min (ref >60)
Glucose, Bld: 79 mg/dL (ref 70–99)
Potassium: 4.2 mmol/L (ref 3.5–5.1)
Sodium: 143 mmol/L (ref 135–145)
Total Bilirubin: 0.2 mg/dL — ABNORMAL LOW (ref 0.3–1.2)
Total Protein: 6.7 g/dL (ref 6.5–8.1)

## 2020-02-23 LAB — CBC WITH DIFFERENTIAL (CANCER CENTER ONLY)
Abs Immature Granulocytes: 0.03 10*3/uL (ref 0.00–0.07)
Basophils Absolute: 0 10*3/uL (ref 0.0–0.1)
Basophils Relative: 0 %
Eosinophils Absolute: 0.1 10*3/uL (ref 0.0–0.5)
Eosinophils Relative: 1 %
HCT: 34.9 % — ABNORMAL LOW (ref 36.0–46.0)
Hemoglobin: 11.5 g/dL — ABNORMAL LOW (ref 12.0–15.0)
Immature Granulocytes: 0 %
Lymphocytes Relative: 32 %
Lymphs Abs: 3 10*3/uL (ref 0.7–4.0)
MCH: 28.8 pg (ref 26.0–34.0)
MCHC: 33 g/dL (ref 30.0–36.0)
MCV: 87.3 fL (ref 80.0–100.0)
Monocytes Absolute: 0.7 10*3/uL (ref 0.1–1.0)
Monocytes Relative: 7 %
Neutro Abs: 5.4 10*3/uL (ref 1.7–7.7)
Neutrophils Relative %: 60 %
Platelet Count: 169 10*3/uL (ref 150–400)
RBC: 4 MIL/uL (ref 3.87–5.11)
RDW: 12.5 % (ref 11.5–15.5)
WBC Count: 9.2 10*3/uL (ref 4.0–10.5)
nRBC: 0 % (ref 0.0–0.2)

## 2020-02-23 MED ORDER — PALONOSETRON HCL INJECTION 0.25 MG/5ML
0.2500 mg | Freq: Once | INTRAVENOUS | Status: AC
  Administered 2020-02-23: 0.25 mg via INTRAVENOUS

## 2020-02-23 MED ORDER — SODIUM CHLORIDE 0.9 % IV SOLN
150.0000 mg | Freq: Once | INTRAVENOUS | Status: AC
  Administered 2020-02-23: 150 mg via INTRAVENOUS
  Filled 2020-02-23: qty 150

## 2020-02-23 MED ORDER — SODIUM CHLORIDE 0.9% FLUSH
10.0000 mL | INTRAVENOUS | Status: DC | PRN
  Administered 2020-02-23: 10 mL via INTRAVENOUS
  Filled 2020-02-23: qty 10

## 2020-02-23 MED ORDER — PEGFILGRASTIM 6 MG/0.6ML ~~LOC~~ PSKT
6.0000 mg | PREFILLED_SYRINGE | Freq: Once | SUBCUTANEOUS | Status: AC
  Administered 2020-02-23: 6 mg via SUBCUTANEOUS

## 2020-02-23 MED ORDER — SODIUM CHLORIDE 0.9 % IV SOLN
600.0000 mg/m2 | Freq: Once | INTRAVENOUS | Status: AC
  Administered 2020-02-23: 980 mg via INTRAVENOUS
  Filled 2020-02-23: qty 49

## 2020-02-23 MED ORDER — SODIUM CHLORIDE 0.9 % IV SOLN
Freq: Once | INTRAVENOUS | Status: AC
  Filled 2020-02-23: qty 250

## 2020-02-23 MED ORDER — SODIUM CHLORIDE 0.9% FLUSH
10.0000 mL | INTRAVENOUS | Status: DC | PRN
  Administered 2020-02-23: 12:00:00 10 mL
  Filled 2020-02-23: qty 10

## 2020-02-23 MED ORDER — SODIUM CHLORIDE 0.9 % IV SOLN
10.0000 mg | Freq: Once | INTRAVENOUS | Status: AC
  Administered 2020-02-23: 10 mg via INTRAVENOUS
  Filled 2020-02-23: qty 10

## 2020-02-23 MED ORDER — PALONOSETRON HCL INJECTION 0.25 MG/5ML
INTRAVENOUS | Status: AC
  Filled 2020-02-23: qty 5

## 2020-02-23 MED ORDER — HEPARIN SOD (PORK) LOCK FLUSH 100 UNIT/ML IV SOLN
500.0000 [IU] | Freq: Once | INTRAVENOUS | Status: AC | PRN
  Administered 2020-02-23: 500 [IU]
  Filled 2020-02-23: qty 5

## 2020-02-23 MED ORDER — PEGFILGRASTIM 6 MG/0.6ML ~~LOC~~ PSKT
PREFILLED_SYRINGE | SUBCUTANEOUS | Status: AC
  Filled 2020-02-23: qty 0.6

## 2020-02-23 MED ORDER — DOXORUBICIN HCL CHEMO IV INJECTION 2 MG/ML
60.0000 mg/m2 | Freq: Once | INTRAVENOUS | Status: AC
  Administered 2020-02-23: 98 mg via INTRAVENOUS
  Filled 2020-02-23: qty 49

## 2020-02-23 NOTE — Progress Notes (Signed)
[place] CSW Progress Note  Holiday representative met with patient in infusion to provide emotional support during first chemo treatment. Patient reports doing much better since our initial meeting. She feels she is coping better, she has now told her sons and they are being supportive and handled the news well. Has not told her parents yet as her dad just had his own procedure, but plans to tell them sometime after she sees how she handles this first treatment. Using her regular coping skills and enjoyable activities.   Main concern today is that she had trouble sleeping for the last 3 days, partly due to working from home prior to port placement, partly because of anxiety related to port placement and first chemo treatment. CSW normalized concerns and discussed ways to manage worries at night, including writing them down for the next day and using meditation, imagery, or another soothing activity to focus on. If sleep does not improve next week, patient will contact this CSW and let her doctor know.   Follow-up: patient to contact CSW as needed if sleep does not improve or other concerns arise    Sharyn Lull E Alli Jasmer LCSW, LCSW

## 2020-02-23 NOTE — Patient Instructions (Signed)

## 2020-02-23 NOTE — Assessment & Plan Note (Signed)
02/02/2020:Right breast lump tenderness. Diagnostic mammogram and [place] on 02/01/20 showed a 2.0cm mass at the 10 o'clock position with no axillary adenopathy. 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 Neoadjuvant chemotherapy with dose dense Adriamycin and Cytoxan x4 followed by Taxol and carboplatin  2.  Breast conserving surgery with sentinel lymph node biopsy 3. Adjuvant radiation therapy  CT CAP 02/14/2020: 1.8 cm right breast lesion, 5 mm right axillary lymph node Bone scan 02/16/2020: No evidence of metastatic disease Breast MRI 02/14/2020: 2 cm right breast cancer.  3 indeterminate 4 and 5 mm enhancing masses (recommend MRI biopsy), single prominent right axillary lymph node (second look ultrasound and biopsy recommended) ------------------------------------------------------------------------------------------------------------------------------------------------------- Current treatment: Cycle 1 dose dense Adriamycin and Cytoxan Echocardiogram 02/20/2020: EF 60 to 65% Labs reviewed, antiemetics reviewed Chemo consent obtained, chemo education completed  Return to clinic in 1 week for toxicity check

## 2020-02-26 ENCOUNTER — Telehealth: Payer: Self-pay | Admitting: Licensed Clinical Social Worker

## 2020-02-26 ENCOUNTER — Other Ambulatory Visit: Payer: Self-pay

## 2020-02-26 ENCOUNTER — Telehealth: Payer: Self-pay | Admitting: *Deleted

## 2020-02-26 ENCOUNTER — Encounter: Payer: Self-pay | Admitting: Licensed Clinical Social Worker

## 2020-02-26 ENCOUNTER — Ambulatory Visit: Payer: Self-pay | Admitting: Licensed Clinical Social Worker

## 2020-02-26 ENCOUNTER — Inpatient Hospital Stay: Payer: 59

## 2020-02-26 DIAGNOSIS — Z1379 Encounter for other screening for genetic and chromosomal anomalies: Secondary | ICD-10-CM

## 2020-02-26 DIAGNOSIS — Z800 Family history of malignant neoplasm of digestive organs: Secondary | ICD-10-CM

## 2020-02-26 DIAGNOSIS — Z803 Family history of malignant neoplasm of breast: Secondary | ICD-10-CM

## 2020-02-26 DIAGNOSIS — C50411 Malignant neoplasm of upper-outer quadrant of right female breast: Secondary | ICD-10-CM

## 2020-02-26 DIAGNOSIS — Z801 Family history of malignant neoplasm of trachea, bronchus and lung: Secondary | ICD-10-CM

## 2020-02-26 NOTE — Telephone Encounter (Signed)
Revealed negative genetic testing.  This normal result is reassuring and indicates that it is unlikely Virginia [name]'s cancer is due to a hereditary cause.  It is unlikely that there is an increased risk of another cancer due to a mutation in one of these genes.  However, genetic testing is not perfect, and cannot definitively rule out a hereditary cause.  It will be important for her to keep in contact with genetics to learn if any additional testing may be needed in the future.

## 2020-02-26 NOTE — Progress Notes (Signed)
HPI:  Ms. Mcdougle was previously seen in the [facility] due to a personal and family history of cancer and concerns regarding a hereditary predisposition to cancer. Please refer to our prior cancer genetics clinic note for more information regarding our discussion, assessment and recommendations, at the time. Ms. Kocak recent genetic test results were disclosed to her, as were recommendations warranted by these results. These results and recommendations are discussed in more detail below.  CANCER HISTORY:  Oncology History  Malignant neoplasm of upper-outer quadrant of right breast in female, estrogen receptor negative ([place])  02/02/2020 Initial Diagnosis   Right breast lump tenderness. Diagnostic mammogram and [place] on 02/01/20 showed a 2.0cm mass at the 10 o'clock position with no axillary adenopathy. 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, ER/PR negative, Ki67 80%   02/08/2020 Cancer Staging   Staging form: Breast, AJCC 8th Edition - Clinical stage from 02/08/2020: Stage IB (cT1c, cN0, cM0, G3, ER-, PR-, HER2-) - Signed by Nicholas Lose, MD on 02/08/2020   02/23/2020 -  Chemotherapy   The patient had DOXOrubicin (ADRIAMYCIN) chemo injection 98 mg, 60 mg/m2 = 98 mg, Intravenous,  Once, 1 of 4 cycles Administration: 98 mg (02/23/2020) palonosetron (ALOXI) injection 0.25 mg, 0.25 mg, Intravenous,  Once, 1 of 8 cycles Administration: 0.25 mg (02/23/2020) pegfilgrastim (NEULASTA ONPRO KIT) injection 6 mg, 6 mg, Subcutaneous, Once, 1 of 4 cycles Administration: 6 mg (02/23/2020) CARBOplatin (PARAPLATIN) 700 mg in sodium chloride 0.9 % 250 mL chemo infusion, 700 mg (100 % of original dose 700 mg), Intravenous,  Once, 0 of 4 cycles Dose modification: 700 mg (original dose 700 mg, Cycle 5) cyclophosphamide (CYTOXAN) 980 mg in sodium chloride 0.9 % 250 mL chemo infusion, 600 mg/m2 = 980 mg, Intravenous,  Once, 1 of 4 cycles Administration: 980 mg  (02/23/2020) PACLitaxel (TAXOL) 132 mg in sodium chloride 0.9 % 250 mL chemo infusion (</= 34m/m2), 80 mg/m2 = 132 mg, Intravenous,  Once, 0 of 4 cycles fosaprepitant (EMEND) 150 mg in sodium chloride 0.9 % 145 mL IVPB, 150 mg, Intravenous,  Once, 1 of 8 cycles Administration: 150 mg (02/23/2020)  for chemotherapy treatment.     Genetic Testing   Negative genetic testing. No pathogenic variants identified on the [facility] Common Hereditary Cancers Panel. The report date is 02/25/2020.   The Common Hereditary Cancers Panel offered by [facility] includes sequencing and/or deletion duplication testing of the following 48 genes: APC, ATM, AXIN2, BARD1, BMPR1A, BRCA1, BRCA2, BRIP1, CDH1, CDKN2A (p14ARF), CDKN2A (p16INK4a), CKD4, CHEK2, CTNNA1, DICER1, EPCAM (Deletion/duplication testing only), GREM1 (promoter region deletion/duplication testing only), KIT, MEN1, MLH1, MSH2, MSH3, MSH6, MUTYH, NBN, NF1, NHTL1, PALB2, PDGFRA, PMS2, POLD1, POLE, PTEN, RAD50, RAD51C, RAD51D, RNF43, SDHB, SDHC, SDHD, SMAD4, SMARCA4. STK11, TP53, TSC1, TSC2, and VHL.  The following genes were evaluated for sequence changes only: SDHA and HOXB13 c.251G>A variant only.     FAMILY HISTORY:  We obtained a detailed, 4-generation family history.  Significant diagnoses are listed below: Family History  Problem Relation Age of Onset  . Anxiety disorder Mother   . Heart disease Father 558      CABG x4  . Diabetes Father   . Hypertension Father   . Hypothyroidism Father   . Anxiety disorder Daughter   . Heart disease Maternal Grandmother   . Stroke Maternal Grandmother 68  . Heart disease Paternal Aunt   . Stroke Paternal Aunt   . Heart disease Paternal Uncle   . Pancreatic  cancer Paternal Uncle        dx 52s  . Hypertension Sister   . Hyperlipidemia Sister   . Hypothyroidism Sister   . Lung cancer Maternal Uncle   . Healthy Son   . Breast cancer Cousin   . Lung cancer Paternal Aunt   . Colon cancer Neg Hx   . Ovarian  cancer Neg Hx   . Cervical cancer Neg Hx     Ms. Fassnacht has a daughter and a son. She has 2 sisters, no cancer history.  Ms. Mutchler mother is living at 46 with no cancer history. Patient had 2 maternal uncles and 2 maternal aunts. Both uncles died of lung cancer in their 28s, both had history of smoking. No known cancers in maternal cousins. Maternal grandmother died of a stroke at 51, grandfather died in his 31s of a heart attack.  Ms. Marchiano father is living at 77, no cancer history. Patient has 5 paternal aunts and 4 paternal uncles. One uncle had pancreatic cancer in his 1s and died in his 23s. An aunt had lung cancer and history of smoking. A paternal cousin had breast cancer in her 17s. Paternal grandmother died at 69, grandfather died in his 57s.   Ms. Magley is unaware of previous family history of genetic testing for hereditary cancer risks. Patient's maternal ancestors are of unknown descent, and paternal ancestors are of unknown descent. There is no reported Ashkenazi Jewish ancestry. There is no known consanguinity.  GENETIC TEST RESULTS: Genetic testing reported out on 02/25/2020 through the [facility] Common Hereditary cancer panel found no pathogenic mutations.  The Common Hereditary Cancers Panel offered by [facility] includes sequencing and/or deletion duplication testing of the following 48 genes: APC, ATM, AXIN2, BARD1, BMPR1A, BRCA1, BRCA2, BRIP1, CDH1, CDKN2A (p14ARF), CDKN2A (p16INK4a), CKD4, CHEK2, CTNNA1, DICER1, EPCAM (Deletion/duplication testing only), GREM1 (promoter region deletion/duplication testing only), KIT, MEN1, MLH1, MSH2, MSH3, MSH6, MUTYH, NBN, NF1, NHTL1, PALB2, PDGFRA, PMS2, POLD1, POLE, PTEN, RAD50, RAD51C, RAD51D, RNF43, SDHB, SDHC, SDHD, SMAD4, SMARCA4. STK11, TP53, TSC1, TSC2, and VHL.  The following genes were evaluated for sequence changes only: SDHA and HOXB13 c.251G>A variant only.  The test report has been scanned into EPIC and is  located under the Molecular Pathology section of the Results Review tab.  A portion of the result report is included below for reference.     We discussed with Ms. Brindley that because current genetic testing is not perfect, it is possible there may be a gene mutation in one of these genes that current testing cannot detect, but that chance is small.  We also discussed, that there could be another gene that has not yet been discovered, or that we have not yet tested, that is responsible for the cancer diagnoses in the family. It is also possible there is a hereditary cause for the cancer in the family that Ms. Hoch did not inherit and therefore was not identified in her testing.  Therefore, it is important to remain in touch with cancer genetics in the future so that we can continue to offer Ms. Rabe the most up to date genetic testing.   ADDITIONAL GENETIC TESTING: We discussed with Ms. Rorabaugh that her genetic testing was fairly extensive.  If there are genes identified to increase cancer risk that can be analyzed in the future, we would be happy to discuss and coordinate this testing at that time.    CANCER SCREENING RECOMMENDATIONS: Ms. Slaydon test result is considered negative (normal).  This means that we have not identified a hereditary cause for her  personal and family history of cancer at this time. Most cancers happen by chance and this negative test suggests that her cancer may fall into this category.    While reassuring, this does not definitively rule out a hereditary predisposition to cancer. It is still possible that there could be genetic mutations that are undetectable by current technology. There could be genetic mutations in genes that have not been tested or identified to increase cancer risk.  Therefore, it is recommended she continue to follow the cancer management and screening guidelines provided by her oncology and primary healthcare provider.   An  individual's cancer risk and medical management are not determined by genetic test results alone. Overall cancer risk assessment incorporates additional factors, including personal medical history, family history, and any available genetic information that may result in a personalized plan for cancer prevention and surveillance.  RECOMMENDATIONS FOR FAMILY MEMBERS:  Relatives in this family might be at some increased risk of developing cancer, over the general population risk, simply due to the family history of cancer.  We recommended female relatives in this family have a yearly mammogram beginning at age 93, or 78 years younger than the earliest onset of cancer, an annual clinical breast exam, and perform monthly breast self-exams. Female relatives in this family should also have a gynecological exam as recommended by their primary provider. All family members should have a colonoscopy by age 17, or as directed by their physicians.  It is also possible there is a hereditary cause for the cancer in Ms. Mervin's family that she did not inherit and therefore was not identified in her.  Based on Ms. Hord's family history, we recommended her paternal relatives/those related to her uncle who had pancreatic cancer have genetic counseling and testing. Ms. Liddell will let us know if we can be of any assistance in coordinating genetic counseling and/or testing for these family members.  FOLLOW-UP: Lastly, we discussed with Ms. Dacey that cancer genetics is a rapidly advancing field and it is possible that new genetic tests will be appropriate for her and/or her family members in the future. We encouraged her to remain in contact with cancer genetics on an annual basis so we can update her personal and family histories and let her know of advances in cancer genetics that may benefit this family.   Our contact number was provided. Ms. Friley questions were answered to her satisfaction, and she  knows she is welcome to call us at anytime with additional questions or concerns.   Faith Rogue, MS, [facility] Genetic Counselor [email or web address] Phone: [phone]

## 2020-02-27 ENCOUNTER — Telehealth: Payer: Self-pay

## 2020-02-27 NOTE — Telephone Encounter (Signed)
RN spoke with patient, patient is continuing with nausea.  Pt denies any emesis.  Pt tolerating fluids well.   RN reviewed antiemetics, pt taking appropriately.  Pt concerned with normal expectation after chemotherapy.    RN reviewed side effects of A/C, reviewed normal expectations with fatigue, nausea, and general side effects.   RN encouraged continuation of Zofran d/t nausea, along with bland diet, and ginger ale/water.  Pt verbalized understanding.  Will continue to monitor for any changes with nausea/vomiting and report to clinic.

## 2020-02-28 ENCOUNTER — Other Ambulatory Visit: Payer: Self-pay | Admitting: *Deleted

## 2020-02-28 DIAGNOSIS — Z171 Estrogen receptor negative status [ER-]: Secondary | ICD-10-CM

## 2020-02-28 DIAGNOSIS — C50411 Malignant neoplasm of upper-outer quadrant of right female breast: Secondary | ICD-10-CM

## 2020-02-29 ENCOUNTER — Encounter: Payer: Self-pay | Admitting: *Deleted

## 2020-02-29 NOTE — Progress Notes (Signed)
Called pt to assess needs after 1st chemo. Pt relate doing well. Nausea is less than earlier in the week. Encourage to continue to take anti-nausea medications. Discussed some hard candy type with ginger that may help with metallic taste and nausea. Pt will talk to nutritionist on Friday as well. Denies further needs or questions at this time.

## 2020-02-29 NOTE — Progress Notes (Signed)
Patient Care Team: Virginia Crews, MD as PCP - General (Family Medicine) Mauro Kaufmann, RN as Oncology Nurse Navigator Rockwell Germany, RN as Oncology Nurse Navigator  DIAGNOSIS:    ICD-10-CM   1. Malignant neoplasm of upper-outer quadrant of right breast in female, estrogen receptor negative ([place])  C50.411    Z17.1     SUMMARY OF ONCOLOGIC HISTORY: Oncology History  Malignant neoplasm of upper-outer quadrant of right breast in female, estrogen receptor negative ([place])  02/02/2020 Initial Diagnosis   Right breast lump tenderness. Diagnostic mammogram and [place] on 02/01/20 showed a 2.0cm mass at the 10 o'clock position with no axillary adenopathy. 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, ER/PR negative, Ki67 80%   02/08/2020 Cancer Staging   Staging form: Breast, AJCC 8th Edition - Clinical stage from 02/08/2020: Stage IB (cT1c, cN0, cM0, G3, ER-, PR-, HER2-) - Signed by Nicholas Lose, MD on 02/08/2020   02/23/2020 -  Chemotherapy   The patient had DOXOrubicin (ADRIAMYCIN) chemo injection 98 mg, 60 mg/m2 = 98 mg, Intravenous,  Once, 1 of 4 cycles Administration: 98 mg (02/23/2020) palonosetron (ALOXI) injection 0.25 mg, 0.25 mg, Intravenous,  Once, 1 of 8 cycles Administration: 0.25 mg (02/23/2020) pegfilgrastim (NEULASTA ONPRO KIT) injection 6 mg, 6 mg, Subcutaneous, Once, 1 of 4 cycles Administration: 6 mg (02/23/2020) CARBOplatin (PARAPLATIN) 700 mg in sodium chloride 0.9 % 250 mL chemo infusion, 700 mg (100 % of original dose 700 mg), Intravenous,  Once, 0 of 4 cycles Dose modification: 700 mg (original dose 700 mg, Cycle 5) cyclophosphamide (CYTOXAN) 980 mg in sodium chloride 0.9 % 250 mL chemo infusion, 600 mg/m2 = 980 mg, Intravenous,  Once, 1 of 4 cycles Administration: 980 mg (02/23/2020) PACLitaxel (TAXOL) 132 mg in sodium chloride 0.9 % 250 mL chemo infusion (</= 45m/m2), 80 mg/m2 = 132 mg, Intravenous,  Once, 0 of 4 cycles fosaprepitant (EMEND) 150 mg  in sodium chloride 0.9 % 145 mL IVPB, 150 mg, Intravenous,  Once, 1 of 8 cycles Administration: 150 mg (02/23/2020)  for chemotherapy treatment.     Genetic Testing   Negative genetic testing. No pathogenic variants identified on the [facility] Common Hereditary Cancers Panel. The report date is 02/25/2020.   The Common Hereditary Cancers Panel offered by [facility] includes sequencing and/or deletion duplication testing of the following 48 genes: APC, ATM, AXIN2, BARD1, BMPR1A, BRCA1, BRCA2, BRIP1, CDH1, CDKN2A (p14ARF), CDKN2A (p16INK4a), CKD4, CHEK2, CTNNA1, DICER1, EPCAM (Deletion/duplication testing only), GREM1 (promoter region deletion/duplication testing only), KIT, MEN1, MLH1, MSH2, MSH3, MSH6, MUTYH, NBN, NF1, NHTL1, PALB2, PDGFRA, PMS2, POLD1, POLE, PTEN, RAD50, RAD51C, RAD51D, RNF43, SDHB, SDHC, SDHD, SMAD4, SMARCA4. STK11, TP53, TSC1, TSC2, and VHL.  The following genes were evaluated for sequence changes only: SDHA and HOXB13 c.251G>A variant only.     CHIEF COMPLIANT: Cycle 1 Day 8 Adriamycin and Cytoxan  INTERVAL HISTORY: Virginia YOKUMis a 54y.o. with above-mentioned history of right breast cancer currently on neoadjuvant chemotherapy with dose dense Adriamycin and Cytoxan. She presents to the clinic today for a toxicity check following cycle 1.     Following chemotherapy she had a lot of acidity which caused some nausea but that lasted for 2 to 3 days after chemo.  She started taking Zofran on day 3 and she felt much better.  Food does not taste great and therefore she has not been able to eat as well.  Denies any mouth sores.  She has been working from home  every day.  ALLERGIES:  has No Known Allergies.  MEDICATIONS:  Current Outpatient Medications  Medication Sig Dispense Refill  . dexamethasone (DECADRON) 4 MG tablet Take 1 tablet day after chemo and 1 tablet 2 days after chemo with food 8 tablet 0  . escitalopram (LEXAPRO) 20 MG tablet TAKE 1 TABLET (20 MG TOTAL) BY MOUTH  DAILY. 90 tablet 1  . lidocaine-prilocaine (EMLA) cream Apply to affected area once 30 g 3  . LORazepam (ATIVAN) 0.5 MG tablet Take 1 tablet (0.5 mg total) by mouth at bedtime as needed (Nausea or vomiting). 30 tablet 0  . omeprazole (PRILOSEC) 40 MG capsule Take 1 capsule (40 mg total) by mouth daily. 30 capsule 3  . ondansetron (ZOFRAN) 8 MG tablet Take 1 tablet (8 mg total) by mouth 2 (two) times daily as needed. Start on the third day after chemotherapy. 30 tablet 1  . prochlorperazine (COMPAZINE) 10 MG tablet Take 1 tablet (10 mg total) by mouth every 6 (six) hours as needed (Nausea or vomiting). 30 tablet 1   No current facility-administered medications for this visit.    PHYSICAL EXAMINATION: ECOG PERFORMANCE STATUS: 1 - Symptomatic but completely ambulatory  Vitals:   03/01/20 1111  BP: 109/71  Pulse: 82  Resp: 20  Temp: 98 F (36.7 C)  SpO2: 100%   Filed Weights   03/01/20 1111  Weight: 134 lb 11.2 oz (61.1 kg)    LABORATORY DATA:  I have reviewed the data as listed CMP Latest Ref Rng & Units 03/01/2020 02/23/2020 02/19/2020  Glucose 70 - 99 mg/dL 85 79 84  BUN 6 - 20 mg/dL '9 10 8  ' Creatinine 0.44 - 1.00 mg/dL 0.62 0.67 0.61  Sodium 135 - 145 mmol/L 142 143 141  Potassium 3.5 - 5.1 mmol/L 4.2 4.2 3.9  Chloride 98 - 111 mmol/L 108 110 108  CO2 22 - 32 mmol/L '25 26 28  ' Calcium 8.9 - 10.3 mg/dL 8.9 9.1 9.1  Total Protein 6.5 - 8.1 g/dL 6.7 6.7 6.8  Total Bilirubin 0.3 - 1.2 mg/dL 0.4 0.2(L) 0.5  Alkaline Phos 38 - 126 U/L 76 58 52  AST 15 - 41 U/L 12(L) 16 15  ALT 0 - 44 U/L 39 17 11    Lab Results  Component Value Date   WBC 2.8 (L) 03/01/2020   HGB 11.5 (L) 03/01/2020   HCT 34.6 (L) 03/01/2020   MCV 87.2 03/01/2020   PLT 144 (L) 03/01/2020   NEUTROABS 1.2 (L) 03/01/2020    ASSESSMENT & PLAN:  Malignant neoplasm of upper-outer quadrant of right breast in female, estrogen receptor negative ([place]) 02/02/2020:Right breast lump tenderness. Diagnostic mammogram and  [place] on 02/01/20 showed a 2.0cm mass at the 10 o'clock position with no axillary adenopathy. 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FISH negative, ER/PR negative, Ki67 80% T1CN0 stage Ib  Recommendation: 1.Neoadjuvant chemotherapy with dose dense Adriamycin and Cytoxan x4 followed by Taxol and carboplatin 2. Breast conserving surgery with sentinel lymph node biopsy 3.Adjuvant radiation therapy  CT CAP 02/14/2020: 1.8 cm right breast lesion, 5 mm right axillary lymph node Bone scan 02/16/2020: No evidence of metastatic disease Breast MRI 02/14/2020: 2 cm right breast cancer.  3 indeterminate 4 and 5 mm enhancing masses (recommend MRI biopsy), single prominent right axillary lymph node (second look ultrasound and biopsy recommended) ------------------------------------------------------------------------------------------------------------------------------------------------------- Current treatment: Cycle 1 day 8 dose dense Adriamycin and Cytoxan Echocardiogram 02/20/2020: EF 60 to 65% Chemo toxicities: 1.  Acid reflux/nausea: I  sent a prescription for omeprazole. 2.  Mild fatigue  Overall she tolerated the chemotherapy very well.  With ANC of 1.2 today she does not need to take neutropenic precautions.  She is planning to go to work in person.  Return to clinic in 1 week for cycle 2   No orders of the defined types were placed in this encounter.  The patient has a good understanding of the overall plan. she agrees with it. she will call with any problems that may develop before the next visit here.  Total time spent: 30 mins including face to face time and time spent for planning, charting and coordination of care  Nicholas Lose, MD 03/01/2020  I, Cloyde Reams Dorshimer, am acting as scribe for Dr. Nicholas Lose.  I have reviewed the above documentation for accuracy and completeness, and I agree with the above.

## 2020-03-01 ENCOUNTER — Other Ambulatory Visit: Payer: Self-pay

## 2020-03-01 ENCOUNTER — Inpatient Hospital Stay: Payer: 59

## 2020-03-01 ENCOUNTER — Inpatient Hospital Stay (HOSPITAL_BASED_OUTPATIENT_CLINIC_OR_DEPARTMENT_OTHER): Payer: 59 | Admitting: Hematology and Oncology

## 2020-03-01 ENCOUNTER — Inpatient Hospital Stay: Payer: 59 | Admitting: Nutrition

## 2020-03-01 ENCOUNTER — Other Ambulatory Visit: Payer: Self-pay | Admitting: Hematology and Oncology

## 2020-03-01 DIAGNOSIS — K219 Gastro-esophageal reflux disease without esophagitis: Secondary | ICD-10-CM | POA: Diagnosis not present

## 2020-03-01 DIAGNOSIS — C50411 Malignant neoplasm of upper-outer quadrant of right female breast: Secondary | ICD-10-CM

## 2020-03-01 DIAGNOSIS — T451X5A Adverse effect of antineoplastic and immunosuppressive drugs, initial encounter: Secondary | ICD-10-CM | POA: Diagnosis not present

## 2020-03-01 DIAGNOSIS — Z79899 Other long term (current) drug therapy: Secondary | ICD-10-CM | POA: Diagnosis not present

## 2020-03-01 DIAGNOSIS — R110 Nausea: Secondary | ICD-10-CM | POA: Diagnosis not present

## 2020-03-01 DIAGNOSIS — Z171 Estrogen receptor negative status [ER-]: Secondary | ICD-10-CM | POA: Diagnosis not present

## 2020-03-01 DIAGNOSIS — Z95828 Presence of other vascular implants and grafts: Secondary | ICD-10-CM

## 2020-03-01 DIAGNOSIS — R590 Localized enlarged lymph nodes: Secondary | ICD-10-CM | POA: Diagnosis not present

## 2020-03-01 DIAGNOSIS — Z5111 Encounter for antineoplastic chemotherapy: Secondary | ICD-10-CM | POA: Diagnosis not present

## 2020-03-01 DIAGNOSIS — R5383 Other fatigue: Secondary | ICD-10-CM | POA: Diagnosis not present

## 2020-03-01 LAB — CBC WITH DIFFERENTIAL (CANCER CENTER ONLY)
Abs Immature Granulocytes: 0.04 10*3/uL (ref 0.00–0.07)
Basophils Absolute: 0.1 10*3/uL (ref 0.0–0.1)
Basophils Relative: 3 %
Eosinophils Absolute: 0.2 10*3/uL (ref 0.0–0.5)
Eosinophils Relative: 6 %
HCT: 34.6 % — ABNORMAL LOW (ref 36.0–46.0)
Hemoglobin: 11.5 g/dL — ABNORMAL LOW (ref 12.0–15.0)
Immature Granulocytes: 1 %
Lymphocytes Relative: 40 %
Lymphs Abs: 1.1 10*3/uL (ref 0.7–4.0)
MCH: 29 pg (ref 26.0–34.0)
MCHC: 33.2 g/dL (ref 30.0–36.0)
MCV: 87.2 fL (ref 80.0–100.0)
Monocytes Absolute: 0.2 10*3/uL (ref 0.1–1.0)
Monocytes Relative: 9 %
Neutro Abs: 1.2 10*3/uL — ABNORMAL LOW (ref 1.7–7.7)
Neutrophils Relative %: 41 %
Platelet Count: 144 10*3/uL — ABNORMAL LOW (ref 150–400)
RBC: 3.97 MIL/uL (ref 3.87–5.11)
RDW: 12.4 % (ref 11.5–15.5)
WBC Count: 2.8 10*3/uL — ABNORMAL LOW (ref 4.0–10.5)
nRBC: 0 % (ref 0.0–0.2)

## 2020-03-01 LAB — CMP (CANCER CENTER ONLY)
ALT: 39 U/L (ref 0–44)
AST: 12 U/L — ABNORMAL LOW (ref 15–41)
Albumin: 3.7 g/dL (ref 3.5–5.0)
Alkaline Phosphatase: 76 U/L (ref 38–126)
Anion gap: 9 (ref 5–15)
BUN: 9 mg/dL (ref 6–20)
CO2: 25 mmol/L (ref 22–32)
Calcium: 8.9 mg/dL (ref 8.9–10.3)
Chloride: 108 mmol/L (ref 98–111)
Creatinine: 0.62 mg/dL (ref 0.44–1.00)
GFR, Est AFR Am: >60 mL/min (ref >60)
GFR, Estimated: >60 mL/min (ref >60)
Glucose, Bld: 85 mg/dL (ref 70–99)
Potassium: 4.2 mmol/L (ref 3.5–5.1)
Sodium: 142 mmol/L (ref 135–145)
Total Bilirubin: 0.4 mg/dL (ref 0.3–1.2)
Total Protein: 6.7 g/dL (ref 6.5–8.1)

## 2020-03-01 MED ORDER — HEPARIN SOD (PORK) LOCK FLUSH 100 UNIT/ML IV SOLN
500.0000 [IU] | Freq: Once | INTRAVENOUS | Status: AC
  Administered 2020-03-01: 500 [IU] via INTRAVENOUS
  Filled 2020-03-01: qty 5

## 2020-03-01 MED ORDER — OMEPRAZOLE 40 MG PO CPDR: 40.0000 mg | DELAYED_RELEASE_CAPSULE | Freq: Every day | ORAL | 3 refills | Status: DC

## 2020-03-01 MED ORDER — SODIUM CHLORIDE 0.9% FLUSH
10.0000 mL | INTRAVENOUS | Status: DC | PRN
  Administered 2020-03-01: 10 mL via INTRAVENOUS
  Filled 2020-03-01: qty 10

## 2020-03-01 MED FILL — OMEPRAZOLE 40 MG CPDR: 40 | 30 days supply | Qty: 30 | Fill #0

## 2020-03-01 NOTE — Assessment & Plan Note (Signed)
02/02/2020:Right breast lump tenderness. Diagnostic mammogram and [place] on 02/01/20 showed a 2.0cm mass at the 10 o'clock position with no axillary adenopathy. 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FISH negative, ER/PR negative, Ki67 80% T1CN0 stage Ib  Recommendation: 1.Neoadjuvant chemotherapy with dose dense Adriamycin and Cytoxan x4 followed by Taxol and carboplatin 2. Breast conserving surgery with sentinel lymph node biopsy 3.Adjuvant radiation therapy  CT CAP 02/14/2020: 1.8 cm right breast lesion, 5 mm right axillary lymph node Bone scan 02/16/2020: No evidence of metastatic disease Breast MRI 02/14/2020: 2 cm right breast cancer.  3 indeterminate 4 and 5 mm enhancing masses (recommend MRI biopsy), single prominent right axillary lymph node (second look ultrasound and biopsy recommended) ------------------------------------------------------------------------------------------------------------------------------------------------------- Current treatment: Cycle 1 day 8 dose dense Adriamycin and Cytoxan Echocardiogram 02/20/2020: EF 60 to 65% Chemo toxicities:  Return to clinic in 1 week for cycle 2

## 2020-03-01 NOTE — Progress Notes (Signed)
54 year old female diagnosed with Breast Cancer. She is a patient of Dr Lindi Adie receiving Adriamycin and Cytoxan with plans for surgery and radiation.  PMH includes Anxiety, Anorexia, and Arthritis. \ Medications include Ativan, Zofran and Compazine.  Labs include alb 3.7.  Height: 5'1". Weight: 134.7 pounds. UBW: 140 pounds. BMI:25.45.  Patient reports her biggest challenge is with taste alterations. States foods taste metallic and she needs suggestions for beverage selections. She cannot tolerate beverages with too much acid in them and has no taste for carbonation. Reports acid reflux. Given prescription for Omeprazole. Noted 5% wt loss in one week.  Nutrition Diagnosis: Food and Nutrition Related knowledge Deficit related to cancer and associated treatments as evidenced by no prior need for nutrition related information.  Intervention: Educated patient on strategies for improving taste alterations. Gave examples of beverages. Encouraged baking soda and salt water rinses. Educated on strategies for eating with nausea. Emailed fact sheets. Questions answered and teach back used.  Monitoring, Evaluation, goals: Tolerate adequate calories, protein and fluid for weight maintenance.  Next Visit: Friday, April 23, during infusion.

## 2020-03-01 NOTE — Patient Instructions (Signed)

## 2020-03-04 NOTE — Progress Notes (Signed)
Pharmacist Chemotherapy Monitoring - Follow Up Assessment    I verify that I have reviewed each item in the below checklist:  . Regimen for the patient is scheduled for the appropriate day and plan matches scheduled date. Marland Kitchen Appropriate non-routine labs are ordered dependent on drug ordered. . If applicable, additional medications reviewed and ordered per protocol based on lifetime cumulative doses and/or treatment regimen.   Plan for follow-up and/or issues identified: No . I-vent associated with next due treatment: No .   Virginia [name] D 03/04/2020 3:09 PM

## 2020-03-05 ENCOUNTER — Encounter: Payer: Self-pay | Admitting: *Deleted

## 2020-03-07 NOTE — Progress Notes (Signed)
Patient Care Team: Virginia Crews, MD as PCP - General (Family Medicine) Mauro Kaufmann, RN as Oncology Nurse Navigator Rockwell Germany, RN as Oncology Nurse Navigator  DIAGNOSIS:    ICD-10-CM   1. Malignant neoplasm of upper-outer quadrant of right breast in female, estrogen receptor negative ([place])  C50.411    Z17.1     SUMMARY OF ONCOLOGIC HISTORY: Oncology History  Malignant neoplasm of upper-outer quadrant of right breast in female, estrogen receptor negative ([place])  02/02/2020 Initial Diagnosis   Right breast lump tenderness. Diagnostic mammogram and [place] on 02/01/20 showed a 2.0cm mass at the 10 o'clock position with no axillary adenopathy. 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, ER/PR negative, Ki67 80%   02/08/2020 Cancer Staging   Staging form: Breast, AJCC 8th Edition - Clinical stage from 02/08/2020: Stage IB (cT1c, cN0, cM0, G3, ER-, PR-, HER2-) - Signed by Nicholas Lose, MD on 02/08/2020   02/23/2020 -  Chemotherapy   The patient had DOXOrubicin (ADRIAMYCIN) chemo injection 98 mg, 60 mg/m2 = 98 mg, Intravenous,  Once, 1 of 4 cycles Administration: 98 mg (02/23/2020) palonosetron (ALOXI) injection 0.25 mg, 0.25 mg, Intravenous,  Once, 1 of 8 cycles Administration: 0.25 mg (02/23/2020) pegfilgrastim (NEULASTA ONPRO KIT) injection 6 mg, 6 mg, Subcutaneous, Once, 1 of 4 cycles Administration: 6 mg (02/23/2020) CARBOplatin (PARAPLATIN) 700 mg in sodium chloride 0.9 % 250 mL chemo infusion, 700 mg (100 % of original dose 700 mg), Intravenous,  Once, 0 of 4 cycles Dose modification: 700 mg (original dose 700 mg, Cycle 5) cyclophosphamide (CYTOXAN) 980 mg in sodium chloride 0.9 % 250 mL chemo infusion, 600 mg/m2 = 980 mg, Intravenous,  Once, 1 of 4 cycles Administration: 980 mg (02/23/2020) PACLitaxel (TAXOL) 132 mg in sodium chloride 0.9 % 250 mL chemo infusion (</= 45m/m2), 80 mg/m2 = 132 mg, Intravenous,  Once, 0 of 4 cycles fosaprepitant (EMEND) 150 mg  in sodium chloride 0.9 % 145 mL IVPB, 150 mg, Intravenous,  Once, 1 of 8 cycles Administration: 150 mg (02/23/2020)  for chemotherapy treatment.     Genetic Testing   Negative genetic testing. No pathogenic variants identified on the [facility] Common Hereditary Cancers Panel. The report date is 02/25/2020.   The Common Hereditary Cancers Panel offered by [facility] includes sequencing and/or deletion duplication testing of the following 48 genes: APC, ATM, AXIN2, BARD1, BMPR1A, BRCA1, BRCA2, BRIP1, CDH1, CDKN2A (p14ARF), CDKN2A (p16INK4a), CKD4, CHEK2, CTNNA1, DICER1, EPCAM (Deletion/duplication testing only), GREM1 (promoter region deletion/duplication testing only), KIT, MEN1, MLH1, MSH2, MSH3, MSH6, MUTYH, NBN, NF1, NHTL1, PALB2, PDGFRA, PMS2, POLD1, POLE, PTEN, RAD50, RAD51C, RAD51D, RNF43, SDHB, SDHC, SDHD, SMAD4, SMARCA4. STK11, TP53, TSC1, TSC2, and VHL.  The following genes were evaluated for sequence changes only: SDHA and HOXB13 c.251G>A variant only.     CHIEF COMPLIANT: Cycle 2 Adriamycin and Cytoxan  INTERVAL HISTORY: Virginia MCCORVEYis a 54y.o. with above-mentioned history of right breast cancer currently on neoadjuvant chemotherapy with dose dense Adriamycin and Cytoxan. She presents to the clinic todayfor cycle 2.   ALLERGIES:  has No Known Allergies.  MEDICATIONS:  Current Outpatient Medications  Medication Sig Dispense Refill  . dexamethasone (DECADRON) 4 MG tablet Take 1 tablet day after chemo and 1 tablet 2 days after chemo with food 8 tablet 0  . escitalopram (LEXAPRO) 20 MG tablet TAKE 1 TABLET (20 MG TOTAL) BY MOUTH DAILY. 90 tablet 1  . lidocaine-prilocaine (EMLA) cream Apply to affected area once 30 g  3  . LORazepam (ATIVAN) 0.5 MG tablet Take 1 tablet (0.5 mg total) by mouth at bedtime as needed (Nausea or vomiting). 30 tablet 0  . omeprazole (PRILOSEC) 40 MG capsule Take 1 capsule (40 mg total) by mouth daily. 30 capsule 3  . ondansetron (ZOFRAN) 8 MG tablet Take 1  tablet (8 mg total) by mouth 2 (two) times daily as needed. Start on the third day after chemotherapy. 30 tablet 1  . prochlorperazine (COMPAZINE) 10 MG tablet Take 1 tablet (10 mg total) by mouth every 6 (six) hours as needed (Nausea or vomiting). 30 tablet 1   No current facility-administered medications for this visit.    PHYSICAL EXAMINATION: ECOG PERFORMANCE STATUS: 1 - Symptomatic but completely ambulatory  There were no vitals filed for this visit. There were no vitals filed for this visit.  LABORATORY DATA:  I have reviewed the data as listed CMP Latest Ref Rng & Units 03/01/2020 02/23/2020 02/19/2020  Glucose 70 - 99 mg/dL 85 79 84  BUN 6 - 20 mg/dL _0 Creatinine 0.44 - 1.00 mg/dL 0.62 0.67 0.61  Sodium 135 - 145 mmol/L 142 143 141  Potassium 3.5 - 5.1 mmol/L 4.2 4.2 3.9  Chloride 98 - 111 mmol/L 108 110 108  CO2 22 - 32 mmol/L _1 Calcium 8.9 - 10.3 mg/dL 8.9 9.1 9.1  Total Protein 6.5 - 8.1 g/dL 6.7 6.7 6.8  Total Bilirubin 0.3 - 1.2 mg/dL 0.4 0.2(L) 0.5  Alkaline Phos 38 - 126 U/L 76 58 52  AST 15 - 41 U/L 12(L) 16 15  ALT 0 - 44 U/L 39 17 11    Lab Results  Component Value Date   WBC 9.4 03/08/2020   HGB 11.9 (L) 03/08/2020   HCT 35.7 (L) 03/08/2020   MCV 86.7 03/08/2020   PLT 139 (L) 03/08/2020   NEUTROABS PENDING 03/08/2020    ASSESSMENT & PLAN:  Malignant neoplasm of upper-outer quadrant of right breast in female, estrogen receptor negative ([place]) 02/02/2020:Right breast lump tenderness. Diagnostic mammogram and [place] on 02/01/20 showed a 2.0cm mass at the 10 o'clock position with no axillary adenopathy. 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FISH negative, ER/PR negative, Ki67 80% T1CN0 stage Ib  Recommendation: 1.Neoadjuvant chemotherapywith dose dense Adriamycin and Cytoxan x4 followed by Taxol and carboplatin 2.Breast conserving surgery with sentinel lymph node biopsy 3.Adjuvant radiation therapy  CT CAP  02/14/2020: 1.8 cm right breast lesion, 5 mm right axillary lymph node Bone scan 02/16/2020: No evidence of metastatic disease Breast MRI 02/14/2020: 2 cm right breast cancer. 3 indeterminate 4 and 5 mm enhancing masses (recommend MRI biopsy), single prominent right axillary lymph node (second look ultrasound and biopsy recommended) ------------------------------------------------------------------------------------------------------------------------------------------------------- Current treatment: Cycle 2 dose dense Adriamycin and Cytoxan Echocardiogram 02/20/2020: EF 60 to 65% Chemo toxicities: 1.  Acid reflux/nausea: Marked improvement with omeprazole. 2.  Mild fatigue  Overall she tolerated the chemotherapy very well.   She is planning to go to work in person.  Return to clinic in 2 weeks for cycle 3    No orders of the defined types were placed in this encounter.  The patient has a good understanding of the overall plan. she agrees with it. she will call with any problems that may develop before the next visit here.  Total time spent: 30 mins including face to face time and time spent for planning, charting and coordination of care  Nicholas Lose, MD 03/08/2020  I, Cloyde Reams  Dorshimer, am acting as scribe for Dr. Nicholas Lose.  I have reviewed the above documentation for accuracy and completeness, and I agree with the above.

## 2020-03-08 ENCOUNTER — Inpatient Hospital Stay (HOSPITAL_BASED_OUTPATIENT_CLINIC_OR_DEPARTMENT_OTHER): Payer: 59 | Admitting: Hematology and Oncology

## 2020-03-08 ENCOUNTER — Inpatient Hospital Stay: Payer: 59

## 2020-03-08 ENCOUNTER — Inpatient Hospital Stay: Payer: 59 | Admitting: Nutrition

## 2020-03-08 ENCOUNTER — Other Ambulatory Visit: Payer: Self-pay

## 2020-03-08 ENCOUNTER — Encounter: Payer: Self-pay | Admitting: *Deleted

## 2020-03-08 DIAGNOSIS — C50411 Malignant neoplasm of upper-outer quadrant of right female breast: Secondary | ICD-10-CM

## 2020-03-08 DIAGNOSIS — R590 Localized enlarged lymph nodes: Secondary | ICD-10-CM | POA: Diagnosis not present

## 2020-03-08 DIAGNOSIS — K219 Gastro-esophageal reflux disease without esophagitis: Secondary | ICD-10-CM | POA: Diagnosis not present

## 2020-03-08 DIAGNOSIS — Z171 Estrogen receptor negative status [ER-]: Secondary | ICD-10-CM

## 2020-03-08 DIAGNOSIS — R5383 Other fatigue: Secondary | ICD-10-CM | POA: Diagnosis not present

## 2020-03-08 DIAGNOSIS — Z5111 Encounter for antineoplastic chemotherapy: Secondary | ICD-10-CM | POA: Diagnosis not present

## 2020-03-08 DIAGNOSIS — R110 Nausea: Secondary | ICD-10-CM | POA: Diagnosis not present

## 2020-03-08 DIAGNOSIS — T451X5A Adverse effect of antineoplastic and immunosuppressive drugs, initial encounter: Secondary | ICD-10-CM | POA: Diagnosis not present

## 2020-03-08 DIAGNOSIS — Z79899 Other long term (current) drug therapy: Secondary | ICD-10-CM | POA: Diagnosis not present

## 2020-03-08 LAB — CMP (CANCER CENTER ONLY)
ALT: 16 U/L (ref 0–44)
AST: 12 U/L — ABNORMAL LOW (ref 15–41)
Albumin: 3.8 g/dL (ref 3.5–5.0)
Alkaline Phosphatase: 77 U/L (ref 38–126)
Anion gap: 9 (ref 5–15)
BUN: 15 mg/dL (ref 6–20)
CO2: 26 mmol/L (ref 22–32)
Calcium: 9.4 mg/dL (ref 8.9–10.3)
Chloride: 107 mmol/L (ref 98–111)
Creatinine: 0.69 mg/dL (ref 0.44–1.00)
GFR, Est AFR Am: >60 mL/min (ref >60)
GFR, Estimated: >60 mL/min (ref >60)
Glucose, Bld: 112 mg/dL — ABNORMAL HIGH (ref 70–99)
Potassium: 4.2 mmol/L (ref 3.5–5.1)
Sodium: 142 mmol/L (ref 135–145)
Total Bilirubin: <0.2 mg/dL — ABNORMAL LOW (ref 0.3–1.2)
Total Protein: 6.8 g/dL (ref 6.5–8.1)

## 2020-03-08 LAB — CBC WITH DIFFERENTIAL (CANCER CENTER ONLY)
Abs Immature Granulocytes: 1.09 10*3/uL — ABNORMAL HIGH (ref 0.00–0.07)
Basophils Absolute: 0.2 10*3/uL — ABNORMAL HIGH (ref 0.0–0.1)
Basophils Relative: 2 %
Eosinophils Absolute: 0.1 10*3/uL (ref 0.0–0.5)
Eosinophils Relative: 1 %
HCT: 35.7 % — ABNORMAL LOW (ref 36.0–46.0)
Hemoglobin: 11.9 g/dL — ABNORMAL LOW (ref 12.0–15.0)
Immature Granulocytes: 12 %
Lymphocytes Relative: 23 %
Lymphs Abs: 2.2 10*3/uL (ref 0.7–4.0)
MCH: 28.9 pg (ref 26.0–34.0)
MCHC: 33.3 g/dL (ref 30.0–36.0)
MCV: 86.7 fL (ref 80.0–100.0)
Monocytes Absolute: 0.7 10*3/uL (ref 0.1–1.0)
Monocytes Relative: 7 %
Neutro Abs: 5.1 10*3/uL (ref 1.7–7.7)
Neutrophils Relative %: 55 %
Platelet Count: 139 10*3/uL — ABNORMAL LOW (ref 150–400)
RBC: 4.12 MIL/uL (ref 3.87–5.11)
RDW: 12.9 % (ref 11.5–15.5)
WBC Count: 9.4 10*3/uL (ref 4.0–10.5)
nRBC: 0 % (ref 0.0–0.2)

## 2020-03-08 MED ORDER — SODIUM CHLORIDE 0.9% FLUSH
10.0000 mL | INTRAVENOUS | Status: DC | PRN
  Administered 2020-03-08: 10 mL
  Filled 2020-03-08: qty 10

## 2020-03-08 MED ORDER — PEGFILGRASTIM 6 MG/0.6ML ~~LOC~~ PSKT
PREFILLED_SYRINGE | SUBCUTANEOUS | Status: AC
  Filled 2020-03-08: qty 0.6

## 2020-03-08 MED ORDER — SODIUM CHLORIDE 0.9 % IV SOLN
600.0000 mg/m2 | Freq: Once | INTRAVENOUS | Status: AC
  Administered 2020-03-08: 980 mg via INTRAVENOUS
  Filled 2020-03-08: qty 49

## 2020-03-08 MED ORDER — HEPARIN SOD (PORK) LOCK FLUSH 100 UNIT/ML IV SOLN
500.0000 [IU] | Freq: Once | INTRAVENOUS | Status: AC | PRN
  Administered 2020-03-08: 500 [IU]
  Filled 2020-03-08: qty 5

## 2020-03-08 MED ORDER — SODIUM CHLORIDE 0.9 % IV SOLN
10.0000 mg | Freq: Once | INTRAVENOUS | Status: AC
  Administered 2020-03-08: 10 mg via INTRAVENOUS
  Filled 2020-03-08: qty 10

## 2020-03-08 MED ORDER — SODIUM CHLORIDE 0.9 % IV SOLN
150.0000 mg | Freq: Once | INTRAVENOUS | Status: AC
  Administered 2020-03-08: 150 mg via INTRAVENOUS
  Filled 2020-03-08: qty 150

## 2020-03-08 MED ORDER — PALONOSETRON HCL INJECTION 0.25 MG/5ML
0.2500 mg | Freq: Once | INTRAVENOUS | Status: AC
  Administered 2020-03-08: 0.25 mg via INTRAVENOUS

## 2020-03-08 MED ORDER — PEGFILGRASTIM 6 MG/0.6ML ~~LOC~~ PSKT
6.0000 mg | PREFILLED_SYRINGE | Freq: Once | SUBCUTANEOUS | Status: AC
  Administered 2020-03-08: 6 mg via SUBCUTANEOUS

## 2020-03-08 MED ORDER — PALONOSETRON HCL INJECTION 0.25 MG/5ML
INTRAVENOUS | Status: AC
  Filled 2020-03-08: qty 5

## 2020-03-08 MED ORDER — DOXORUBICIN HCL CHEMO IV INJECTION 2 MG/ML
60.0000 mg/m2 | Freq: Once | INTRAVENOUS | Status: AC
  Administered 2020-03-08: 98 mg via INTRAVENOUS
  Filled 2020-03-08: qty 49

## 2020-03-08 MED ORDER — SODIUM CHLORIDE 0.9 % IV SOLN
Freq: Once | INTRAVENOUS | Status: AC
  Filled 2020-03-08: qty 250

## 2020-03-08 NOTE — Assessment & Plan Note (Signed)
02/02/2020:Right breast lump tenderness. Diagnostic mammogram and [place] on 02/01/20 showed a 2.0cm mass at the 10 o'clock position with no axillary adenopathy. 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 3 indeterminate 4 and 5 mm enhancing masses (recommend MRI biopsy), single prominent right axillary lymph node (second look ultrasound and biopsy recommended) ------------------------------------------------------------------------------------------------------------------------------------------------------- Current treatment: Cycle 2 dose dense Adriamycin and Cytoxan Echocardiogram 02/20/2020: EF 60 to 65% Chemo toxicities: 1.  Acid reflux/nausea: I sent a prescription for omeprazole. 2.  Mild fatigue  Overall she tolerated the chemotherapy very well.  With ANC of 1.2 today she does not need to take neutropenic precautions.  She is planning to go to work in person.  Return to clinic in 2 weeks for cycle 3

## 2020-03-08 NOTE — Progress Notes (Signed)
Nutrition follow-up completed with patient during infusion for breast cancer. Weight improved and was documented as 136.4 pounds on April 23, increased from 134.7 pounds. Patient reports she has successfully incorporated nutrition strategies which were discussed at last encounter. She is drinking white grape juice with good success. She is using baking soda and salt water rinses. She has no new nutrition concerns.  Nutrition diagnosis: Food and nutrition related knowledge deficit improved.  Intervention: Educated patient to continue strategies for adequate calorie and protein intake for weight maintenance. Provided support and encouragement.  Monitoring, evaluation, goals: Patient will tolerate adequate calories and protein for weight maintenance.  Patient to contact RD for further questions or concerns.  **Disclaimer: This note was dictated with voice recognition software. Similar sounding words can inadvertently be transcribed and this note may contain transcription errors which may not have been corrected upon publication of note.**

## 2020-03-08 NOTE — Patient Instructions (Signed)
[facility] Discharge Instructions for Patients Receiving Chemotherapy  Today you received the following chemotherapy agents adriamycin and cytoxan  To help prevent nausea and vomiting after your treatment, we encourage you to take your nausea medication as directed   If you develop nausea and vomiting that is not controlled by your nausea medication, call the clinic.   BELOW ARE SYMPTOMS THAT SHOULD BE REPORTED IMMEDIATELY:  *FEVER GREATER THAN 100.5 F  *CHILLS WITH OR WITHOUT FEVER  NAUSEA AND VOMITING THAT IS NOT CONTROLLED WITH YOUR NAUSEA MEDICATION  *UNUSUAL SHORTNESS OF BREATH  *UNUSUAL BRUISING OR BLEEDING  TENDERNESS IN MOUTH AND THROAT WITH OR WITHOUT PRESENCE OF ULCERS  *URINARY PROBLEMS  *BOWEL PROBLEMS  UNUSUAL RASH Items with * indicate a potential emergency and should be followed up as soon as possible.  Feel free to call the clinic should you have any questions or concerns. The clinic phone number is [phone].  Please show the [place] at check-in to the Emergency Department and triage nurse.

## 2020-03-08 NOTE — Progress Notes (Signed)
Called pt to assess needs during [facility] cycle 2. Relate doing well. Denies questions or needs at this time. Encourage pt to call with concerns. Received verbal understanding.

## 2020-03-11 ENCOUNTER — Inpatient Hospital Stay: Payer: 59

## 2020-03-18 NOTE — Progress Notes (Signed)
Pharmacist Chemotherapy Monitoring - Follow Up Assessment    I verify that I have reviewed each item in the below checklist:  . Regimen for the patient is scheduled for the appropriate day and plan matches scheduled date. Marland Kitchen Appropriate non-routine labs are ordered dependent on drug ordered. . If applicable, additional medications reviewed and ordered per protocol based on lifetime cumulative doses and/or treatment regimen.   Plan for follow-up and/or issues identified: Yes  . I-vent associated with next due treatment: Yes . MD and/or nursing notified: No - notified sched.   Kennith Center, Pharm.D., CPP 03/18/2020@11 :58 AM

## 2020-03-21 NOTE — Progress Notes (Signed)
Patient Care Team: Virginia Crews, MD as PCP - General (Family Medicine) Mauro Kaufmann, RN as Oncology Nurse Navigator Rockwell Germany, RN as Oncology Nurse Navigator  DIAGNOSIS:    ICD-10-CM   1. Malignant neoplasm of upper-outer quadrant of right breast in female, estrogen receptor negative ([place])  C50.411    Z17.1     SUMMARY OF ONCOLOGIC HISTORY: Oncology History  Malignant neoplasm of upper-outer quadrant of right breast in female, estrogen receptor negative ([place])  02/02/2020 Initial Diagnosis   Right breast lump tenderness. Diagnostic mammogram and [place] on 02/01/20 showed a 2.0cm mass at the 10 o'clock position with no axillary adenopathy. 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, ER/PR negative, Ki67 80%   02/08/2020 Cancer Staging   Staging form: Breast, AJCC 8th Edition - Clinical stage from 02/08/2020: Stage IB (cT1c, cN0, cM0, G3, ER-, PR-, HER2-) - Signed by Nicholas Lose, MD on 02/08/2020   02/23/2020 -  Chemotherapy   The patient had DOXOrubicin (ADRIAMYCIN) chemo injection 98 mg, 60 mg/m2 = 98 mg, Intravenous,  Once, 2 of 4 cycles Administration: 98 mg (02/23/2020), 98 mg (03/08/2020) palonosetron (ALOXI) injection 0.25 mg, 0.25 mg, Intravenous,  Once, 2 of 8 cycles Administration: 0.25 mg (02/23/2020), 0.25 mg (03/08/2020) pegfilgrastim (NEULASTA ONPRO KIT) injection 6 mg, 6 mg, Subcutaneous, Once, 2 of 4 cycles Administration: 6 mg (02/23/2020), 6 mg (03/08/2020) CARBOplatin (PARAPLATIN) 700 mg in sodium chloride 0.9 % 250 mL chemo infusion, 700 mg (100 % of original dose 700 mg), Intravenous,  Once, 0 of 4 cycles Dose modification: 700 mg (original dose 700 mg, Cycle 5) cyclophosphamide (CYTOXAN) 980 mg in sodium chloride 0.9 % 250 mL chemo infusion, 600 mg/m2 = 980 mg, Intravenous,  Once, 2 of 4 cycles Administration: 980 mg (02/23/2020), 980 mg (03/08/2020) PACLitaxel (TAXOL) 132 mg in sodium chloride 0.9 % 250 mL chemo infusion (</= 27m/m2), 80  mg/m2 = 132 mg, Intravenous,  Once, 0 of 4 cycles fosaprepitant (EMEND) 150 mg in sodium chloride 0.9 % 145 mL IVPB, 150 mg, Intravenous,  Once, 2 of 8 cycles Administration: 150 mg (02/23/2020), 150 mg (03/08/2020)  for chemotherapy treatment.     Genetic Testing   Negative genetic testing. No pathogenic variants identified on the [facility] Common Hereditary Cancers Panel. The report date is 02/25/2020.   The Common Hereditary Cancers Panel offered by [facility] includes sequencing and/or deletion duplication testing of the following 48 genes: APC, ATM, AXIN2, BARD1, BMPR1A, BRCA1, BRCA2, BRIP1, CDH1, CDKN2A (p14ARF), CDKN2A (p16INK4a), CKD4, CHEK2, CTNNA1, DICER1, EPCAM (Deletion/duplication testing only), GREM1 (promoter region deletion/duplication testing only), KIT, MEN1, MLH1, MSH2, MSH3, MSH6, MUTYH, NBN, NF1, NHTL1, PALB2, PDGFRA, PMS2, POLD1, POLE, PTEN, RAD50, RAD51C, RAD51D, RNF43, SDHB, SDHC, SDHD, SMAD4, SMARCA4. STK11, TP53, TSC1, TSC2, and VHL.  The following genes were evaluated for sequence changes only: SDHA and HOXB13 c.251G>A variant only.     CHIEF COMPLIANT: Cycle 3Adriamycin and Cytoxan  INTERVAL HISTORY: Virginia STORTIis a 54y.o. with above-mentioned history of right breast cancer currently on neoadjuvant chemotherapywith dose dense Adriamycin and Cytoxan. She presents to the clinic todayforcycle 3.  Her biggest complaint is related to lack of taste.  She is having difficulty drinking water because of this.  She has a very bad after taste to some of the flavored waters.  Denies any nausea or vomiting or diarrhea or constipation.  She still has to take Ativan whenever she takes dexamethasone.  ALLERGIES:  has No Known Allergies.  MEDICATIONS:  Current Outpatient Medications  Medication Sig Dispense Refill  . dexamethasone (DECADRON) 4 MG tablet Take 1 tablet day after chemo and 1 tablet 2 days after chemo with food 8 tablet 0  . escitalopram (LEXAPRO) 20 MG tablet TAKE 1  TABLET (20 MG TOTAL) BY MOUTH DAILY. 90 tablet 1  . lidocaine-prilocaine (EMLA) cream Apply to affected area once 30 g 3  . LORazepam (ATIVAN) 0.5 MG tablet Take 1 tablet (0.5 mg total) by mouth at bedtime as needed (Nausea or vomiting). 30 tablet 0  . omeprazole (PRILOSEC) 40 MG capsule Take 1 capsule (40 mg total) by mouth daily. 30 capsule 3  . ondansetron (ZOFRAN) 8 MG tablet Take 1 tablet (8 mg total) by mouth 2 (two) times daily as needed. Start on the third day after chemotherapy. 30 tablet 1  . prochlorperazine (COMPAZINE) 10 MG tablet Take 1 tablet (10 mg total) by mouth every 6 (six) hours as needed (Nausea or vomiting). 30 tablet 1   No current facility-administered medications for this visit.    PHYSICAL EXAMINATION: ECOG PERFORMANCE STATUS: 1 - Symptomatic but completely ambulatory  Vitals:   03/22/20 0928  BP: 102/76  Pulse: 92  Resp: 16  Temp: 97.8 F (36.6 C)  SpO2: 100%   Filed Weights   03/22/20 0928  Weight: 137 lb 11.2 oz (62.5 kg)    LABORATORY DATA:  I have reviewed the data as listed CMP Latest Ref Rng & Units 03/08/2020 03/01/2020 02/23/2020  Glucose 70 - 99 mg/dL 112(H) 85 79  BUN 6 - 20 mg/dL '15 9 10  ' Creatinine 0.44 - 1.00 mg/dL 0.69 0.62 0.67  Sodium 135 - 145 mmol/L 142 142 143  Potassium 3.5 - 5.1 mmol/L 4.2 4.2 4.2  Chloride 98 - 111 mmol/L 107 108 110  CO2 22 - 32 mmol/L '26 25 26  ' Calcium 8.9 - 10.3 mg/dL 9.4 8.9 9.1  Total Protein 6.5 - 8.1 g/dL 6.8 6.7 6.7  Total Bilirubin 0.3 - 1.2 mg/dL <0.2(L) 0.4 0.2(L)  Alkaline Phos 38 - 126 U/L 77 76 58  AST 15 - 41 U/L 12(L) 12(L) 16  ALT 0 - 44 U/L 16 39 17    Lab Results  Component Value Date   WBC 17.9 (H) 03/22/2020   HGB 11.0 (L) 03/22/2020   HCT 33.4 (L) 03/22/2020   MCV 88.8 03/22/2020   PLT 135 (L) 03/22/2020   NEUTROABS PENDING 03/22/2020    ASSESSMENT & PLAN:  Malignant neoplasm of upper-outer quadrant of right breast in female, estrogen receptor negative ([place]) 02/02/2020:Right  breast lump tenderness. Diagnostic mammogram and [place] on 02/01/20 showed a 2.0cm mass at the 10 o'clock position with no axillary adenopathy. 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 3 indeterminate 4 and 5 mm enhancing masses (recommend MRI biopsy), single prominent right axillary lymph node (second look ultrasound and biopsy recommended) ------------------------------------------------------------------------------------------------------------------------------------------------------- Current treatment: Cycle 3dose dense Adriamycin and Cytoxan Echocardiogram 02/20/2020: EF 60 to 65% Chemo toxicities: 1.Acid reflux/nausea: Marked improvement with omeprazole. 2.Mild fatigue 3.  Lack of taste: We discussed with her about using citrus to flavor  food and water.  Overall she tolerated the chemotherapy very well.    Return to clinic in 2 weeks for cycle 4    No orders of the defined types were placed in this encounter.  The patient has a good understanding of the overall plan. she agrees with it. she will call with any problems that may develop before the next visit here.  Total time spent: 30 mins including face to face time and time spent for planning, charting and coordination of care  Nicholas Lose, MD 03/22/2020  I, Cloyde Reams Dorshimer, am acting as scribe for Dr. Nicholas Lose.  I have reviewed the above documentation for accuracy and completeness, and I agree with the above.

## 2020-03-22 ENCOUNTER — Encounter: Payer: Self-pay | Admitting: *Deleted

## 2020-03-22 ENCOUNTER — Inpatient Hospital Stay: Payer: 59 | Attending: Hematology and Oncology

## 2020-03-22 ENCOUNTER — Inpatient Hospital Stay (HOSPITAL_BASED_OUTPATIENT_CLINIC_OR_DEPARTMENT_OTHER): Payer: 59 | Admitting: Hematology and Oncology

## 2020-03-22 ENCOUNTER — Other Ambulatory Visit: Payer: Self-pay

## 2020-03-22 ENCOUNTER — Encounter: Payer: Self-pay | Admitting: Licensed Clinical Social Worker

## 2020-03-22 ENCOUNTER — Inpatient Hospital Stay: Payer: 59

## 2020-03-22 DIAGNOSIS — R5383 Other fatigue: Secondary | ICD-10-CM | POA: Diagnosis not present

## 2020-03-22 DIAGNOSIS — Z171 Estrogen receptor negative status [ER-]: Secondary | ICD-10-CM | POA: Insufficient documentation

## 2020-03-22 DIAGNOSIS — Z79899 Other long term (current) drug therapy: Secondary | ICD-10-CM | POA: Diagnosis not present

## 2020-03-22 DIAGNOSIS — T451X5A Adverse effect of antineoplastic and immunosuppressive drugs, initial encounter: Secondary | ICD-10-CM | POA: Insufficient documentation

## 2020-03-22 DIAGNOSIS — D6481 Anemia due to antineoplastic chemotherapy: Secondary | ICD-10-CM | POA: Insufficient documentation

## 2020-03-22 DIAGNOSIS — Z5189 Encounter for other specified aftercare: Secondary | ICD-10-CM | POA: Insufficient documentation

## 2020-03-22 DIAGNOSIS — R110 Nausea: Secondary | ICD-10-CM | POA: Diagnosis not present

## 2020-03-22 DIAGNOSIS — Z7952 Long term (current) use of systemic steroids: Secondary | ICD-10-CM | POA: Diagnosis not present

## 2020-03-22 DIAGNOSIS — C50411 Malignant neoplasm of upper-outer quadrant of right female breast: Secondary | ICD-10-CM

## 2020-03-22 DIAGNOSIS — K219 Gastro-esophageal reflux disease without esophagitis: Secondary | ICD-10-CM | POA: Insufficient documentation

## 2020-03-22 DIAGNOSIS — Z95828 Presence of other vascular implants and grafts: Secondary | ICD-10-CM

## 2020-03-22 DIAGNOSIS — Z5111 Encounter for antineoplastic chemotherapy: Secondary | ICD-10-CM | POA: Diagnosis not present

## 2020-03-22 LAB — CBC WITH DIFFERENTIAL (CANCER CENTER ONLY)
Abs Immature Granulocytes: 4.75 10*3/uL — ABNORMAL HIGH (ref 0.00–0.07)
Basophils Absolute: 0.1 10*3/uL (ref 0.0–0.1)
Basophils Relative: 0 %
Eosinophils Absolute: 0.1 10*3/uL (ref 0.0–0.5)
Eosinophils Relative: 1 %
HCT: 33.4 % — ABNORMAL LOW (ref 36.0–46.0)
Hemoglobin: 11 g/dL — ABNORMAL LOW (ref 12.0–15.0)
Immature Granulocytes: 27 %
Lymphocytes Relative: 9 %
Lymphs Abs: 1.6 10*3/uL (ref 0.7–4.0)
MCH: 29.3 pg (ref 26.0–34.0)
MCHC: 32.9 g/dL (ref 30.0–36.0)
MCV: 88.8 fL (ref 80.0–100.0)
Monocytes Absolute: 0.9 10*3/uL (ref 0.1–1.0)
Monocytes Relative: 5 %
Neutro Abs: 10.5 10*3/uL — ABNORMAL HIGH (ref 1.7–7.7)
Neutrophils Relative %: 58 %
Platelet Count: 135 10*3/uL — ABNORMAL LOW (ref 150–400)
RBC: 3.76 MIL/uL — ABNORMAL LOW (ref 3.87–5.11)
RDW: 13.9 % (ref 11.5–15.5)
WBC Count: 17.9 10*3/uL — ABNORMAL HIGH (ref 4.0–10.5)
nRBC: 0 % (ref 0.0–0.2)

## 2020-03-22 LAB — CMP (CANCER CENTER ONLY)
ALT: 16 U/L (ref 0–44)
AST: 15 U/L (ref 15–41)
Albumin: 3.8 g/dL (ref 3.5–5.0)
Alkaline Phosphatase: 99 U/L (ref 38–126)
Anion gap: 10 (ref 5–15)
BUN: 15 mg/dL (ref 6–20)
CO2: 25 mmol/L (ref 22–32)
Calcium: 9.1 mg/dL (ref 8.9–10.3)
Chloride: 109 mmol/L (ref 98–111)
Creatinine: 0.62 mg/dL (ref 0.44–1.00)
GFR, Est AFR Am: >60 mL/min (ref >60)
GFR, Estimated: >60 mL/min (ref >60)
Glucose, Bld: 109 mg/dL — ABNORMAL HIGH (ref 70–99)
Potassium: 4.2 mmol/L (ref 3.5–5.1)
Sodium: 144 mmol/L (ref 135–145)
Total Bilirubin: <0.2 mg/dL — ABNORMAL LOW (ref 0.3–1.2)
Total Protein: 6.6 g/dL (ref 6.5–8.1)

## 2020-03-22 MED ORDER — DOXORUBICIN HCL CHEMO IV INJECTION 2 MG/ML
60.0000 mg/m2 | Freq: Once | INTRAVENOUS | Status: AC
  Administered 2020-03-22: 98 mg via INTRAVENOUS
  Filled 2020-03-22: qty 49

## 2020-03-22 MED ORDER — SODIUM CHLORIDE 0.9 % IV SOLN
150.0000 mg | Freq: Once | INTRAVENOUS | Status: AC
  Administered 2020-03-22: 150 mg via INTRAVENOUS
  Filled 2020-03-22: qty 150

## 2020-03-22 MED ORDER — PALONOSETRON HCL INJECTION 0.25 MG/5ML
0.2500 mg | Freq: Once | INTRAVENOUS | Status: AC
  Administered 2020-03-22: 0.25 mg via INTRAVENOUS

## 2020-03-22 MED ORDER — HEPARIN SOD (PORK) LOCK FLUSH 100 UNIT/ML IV SOLN
500.0000 [IU] | Freq: Once | INTRAVENOUS | Status: AC | PRN
  Administered 2020-03-22: 13:00:00 500 [IU]
  Filled 2020-03-22: qty 5

## 2020-03-22 MED ORDER — SODIUM CHLORIDE 0.9 % IV SOLN
10.0000 mg | Freq: Once | INTRAVENOUS | Status: AC
  Administered 2020-03-22: 10 mg via INTRAVENOUS
  Filled 2020-03-22: qty 10

## 2020-03-22 MED ORDER — PALONOSETRON HCL INJECTION 0.25 MG/5ML
INTRAVENOUS | Status: AC
  Filled 2020-03-22: qty 5

## 2020-03-22 MED ORDER — SODIUM CHLORIDE 0.9% FLUSH
10.0000 mL | INTRAVENOUS | Status: DC | PRN
  Administered 2020-03-22: 10 mL
  Filled 2020-03-22: qty 10

## 2020-03-22 MED ORDER — PEGFILGRASTIM 6 MG/0.6ML ~~LOC~~ PSKT
PREFILLED_SYRINGE | SUBCUTANEOUS | Status: AC
  Filled 2020-03-22: qty 0.6

## 2020-03-22 MED ORDER — PEGFILGRASTIM 6 MG/0.6ML ~~LOC~~ PSKT
6.0000 mg | PREFILLED_SYRINGE | Freq: Once | SUBCUTANEOUS | Status: AC
  Administered 2020-03-22: 12:00:00 6 mg via SUBCUTANEOUS

## 2020-03-22 MED ORDER — SODIUM CHLORIDE 0.9 % IV SOLN
600.0000 mg/m2 | Freq: Once | INTRAVENOUS | Status: AC
  Administered 2020-03-22: 980 mg via INTRAVENOUS
  Filled 2020-03-22: qty 49

## 2020-03-22 MED ORDER — SODIUM CHLORIDE 0.9 % IV SOLN
Freq: Once | INTRAVENOUS | Status: AC
  Filled 2020-03-22: qty 250

## 2020-03-22 NOTE — Assessment & Plan Note (Signed)
02/02/2020:Right breast lump tenderness. Diagnostic mammogram and [place] on 02/01/20 showed a 2.0cm mass at the 10 o'clock position with no axillary adenopathy. 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 3 indeterminate 4 and 5 mm enhancing masses (recommend MRI biopsy), single prominent right axillary lymph node (second look ultrasound and biopsy recommended) ------------------------------------------------------------------------------------------------------------------------------------------------------- Current treatment: Cycle 3dose dense Adriamycin and Cytoxan Echocardiogram 02/20/2020: EF 60 to 65% Chemo toxicities: 1.Acid reflux/nausea: Marked improvement with omeprazole. 2.Mild fatigue  Overall she tolerated the chemotherapy very well.  She is planning to go to work in person.  Return to clinic in 2 weeks for cycle 4

## 2020-03-22 NOTE — Progress Notes (Signed)
[place] CSW Progress Note  Holiday representative met with patient in infusion to check on emotional status through treatment. Patient reports that she is doing well. No longer having sleep issues and is excited to have her last chemo tx in 2 weeks. Only issue has been metal taste in mouth which she has been dealing with with the help of the dietitians. No concerns today but would like a check-in at her next treatment.  Edwinna Areola Avrey Hyser, LCSW

## 2020-03-22 NOTE — Patient Instructions (Signed)
[facility] Discharge Instructions for Patients Receiving Chemotherapy  Today you received the following chemotherapy agents: Adriamycin and Cytoxan  To help prevent nausea and vomiting after your treatment, we encourage you to take your nausea medication as prescribed.    If you develop nausea and vomiting that is not controlled by your nausea medication, call the clinic.   BELOW ARE SYMPTOMS THAT SHOULD BE REPORTED IMMEDIATELY:  *FEVER GREATER THAN 100.5 F  *CHILLS WITH OR WITHOUT FEVER  NAUSEA AND VOMITING THAT IS NOT CONTROLLED WITH YOUR NAUSEA MEDICATION  *UNUSUAL SHORTNESS OF BREATH  *UNUSUAL BRUISING OR BLEEDING  TENDERNESS IN MOUTH AND THROAT WITH OR WITHOUT PRESENCE OF ULCERS  *URINARY PROBLEMS  *BOWEL PROBLEMS  UNUSUAL RASH Items with * indicate a potential emergency and should be followed up as soon as possible.  Feel free to call the clinic should you have any questions or concerns. The clinic phone number is [phone].  Please show the CHEMO ALERT CARD at check-in to the Emergency Department and triage nurse.   

## 2020-03-25 ENCOUNTER — Ambulatory Visit: Payer: 59

## 2020-04-03 ENCOUNTER — Encounter: Payer: Self-pay | Admitting: *Deleted

## 2020-04-04 NOTE — Progress Notes (Signed)
Patient Care Team: Virginia Crews, MD as PCP - General (Family Medicine) Mauro Kaufmann, RN as Oncology Nurse Navigator Rockwell Germany, RN as Oncology Nurse Navigator  DIAGNOSIS:    ICD-10-CM   1. Malignant neoplasm of upper-outer quadrant of right breast in female, estrogen receptor negative ([place])  C50.411    Z17.1     SUMMARY OF ONCOLOGIC HISTORY: Oncology History  Malignant neoplasm of upper-outer quadrant of right breast in female, estrogen receptor negative ([place])  02/02/2020 Initial Diagnosis   Right breast lump tenderness. Diagnostic mammogram and [place] on 02/01/20 showed a 2.0cm mass at the 10 o'clock position with no axillary adenopathy. 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, ER/PR negative, Ki67 80%   02/08/2020 Cancer Staging   Staging form: Breast, AJCC 8th Edition - Clinical stage from 02/08/2020: Stage IB (cT1c, cN0, cM0, G3, ER-, PR-, HER2-) - Signed by Nicholas Lose, MD on 02/08/2020   02/23/2020 -  Chemotherapy   The patient had DOXOrubicin (ADRIAMYCIN) chemo injection 98 mg, 60 mg/m2 = 98 mg, Intravenous,  Once, 3 of 4 cycles Administration: 98 mg (02/23/2020), 98 mg (03/08/2020), 98 mg (03/22/2020) palonosetron (ALOXI) injection 0.25 mg, 0.25 mg, Intravenous,  Once, 3 of 8 cycles Administration: 0.25 mg (02/23/2020), 0.25 mg (03/08/2020), 0.25 mg (03/22/2020) pegfilgrastim (NEULASTA ONPRO KIT) injection 6 mg, 6 mg, Subcutaneous, Once, 3 of 4 cycles Administration: 6 mg (02/23/2020), 6 mg (03/08/2020), 6 mg (03/22/2020) CARBOplatin (PARAPLATIN) 700 mg in sodium chloride 0.9 % 250 mL chemo infusion, 700 mg (100 % of original dose 700 mg), Intravenous,  Once, 0 of 4 cycles Dose modification: 700 mg (original dose 700 mg, Cycle 5) cyclophosphamide (CYTOXAN) 980 mg in sodium chloride 0.9 % 250 mL chemo infusion, 600 mg/m2 = 980 mg, Intravenous,  Once, 3 of 4 cycles Administration: 980 mg (02/23/2020), 980 mg (03/08/2020), 980 mg (03/22/2020) PACLitaxel (TAXOL)  132 mg in sodium chloride 0.9 % 250 mL chemo infusion (</= 29m/m2), 80 mg/m2 = 132 mg, Intravenous,  Once, 0 of 4 cycles fosaprepitant (EMEND) 150 mg in sodium chloride 0.9 % 145 mL IVPB, 150 mg, Intravenous,  Once, 3 of 8 cycles Administration: 150 mg (02/23/2020), 150 mg (03/08/2020), 150 mg (03/22/2020)  for chemotherapy treatment.     Genetic Testing   Negative genetic testing. No pathogenic variants identified on the [facility] Common Hereditary Cancers Panel. The report date is 02/25/2020.   The Common Hereditary Cancers Panel offered by [facility] includes sequencing and/or deletion duplication testing of the following 48 genes: APC, ATM, AXIN2, BARD1, BMPR1A, BRCA1, BRCA2, BRIP1, CDH1, CDKN2A (p14ARF), CDKN2A (p16INK4a), CKD4, CHEK2, CTNNA1, DICER1, EPCAM (Deletion/duplication testing only), GREM1 (promoter region deletion/duplication testing only), KIT, MEN1, MLH1, MSH2, MSH3, MSH6, MUTYH, NBN, NF1, NHTL1, PALB2, PDGFRA, PMS2, POLD1, POLE, PTEN, RAD50, RAD51C, RAD51D, RNF43, SDHB, SDHC, SDHD, SMAD4, SMARCA4. STK11, TP53, TSC1, TSC2, and VHL.  The following genes were evaluated for sequence changes only: SDHA and HOXB13 c.251G>A variant only.     CHIEF COMPLIANT: Cycle4Adriamycin and Cytoxan  INTERVAL HISTORY: SSHALAMAR PLOURDEis a 54y.o. with above-mentioned history of right breast cancer currently on neoadjuvant chemotherapywith dose dense Adriamycin and Cytoxan. She presents to the clinic todayforcycle4.   She has been feeling fairly poorly over the past couple of weeks.  She has had a metallic taste in the mouth which does not appear to relieve.  She is not able to taste anything having a hard time with even drinking water.  She did not  throw up.  She is extremely tired.  ALLERGIES:  has No Known Allergies.  MEDICATIONS:  Current Outpatient Medications  Medication Sig Dispense Refill  . dexamethasone (DECADRON) 4 MG tablet Take 1 tablet day after chemo and 1 tablet 2 days after chemo  with food 8 tablet 0  . escitalopram (LEXAPRO) 20 MG tablet TAKE 1 TABLET (20 MG TOTAL) BY MOUTH DAILY. 90 tablet 1  . lidocaine-prilocaine (EMLA) cream Apply to affected area once 30 g 3  . LORazepam (ATIVAN) 0.5 MG tablet Take 1 tablet (0.5 mg total) by mouth at bedtime as needed (Nausea or vomiting). 30 tablet 0  . omeprazole (PRILOSEC) 40 MG capsule Take 1 capsule (40 mg total) by mouth daily. 30 capsule 3  . ondansetron (ZOFRAN) 8 MG tablet Take 1 tablet (8 mg total) by mouth 2 (two) times daily as needed. Start on the third day after chemotherapy. 30 tablet 1  . prochlorperazine (COMPAZINE) 10 MG tablet Take 1 tablet (10 mg total) by mouth every 6 (six) hours as needed (Nausea or vomiting). 30 tablet 1   No current facility-administered medications for this visit.    PHYSICAL EXAMINATION: ECOG PERFORMANCE STATUS: 2 - Symptomatic, <50% confined to bed  Vitals:   04/05/20 1101  BP: 111/84  Pulse: 93  Resp: 18  Temp: 98.7 F (37.1 C)  SpO2: 98%   Filed Weights   04/05/20 1101  Weight: 135 lb 1.6 oz (61.3 kg)    LABORATORY DATA:  I have reviewed the data as listed CMP Latest Ref Rng & Units 03/22/2020 03/08/2020 03/01/2020  Glucose 70 - 99 mg/dL 109(H) 112(H) 85  BUN 6 - 20 mg/dL '15 15 9  ' Creatinine 0.44 - 1.00 mg/dL 0.62 0.69 0.62  Sodium 135 - 145 mmol/L 144 142 142  Potassium 3.5 - 5.1 mmol/L 4.2 4.2 4.2  Chloride 98 - 111 mmol/L 109 107 108  CO2 22 - 32 mmol/L '25 26 25  ' Calcium 8.9 - 10.3 mg/dL 9.1 9.4 8.9  Total Protein 6.5 - 8.1 g/dL 6.6 6.8 6.7  Total Bilirubin 0.3 - 1.2 mg/dL <0.2(L) <0.2(L) 0.4  Alkaline Phos 38 - 126 U/L 99 77 76  AST 15 - 41 U/L 15 12(L) 12(L)  ALT 0 - 44 U/L 16 16 39    Lab Results  Component Value Date   WBC 9.9 04/05/2020   HGB 10.7 (L) 04/05/2020   HCT 32.3 (L) 04/05/2020   MCV 88.3 04/05/2020   PLT 159 04/05/2020   NEUTROABS PENDING 04/05/2020    ASSESSMENT & PLAN:  Malignant neoplasm of upper-outer quadrant of right breast in  female, estrogen receptor negative ([place]) 02/02/2020:Right breast lump tenderness. Diagnostic mammogram and [place] on 02/01/20 showed a 2.0cm mass at the 10 o'clock position with no axillary adenopathy. 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 3 indeterminate 4 and 5 mm enhancing masses (recommend MRI biopsy), single prominent right axillary lymph node (second look ultrasound and biopsy recommended) ------------------------------------------------------------------------------------------------------------------------------------------------------- Current treatment: Cycle4dose dense Adriamycin and Cytoxan Echocardiogram 02/20/2020: EF 60 to 65% Chemo toxicities: 1.Acid reflux/nausea:Marked improvement with omeprazole. 2.Severe fatigue 3.  Lack of taste: Metallic taste making it very difficult for her to eat or drink. 4.  Chemotherapy-induced anemia  We had lengthy conversation about how many cycles of Taxol are going to be necessary.  Patient is of the opinion that she wants to stop after 8 rounds of Taxol.  I discussed with her that we will make the final decision as things get closer to that date.  Return to clinic in2 weeks for cycle 1 of Taxol with carboplatin    No orders of the defined types were placed in this encounter.  The patient has a good understanding of the overall plan. she agrees with it. she will call with any problems that may develop before the next visit here.  Total time spent: 30 mins  including face to face time and time spent for planning, charting and coordination of care  Nicholas Lose, MD 04/05/2020  I, Cloyde Reams Dorshimer, am acting as scribe for Dr. Nicholas Lose.  I have reviewed the above documentation for accuracy and completeness, and I agree with the above.

## 2020-04-05 ENCOUNTER — Inpatient Hospital Stay: Payer: 59

## 2020-04-05 ENCOUNTER — Other Ambulatory Visit: Payer: Self-pay

## 2020-04-05 ENCOUNTER — Encounter: Payer: Self-pay | Admitting: Licensed Clinical Social Worker

## 2020-04-05 ENCOUNTER — Encounter: Payer: Self-pay | Admitting: *Deleted

## 2020-04-05 ENCOUNTER — Inpatient Hospital Stay (HOSPITAL_BASED_OUTPATIENT_CLINIC_OR_DEPARTMENT_OTHER): Payer: 59 | Admitting: Hematology and Oncology

## 2020-04-05 DIAGNOSIS — C50411 Malignant neoplasm of upper-outer quadrant of right female breast: Secondary | ICD-10-CM

## 2020-04-05 DIAGNOSIS — T451X5A Adverse effect of antineoplastic and immunosuppressive drugs, initial encounter: Secondary | ICD-10-CM | POA: Diagnosis not present

## 2020-04-05 DIAGNOSIS — Z5111 Encounter for antineoplastic chemotherapy: Secondary | ICD-10-CM | POA: Diagnosis not present

## 2020-04-05 DIAGNOSIS — Z171 Estrogen receptor negative status [ER-]: Secondary | ICD-10-CM | POA: Diagnosis not present

## 2020-04-05 DIAGNOSIS — Z5189 Encounter for other specified aftercare: Secondary | ICD-10-CM | POA: Diagnosis not present

## 2020-04-05 DIAGNOSIS — R110 Nausea: Secondary | ICD-10-CM | POA: Diagnosis not present

## 2020-04-05 DIAGNOSIS — R5383 Other fatigue: Secondary | ICD-10-CM | POA: Diagnosis not present

## 2020-04-05 DIAGNOSIS — D6481 Anemia due to antineoplastic chemotherapy: Secondary | ICD-10-CM | POA: Diagnosis not present

## 2020-04-05 DIAGNOSIS — K219 Gastro-esophageal reflux disease without esophagitis: Secondary | ICD-10-CM | POA: Diagnosis not present

## 2020-04-05 DIAGNOSIS — Z95828 Presence of other vascular implants and grafts: Secondary | ICD-10-CM

## 2020-04-05 LAB — CBC WITH DIFFERENTIAL (CANCER CENTER ONLY)
Abs Immature Granulocytes: 1.33 10*3/uL — ABNORMAL HIGH (ref 0.00–0.07)
Basophils Absolute: 0.2 10*3/uL — ABNORMAL HIGH (ref 0.0–0.1)
Basophils Relative: 2 %
Eosinophils Absolute: 0.1 10*3/uL (ref 0.0–0.5)
Eosinophils Relative: 1 %
HCT: 32.3 % — ABNORMAL LOW (ref 36.0–46.0)
Hemoglobin: 10.7 g/dL — ABNORMAL LOW (ref 12.0–15.0)
Immature Granulocytes: 14 %
Lymphocytes Relative: 12 %
Lymphs Abs: 1.2 10*3/uL (ref 0.7–4.0)
MCH: 29.2 pg (ref 26.0–34.0)
MCHC: 33.1 g/dL (ref 30.0–36.0)
MCV: 88.3 fL (ref 80.0–100.0)
Monocytes Absolute: 1 10*3/uL (ref 0.1–1.0)
Monocytes Relative: 10 %
Neutro Abs: 6.1 10*3/uL (ref 1.7–7.7)
Neutrophils Relative %: 61 %
Platelet Count: 159 10*3/uL (ref 150–400)
RBC: 3.66 MIL/uL — ABNORMAL LOW (ref 3.87–5.11)
RDW: 16 % — ABNORMAL HIGH (ref 11.5–15.5)
WBC Count: 9.9 10*3/uL (ref 4.0–10.5)
nRBC: 0.2 % (ref 0.0–0.2)

## 2020-04-05 LAB — CMP (CANCER CENTER ONLY)
ALT: 12 U/L (ref 0–44)
AST: 11 U/L — ABNORMAL LOW (ref 15–41)
Albumin: 3.8 g/dL (ref 3.5–5.0)
Alkaline Phosphatase: 88 U/L (ref 38–126)
Anion gap: 7 (ref 5–15)
BUN: 9 mg/dL (ref 6–20)
CO2: 29 mmol/L (ref 22–32)
Calcium: 9.1 mg/dL (ref 8.9–10.3)
Chloride: 107 mmol/L (ref 98–111)
Creatinine: 0.64 mg/dL (ref 0.44–1.00)
GFR, Est AFR Am: >60 mL/min (ref >60)
GFR, Estimated: >60 mL/min (ref >60)
Glucose, Bld: 83 mg/dL (ref 70–99)
Potassium: 4.2 mmol/L (ref 3.5–5.1)
Sodium: 143 mmol/L (ref 135–145)
Total Bilirubin: 0.2 mg/dL — ABNORMAL LOW (ref 0.3–1.2)
Total Protein: 6.5 g/dL (ref 6.5–8.1)

## 2020-04-05 MED ORDER — PALONOSETRON HCL INJECTION 0.25 MG/5ML
0.2500 mg | Freq: Once | INTRAVENOUS | Status: AC
  Administered 2020-04-05: 0.25 mg via INTRAVENOUS

## 2020-04-05 MED ORDER — SODIUM CHLORIDE 0.9% FLUSH
10.0000 mL | INTRAVENOUS | Status: DC | PRN
  Administered 2020-04-05: 10 mL
  Filled 2020-04-05: qty 10

## 2020-04-05 MED ORDER — HEPARIN SOD (PORK) LOCK FLUSH 100 UNIT/ML IV SOLN
500.0000 [IU] | Freq: Once | INTRAVENOUS | Status: AC | PRN
  Administered 2020-04-05: 500 [IU]
  Filled 2020-04-05: qty 5

## 2020-04-05 MED ORDER — SODIUM CHLORIDE 0.9 % IV SOLN
600.0000 mg/m2 | Freq: Once | INTRAVENOUS | Status: AC
  Administered 2020-04-05: 980 mg via INTRAVENOUS
  Filled 2020-04-05: qty 49

## 2020-04-05 MED ORDER — SODIUM CHLORIDE 0.9 % IV SOLN
10.0000 mg | Freq: Once | INTRAVENOUS | Status: AC
  Administered 2020-04-05: 10 mg via INTRAVENOUS
  Filled 2020-04-05: qty 10

## 2020-04-05 MED ORDER — PEGFILGRASTIM 6 MG/0.6ML ~~LOC~~ PSKT
6.0000 mg | PREFILLED_SYRINGE | Freq: Once | SUBCUTANEOUS | Status: AC
  Administered 2020-04-05: 6 mg via SUBCUTANEOUS

## 2020-04-05 MED ORDER — PEGFILGRASTIM 6 MG/0.6ML ~~LOC~~ PSKT
PREFILLED_SYRINGE | SUBCUTANEOUS | Status: AC
  Filled 2020-04-05: qty 0.6

## 2020-04-05 MED ORDER — SODIUM CHLORIDE 0.9 % IV SOLN
150.0000 mg | Freq: Once | INTRAVENOUS | Status: AC
  Administered 2020-04-05: 150 mg via INTRAVENOUS
  Filled 2020-04-05: qty 150

## 2020-04-05 MED ORDER — SODIUM CHLORIDE 0.9 % IV SOLN
Freq: Once | INTRAVENOUS | Status: AC
  Filled 2020-04-05: qty 250

## 2020-04-05 MED ORDER — DOXORUBICIN HCL CHEMO IV INJECTION 2 MG/ML
60.0000 mg/m2 | Freq: Once | INTRAVENOUS | Status: AC
  Administered 2020-04-05: 98 mg via INTRAVENOUS
  Filled 2020-04-05: qty 49

## 2020-04-05 MED ORDER — PALONOSETRON HCL INJECTION 0.25 MG/5ML
INTRAVENOUS | Status: AC
  Filled 2020-04-05: qty 5

## 2020-04-05 NOTE — Patient Instructions (Signed)

## 2020-04-05 NOTE — Assessment & Plan Note (Signed)
02/02/2020:Right breast lump tenderness. Diagnostic mammogram and [place] on 02/01/20 showed a 2.0cm mass at the 10 o'clock position with no axillary adenopathy. 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 3 indeterminate 4 and 5 mm enhancing masses (recommend MRI biopsy), single prominent right axillary lymph node (second look ultrasound and biopsy recommended) ------------------------------------------------------------------------------------------------------------------------------------------------------- Current treatment: Cycle4dose dense Adriamycin and Cytoxan Echocardiogram 02/20/2020: EF 60 to 65% Chemo toxicities: 1.Acid reflux/nausea:Marked improvement with omeprazole. 2.Mild fatigue 3.  Lack of taste: We discussed with her about using citrus to flavor food and water.  Overall she tolerated the chemotherapy very well.    Return to clinic in2 weeks for cycle 1 of Taxol with carboplatin

## 2020-04-05 NOTE — Patient Instructions (Signed)
[facility] Discharge Instructions for Patients Receiving Chemotherapy  Today you received the following chemotherapy agents Doxorubicin (ADRIAMYCIN) & Cyclophosphamide (CYTOXAN).  To help prevent nausea and vomiting after your treatment, we encourage you to take your nausea medication as prescribed.   If you develop nausea and vomiting that is not controlled by your nausea medication, call the clinic.   BELOW ARE SYMPTOMS THAT SHOULD BE REPORTED IMMEDIATELY:  *FEVER GREATER THAN 100.5 F  *CHILLS WITH OR WITHOUT FEVER  NAUSEA AND VOMITING THAT IS NOT CONTROLLED WITH YOUR NAUSEA MEDICATION  *UNUSUAL SHORTNESS OF BREATH  *UNUSUAL BRUISING OR BLEEDING  TENDERNESS IN MOUTH AND THROAT WITH OR WITHOUT PRESENCE OF ULCERS  *URINARY PROBLEMS  *BOWEL PROBLEMS  UNUSUAL RASH Items with * indicate a potential emergency and should be followed up as soon as possible.  Feel free to call the clinic should you have any questions or concerns. The clinic phone number is [phone].  Please show the CHEMO ALERT CARD at check-in to the Emergency Department and triage nurse.   

## 2020-04-05 NOTE — Progress Notes (Signed)
[place] CSW Progress Note  Holiday representative met with patient in infusion to provide ongoing emotional support during treatment. Virginia [name] reports doing "okay" today. She had worsening metallic taste to the point where it was hard to drink anything, so had a very hard time last Thursday. She is maintaining her positive mindset by relying on God and on looking forward to milestones (like today being her last infusion with this chemo before starting taxol).   She is still receiving excellent support from her family, friends, and Medical laboratory scientific officer. She has been able to work from home consistently as well which has lessened her stress over insurance and finances. CSW provided support and encouragement and commended [facility] for utilizing her internal and external resources.  CSW will continue to provide support through check-ins every few weeks during treatment.   Edwinna Areola Erielle Gawronski , LCSW

## 2020-04-08 ENCOUNTER — Ambulatory Visit: Payer: 59

## 2020-04-08 ENCOUNTER — Telehealth: Payer: Self-pay | Admitting: Hematology and Oncology

## 2020-04-08 NOTE — Telephone Encounter (Signed)
No LOS on 5/21.

## 2020-04-11 ENCOUNTER — Encounter: Payer: Self-pay | Admitting: Plastic Surgery

## 2020-04-11 ENCOUNTER — Encounter: Payer: Self-pay | Admitting: *Deleted

## 2020-04-12 DIAGNOSIS — Z171 Estrogen receptor negative status [ER-]: Secondary | ICD-10-CM | POA: Diagnosis not present

## 2020-04-12 DIAGNOSIS — C50411 Malignant neoplasm of upper-outer quadrant of right female breast: Secondary | ICD-10-CM | POA: Diagnosis not present

## 2020-04-18 ENCOUNTER — Encounter: Payer: Self-pay | Admitting: Family Medicine

## 2020-04-18 MED FILL — OMEPRAZOLE 40 MG CPDR: 40 | 30 days supply | Qty: 30 | Fill #1

## 2020-04-18 MED FILL — ESCITALOPRAM 20 MG TABLET: 20 | 90 days supply | Qty: 90 | Fill #1

## 2020-04-21 NOTE — Progress Notes (Signed)
Patient Care Team: Virginia Crews, MD as PCP - General (Family Medicine) Mauro Kaufmann, RN as Oncology Nurse Navigator Rockwell Germany, RN as Oncology Nurse Navigator  DIAGNOSIS:    ICD-10-CM   1. Malignant neoplasm of upper-outer quadrant of right breast in female, estrogen receptor negative ([place])  C50.411    Z17.1     SUMMARY OF ONCOLOGIC HISTORY: Oncology History  Malignant neoplasm of upper-outer quadrant of right breast in female, estrogen receptor negative ([place])  02/02/2020 Initial Diagnosis   Right breast lump tenderness. Diagnostic mammogram and [place] on 02/01/20 showed a 2.0cm mass at the 10 o'clock position with no axillary adenopathy. 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, ER/PR negative, Ki67 80%   02/08/2020 Cancer Staging   Staging form: Breast, AJCC 8th Edition - Clinical stage from 02/08/2020: Stage IB (cT1c, cN0, cM0, G3, ER-, PR-, HER2-) - Signed by Nicholas Lose, MD on 02/08/2020   02/23/2020 -  Chemotherapy   The patient had DOXOrubicin (ADRIAMYCIN) chemo injection 98 mg, 60 mg/m2 = 98 mg, Intravenous,  Once, 4 of 4 cycles Administration: 98 mg (02/23/2020), 98 mg (03/08/2020), 98 mg (03/22/2020), 98 mg (04/05/2020) palonosetron (ALOXI) injection 0.25 mg, 0.25 mg, Intravenous,  Once, 4 of 8 cycles Administration: 0.25 mg (02/23/2020), 0.25 mg (03/08/2020), 0.25 mg (03/22/2020), 0.25 mg (04/05/2020) pegfilgrastim (NEULASTA ONPRO KIT) injection 6 mg, 6 mg, Subcutaneous, Once, 4 of 4 cycles Administration: 6 mg (02/23/2020), 6 mg (03/08/2020), 6 mg (03/22/2020), 6 mg (04/05/2020) CARBOplatin (PARAPLATIN) 700 mg in sodium chloride 0.9 % 250 mL chemo infusion, 700 mg (100 % of original dose 700 mg), Intravenous,  Once, 0 of 4 cycles Dose modification: 700 mg (original dose 700 mg, Cycle 5) cyclophosphamide (CYTOXAN) 980 mg in sodium chloride 0.9 % 250 mL chemo infusion, 600 mg/m2 = 980 mg, Intravenous,  Once, 4 of 4 cycles Administration: 980 mg (02/23/2020),  980 mg (03/08/2020), 980 mg (03/22/2020), 980 mg (04/05/2020) PACLitaxel (TAXOL) 132 mg in sodium chloride 0.9 % 250 mL chemo infusion (</= 65m/m2), 80 mg/m2 = 132 mg, Intravenous,  Once, 0 of 4 cycles fosaprepitant (EMEND) 150 mg in sodium chloride 0.9 % 145 mL IVPB, 150 mg, Intravenous,  Once, 4 of 8 cycles Administration: 150 mg (02/23/2020), 150 mg (03/08/2020), 150 mg (03/22/2020), 150 mg (04/05/2020)  for chemotherapy treatment.     Genetic Testing   Negative genetic testing. No pathogenic variants identified on the [facility] Common Hereditary Cancers Panel. The report date is 02/25/2020.   The Common Hereditary Cancers Panel offered by [facility] includes sequencing and/or deletion duplication testing of the following 48 genes: APC, ATM, AXIN2, BARD1, BMPR1A, BRCA1, BRCA2, BRIP1, CDH1, CDKN2A (p14ARF), CDKN2A (p16INK4a), CKD4, CHEK2, CTNNA1, DICER1, EPCAM (Deletion/duplication testing only), GREM1 (promoter region deletion/duplication testing only), KIT, MEN1, MLH1, MSH2, MSH3, MSH6, MUTYH, NBN, NF1, NHTL1, PALB2, PDGFRA, PMS2, POLD1, POLE, PTEN, RAD50, RAD51C, RAD51D, RNF43, SDHB, SDHC, SDHD, SMAD4, SMARCA4. STK11, TP53, TSC1, TSC2, and VHL.  The following genes were evaluated for sequence changes only: SDHA and HOXB13 c.251G>A variant only.     CHIEF COMPLIANT: Cycle1 Taxol Carboplatin  INTERVAL HISTORY: Virginia BORAis a 54y.o. with above-mentioned history of breast cancer currently on neoadjuvant chemotherapywith weekly Taxol and Carboplatin after completing 4 cycles of dose dense Adriamycin and Cytoxan. She presents to the clinic todayforcycle1.   She has recovered very well from the last cycle of chemotherapy.  Her metallic taste is much improved.  Nausea is also improved.  She  is able to drink orange juice fairly well without any problems.  Energy levels have improved that she was able to walk 10,000 steps on Saturday.  ALLERGIES:  has No Known Allergies.  MEDICATIONS:  Current  Outpatient Medications  Medication Sig Dispense Refill  . escitalopram (LEXAPRO) 20 MG tablet TAKE 1 TABLET (20 MG TOTAL) BY MOUTH DAILY. 90 tablet 1  . lidocaine-prilocaine (EMLA) cream Apply to affected area once 30 g 3  . LORazepam (ATIVAN) 0.5 MG tablet Take 1 tablet (0.5 mg total) by mouth at bedtime as needed (Nausea or vomiting). 30 tablet 0  . omeprazole (PRILOSEC) 40 MG capsule Take 1 capsule (40 mg total) by mouth daily. 30 capsule 3  . ondansetron (ZOFRAN) 8 MG tablet Take 1 tablet (8 mg total) by mouth 2 (two) times daily as needed. Start on the third day after chemotherapy. 30 tablet 1  . prochlorperazine (COMPAZINE) 10 MG tablet Take 1 tablet (10 mg total) by mouth every 6 (six) hours as needed (Nausea or vomiting). 30 tablet 1   No current facility-administered medications for this visit.    PHYSICAL EXAMINATION: ECOG PERFORMANCE STATUS: 1 - Symptomatic but completely ambulatory  Vitals:   04/22/20 0936  BP: 111/74  Pulse: 94  Resp: 17  Temp: 99.1 F (37.3 C)  SpO2: 99%   Filed Weights   04/22/20 0936  Weight: 135 lb 14.4 oz (61.6 kg)    LABORATORY DATA:  I have reviewed the data as listed CMP Latest Ref Rng & Units 04/05/2020 03/22/2020 03/08/2020  Glucose 70 - 99 mg/dL 83 109(H) 112(H)  BUN 6 - 20 mg/dL _0 Creatinine 0.44 - 1.00 mg/dL 0.64 0.62 0.69  Sodium 135 - 145 mmol/L 143 144 142  Potassium 3.5 - 5.1 mmol/L 4.2 4.2 4.2  Chloride 98 - 111 mmol/L 107 109 107  CO2 22 - 32 mmol/L _1 Calcium 8.9 - 10.3 mg/dL 9.1 9.1 9.4  Total Protein 6.5 - 8.1 g/dL 6.5 6.6 6.8  Total Bilirubin 0.3 - 1.2 mg/dL 0.2(L) <0.2(L) <0.2(L)  Alkaline Phos 38 - 126 U/L 88 99 77  AST 15 - 41 U/L 11(L) 15 12(L)  ALT 0 - 44 U/L _2 Lab Results  Component Value Date   WBC 6.6 04/22/2020   HGB 10.4 (L) 04/22/2020   HCT 31.6 (L) 04/22/2020   MCV 90.5 04/22/2020   PLT 143 (L) 04/22/2020   NEUTROABS PENDING 04/22/2020    ASSESSMENT & PLAN:  Malignant  neoplasm of upper-outer quadrant of right breast in female, estrogen receptor negative ([place]) 02/02/2020:Right breast lump tenderness. Diagnostic mammogram and [place] on 02/01/20 showed a 2.0cm mass at the 10 o'clock position with no axillary adenopathy. 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 3 indeterminate 4 and 5 mm enhancing masses (recommend MRI biopsy), single prominent right axillary lymph node (second look ultrasound and biopsy recommended) ------------------------------------------------------------------------------------------------------------------------------------------------------- Current treatment: Completed 4 cycles ofdose dense Adriamycin and Cytoxan, today cycle 1 Taxol with carboplatin Echocardiogram 02/20/2020: EF 60 to 65% Chemo toxicities: 1.Acid reflux/nausea:Marked improvement with omeprazole. 2.Severe  fatigue 3.Lack of taste: Metallic taste much improved 4.  Chemotherapy-induced anemia: Hemoglobin is 10.4  We had lengthy conversation about how many cycles of Taxol are going to be necessary.  Patient is of the opinion that she wants to stop after 8 rounds of Taxol.  I discussed with her that we will make the final decision as things get closer to that date.  We will set her up for further treatments with Taxol starting 05/03/2020.    No orders of the defined types were placed in this encounter.  The patient has a good understanding of the overall plan. she agrees with it. she will call with  any problems that may develop before the next visit here.  Total time spent: 30 mins including face to face time and time spent for planning, charting and coordination of care  Nicholas Lose, MD 04/22/2020  I, Cloyde Reams Dorshimer, am acting as scribe for Dr. Nicholas Lose.  I have reviewed the above documentation for accuracy and completeness, and I agree with the above.

## 2020-04-22 ENCOUNTER — Inpatient Hospital Stay: Payer: 59

## 2020-04-22 ENCOUNTER — Inpatient Hospital Stay: Payer: 59 | Attending: Hematology and Oncology | Admitting: Hematology and Oncology

## 2020-04-22 ENCOUNTER — Other Ambulatory Visit: Payer: Self-pay

## 2020-04-22 ENCOUNTER — Encounter: Payer: Self-pay | Admitting: *Deleted

## 2020-04-22 ENCOUNTER — Ambulatory Visit: Payer: 59

## 2020-04-22 VITALS — BP 107/78 | HR 73 | Temp 99.1°F | Resp 18

## 2020-04-22 DIAGNOSIS — Z79899 Other long term (current) drug therapy: Secondary | ICD-10-CM | POA: Insufficient documentation

## 2020-04-22 DIAGNOSIS — C50411 Malignant neoplasm of upper-outer quadrant of right female breast: Secondary | ICD-10-CM

## 2020-04-22 DIAGNOSIS — Z171 Estrogen receptor negative status [ER-]: Secondary | ICD-10-CM

## 2020-04-22 DIAGNOSIS — T451X5A Adverse effect of antineoplastic and immunosuppressive drugs, initial encounter: Secondary | ICD-10-CM | POA: Insufficient documentation

## 2020-04-22 DIAGNOSIS — R5383 Other fatigue: Secondary | ICD-10-CM | POA: Diagnosis not present

## 2020-04-22 DIAGNOSIS — F419 Anxiety disorder, unspecified: Secondary | ICD-10-CM | POA: Diagnosis not present

## 2020-04-22 DIAGNOSIS — D696 Thrombocytopenia, unspecified: Secondary | ICD-10-CM | POA: Insufficient documentation

## 2020-04-22 DIAGNOSIS — D6481 Anemia due to antineoplastic chemotherapy: Secondary | ICD-10-CM | POA: Insufficient documentation

## 2020-04-22 DIAGNOSIS — R110 Nausea: Secondary | ICD-10-CM | POA: Insufficient documentation

## 2020-04-22 DIAGNOSIS — Z5111 Encounter for antineoplastic chemotherapy: Secondary | ICD-10-CM | POA: Insufficient documentation

## 2020-04-22 LAB — CBC WITH DIFFERENTIAL (CANCER CENTER ONLY)
Abs Immature Granulocytes: 0.46 10*3/uL — ABNORMAL HIGH (ref 0.00–0.07)
Basophils Absolute: 0.1 10*3/uL (ref 0.0–0.1)
Basophils Relative: 2 %
Eosinophils Absolute: 0.1 10*3/uL (ref 0.0–0.5)
Eosinophils Relative: 1 %
HCT: 31.6 % — ABNORMAL LOW (ref 36.0–46.0)
Hemoglobin: 10.4 g/dL — ABNORMAL LOW (ref 12.0–15.0)
Immature Granulocytes: 7 %
Lymphocytes Relative: 12 %
Lymphs Abs: 0.8 10*3/uL (ref 0.7–4.0)
MCH: 29.8 pg (ref 26.0–34.0)
MCHC: 32.9 g/dL (ref 30.0–36.0)
MCV: 90.5 fL (ref 80.0–100.0)
Monocytes Absolute: 0.9 10*3/uL (ref 0.1–1.0)
Monocytes Relative: 13 %
Neutro Abs: 4.3 10*3/uL (ref 1.7–7.7)
Neutrophils Relative %: 65 %
Platelet Count: 143 10*3/uL — ABNORMAL LOW (ref 150–400)
RBC: 3.49 MIL/uL — ABNORMAL LOW (ref 3.87–5.11)
RDW: 17.7 % — ABNORMAL HIGH (ref 11.5–15.5)
WBC Count: 6.6 10*3/uL (ref 4.0–10.5)
nRBC: 0 % (ref 0.0–0.2)

## 2020-04-22 LAB — CMP (CANCER CENTER ONLY)
ALT: 18 U/L (ref 0–44)
AST: 16 U/L (ref 15–41)
Albumin: 3.9 g/dL (ref 3.5–5.0)
Alkaline Phosphatase: 70 U/L (ref 38–126)
Anion gap: 9 (ref 5–15)
BUN: 12 mg/dL (ref 6–20)
CO2: 23 mmol/L (ref 22–32)
Calcium: 9.3 mg/dL (ref 8.9–10.3)
Chloride: 107 mmol/L (ref 98–111)
Creatinine: 0.65 mg/dL (ref 0.44–1.00)
GFR, Est AFR Am: >60 mL/min (ref >60)
GFR, Estimated: >60 mL/min (ref >60)
Glucose, Bld: 81 mg/dL (ref 70–99)
Potassium: 4.6 mmol/L (ref 3.5–5.1)
Sodium: 139 mmol/L (ref 135–145)
Total Bilirubin: 0.4 mg/dL (ref 0.3–1.2)
Total Protein: 6.5 g/dL (ref 6.5–8.1)

## 2020-04-22 MED ORDER — SODIUM CHLORIDE 0.9 % IV SOLN
630.0000 mg | Freq: Once | INTRAVENOUS | Status: AC
  Administered 2020-04-22: 630 mg via INTRAVENOUS
  Filled 2020-04-22: qty 63

## 2020-04-22 MED ORDER — SODIUM CHLORIDE 0.9% FLUSH
10.0000 mL | INTRAVENOUS | Status: DC | PRN
  Administered 2020-04-22: 10 mL
  Filled 2020-04-22: qty 10

## 2020-04-22 MED ORDER — PALONOSETRON HCL INJECTION 0.25 MG/5ML
0.2500 mg | Freq: Once | INTRAVENOUS | Status: AC
  Administered 2020-04-22: 0.25 mg via INTRAVENOUS

## 2020-04-22 MED ORDER — SODIUM CHLORIDE 0.9 % IV SOLN
150.0000 mg | Freq: Once | INTRAVENOUS | Status: AC
  Administered 2020-04-22: 150 mg via INTRAVENOUS
  Filled 2020-04-22: qty 150

## 2020-04-22 MED ORDER — SODIUM CHLORIDE 0.9 % IV SOLN
80.0000 mg/m2 | Freq: Once | INTRAVENOUS | Status: AC
  Administered 2020-04-22: 132 mg via INTRAVENOUS
  Filled 2020-04-22: qty 22

## 2020-04-22 MED ORDER — SODIUM CHLORIDE 0.9 % IV SOLN
10.0000 mg | Freq: Once | INTRAVENOUS | Status: AC
  Administered 2020-04-22: 10 mg via INTRAVENOUS
  Filled 2020-04-22: qty 10

## 2020-04-22 MED ORDER — FAMOTIDINE IN NACL 20-0.9 MG/50ML-% IV SOLN
INTRAVENOUS | Status: AC
  Filled 2020-04-22: qty 50

## 2020-04-22 MED ORDER — PALONOSETRON HCL INJECTION 0.25 MG/5ML
INTRAVENOUS | Status: AC
  Filled 2020-04-22: qty 5

## 2020-04-22 MED ORDER — HEPARIN SOD (PORK) LOCK FLUSH 100 UNIT/ML IV SOLN
500.0000 [IU] | Freq: Once | INTRAVENOUS | Status: AC | PRN
  Administered 2020-04-22: 500 [IU]
  Filled 2020-04-22: qty 5

## 2020-04-22 MED ORDER — SODIUM CHLORIDE 0.9 % IV SOLN: 700.0000 mg | Freq: Once | INTRAVENOUS | Status: DC

## 2020-04-22 MED ORDER — DIPHENHYDRAMINE HCL 50 MG/ML IJ SOLN
INTRAMUSCULAR | Status: AC
  Filled 2020-04-22: qty 1

## 2020-04-22 MED ORDER — SODIUM CHLORIDE 0.9 % IV SOLN
Freq: Once | INTRAVENOUS | Status: AC
  Filled 2020-04-22: qty 250

## 2020-04-22 MED ORDER — FAMOTIDINE IN NACL 20-0.9 MG/50ML-% IV SOLN
20.0000 mg | Freq: Once | INTRAVENOUS | Status: AC
  Administered 2020-04-22: 20 mg via INTRAVENOUS

## 2020-04-22 MED ORDER — DIPHENHYDRAMINE HCL 50 MG/ML IJ SOLN
50.0000 mg | Freq: Once | INTRAMUSCULAR | Status: AC
  Administered 2020-04-22: 50 mg via INTRAVENOUS

## 2020-04-22 NOTE — Assessment & Plan Note (Signed)
02/02/2020:Right breast lump tenderness. Diagnostic mammogram and [place] on 02/01/20 showed a 2.0cm mass at the 10 o'clock position with no axillary adenopathy. 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 3 indeterminate 4 and 5 mm enhancing masses (recommend MRI biopsy), single prominent right axillary lymph node (second look ultrasound and biopsy recommended) ------------------------------------------------------------------------------------------------------------------------------------------------------- Current treatment: Completed 4 cycles ofdose dense Adriamycin and Cytoxan, today cycle 1 Taxol with carboplatin Echocardiogram 02/20/2020: EF 60 to 65% Chemo toxicities: 1.Acid reflux/nausea:Marked improvement with omeprazole. 2.Severe fatigue 3.Lack of taste: Metallic taste making it very difficult for her to eat or drink. 4.  Chemotherapy-induced anemia  We had lengthy conversation about how many cycles of Taxol are going to be necessary.  Patient is of the opinion that she wants to stop after 8 rounds of Taxol.  I discussed with her that we will make the final decision as things get closer to that date.  Return to clinic in1 week for toxicity check

## 2020-04-22 NOTE — Patient Instructions (Signed)
[facility] Discharge Instructions for Patients Receiving Chemotherapy  Today you received the following chemotherapy agents Paclitaxel (Taxol) and Carboplatin.  To help prevent nausea and vomiting after your treatment, we encourage you to take your nausea medication as directed by your MD.   If you develop nausea and vomiting that is not controlled by your nausea medication, call the clinic.   BELOW ARE SYMPTOMS THAT SHOULD BE REPORTED IMMEDIATELY:  *FEVER GREATER THAN 100.5 F  *CHILLS WITH OR WITHOUT FEVER  NAUSEA AND VOMITING THAT IS NOT CONTROLLED WITH YOUR NAUSEA MEDICATION  *UNUSUAL SHORTNESS OF BREATH  *UNUSUAL BRUISING OR BLEEDING  TENDERNESS IN MOUTH AND THROAT WITH OR WITHOUT PRESENCE OF ULCERS  *URINARY PROBLEMS  *BOWEL PROBLEMS  UNUSUAL RASH Items with * indicate a potential emergency and should be followed up as soon as possible.  Feel free to call the clinic should you have any questions or concerns. The clinic phone number is [phone].  Please show the CHEMO ALERT CARD at check-in to the Emergency Department and triage nurse.  Paclitaxel injection What is this medicine? PACLITAXEL (PAK li TAX el) is a chemotherapy drug. It targets fast dividing cells, like cancer cells, and causes these cells to die. This medicine is used to treat ovarian cancer, breast cancer, lung cancer, Kaposi's sarcoma, and other cancers. This medicine may be used for other purposes; ask your health care provider or pharmacist if you have questions. COMMON BRAND NAME(S): Onxol, Taxol What should I tell my health care provider before I take this medicine? They need to know if you have any of these conditions:  history of irregular heartbeat  liver disease  low blood counts, like low white cell, platelet, or red cell counts  lung or breathing disease, like asthma  tingling of the fingers or toes, or other nerve disorder  an unusual or allergic reaction to  paclitaxel, alcohol, polyoxyethylated castor oil, other chemotherapy, other medicines, foods, dyes, or preservatives  pregnant or trying to get pregnant  breast-feeding How should I use this medicine? This drug is given as an infusion into a vein. It is administered in a hospital or clinic by a specially trained health care professional. Talk to your pediatrician regarding the use of this medicine in children. Special care may be needed. Overdosage: If you think you have taken too much of this medicine contact a poison control center or emergency room at once. NOTE: This medicine is only for you. Do not share this medicine with others. What if I miss a dose? It is important not to miss your dose. Call your doctor or health care professional if you are unable to keep an appointment. What may interact with this medicine? Do not take this medicine with any of the following medications:  disulfiram  metronidazole This medicine may also interact with the following medications:  antiviral medicines for hepatitis, HIV or AIDS  certain antibiotics like erythromycin and clarithromycin  certain medicines for fungal infections like ketoconazole and itraconazole  certain medicines for seizures like carbamazepine, phenobarbital, phenytoin  gemfibrozil  nefazodone  rifampin  St. John's wort This list may not describe all possible interactions. Give your health care provider a list of all the medicines, herbs, non-prescription drugs, or dietary supplements you use. Also tell them if you smoke, drink alcohol, or use illegal drugs. Some items may interact with your medicine. What should I watch for while using this medicine? Your condition will be monitored carefully while you are receiving this medicine.   You will need important blood work done while you are taking this medicine. This medicine can cause serious allergic reactions. To reduce your risk you will need to take other medicine(s)  before treatment with this medicine. If you experience allergic reactions like skin rash, itching or hives, swelling of the face, lips, or tongue, tell your doctor or health care professional right away. In some cases, you may be given additional medicines to help with side effects. Follow all directions for their use. This drug may make you feel generally unwell. This is not uncommon, as chemotherapy can affect healthy cells as well as cancer cells. Report any side effects. Continue your course of treatment even though you feel ill unless your doctor tells you to stop. Call your doctor or health care professional for advice if you get a fever, chills or sore throat, or other symptoms of a cold or flu. Do not treat yourself. This drug decreases your body's ability to fight infections. Try to avoid being around people who are sick. This medicine may increase your risk to bruise or bleed. Call your doctor or health care professional if you notice any unusual bleeding. Be careful brushing and flossing your teeth or using a toothpick because you may get an infection or bleed more easily. If you have any dental work done, tell your dentist you are receiving this medicine. Avoid taking products that contain aspirin, acetaminophen, ibuprofen, naproxen, or ketoprofen unless instructed by your doctor. These medicines may hide a fever. Do not become pregnant while taking this medicine. Women should inform their doctor if they wish to become pregnant or think they might be pregnant. There is a potential for serious side effects to an unborn child. Talk to your health care professional or pharmacist for more information. Do not breast-feed an infant while taking this medicine. Men are advised not to father a child while receiving this medicine. This product may contain alcohol. Ask your pharmacist or healthcare provider if this medicine contains alcohol. Be sure to tell all healthcare providers you are taking this  medicine. Certain medicines, like metronidazole and disulfiram, can cause an unpleasant reaction when taken with alcohol. The reaction includes flushing, headache, nausea, vomiting, sweating, and increased thirst. The reaction can last from 30 minutes to several hours. What side effects may I notice from receiving this medicine? Side effects that you should report to your doctor or health care professional as soon as possible:  allergic reactions like skin rash, itching or hives, swelling of the face, lips, or tongue  breathing problems  changes in vision  fast, irregular heartbeat  high or low blood pressure  mouth sores  pain, tingling, numbness in the hands or feet  signs of decreased platelets or bleeding - bruising, pinpoint red spots on the skin, black, tarry stools, blood in the urine  signs of decreased red blood cells - unusually weak or tired, feeling faint or lightheaded, falls  signs of infection - fever or chills, cough, sore throat, pain or difficulty passing urine  signs and symptoms of liver injury like dark yellow or brown urine; general ill feeling or flu-like symptoms; light-colored stools; loss of appetite; nausea; right upper belly pain; unusually weak or tired; yellowing of the eyes or skin  swelling of the ankles, feet, hands  unusually slow heartbeat Side effects that usually do not require medical attention (report to your doctor or health care professional if they continue or are bothersome):  diarrhea  hair loss  loss of appetite  muscle or joint pain  nausea, vomiting  pain, redness, or irritation at site where injected  tiredness This list may not describe all possible side effects. Call your doctor for medical advice about side effects. You may report side effects to FDA at 1-800-FDA-1088. Where should I keep my medicine? This drug is given in a hospital or clinic and will not be stored at home. NOTE: This sheet is a summary. It may not  cover all possible information. If you have questions about this medicine, talk to your doctor, pharmacist, or health care provider.  2020 Elsevier/Gold Standard (2017-07-06 13:14:55)  Carboplatin injection What is this medicine? CARBOPLATIN (KAR boe pla tin) is a chemotherapy drug. It targets fast dividing cells, like cancer cells, and causes these cells to die. This medicine is used to treat ovarian cancer and many other cancers. This medicine may be used for other purposes; ask your health care provider or pharmacist if you have questions. COMMON BRAND NAME(S): Paraplatin What should I tell my health care provider before I take this medicine? They need to know if you have any of these conditions:  blood disorders  hearing problems  kidney disease  recent or ongoing radiation therapy  an unusual or allergic reaction to carboplatin, cisplatin, other chemotherapy, other medicines, foods, dyes, or preservatives  pregnant or trying to get pregnant  breast-feeding How should I use this medicine? This drug is usually given as an infusion into a vein. It is administered in a hospital or clinic by a specially trained health care professional. Talk to your pediatrician regarding the use of this medicine in children. Special care may be needed. Overdosage: If you think you have taken too much of this medicine contact a poison control center or emergency room at once. NOTE: This medicine is only for you. Do not share this medicine with others. What if I miss a dose? It is important not to miss a dose. Call your doctor or health care professional if you are unable to keep an appointment. What may interact with this medicine?  medicines for seizures  medicines to increase blood counts like filgrastim, pegfilgrastim, sargramostim  some antibiotics like amikacin, gentamicin, neomycin, streptomycin, tobramycin  vaccines Talk to your doctor or health care professional before taking any of  these medicines:  acetaminophen  aspirin  ibuprofen  ketoprofen  naproxen This list may not describe all possible interactions. Give your health care provider a list of all the medicines, herbs, non-prescription drugs, or dietary supplements you use. Also tell them if you smoke, drink alcohol, or use illegal drugs. Some items may interact with your medicine. What should I watch for while using this medicine? Your condition will be monitored carefully while you are receiving this medicine. You will need important blood work done while you are taking this medicine. This drug may make you feel generally unwell. This is not uncommon, as chemotherapy can affect healthy cells as well as cancer cells. Report any side effects. Continue your course of treatment even though you feel ill unless your doctor tells you to stop. In some cases, you may be given additional medicines to help with side effects. Follow all directions for their use. Call your doctor or health care professional for advice if you get a fever, chills or sore throat, or other symptoms of a cold or flu. Do not treat yourself. This drug decreases your body's ability to fight infections. Try to avoid being around people who are sick. This medicine may increase your  risk to bruise or bleed. Call your doctor or health care professional if you notice any unusual bleeding. Be careful brushing and flossing your teeth or using a toothpick because you may get an infection or bleed more easily. If you have any dental work done, tell your dentist you are receiving this medicine. Avoid taking products that contain aspirin, acetaminophen, ibuprofen, naproxen, or ketoprofen unless instructed by your doctor. These medicines may hide a fever. Do not become pregnant while taking this medicine. Women should inform their doctor if they wish to become pregnant or think they might be pregnant. There is a potential for serious side effects to an unborn child.  Talk to your health care professional or pharmacist for more information. Do not breast-feed an infant while taking this medicine. What side effects may I notice from receiving this medicine? Side effects that you should report to your doctor or health care professional as soon as possible:  allergic reactions like skin rash, itching or hives, swelling of the face, lips, or tongue  signs of infection - fever or chills, cough, sore throat, pain or difficulty passing urine  signs of decreased platelets or bleeding - bruising, pinpoint red spots on the skin, black, tarry stools, nosebleeds  signs of decreased red blood cells - unusually weak or tired, fainting spells, lightheadedness  breathing problems  changes in hearing  changes in vision  chest pain  high blood pressure  low blood counts - This drug may decrease the number of white blood cells, red blood cells and platelets. You may be at increased risk for infections and bleeding.  nausea and vomiting  pain, swelling, redness or irritation at the injection site  pain, tingling, numbness in the hands or feet  problems with balance, talking, walking  trouble passing urine or change in the amount of urine Side effects that usually do not require medical attention (report to your doctor or health care professional if they continue or are bothersome):  hair loss  loss of appetite  metallic taste in the mouth or changes in taste This list may not describe all possible side effects. Call your doctor for medical advice about side effects. You may report side effects to FDA at 1-800-FDA-1088. Where should I keep my medicine? This drug is given in a hospital or clinic and will not be stored at home. NOTE: This sheet is a summary. It may not cover all possible information. If you have questions about this medicine, talk to your doctor, pharmacist, or health care provider.  2020 Elsevier/Gold Standard (2008-02-07 14:38:05)

## 2020-04-22 NOTE — Progress Notes (Signed)
Per Dr. Lindi Adie, carboplatin dose today will be 630mg  (AUC = 6).   Demetrius Charity, PharmD, BCPS, [place] [facility] Pharmacist Pharmacy Phone: [phone] 04/22/2020

## 2020-04-23 ENCOUNTER — Telehealth: Payer: Self-pay | Admitting: *Deleted

## 2020-05-02 ENCOUNTER — Encounter: Payer: Self-pay | Admitting: *Deleted

## 2020-05-03 ENCOUNTER — Encounter: Payer: Self-pay | Admitting: Licensed Clinical Social Worker

## 2020-05-03 ENCOUNTER — Inpatient Hospital Stay: Payer: 59

## 2020-05-03 ENCOUNTER — Other Ambulatory Visit: Payer: Self-pay

## 2020-05-03 VITALS — BP 105/72 | HR 82 | Temp 98.5°F | Resp 18

## 2020-05-03 DIAGNOSIS — C50411 Malignant neoplasm of upper-outer quadrant of right female breast: Secondary | ICD-10-CM

## 2020-05-03 DIAGNOSIS — R5383 Other fatigue: Secondary | ICD-10-CM | POA: Diagnosis not present

## 2020-05-03 DIAGNOSIS — Z95828 Presence of other vascular implants and grafts: Secondary | ICD-10-CM

## 2020-05-03 DIAGNOSIS — Z171 Estrogen receptor negative status [ER-]: Secondary | ICD-10-CM

## 2020-05-03 DIAGNOSIS — Z5111 Encounter for antineoplastic chemotherapy: Secondary | ICD-10-CM | POA: Diagnosis not present

## 2020-05-03 DIAGNOSIS — F419 Anxiety disorder, unspecified: Secondary | ICD-10-CM | POA: Diagnosis not present

## 2020-05-03 DIAGNOSIS — R110 Nausea: Secondary | ICD-10-CM | POA: Diagnosis not present

## 2020-05-03 DIAGNOSIS — D696 Thrombocytopenia, unspecified: Secondary | ICD-10-CM | POA: Diagnosis not present

## 2020-05-03 DIAGNOSIS — D6481 Anemia due to antineoplastic chemotherapy: Secondary | ICD-10-CM | POA: Diagnosis not present

## 2020-05-03 DIAGNOSIS — T451X5A Adverse effect of antineoplastic and immunosuppressive drugs, initial encounter: Secondary | ICD-10-CM | POA: Diagnosis not present

## 2020-05-03 LAB — CMP (CANCER CENTER ONLY)
ALT: 49 U/L — ABNORMAL HIGH (ref 0–44)
AST: 33 U/L (ref 15–41)
Albumin: 3.8 g/dL (ref 3.5–5.0)
Alkaline Phosphatase: 62 U/L (ref 38–126)
Anion gap: 7 (ref 5–15)
BUN: 11 mg/dL (ref 6–20)
CO2: 25 mmol/L (ref 22–32)
Calcium: 8.8 mg/dL — ABNORMAL LOW (ref 8.9–10.3)
Chloride: 108 mmol/L (ref 98–111)
Creatinine: 0.59 mg/dL (ref 0.44–1.00)
GFR, Est AFR Am: >60 mL/min (ref >60)
GFR, Estimated: >60 mL/min (ref >60)
Glucose, Bld: 69 mg/dL — ABNORMAL LOW (ref 70–99)
Potassium: 3.8 mmol/L (ref 3.5–5.1)
Sodium: 140 mmol/L (ref 135–145)
Total Bilirubin: 0.3 mg/dL (ref 0.3–1.2)
Total Protein: 6.2 g/dL — ABNORMAL LOW (ref 6.5–8.1)

## 2020-05-03 LAB — CBC WITH DIFFERENTIAL (CANCER CENTER ONLY)
Abs Immature Granulocytes: 0.02 K/uL (ref 0.00–0.07)
Basophils Absolute: 0 K/uL (ref 0.0–0.1)
Basophils Relative: 1 %
Eosinophils Absolute: 0.1 K/uL (ref 0.0–0.5)
Eosinophils Relative: 1 %
HCT: 26.1 % — ABNORMAL LOW (ref 36.0–46.0)
Hemoglobin: 8.6 g/dL — ABNORMAL LOW (ref 12.0–15.0)
Immature Granulocytes: 1 %
Lymphocytes Relative: 16 %
Lymphs Abs: 0.6 K/uL — ABNORMAL LOW (ref 0.7–4.0)
MCH: 30.8 pg (ref 26.0–34.0)
MCHC: 33 g/dL (ref 30.0–36.0)
MCV: 93.5 fL (ref 80.0–100.0)
Monocytes Absolute: 0.7 K/uL (ref 0.1–1.0)
Monocytes Relative: 18 %
Neutro Abs: 2.5 K/uL (ref 1.7–7.7)
Neutrophils Relative %: 63 %
Platelet Count: 152 K/uL (ref 150–400)
RBC: 2.79 MIL/uL — ABNORMAL LOW (ref 3.87–5.11)
RDW: 17.3 % — ABNORMAL HIGH (ref 11.5–15.5)
WBC Count: 3.9 K/uL — ABNORMAL LOW (ref 4.0–10.5)
nRBC: 0 % (ref 0.0–0.2)

## 2020-05-03 MED ORDER — SODIUM CHLORIDE 0.9% FLUSH
10.0000 mL | INTRAVENOUS | Status: DC | PRN
  Administered 2020-05-03: 10 mL
  Filled 2020-05-03: qty 10

## 2020-05-03 MED ORDER — FAMOTIDINE IN NACL 20-0.9 MG/50ML-% IV SOLN
20.0000 mg | Freq: Once | INTRAVENOUS | Status: AC
  Administered 2020-05-03: 20 mg via INTRAVENOUS

## 2020-05-03 MED ORDER — SODIUM CHLORIDE 0.9 % IV SOLN
20.0000 mg | Freq: Once | INTRAVENOUS | Status: AC
  Administered 2020-05-03: 20 mg via INTRAVENOUS
  Filled 2020-05-03: qty 20

## 2020-05-03 MED ORDER — FAMOTIDINE IN NACL 20-0.9 MG/50ML-% IV SOLN
INTRAVENOUS | Status: AC
  Filled 2020-05-03: qty 50

## 2020-05-03 MED ORDER — SODIUM CHLORIDE 0.9 % IV SOLN
Freq: Once | INTRAVENOUS | Status: AC
  Filled 2020-05-03: qty 250

## 2020-05-03 MED ORDER — HEPARIN SOD (PORK) LOCK FLUSH 100 UNIT/ML IV SOLN
500.0000 [IU] | Freq: Once | INTRAVENOUS | Status: AC | PRN
  Administered 2020-05-03: 500 [IU]
  Filled 2020-05-03: qty 5

## 2020-05-03 MED ORDER — DIPHENHYDRAMINE HCL 50 MG/ML IJ SOLN
INTRAMUSCULAR | Status: AC
  Filled 2020-05-03: qty 1

## 2020-05-03 MED ORDER — DIPHENHYDRAMINE HCL 50 MG/ML IJ SOLN
50.0000 mg | Freq: Once | INTRAMUSCULAR | Status: AC
  Administered 2020-05-03: 50 mg via INTRAVENOUS

## 2020-05-03 MED ORDER — SODIUM CHLORIDE 0.9 % IV SOLN
80.0000 mg/m2 | Freq: Once | INTRAVENOUS | Status: AC
  Administered 2020-05-03: 132 mg via INTRAVENOUS
  Filled 2020-05-03: qty 22

## 2020-05-03 NOTE — Patient Instructions (Signed)
[facility] Discharge Instructions for Patients Receiving Chemotherapy  Today you received the following chemotherapy agents:  Taxol.  To help prevent nausea and vomiting after your treatment, we encourage you to take your nausea medication as directed.   If you develop nausea and vomiting that is not controlled by your nausea medication, call the clinic.   BELOW ARE SYMPTOMS THAT SHOULD BE REPORTED IMMEDIATELY:  *FEVER GREATER THAN 100.5 F  *CHILLS WITH OR WITHOUT FEVER  NAUSEA AND VOMITING THAT IS NOT CONTROLLED WITH YOUR NAUSEA MEDICATION  *UNUSUAL SHORTNESS OF BREATH  *UNUSUAL BRUISING OR BLEEDING  TENDERNESS IN MOUTH AND THROAT WITH OR WITHOUT PRESENCE OF ULCERS  *URINARY PROBLEMS  *BOWEL PROBLEMS  UNUSUAL RASH Items with * indicate a potential emergency and should be followed up as soon as possible.  Feel free to call the clinic should you have any questions or concerns. The clinic phone number is [phone].  Please show the CHEMO ALERT CARD at check-in to the Emergency Department and triage nurse.   

## 2020-05-03 NOTE — Progress Notes (Signed)
[facility] CSW Progress Note  Clinical Social Worker met with patient in infusion for ongoing support. Patient reports doing much better since finishing first set of chemo and now starting Taxol. She has been able to work every day (other than treatment day) and her taste is slowly returning which is helping tremendously.  She is ready to finish chemo so her hair can start growing back. She was very tired today due to the benadryl, so CSW will check back in during a future treatment.     Michelle E Stoisits , LCSW 

## 2020-05-08 MED FILL — Dexamethasone Sodium Phosphate Inj 100 MG/10ML: INTRAMUSCULAR | Qty: 2 | Status: AC

## 2020-05-08 NOTE — Progress Notes (Signed)
Patient Care Team: Virginia Crews, MD as PCP - General (Family Medicine) Mauro Kaufmann, RN as Oncology Nurse Navigator Rockwell Germany, RN as Oncology Nurse Navigator  DIAGNOSIS:    ICD-10-CM   1. Malignant neoplasm of upper-outer quadrant of right breast in female, estrogen receptor negative ([place])  C50.411    Z17.1     SUMMARY OF ONCOLOGIC HISTORY: Oncology History  Malignant neoplasm of upper-outer quadrant of right breast in female, estrogen receptor negative ([place])  02/02/2020 Initial Diagnosis   Right breast lump tenderness. Diagnostic mammogram and [place] on 02/01/20 showed a 2.0cm mass at the 10 o'clock position with no axillary adenopathy. 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, ER/PR negative, Ki67 80%   02/08/2020 Cancer Staging   Staging form: Breast, AJCC 8th Edition - Clinical stage from 02/08/2020: Stage IB (cT1c, cN0, cM0, G3, ER-, PR-, HER2-) - Signed by Nicholas Lose, MD on 02/08/2020   02/23/2020 -  Chemotherapy   The patient had dexamethasone (DECADRON) 4 MG tablet, 1 of 1 cycle, Start date: 02/08/2020, End date: 04/22/2020 DOXOrubicin (ADRIAMYCIN) chemo injection 98 mg, 60 mg/m2 = 98 mg, Intravenous,  Once, 4 of 4 cycles Administration: 98 mg (02/23/2020), 98 mg (03/08/2020), 98 mg (03/22/2020), 98 mg (04/05/2020) palonosetron (ALOXI) injection 0.25 mg, 0.25 mg, Intravenous,  Once, 5 of 8 cycles Administration: 0.25 mg (02/23/2020), 0.25 mg (04/22/2020), 0.25 mg (03/08/2020), 0.25 mg (03/22/2020), 0.25 mg (04/05/2020) pegfilgrastim (NEULASTA ONPRO KIT) injection 6 mg, 6 mg, Subcutaneous, Once, 4 of 4 cycles Administration: 6 mg (02/23/2020), 6 mg (03/08/2020), 6 mg (03/22/2020), 6 mg (04/05/2020) CARBOplatin (PARAPLATIN) 630 mg in sodium chloride 0.9 % 250 mL chemo infusion, 700 mg (100 % of original dose 700 mg), Intravenous,  Once, 1 of 4 cycles Dose modification: 700 mg (original dose 700 mg, Cycle 5), 637.2 mg (original dose 700 mg, Cycle 6, Reason: Change  in SCr/CrCl) Administration: 630 mg (04/22/2020) cyclophosphamide (CYTOXAN) 980 mg in sodium chloride 0.9 % 250 mL chemo infusion, 600 mg/m2 = 980 mg, Intravenous,  Once, 4 of 4 cycles Administration: 980 mg (02/23/2020), 980 mg (03/08/2020), 980 mg (03/22/2020), 980 mg (04/05/2020) PACLitaxel (TAXOL) 132 mg in sodium chloride 0.9 % 250 mL chemo infusion (</= 62m/m2), 80 mg/m2 = 132 mg, Intravenous,  Once, 1 of 4 cycles Dose modification: 60 mg/m2 (original dose 80 mg/m2, Cycle 5, Reason: Dose not tolerated) Administration: 132 mg (04/22/2020), 132 mg (05/03/2020) fosaprepitant (EMEND) 150 mg in sodium chloride 0.9 % 145 mL IVPB, 150 mg, Intravenous,  Once, 5 of 8 cycles Administration: 150 mg (02/23/2020), 150 mg (04/22/2020), 150 mg (03/08/2020), 150 mg (03/22/2020), 150 mg (04/05/2020)  for chemotherapy treatment.     Genetic Testing   Negative genetic testing. No pathogenic variants identified on the [facility] Common Hereditary Cancers Panel. The report date is 02/25/2020.   The Common Hereditary Cancers Panel offered by [facility] includes sequencing and/or deletion duplication testing of the following 48 genes: APC, ATM, AXIN2, BARD1, BMPR1A, BRCA1, BRCA2, BRIP1, CDH1, CDKN2A (p14ARF), CDKN2A (p16INK4a), CKD4, CHEK2, CTNNA1, DICER1, EPCAM (Deletion/duplication testing only), GREM1 (promoter region deletion/duplication testing only), KIT, MEN1, MLH1, MSH2, MSH3, MSH6, MUTYH, NBN, NF1, NHTL1, PALB2, PDGFRA, PMS2, POLD1, POLE, PTEN, RAD50, RAD51C, RAD51D, RNF43, SDHB, SDHC, SDHD, SMAD4, SMARCA4. STK11, TP53, TSC1, TSC2, and VHL.  The following genes were evaluated for sequence changes only: SDHA and HOXB13 c.251G>A variant only.     CHIEF COMPLIANT: Cycle3 Taxol  INTERVAL HISTORY: SPINKIE MANGERis a 54y.o.  with above-mentioned history of breast cancer currently on neoadjuvant chemotherapywith weekly Taxol and Carboplatin after completing 4 cycles of dose dense Adriamycin and Cytoxan. She presents to the clinic  todayforcycle3.  She has tolerated Taxol extremely well with exception of anemia and low platelet counts today.  She does not have any epigastric discomfort and stopped taking Prilosec.  Does not have any nausea or vomiting.  ALLERGIES:  has No Known Allergies.  MEDICATIONS:  Current Outpatient Medications  Medication Sig Dispense Refill  . escitalopram (LEXAPRO) 20 MG tablet TAKE 1 TABLET (20 MG TOTAL) BY MOUTH DAILY. 90 tablet 1  . lidocaine-prilocaine (EMLA) cream Apply to affected area once 30 g 3   No current facility-administered medications for this visit.    PHYSICAL EXAMINATION: ECOG PERFORMANCE STATUS: 1 - Symptomatic but completely ambulatory  Vitals:   05/09/20 1243  BP: 95/66  Pulse: (!) 109  Resp: 18  Temp: 98.7 F (37.1 C)  SpO2: 100%   Filed Weights   05/09/20 1243  Weight: 137 lb 3.2 oz (62.2 kg)    LABORATORY DATA:  I have reviewed the data as listed CMP Latest Ref Rng & Units 05/03/2020 04/22/2020 04/05/2020  Glucose 70 - 99 mg/dL 69(L) 81 83  BUN 6 - 20 mg/dL '11 12 9  ' Creatinine 0.44 - 1.00 mg/dL 0.59 0.65 0.64  Sodium 135 - 145 mmol/L 140 139 143  Potassium 3.5 - 5.1 mmol/L 3.8 4.6 4.2  Chloride 98 - 111 mmol/L 108 107 107  CO2 22 - 32 mmol/L '25 23 29  ' Calcium 8.9 - 10.3 mg/dL 8.8(L) 9.3 9.1  Total Protein 6.5 - 8.1 g/dL 6.2(L) 6.5 6.5  Total Bilirubin 0.3 - 1.2 mg/dL 0.3 0.4 0.2(L)  Alkaline Phos 38 - 126 U/L 62 70 88  AST 15 - 41 U/L 33 16 11(L)  ALT 0 - 44 U/L 49(H) 18 12    Lab Results  Component Value Date   WBC 2.7 (L) 05/09/2020   HGB 9.3 (L) 05/09/2020   HCT 28.7 (L) 05/09/2020   MCV 94.4 05/09/2020   PLT 95 (L) 05/09/2020   NEUTROABS 1.4 (L) 05/09/2020    ASSESSMENT & PLAN:  Malignant neoplasm of upper-outer quadrant of right breast in female, estrogen receptor negative ([facility]) 02/02/2020:Right breast lump tenderness. Diagnostic mammogram and [place] on 02/01/20 showed a 2.0cm mass at the 10 o'clock position with no axillary adenopathy.  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 3 indeterminate 4 and 5 mm enhancing masses (recommend MRI biopsy), single prominent right axillary lymph node (second look ultrasound and biopsy recommended) ------------------------------------------------------------------------------------------------------------------------------------------------------- Current treatment: Completed 4 cycles ofdose dense Adriamycin and Cytoxan, today cycle 3 Taxol with carboplatin (q 3 weeks) Echocardiogram 02/20/2020: EF 60 to 65% Chemo toxicities: 1. Chemotherapy-induced anemia: Today's hemoglobin is 9.3 2.  Thrombocytopenia: Platelet count 95 okay to treat, reducing the dosage of Taxol. 3.  We will discontinue Benadryl. 4.  We will reduce the dosage of dexamethasone to 10 mg IV.  Anxiety issues related to coming in for infusions  Continue weekly Taxol and every 3-week carboplatin treatments.    No  orders of the defined types were placed in this encounter.  The patient has a good understanding of the overall plan. she agrees with it. she will call with any problems that may develop before the next visit here.  Total time spent: 30 mins including face to face time and time spent for planning, charting and coordination of care  Nicholas Lose, MD 05/09/2020  I, Cloyde Reams Dorshimer, am acting as scribe for Dr. Nicholas Lose.  I have reviewed the above documentation for accuracy and completeness, and I agree with the above.

## 2020-05-09 ENCOUNTER — Inpatient Hospital Stay: Payer: 59

## 2020-05-09 ENCOUNTER — Inpatient Hospital Stay (HOSPITAL_BASED_OUTPATIENT_CLINIC_OR_DEPARTMENT_OTHER): Payer: 59 | Admitting: Hematology and Oncology

## 2020-05-09 ENCOUNTER — Other Ambulatory Visit: Payer: Self-pay

## 2020-05-09 ENCOUNTER — Telehealth: Payer: Self-pay | Admitting: Hematology and Oncology

## 2020-05-09 VITALS — HR 92

## 2020-05-09 DIAGNOSIS — Z171 Estrogen receptor negative status [ER-]: Secondary | ICD-10-CM

## 2020-05-09 DIAGNOSIS — D696 Thrombocytopenia, unspecified: Secondary | ICD-10-CM | POA: Diagnosis not present

## 2020-05-09 DIAGNOSIS — Z95828 Presence of other vascular implants and grafts: Secondary | ICD-10-CM

## 2020-05-09 DIAGNOSIS — F419 Anxiety disorder, unspecified: Secondary | ICD-10-CM | POA: Diagnosis not present

## 2020-05-09 DIAGNOSIS — C50411 Malignant neoplasm of upper-outer quadrant of right female breast: Secondary | ICD-10-CM

## 2020-05-09 DIAGNOSIS — D6481 Anemia due to antineoplastic chemotherapy: Secondary | ICD-10-CM | POA: Diagnosis not present

## 2020-05-09 DIAGNOSIS — T451X5A Adverse effect of antineoplastic and immunosuppressive drugs, initial encounter: Secondary | ICD-10-CM | POA: Diagnosis not present

## 2020-05-09 DIAGNOSIS — Z5111 Encounter for antineoplastic chemotherapy: Secondary | ICD-10-CM | POA: Diagnosis not present

## 2020-05-09 DIAGNOSIS — R110 Nausea: Secondary | ICD-10-CM | POA: Diagnosis not present

## 2020-05-09 DIAGNOSIS — R5383 Other fatigue: Secondary | ICD-10-CM | POA: Diagnosis not present

## 2020-05-09 LAB — CMP (CANCER CENTER ONLY)
ALT: 56 U/L — ABNORMAL HIGH (ref 0–44)
AST: 32 U/L (ref 15–41)
Albumin: 3.9 g/dL (ref 3.5–5.0)
Alkaline Phosphatase: 68 U/L (ref 38–126)
Anion gap: 9 (ref 5–15)
BUN: 10 mg/dL (ref 6–20)
CO2: 24 mmol/L (ref 22–32)
Calcium: 9.1 mg/dL (ref 8.9–10.3)
Chloride: 107 mmol/L (ref 98–111)
Creatinine: 0.65 mg/dL (ref 0.44–1.00)
GFR, Est AFR Am: >60 mL/min (ref >60)
GFR, Estimated: >60 mL/min (ref >60)
Glucose, Bld: 121 mg/dL — ABNORMAL HIGH (ref 70–99)
Potassium: 3.9 mmol/L (ref 3.5–5.1)
Sodium: 140 mmol/L (ref 135–145)
Total Bilirubin: 0.4 mg/dL (ref 0.3–1.2)
Total Protein: 6.5 g/dL (ref 6.5–8.1)

## 2020-05-09 LAB — CBC WITH DIFFERENTIAL (CANCER CENTER ONLY)
Abs Immature Granulocytes: 0.02 10*3/uL (ref 0.00–0.07)
Basophils Absolute: 0 10*3/uL (ref 0.0–0.1)
Basophils Relative: 1 %
Eosinophils Absolute: 0.1 10*3/uL (ref 0.0–0.5)
Eosinophils Relative: 4 %
HCT: 28.7 % — ABNORMAL LOW (ref 36.0–46.0)
Hemoglobin: 9.3 g/dL — ABNORMAL LOW (ref 12.0–15.0)
Immature Granulocytes: 1 %
Lymphocytes Relative: 27 %
Lymphs Abs: 0.7 10*3/uL (ref 0.7–4.0)
MCH: 30.6 pg (ref 26.0–34.0)
MCHC: 32.4 g/dL (ref 30.0–36.0)
MCV: 94.4 fL (ref 80.0–100.0)
Monocytes Absolute: 0.4 10*3/uL (ref 0.1–1.0)
Monocytes Relative: 15 %
Neutro Abs: 1.4 10*3/uL — ABNORMAL LOW (ref 1.7–7.7)
Neutrophils Relative %: 52 %
Platelet Count: 95 10*3/uL — ABNORMAL LOW (ref 150–400)
RBC: 3.04 MIL/uL — ABNORMAL LOW (ref 3.87–5.11)
RDW: 17 % — ABNORMAL HIGH (ref 11.5–15.5)
WBC Count: 2.7 10*3/uL — ABNORMAL LOW (ref 4.0–10.5)
nRBC: 0 % (ref 0.0–0.2)

## 2020-05-09 MED ORDER — HEPARIN SOD (PORK) LOCK FLUSH 100 UNIT/ML IV SOLN
500.0000 [IU] | Freq: Once | INTRAVENOUS | Status: AC | PRN
  Administered 2020-05-09: 500 [IU]
  Filled 2020-05-09: qty 5

## 2020-05-09 MED ORDER — SODIUM CHLORIDE 0.9 % IV SOLN
60.0000 mg/m2 | Freq: Once | INTRAVENOUS | Status: AC
  Administered 2020-05-09: 96 mg via INTRAVENOUS
  Filled 2020-05-09: qty 16

## 2020-05-09 MED ORDER — SODIUM CHLORIDE 0.9 % IV SOLN
10.0000 mg | Freq: Once | INTRAVENOUS | Status: AC
  Administered 2020-05-09: 10 mg via INTRAVENOUS
  Filled 2020-05-09: qty 10

## 2020-05-09 MED ORDER — FAMOTIDINE IN NACL 20-0.9 MG/50ML-% IV SOLN
INTRAVENOUS | Status: AC
  Filled 2020-05-09: qty 50

## 2020-05-09 MED ORDER — SODIUM CHLORIDE 0.9% FLUSH
10.0000 mL | INTRAVENOUS | Status: DC | PRN
  Administered 2020-05-09: 10 mL
  Filled 2020-05-09: qty 10

## 2020-05-09 MED ORDER — SODIUM CHLORIDE 0.9 % IV SOLN
Freq: Once | INTRAVENOUS | Status: AC
  Filled 2020-05-09: qty 250

## 2020-05-09 MED ORDER — FAMOTIDINE IN NACL 20-0.9 MG/50ML-% IV SOLN
20.0000 mg | Freq: Once | INTRAVENOUS | Status: AC
  Administered 2020-05-09: 20 mg via INTRAVENOUS

## 2020-05-09 NOTE — Progress Notes (Signed)
Per MD okay to treat with ANC 1.4 and Plt 95

## 2020-05-09 NOTE — Assessment & Plan Note (Signed)
02/02/2020:Right breast lump tenderness. Diagnostic mammogram and [place] on 02/01/20 showed a 2.0cm mass at the 10 o'clock position with no axillary adenopathy. 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 3 indeterminate 4 and 5 mm enhancing masses (recommend MRI biopsy), single prominent right axillary lymph node (second look ultrasound and biopsy recommended) ------------------------------------------------------------------------------------------------------------------------------------------------------- Current treatment: Completed 4 cycles ofdose dense Adriamycin and Cytoxan, today cycle 3 Taxol with carboplatin (q 3 weeks) Echocardiogram 02/20/2020: EF 60 to 65% Chemo toxicities: 1.Acid reflux/nausea:Marked improvement with omeprazole. 2.Severe fatigue 3.Lack of taste:Metallic taste much improved 4.Chemotherapy-induced anemia: Hemoglobin is 10.4  Continue weekly Taxol and every 3-week carboplatin treatments.

## 2020-05-09 NOTE — Patient Instructions (Signed)

## 2020-05-09 NOTE — Telephone Encounter (Signed)
No 6/24 los, no changes made to patient schedule

## 2020-05-09 NOTE — Patient Instructions (Signed)
[facility] Discharge Instructions for Patients Receiving Chemotherapy  Today you received the following chemotherapy agents:  Taxol.  To help prevent nausea and vomiting after your treatment, we encourage you to take your nausea medication as directed.   If you develop nausea and vomiting that is not controlled by your nausea medication, call the clinic.   BELOW ARE SYMPTOMS THAT SHOULD BE REPORTED IMMEDIATELY:  *FEVER GREATER THAN 100.5 F  *CHILLS WITH OR WITHOUT FEVER  NAUSEA AND VOMITING THAT IS NOT CONTROLLED WITH YOUR NAUSEA MEDICATION  *UNUSUAL SHORTNESS OF BREATH  *UNUSUAL BRUISING OR BLEEDING  TENDERNESS IN MOUTH AND THROAT WITH OR WITHOUT PRESENCE OF ULCERS  *URINARY PROBLEMS  *BOWEL PROBLEMS  UNUSUAL RASH Items with * indicate a potential emergency and should be followed up as soon as possible.  Feel free to call the clinic should you have any questions or concerns. The clinic phone number is [phone].  Please show the CHEMO ALERT CARD at check-in to the Emergency Department and triage nurse.   

## 2020-05-17 ENCOUNTER — Inpatient Hospital Stay: Payer: 59

## 2020-05-17 ENCOUNTER — Inpatient Hospital Stay: Payer: 59 | Admitting: Licensed Clinical Social Worker

## 2020-05-17 ENCOUNTER — Inpatient Hospital Stay: Payer: 59 | Attending: Hematology and Oncology

## 2020-05-17 ENCOUNTER — Other Ambulatory Visit: Payer: Self-pay

## 2020-05-17 VITALS — BP 103/60 | HR 86 | Temp 99.4°F | Resp 18 | Wt 139.0 lb

## 2020-05-17 DIAGNOSIS — Z171 Estrogen receptor negative status [ER-]: Secondary | ICD-10-CM | POA: Insufficient documentation

## 2020-05-17 DIAGNOSIS — Z823 Family history of stroke: Secondary | ICD-10-CM | POA: Diagnosis not present

## 2020-05-17 DIAGNOSIS — Z8249 Family history of ischemic heart disease and other diseases of the circulatory system: Secondary | ICD-10-CM | POA: Insufficient documentation

## 2020-05-17 DIAGNOSIS — D696 Thrombocytopenia, unspecified: Secondary | ICD-10-CM | POA: Insufficient documentation

## 2020-05-17 DIAGNOSIS — Z801 Family history of malignant neoplasm of trachea, bronchus and lung: Secondary | ICD-10-CM | POA: Diagnosis not present

## 2020-05-17 DIAGNOSIS — Z818 Family history of other mental and behavioral disorders: Secondary | ICD-10-CM | POA: Diagnosis not present

## 2020-05-17 DIAGNOSIS — Z833 Family history of diabetes mellitus: Secondary | ICD-10-CM | POA: Diagnosis not present

## 2020-05-17 DIAGNOSIS — Z800 Family history of malignant neoplasm of digestive organs: Secondary | ICD-10-CM | POA: Diagnosis not present

## 2020-05-17 DIAGNOSIS — T451X5A Adverse effect of antineoplastic and immunosuppressive drugs, initial encounter: Secondary | ICD-10-CM | POA: Diagnosis not present

## 2020-05-17 DIAGNOSIS — Z95828 Presence of other vascular implants and grafts: Secondary | ICD-10-CM

## 2020-05-17 DIAGNOSIS — Z803 Family history of malignant neoplasm of breast: Secondary | ICD-10-CM | POA: Insufficient documentation

## 2020-05-17 DIAGNOSIS — Z8349 Family history of other endocrine, nutritional and metabolic diseases: Secondary | ICD-10-CM | POA: Diagnosis not present

## 2020-05-17 DIAGNOSIS — Z79899 Other long term (current) drug therapy: Secondary | ICD-10-CM | POA: Diagnosis not present

## 2020-05-17 DIAGNOSIS — C50411 Malignant neoplasm of upper-outer quadrant of right female breast: Secondary | ICD-10-CM | POA: Diagnosis not present

## 2020-05-17 DIAGNOSIS — R5383 Other fatigue: Secondary | ICD-10-CM | POA: Diagnosis not present

## 2020-05-17 DIAGNOSIS — D6481 Anemia due to antineoplastic chemotherapy: Secondary | ICD-10-CM | POA: Diagnosis not present

## 2020-05-17 DIAGNOSIS — Z5111 Encounter for antineoplastic chemotherapy: Secondary | ICD-10-CM | POA: Diagnosis not present

## 2020-05-17 LAB — CBC WITH DIFFERENTIAL (CANCER CENTER ONLY)
Abs Immature Granulocytes: 0.03 10*3/uL (ref 0.00–0.07)
Basophils Absolute: 0 10*3/uL (ref 0.0–0.1)
Basophils Relative: 1 %
Eosinophils Absolute: 0.1 10*3/uL (ref 0.0–0.5)
Eosinophils Relative: 2 %
HCT: 26.1 % — ABNORMAL LOW (ref 36.0–46.0)
Hemoglobin: 8.6 g/dL — ABNORMAL LOW (ref 12.0–15.0)
Immature Granulocytes: 1 %
Lymphocytes Relative: 19 %
Lymphs Abs: 0.7 10*3/uL (ref 0.7–4.0)
MCH: 32.2 pg (ref 26.0–34.0)
MCHC: 33 g/dL (ref 30.0–36.0)
MCV: 97.8 fL (ref 80.0–100.0)
Monocytes Absolute: 0.4 10*3/uL (ref 0.1–1.0)
Monocytes Relative: 11 %
Neutro Abs: 2.3 10*3/uL (ref 1.7–7.7)
Neutrophils Relative %: 66 %
Platelet Count: 110 10*3/uL — ABNORMAL LOW (ref 150–400)
RBC: 2.67 MIL/uL — ABNORMAL LOW (ref 3.87–5.11)
RDW: 17 % — ABNORMAL HIGH (ref 11.5–15.5)
WBC Count: 3.4 10*3/uL — ABNORMAL LOW (ref 4.0–10.5)
nRBC: 0 % (ref 0.0–0.2)

## 2020-05-17 LAB — CMP (CANCER CENTER ONLY)
ALT: 57 U/L — ABNORMAL HIGH (ref 0–44)
AST: 30 U/L (ref 15–41)
Albumin: 3.6 g/dL (ref 3.5–5.0)
Alkaline Phosphatase: 70 U/L (ref 38–126)
Anion gap: 8 (ref 5–15)
BUN: 12 mg/dL (ref 6–20)
CO2: 25 mmol/L (ref 22–32)
Calcium: 8.7 mg/dL — ABNORMAL LOW (ref 8.9–10.3)
Chloride: 109 mmol/L (ref 98–111)
Creatinine: 0.64 mg/dL (ref 0.44–1.00)
GFR, Est AFR Am: >60 mL/min (ref >60)
GFR, Estimated: >60 mL/min (ref >60)
Glucose, Bld: 110 mg/dL — ABNORMAL HIGH (ref 70–99)
Potassium: 3.8 mmol/L (ref 3.5–5.1)
Sodium: 142 mmol/L (ref 135–145)
Total Bilirubin: 0.3 mg/dL (ref 0.3–1.2)
Total Protein: 6.1 g/dL — ABNORMAL LOW (ref 6.5–8.1)

## 2020-05-17 MED ORDER — FAMOTIDINE IN NACL 20-0.9 MG/50ML-% IV SOLN
20.0000 mg | Freq: Once | INTRAVENOUS | Status: AC
  Administered 2020-05-17: 20 mg via INTRAVENOUS

## 2020-05-17 MED ORDER — SODIUM CHLORIDE 0.9% FLUSH
10.0000 mL | INTRAVENOUS | Status: DC | PRN
  Administered 2020-05-17: 10 mL
  Filled 2020-05-17: qty 10

## 2020-05-17 MED ORDER — PALONOSETRON HCL INJECTION 0.25 MG/5ML
0.2500 mg | Freq: Once | INTRAVENOUS | Status: AC
  Administered 2020-05-17: 0.25 mg via INTRAVENOUS

## 2020-05-17 MED ORDER — SODIUM CHLORIDE 0.9 % IV SOLN
Freq: Once | INTRAVENOUS | Status: AC
  Filled 2020-05-17: qty 250

## 2020-05-17 MED ORDER — PALONOSETRON HCL INJECTION 0.25 MG/5ML
INTRAVENOUS | Status: AC
  Filled 2020-05-17: qty 5

## 2020-05-17 MED ORDER — SODIUM CHLORIDE 0.9 % IV SOLN
150.0000 mg | Freq: Once | INTRAVENOUS | Status: AC
  Administered 2020-05-17: 150 mg via INTRAVENOUS
  Filled 2020-05-17: qty 150

## 2020-05-17 MED ORDER — SODIUM CHLORIDE 0.9 % IV SOLN
10.0000 mg | Freq: Once | INTRAVENOUS | Status: AC
  Administered 2020-05-17: 10 mg via INTRAVENOUS
  Filled 2020-05-17: qty 10

## 2020-05-17 MED ORDER — HEPARIN SOD (PORK) LOCK FLUSH 100 UNIT/ML IV SOLN
500.0000 [IU] | Freq: Once | INTRAVENOUS | Status: AC | PRN
  Administered 2020-05-17: 500 [IU]
  Filled 2020-05-17: qty 5

## 2020-05-17 MED ORDER — FAMOTIDINE IN NACL 20-0.9 MG/50ML-% IV SOLN
INTRAVENOUS | Status: AC
  Filled 2020-05-17: qty 50

## 2020-05-17 MED ORDER — SODIUM CHLORIDE 0.9 % IV SOLN
531.0000 mg | Freq: Once | INTRAVENOUS | Status: AC
  Administered 2020-05-17: 530 mg via INTRAVENOUS
  Filled 2020-05-17: qty 53

## 2020-05-17 MED ORDER — SODIUM CHLORIDE 0.9 % IV SOLN
60.0000 mg/m2 | Freq: Once | INTRAVENOUS | Status: AC
  Administered 2020-05-17: 96 mg via INTRAVENOUS
  Filled 2020-05-17: qty 16

## 2020-05-17 NOTE — Patient Instructions (Addendum)
[facility] Discharge Instructions for Patients Receiving Chemotherapy  Today you received the following chemotherapy agent: Paclitaxel (Taxol) and Carboplatin  To help prevent nausea and vomiting after your treatment, we encourage you to take your nausea medication as directed by your MD.   If you develop nausea and vomiting that is not controlled by your nausea medication, call the clinic.   BELOW ARE SYMPTOMS THAT SHOULD BE REPORTED IMMEDIATELY:  *FEVER GREATER THAN 100.5 F  *CHILLS WITH OR WITHOUT FEVER  NAUSEA AND VOMITING THAT IS NOT CONTROLLED WITH YOUR NAUSEA MEDICATION  *UNUSUAL SHORTNESS OF BREATH  *UNUSUAL BRUISING OR BLEEDING  TENDERNESS IN MOUTH AND THROAT WITH OR WITHOUT PRESENCE OF ULCERS  *URINARY PROBLEMS  *BOWEL PROBLEMS  UNUSUAL RASH Items with * indicate a potential emergency and should be followed up as soon as possible.  Feel free to call the clinic should you have any questions or concerns. The clinic phone number is [phone].  Please show the [place] at check-in to the Emergency Department and triage nurse.

## 2020-05-17 NOTE — Progress Notes (Signed)
Pt. declines cryotherapy with treatment today.

## 2020-05-17 NOTE — Progress Notes (Signed)
[place] CSW Progress Note  Holiday representative met with patient in infusion for ongoing emotional support.  Virginia [name] reports that she is doing very well today. She is excited that her taste issues have resolved and energy is coming back. She states "I feel more like my old self again". Anxiety is decreased although was frustrated last week with delays when in the office. Overall, she has been able to go in to work and is slowly increasing her steps each day. No concerns at this time.  CSW will continue to check in every 2 weeks during chemotherapy.    Virginia [name] E Virginia Zhao LCSW, LCSW

## 2020-05-23 NOTE — Progress Notes (Signed)
Patient Care Team: Virginia Crews, MD as PCP - General (Family Medicine) Mauro Kaufmann, RN as Oncology Nurse Navigator Rockwell Germany, RN as Oncology Nurse Navigator  DIAGNOSIS:    ICD-10-CM   1. Malignant neoplasm of upper-outer quadrant of right breast in female, estrogen receptor negative ([place])  C50.411    Z17.1     SUMMARY OF ONCOLOGIC HISTORY: Oncology History  Malignant neoplasm of upper-outer quadrant of right breast in female, estrogen receptor negative ([name])  02/02/2020 Initial Diagnosis   Right breast lump tenderness. Diagnostic mammogram and [place] on 02/01/20 showed a 2.0cm mass at the 10 o'clock position with no axillary adenopathy. 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, ER/PR negative, Ki67 80%   02/08/2020 Cancer Staging   Staging form: Breast, AJCC 8th Edition - Clinical stage from 02/08/2020: Stage IB (cT1c, cN0, cM0, G3, ER-, PR-, HER2-) - Signed by Nicholas Lose, MD on 02/08/2020   02/23/2020 -  Chemotherapy   The patient had dexamethasone (DECADRON) 4 MG tablet, 1 of 1 cycle, Start date: 02/08/2020, End date: 04/22/2020 DOXOrubicin (ADRIAMYCIN) chemo injection 98 mg, 60 mg/m2 = 98 mg, Intravenous,  Once, 4 of 4 cycles Administration: 98 mg (02/23/2020), 98 mg (03/08/2020), 98 mg (03/22/2020), 98 mg (04/05/2020) palonosetron (ALOXI) injection 0.25 mg, 0.25 mg, Intravenous,  Once, 6 of 8 cycles Administration: 0.25 mg (02/23/2020), 0.25 mg (04/22/2020), 0.25 mg (03/08/2020), 0.25 mg (03/22/2020), 0.25 mg (04/05/2020), 0.25 mg (05/17/2020) pegfilgrastim (NEULASTA ONPRO KIT) injection 6 mg, 6 mg, Subcutaneous, Once, 4 of 4 cycles Administration: 6 mg (02/23/2020), 6 mg (03/08/2020), 6 mg (03/22/2020), 6 mg (04/05/2020) CARBOplatin (PARAPLATIN) 630 mg in sodium chloride 0.9 % 250 mL chemo infusion, 700 mg (100 % of original dose 700 mg), Intravenous,  Once, 2 of 4 cycles Dose modification: 700 mg (original dose 700 mg, Cycle 5), 637.2 mg (original dose 700 mg,  Cycle 6, Reason: Change in SCr/CrCl),   (original dose 700 mg, Cycle 6, Reason: Dose not tolerated) Administration: 630 mg (04/22/2020), 530 mg (05/17/2020) cyclophosphamide (CYTOXAN) 980 mg in sodium chloride 0.9 % 250 mL chemo infusion, 600 mg/m2 = 980 mg, Intravenous,  Once, 4 of 4 cycles Administration: 980 mg (02/23/2020), 980 mg (03/08/2020), 980 mg (03/22/2020), 980 mg (04/05/2020) PACLitaxel (TAXOL) 132 mg in sodium chloride 0.9 % 250 mL chemo infusion (</= 32m/m2), 80 mg/m2 = 132 mg, Intravenous,  Once, 2 of 4 cycles Dose modification: 60 mg/m2 (original dose 80 mg/m2, Cycle 5, Reason: Dose not tolerated) Administration: 132 mg (04/22/2020), 132 mg (05/03/2020), 96 mg (05/09/2020), 96 mg (05/17/2020) fosaprepitant (EMEND) 150 mg in sodium chloride 0.9 % 145 mL IVPB, 150 mg, Intravenous,  Once, 6 of 8 cycles Administration: 150 mg (02/23/2020), 150 mg (04/22/2020), 150 mg (03/08/2020), 150 mg (03/22/2020), 150 mg (04/05/2020), 150 mg (05/17/2020)  for chemotherapy treatment.     Genetic Testing   Negative genetic testing. No pathogenic variants identified on the [facility] Common Hereditary Cancers Panel. The report date is 02/25/2020.   The Common Hereditary Cancers Panel offered by [facility] includes sequencing and/or deletion duplication testing of the following 48 genes: APC, ATM, AXIN2, BARD1, BMPR1A, BRCA1, BRCA2, BRIP1, CDH1, CDKN2A (p14ARF), CDKN2A (p16INK4a), CKD4, CHEK2, CTNNA1, DICER1, EPCAM (Deletion/duplication testing only), GREM1 (promoter region deletion/duplication testing only), KIT, MEN1, MLH1, MSH2, MSH3, MSH6, MUTYH, NBN, NF1, NHTL1, PALB2, PDGFRA, PMS2, POLD1, POLE, PTEN, RAD50, RAD51C, RAD51D, RNF43, SDHB, SDHC, SDHD, SMAD4, SMARCA4. STK11, TP53, TSC1, TSC2, and VHL.  The following genes were evaluated for  sequence changes only: SDHA and HOXB13 c.251G>A variant only.     CHIEF COMPLIANT: Cycle5 Taxol  INTERVAL HISTORY: Virginia [name] is a 54 y.o. with above-mentioned history of breast cancer  currently on neoadjuvant chemotherapywithweekly Taxol and Carboplatin after completing 4 cycles ofdose dense Adriamycin and Cytoxan. She presents to the clinic todayforcycle5.  Her major complaint today is feeling fatigued for the past 4 to 5 days. Today she feels much better. Denies any neuropathy. Her nails are getting tender and bruised.  ALLERGIES:  has No Known Allergies.  MEDICATIONS:  Current Outpatient Medications  Medication Sig Dispense Refill  . escitalopram (LEXAPRO) 20 MG tablet TAKE 1 TABLET (20 MG TOTAL) BY MOUTH DAILY. 90 tablet 1  . lidocaine-prilocaine (EMLA) cream Apply to affected area once 30 g 3   No current facility-administered medications for this visit.    PHYSICAL EXAMINATION: ECOG PERFORMANCE STATUS: 1 - Symptomatic but completely ambulatory  Vitals:   05/24/20 1019  BP: 114/76  Pulse: 93  Resp: 18  Temp: 99.1 F (37.3 C)  SpO2: 100%   Filed Weights   05/24/20 1019  Weight: 136 lb 8 oz (61.9 kg)    LABORATORY DATA:  I have reviewed the data as listed CMP Latest Ref Rng & Units 05/17/2020 05/09/2020 05/03/2020  Glucose 70 - 99 mg/dL 110(H) 121(H) 69(L)  BUN 6 - 20 mg/dL '12 10 11  ' Creatinine 0.44 - 1.00 mg/dL 0.64 0.65 0.59  Sodium 135 - 145 mmol/L 142 140 140  Potassium 3.5 - 5.1 mmol/L 3.8 3.9 3.8  Chloride 98 - 111 mmol/L 109 107 108  CO2 22 - 32 mmol/L '25 24 25  ' Calcium 8.9 - 10.3 mg/dL 8.7(L) 9.1 8.8(L)  Total Protein 6.5 - 8.1 g/dL 6.1(L) 6.5 6.2(L)  Total Bilirubin 0.3 - 1.2 mg/dL 0.3 0.4 0.3  Alkaline Phos 38 - 126 U/L 70 68 62  AST 15 - 41 U/L 30 32 33  ALT 0 - 44 U/L 57(H) 56(H) 49(H)    Lab Results  Component Value Date   WBC 3.4 (L) 05/24/2020   HGB 9.8 (L) 05/24/2020   HCT 29.4 (L) 05/24/2020   MCV 94.8 05/24/2020   PLT 189 05/24/2020   NEUTROABS 2.2 05/24/2020    ASSESSMENT & PLAN:  Malignant neoplasm of upper-outer quadrant of right breast in female, estrogen receptor negative ([place]) 02/02/2020:Right breast lump  tenderness. Diagnostic mammogram and [place] on 02/01/20 showed a 2.0cm mass at the 10 o'clock position with no axillary adenopathy. 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 3 indeterminate 4 and 5 mm enhancing masses (recommend MRI biopsy), single prominent right axillary lymph node (second look ultrasound and biopsy recommended) ------------------------------------------------------------------------------------------------------------------------------------------------------- Current treatment:Completed 4 cycles ofdose dense Adriamycin and Cytoxan, today cycle 5 Taxol with carboplatin (q 3 weeks) Echocardiogram 02/20/2020: EF 60 to 65% Chemo toxicities: 1. Chemotherapy-induced anemia: Today's hemoglobin is 9.8 2.  Thrombocytopenia: Platelet count 189 3. Fatigue due to chemotherapy   We reduced the dosage of dexamethasone to 10 mg IV.  Anxiety issues related to coming in for infusions  Continue weekly Taxol and every 3-week carboplatin  treatments.    No orders of the defined types were placed in this encounter.  The patient has a good understanding of the overall plan. she agrees with it. she will call with any problems that may develop before the next visit here.  Total time spent: 30 mins including face to face time and time spent for planning, charting and coordination of care  Nicholas Lose, MD 05/24/2020  I, Cloyde Reams Dorshimer, am acting as scribe for Dr. Nicholas Lose.  I have reviewed the above documentation  for accuracy and completeness, and I agree with the above.

## 2020-05-24 ENCOUNTER — Inpatient Hospital Stay: Payer: 59

## 2020-05-24 ENCOUNTER — Other Ambulatory Visit: Payer: Self-pay

## 2020-05-24 ENCOUNTER — Inpatient Hospital Stay (HOSPITAL_BASED_OUTPATIENT_CLINIC_OR_DEPARTMENT_OTHER): Payer: 59 | Admitting: Hematology and Oncology

## 2020-05-24 ENCOUNTER — Telehealth: Payer: Self-pay | Admitting: *Deleted

## 2020-05-24 ENCOUNTER — Encounter: Payer: Self-pay | Admitting: *Deleted

## 2020-05-24 DIAGNOSIS — D6481 Anemia due to antineoplastic chemotherapy: Secondary | ICD-10-CM | POA: Diagnosis not present

## 2020-05-24 DIAGNOSIS — C50411 Malignant neoplasm of upper-outer quadrant of right female breast: Secondary | ICD-10-CM

## 2020-05-24 DIAGNOSIS — Z5111 Encounter for antineoplastic chemotherapy: Secondary | ICD-10-CM | POA: Diagnosis not present

## 2020-05-24 DIAGNOSIS — T451X5A Adverse effect of antineoplastic and immunosuppressive drugs, initial encounter: Secondary | ICD-10-CM | POA: Diagnosis not present

## 2020-05-24 DIAGNOSIS — R5383 Other fatigue: Secondary | ICD-10-CM | POA: Diagnosis not present

## 2020-05-24 DIAGNOSIS — Z171 Estrogen receptor negative status [ER-]: Secondary | ICD-10-CM | POA: Diagnosis not present

## 2020-05-24 DIAGNOSIS — Z79899 Other long term (current) drug therapy: Secondary | ICD-10-CM | POA: Diagnosis not present

## 2020-05-24 DIAGNOSIS — D696 Thrombocytopenia, unspecified: Secondary | ICD-10-CM | POA: Diagnosis not present

## 2020-05-24 DIAGNOSIS — Z95828 Presence of other vascular implants and grafts: Secondary | ICD-10-CM

## 2020-05-24 DIAGNOSIS — Z801 Family history of malignant neoplasm of trachea, bronchus and lung: Secondary | ICD-10-CM | POA: Diagnosis not present

## 2020-05-24 LAB — CMP (CANCER CENTER ONLY)
ALT: 36 U/L (ref 0–44)
AST: 24 U/L (ref 15–41)
Albumin: 3.9 g/dL (ref 3.5–5.0)
Alkaline Phosphatase: 73 U/L (ref 38–126)
Anion gap: 9 (ref 5–15)
BUN: 17 mg/dL (ref 6–20)
CO2: 27 mmol/L (ref 22–32)
Calcium: 9.4 mg/dL (ref 8.9–10.3)
Chloride: 105 mmol/L (ref 98–111)
Creatinine: 0.72 mg/dL (ref 0.44–1.00)
GFR, Est AFR Am: >60 mL/min (ref >60)
GFR, Estimated: >60 mL/min (ref >60)
Glucose, Bld: 107 mg/dL — ABNORMAL HIGH (ref 70–99)
Potassium: 3.7 mmol/L (ref 3.5–5.1)
Sodium: 141 mmol/L (ref 135–145)
Total Bilirubin: 0.4 mg/dL (ref 0.3–1.2)
Total Protein: 6.7 g/dL (ref 6.5–8.1)

## 2020-05-24 LAB — CBC WITH DIFFERENTIAL (CANCER CENTER ONLY)
Abs Immature Granulocytes: 0.02 10*3/uL (ref 0.00–0.07)
Basophils Absolute: 0 10*3/uL (ref 0.0–0.1)
Basophils Relative: 1 %
Eosinophils Absolute: 0.1 10*3/uL (ref 0.0–0.5)
Eosinophils Relative: 2 %
HCT: 29.4 % — ABNORMAL LOW (ref 36.0–46.0)
Hemoglobin: 9.8 g/dL — ABNORMAL LOW (ref 12.0–15.0)
Immature Granulocytes: 1 %
Lymphocytes Relative: 22 %
Lymphs Abs: 0.7 10*3/uL (ref 0.7–4.0)
MCH: 31.6 pg (ref 26.0–34.0)
MCHC: 33.3 g/dL (ref 30.0–36.0)
MCV: 94.8 fL (ref 80.0–100.0)
Monocytes Absolute: 0.4 10*3/uL (ref 0.1–1.0)
Monocytes Relative: 10 %
Neutro Abs: 2.2 10*3/uL (ref 1.7–7.7)
Neutrophils Relative %: 64 %
Platelet Count: 189 10*3/uL (ref 150–400)
RBC: 3.1 MIL/uL — ABNORMAL LOW (ref 3.87–5.11)
RDW: 15.3 % (ref 11.5–15.5)
WBC Count: 3.4 10*3/uL — ABNORMAL LOW (ref 4.0–10.5)
nRBC: 0 % (ref 0.0–0.2)

## 2020-05-24 MED ORDER — FAMOTIDINE IN NACL 20-0.9 MG/50ML-% IV SOLN
20.0000 mg | Freq: Once | INTRAVENOUS | Status: AC
  Administered 2020-05-24: 20 mg via INTRAVENOUS

## 2020-05-24 MED ORDER — FAMOTIDINE IN NACL 20-0.9 MG/50ML-% IV SOLN
INTRAVENOUS | Status: AC
  Filled 2020-05-24: qty 50

## 2020-05-24 MED ORDER — SODIUM CHLORIDE 0.9 % IV SOLN
Freq: Once | INTRAVENOUS | Status: AC
  Filled 2020-05-24: qty 250

## 2020-05-24 MED ORDER — SODIUM CHLORIDE 0.9 % IV SOLN
60.0000 mg/m2 | Freq: Once | INTRAVENOUS | Status: AC
  Administered 2020-05-24: 96 mg via INTRAVENOUS
  Filled 2020-05-24: qty 16

## 2020-05-24 MED ORDER — SODIUM CHLORIDE 0.9% FLUSH
10.0000 mL | INTRAVENOUS | Status: DC | PRN
  Administered 2020-05-24: 10 mL
  Filled 2020-05-24: qty 10

## 2020-05-24 MED ORDER — SODIUM CHLORIDE 0.9 % IV SOLN
10.0000 mg | Freq: Once | INTRAVENOUS | Status: AC
  Administered 2020-05-24: 10 mg via INTRAVENOUS
  Filled 2020-05-24: qty 10

## 2020-05-24 MED ORDER — HEPARIN SOD (PORK) LOCK FLUSH 100 UNIT/ML IV SOLN
500.0000 [IU] | Freq: Once | INTRAVENOUS | Status: AC | PRN
  Administered 2020-05-24: 500 [IU]
  Filled 2020-05-24: qty 5

## 2020-05-24 NOTE — Patient Instructions (Signed)
[facility] Discharge Instructions for Patients Receiving Chemotherapy  Today you received the following chemotherapy agents:  Taxol.  To help prevent nausea and vomiting after your treatment, we encourage you to take your nausea medication as directed.   If you develop nausea and vomiting that is not controlled by your nausea medication, call the clinic.   BELOW ARE SYMPTOMS THAT SHOULD BE REPORTED IMMEDIATELY:  *FEVER GREATER THAN 100.5 F  *CHILLS WITH OR WITHOUT FEVER  NAUSEA AND VOMITING THAT IS NOT CONTROLLED WITH YOUR NAUSEA MEDICATION  *UNUSUAL SHORTNESS OF BREATH  *UNUSUAL BRUISING OR BLEEDING  TENDERNESS IN MOUTH AND THROAT WITH OR WITHOUT PRESENCE OF ULCERS  *URINARY PROBLEMS  *BOWEL PROBLEMS  UNUSUAL RASH Items with * indicate a potential emergency and should be followed up as soon as possible.  Feel free to call the clinic should you have any questions or concerns. The clinic phone number is [phone].  Please show the CHEMO ALERT CARD at check-in to the Emergency Department and triage nurse.   

## 2020-05-24 NOTE — Assessment & Plan Note (Signed)
02/02/2020:Right breast lump tenderness. Diagnostic mammogram and [place] on 02/01/20 showed a 2.0cm mass at the 10 o'clock position with no axillary adenopathy. 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 3 indeterminate 4 and 5 mm enhancing masses (recommend MRI biopsy), single prominent right axillary lymph node (second look ultrasound and biopsy recommended) ------------------------------------------------------------------------------------------------------------------------------------------------------- Current treatment:Completed 4 cycles ofdose dense Adriamycin and Cytoxan, today cycle 5 Taxol with carboplatin (q 3 weeks) Echocardiogram 02/20/2020: EF 60 to 65% Chemo toxicities: 1. Chemotherapy-induced anemia: Today's hemoglobin is 9.3 2.  Thrombocytopenia: Platelet count 95 okay to treat, reduced the dosage of Taxol. 3.  We reduced the dosage of dexamethasone to 10 mg IV.  Anxiety issues related to coming in for infusions  Continue weekly Taxol and every 3-week carboplatin treatments.

## 2020-05-24 NOTE — Telephone Encounter (Signed)
Called pt to assess needs prior to chemo. Relate doing well. Denies questions or needs at this time.

## 2020-05-28 ENCOUNTER — Telehealth: Payer: Self-pay | Admitting: Hematology and Oncology

## 2020-05-28 NOTE — Telephone Encounter (Signed)
Scheduled per 7/9 los. Added appts to appts pt is already aware of, mailing appt letter and calendar printout

## 2020-05-31 ENCOUNTER — Inpatient Hospital Stay: Payer: 59

## 2020-05-31 ENCOUNTER — Other Ambulatory Visit: Payer: Self-pay

## 2020-05-31 ENCOUNTER — Encounter: Payer: Self-pay | Admitting: *Deleted

## 2020-05-31 ENCOUNTER — Inpatient Hospital Stay: Payer: 59 | Admitting: Licensed Clinical Social Worker

## 2020-05-31 VITALS — BP 91/75 | HR 96 | Temp 99.1°F | Resp 18

## 2020-05-31 DIAGNOSIS — Z801 Family history of malignant neoplasm of trachea, bronchus and lung: Secondary | ICD-10-CM | POA: Diagnosis not present

## 2020-05-31 DIAGNOSIS — Z171 Estrogen receptor negative status [ER-]: Secondary | ICD-10-CM | POA: Diagnosis not present

## 2020-05-31 DIAGNOSIS — Z5111 Encounter for antineoplastic chemotherapy: Secondary | ICD-10-CM | POA: Diagnosis not present

## 2020-05-31 DIAGNOSIS — T451X5A Adverse effect of antineoplastic and immunosuppressive drugs, initial encounter: Secondary | ICD-10-CM | POA: Diagnosis not present

## 2020-05-31 DIAGNOSIS — C50411 Malignant neoplasm of upper-outer quadrant of right female breast: Secondary | ICD-10-CM

## 2020-05-31 DIAGNOSIS — Z79899 Other long term (current) drug therapy: Secondary | ICD-10-CM | POA: Diagnosis not present

## 2020-05-31 DIAGNOSIS — D696 Thrombocytopenia, unspecified: Secondary | ICD-10-CM | POA: Diagnosis not present

## 2020-05-31 DIAGNOSIS — R5383 Other fatigue: Secondary | ICD-10-CM | POA: Diagnosis not present

## 2020-05-31 DIAGNOSIS — D6481 Anemia due to antineoplastic chemotherapy: Secondary | ICD-10-CM | POA: Diagnosis not present

## 2020-05-31 DIAGNOSIS — Z95828 Presence of other vascular implants and grafts: Secondary | ICD-10-CM

## 2020-05-31 LAB — CBC WITH DIFFERENTIAL (CANCER CENTER ONLY)
Abs Immature Granulocytes: 0.01 K/uL (ref 0.00–0.07)
Basophils Absolute: 0 K/uL (ref 0.0–0.1)
Basophils Relative: 1 %
Eosinophils Absolute: 0 K/uL (ref 0.0–0.5)
Eosinophils Relative: 1 %
HCT: 26.6 % — ABNORMAL LOW (ref 36.0–46.0)
Hemoglobin: 8.7 g/dL — ABNORMAL LOW (ref 12.0–15.0)
Immature Granulocytes: 0 %
Lymphocytes Relative: 28 %
Lymphs Abs: 0.8 K/uL (ref 0.7–4.0)
MCH: 31.6 pg (ref 26.0–34.0)
MCHC: 32.7 g/dL (ref 30.0–36.0)
MCV: 96.7 fL (ref 80.0–100.0)
Monocytes Absolute: 0.3 K/uL (ref 0.1–1.0)
Monocytes Relative: 11 %
Neutro Abs: 1.7 K/uL (ref 1.7–7.7)
Neutrophils Relative %: 59 %
Platelet Count: 160 K/uL (ref 150–400)
RBC: 2.75 MIL/uL — ABNORMAL LOW (ref 3.87–5.11)
RDW: 15.6 % — ABNORMAL HIGH (ref 11.5–15.5)
WBC Count: 2.9 K/uL — ABNORMAL LOW (ref 4.0–10.5)
nRBC: 0 % (ref 0.0–0.2)

## 2020-05-31 LAB — CMP (CANCER CENTER ONLY)
ALT: 63 U/L — ABNORMAL HIGH (ref 0–44)
AST: 46 U/L — ABNORMAL HIGH (ref 15–41)
Albumin: 3.8 g/dL (ref 3.5–5.0)
Alkaline Phosphatase: 74 U/L (ref 38–126)
Anion gap: 10 (ref 5–15)
BUN: 11 mg/dL (ref 6–20)
CO2: 27 mmol/L (ref 22–32)
Calcium: 9.2 mg/dL (ref 8.9–10.3)
Chloride: 105 mmol/L (ref 98–111)
Creatinine: 0.73 mg/dL (ref 0.44–1.00)
GFR, Est AFR Am: >60 mL/min
GFR, Estimated: >60 mL/min
Glucose, Bld: 128 mg/dL — ABNORMAL HIGH (ref 70–99)
Potassium: 3.7 mmol/L (ref 3.5–5.1)
Sodium: 142 mmol/L (ref 135–145)
Total Bilirubin: 0.4 mg/dL (ref 0.3–1.2)
Total Protein: 6.4 g/dL — ABNORMAL LOW (ref 6.5–8.1)

## 2020-05-31 MED ORDER — SODIUM CHLORIDE 0.9 % IV SOLN
Freq: Once | INTRAVENOUS | Status: AC
  Filled 2020-05-31: qty 250

## 2020-05-31 MED ORDER — SODIUM CHLORIDE 0.9 % IV SOLN
10.0000 mg | Freq: Once | INTRAVENOUS | Status: AC
  Administered 2020-05-31: 10 mg via INTRAVENOUS
  Filled 2020-05-31: qty 10

## 2020-05-31 MED ORDER — FAMOTIDINE IN NACL 20-0.9 MG/50ML-% IV SOLN
20.0000 mg | Freq: Once | INTRAVENOUS | Status: AC
  Administered 2020-05-31: 20 mg via INTRAVENOUS

## 2020-05-31 MED ORDER — HEPARIN SOD (PORK) LOCK FLUSH 100 UNIT/ML IV SOLN
500.0000 [IU] | Freq: Once | INTRAVENOUS | Status: AC | PRN
  Administered 2020-05-31: 500 [IU]
  Filled 2020-05-31: qty 5

## 2020-05-31 MED ORDER — FAMOTIDINE IN NACL 20-0.9 MG/50ML-% IV SOLN
INTRAVENOUS | Status: AC
  Filled 2020-05-31: qty 50

## 2020-05-31 MED ORDER — SODIUM CHLORIDE 0.9 % IV SOLN
60.0000 mg/m2 | Freq: Once | INTRAVENOUS | Status: AC
  Administered 2020-05-31: 96 mg via INTRAVENOUS
  Filled 2020-05-31: qty 16

## 2020-05-31 MED ORDER — SODIUM CHLORIDE 0.9% FLUSH
10.0000 mL | INTRAVENOUS | Status: DC | PRN
  Administered 2020-05-31: 10 mL
  Filled 2020-05-31: qty 10

## 2020-05-31 NOTE — Patient Instructions (Signed)

## 2020-05-31 NOTE — Progress Notes (Signed)
[place] CSW Progress Note  Holiday representative met with patient to provide ongoing adjustment counseling. Patient reports to be doing well. She is a little anxious today as she is thinking about appt with surgeon on Monday and wanting to get her appointment scheduled. She will likely choose bilateral mastectomy. Otherwise, she has been doing well communicating her needs with family and friends and setting boundaries, which most people are respecting. CSW normalized feelings and discussed ways to take back control when possible.   CSW will continue to check-in throughout treatment.  Derion Kreiter E Annamae Shivley LCSW, LCSW

## 2020-05-31 NOTE — Patient Instructions (Signed)
[facility] Discharge Instructions for Patients Receiving Chemotherapy  Today you received the following chemotherapy agents:  Taxol.  To help prevent nausea and vomiting after your treatment, we encourage you to take your nausea medication as directed.   If you develop nausea and vomiting that is not controlled by your nausea medication, call the clinic.   BELOW ARE SYMPTOMS THAT SHOULD BE REPORTED IMMEDIATELY:  *FEVER GREATER THAN 100.5 F  *CHILLS WITH OR WITHOUT FEVER  NAUSEA AND VOMITING THAT IS NOT CONTROLLED WITH YOUR NAUSEA MEDICATION  *UNUSUAL SHORTNESS OF BREATH  *UNUSUAL BRUISING OR BLEEDING  TENDERNESS IN MOUTH AND THROAT WITH OR WITHOUT PRESENCE OF ULCERS  *URINARY PROBLEMS  *BOWEL PROBLEMS  UNUSUAL RASH Items with * indicate a potential emergency and should be followed up as soon as possible.  Feel free to call the clinic should you have any questions or concerns. The clinic phone number is [phone].  Please show the CHEMO ALERT CARD at check-in to the Emergency Department and triage nurse.   

## 2020-06-03 DIAGNOSIS — C50411 Malignant neoplasm of upper-outer quadrant of right female breast: Secondary | ICD-10-CM | POA: Diagnosis not present

## 2020-06-03 DIAGNOSIS — Z171 Estrogen receptor negative status [ER-]: Secondary | ICD-10-CM | POA: Diagnosis not present

## 2020-06-06 NOTE — Progress Notes (Signed)
Patient Care Team: Virginia Crews, MD as PCP - General (Family Medicine) Mauro Kaufmann, RN as Oncology Nurse Navigator Rockwell Germany, RN as Oncology Nurse Navigator  DIAGNOSIS:    ICD-10-CM   1. Malignant neoplasm of upper-outer quadrant of right breast in female, estrogen receptor negative ([place])  C50.411    Z17.1     SUMMARY OF ONCOLOGIC HISTORY: Oncology History  Malignant neoplasm of upper-outer quadrant of right breast in female, estrogen receptor negative ([place])  02/02/2020 Initial Diagnosis   Right breast lump tenderness. Diagnostic mammogram and [place] on 02/01/20 showed a 2.0cm mass at the 10 o'clock position with no axillary adenopathy. 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, ER/PR negative, Ki67 80%   02/08/2020 Cancer Staging   Staging form: Breast, AJCC 8th Edition - Clinical stage from 02/08/2020: Stage IB (cT1c, cN0, cM0, G3, ER-, PR-, HER2-) - Signed by Nicholas Lose, MD on 02/08/2020   02/23/2020 -  Chemotherapy   The patient had dexamethasone (DECADRON) 4 MG tablet, 1 of 1 cycle, Start date: 02/08/2020, End date: 04/22/2020 DOXOrubicin (ADRIAMYCIN) chemo injection 98 mg, 60 mg/m2 = 98 mg, Intravenous,  Once, 4 of 4 cycles Administration: 98 mg (02/23/2020), 98 mg (03/08/2020), 98 mg (03/22/2020), 98 mg (04/05/2020) palonosetron (ALOXI) injection 0.25 mg, 0.25 mg, Intravenous,  Once, 6 of 8 cycles Administration: 0.25 mg (02/23/2020), 0.25 mg (04/22/2020), 0.25 mg (03/08/2020), 0.25 mg (03/22/2020), 0.25 mg (04/05/2020), 0.25 mg (05/17/2020) pegfilgrastim (NEULASTA ONPRO KIT) injection 6 mg, 6 mg, Subcutaneous, Once, 4 of 4 cycles Administration: 6 mg (02/23/2020), 6 mg (03/08/2020), 6 mg (03/22/2020), 6 mg (04/05/2020) CARBOplatin (PARAPLATIN) 630 mg in sodium chloride 0.9 % 250 mL chemo infusion, 700 mg (100 % of original dose 700 mg), Intravenous,  Once, 2 of 4 cycles Dose modification: 700 mg (original dose 700 mg, Cycle 5), 637.2 mg (original dose 700 mg,  Cycle 6, Reason: Change in SCr/CrCl),   (original dose 700 mg, Cycle 6, Reason: Dose not tolerated) Administration: 630 mg (04/22/2020), 530 mg (05/17/2020) cyclophosphamide (CYTOXAN) 980 mg in sodium chloride 0.9 % 250 mL chemo infusion, 600 mg/m2 = 980 mg, Intravenous,  Once, 4 of 4 cycles Administration: 980 mg (02/23/2020), 980 mg (03/08/2020), 980 mg (03/22/2020), 980 mg (04/05/2020) PACLitaxel (TAXOL) 132 mg in sodium chloride 0.9 % 250 mL chemo infusion (</= 10m/m2), 80 mg/m2 = 132 mg, Intravenous,  Once, 2 of 4 cycles Dose modification: 60 mg/m2 (original dose 80 mg/m2, Cycle 5, Reason: Dose not tolerated) Administration: 132 mg (04/22/2020), 132 mg (05/03/2020), 96 mg (05/09/2020), 96 mg (05/17/2020), 96 mg (05/24/2020), 96 mg (05/31/2020) fosaprepitant (EMEND) 150 mg in sodium chloride 0.9 % 145 mL IVPB, 150 mg, Intravenous,  Once, 6 of 8 cycles Administration: 150 mg (02/23/2020), 150 mg (04/22/2020), 150 mg (03/08/2020), 150 mg (03/22/2020), 150 mg (04/05/2020), 150 mg (05/17/2020)  for chemotherapy treatment.     Genetic Testing   Negative genetic testing. No pathogenic variants identified on the [facility] Common Hereditary Cancers Panel. The report date is 02/25/2020.   The Common Hereditary Cancers Panel offered by [facility] includes sequencing and/or deletion duplication testing of the following 48 genes: APC, ATM, AXIN2, BARD1, BMPR1A, BRCA1, BRCA2, BRIP1, CDH1, CDKN2A (p14ARF), CDKN2A (p16INK4a), CKD4, CHEK2, CTNNA1, DICER1, EPCAM (Deletion/duplication testing only), GREM1 (promoter region deletion/duplication testing only), KIT, MEN1, MLH1, MSH2, MSH3, MSH6, MUTYH, NBN, NF1, NHTL1, PALB2, PDGFRA, PMS2, POLD1, POLE, PTEN, RAD50, RAD51C, RAD51D, RNF43, SDHB, SDHC, SDHD, SMAD4, SMARCA4. STK11, TP53, TSC1, TSC2, and VHL.  The following genes were evaluated for sequence changes only: SDHA and HOXB13 c.251G>A variant only.     CHIEF COMPLIANT: Cycle7Taxol Carboplatin  INTERVAL HISTORY: Virginia [name] is a 54  y.o. with above-mentioned history of breast cancer currently on neoadjuvant chemotherapywithweekly Taxol and Carboplatin after completing 4 cycles ofdose dense Adriamycin and Cytoxan. She presents to the clinic todayforcycle7. C/O fatigue and nail tenderness.   ALLERGIES:  has No Known Allergies.  MEDICATIONS:  Current Outpatient Medications  Medication Sig Dispense Refill  . escitalopram (LEXAPRO) 20 MG tablet TAKE 1 TABLET (20 MG TOTAL) BY MOUTH DAILY. 90 tablet 1  . lidocaine-prilocaine (EMLA) cream Apply to affected area once 30 g 3   No current facility-administered medications for this visit.    PHYSICAL EXAMINATION: ECOG PERFORMANCE STATUS: 1 - Symptomatic but completely ambulatory  Vitals:   06/07/20 1108  BP: 109/73  Pulse: 99  Resp: 16  Temp: 98.9 F (37.2 C)  SpO2: 100%   Filed Weights   06/07/20 1108  Weight: 137 lb 4.8 oz (62.3 kg)    LABORATORY DATA:  I have reviewed the data as listed CMP Latest Ref Rng & Units 05/31/2020 05/24/2020 05/17/2020  Glucose 70 - 99 mg/dL 128(H) 107(H) 110(H)  BUN 6 - 20 mg/dL '11 17 12  ' Creatinine 0.44 - 1.00 mg/dL 0.73 0.72 0.64  Sodium 135 - 145 mmol/L 142 141 142  Potassium 3.5 - 5.1 mmol/L 3.7 3.7 3.8  Chloride 98 - 111 mmol/L 105 105 109  CO2 22 - 32 mmol/L '27 27 25  ' Calcium 8.9 - 10.3 mg/dL 9.2 9.4 8.7(L)  Total Protein 6.5 - 8.1 g/dL 6.4(L) 6.7 6.1(L)  Total Bilirubin 0.3 - 1.2 mg/dL 0.4 0.4 0.3  Alkaline Phos 38 - 126 U/L 74 73 70  AST 15 - 41 U/L 46(H) 24 30  ALT 0 - 44 U/L 63(H) 36 57(H)    Lab Results  Component Value Date   WBC 3.0 (L) 06/07/2020   HGB 9.2 (L) 06/07/2020   HCT 27.4 (L) 06/07/2020   MCV 98.2 06/07/2020   PLT 105 (L) 06/07/2020   NEUTROABS 1.8 06/07/2020    ASSESSMENT & PLAN:  Malignant neoplasm of upper-outer quadrant of right breast in female, estrogen receptor negative ([facility]) 02/02/2020:Right breast lump tenderness. Diagnostic mammogram and [place] on 02/01/20 showed a 2.0cm mass at the 10  o'clock position with no axillary adenopathy. 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 3 indeterminate 4 and 5 mm enhancing masses (recommend MRI biopsy), single prominent right axillary lymph node (second look ultrasound and biopsy recommended) ------------------------------------------------------------------------------------------------------------------------------------------------------- Current treatment:Completed 4 cycles ofdose dense Adriamycin and Cytoxan, today cycle7Taxol with carboplatin (q 3 weeks) Echocardiogram 02/20/2020: EF 60 to 65% Chemo toxicities: 1. Chemotherapy-induced anemia: Today's hemoglobin is 9.2 2.Thrombocytopenia: 105 3. Fatigue due to chemotherapy  We reduced the dosage of dexamethasone to 10 mg IV.  Anxiety issues related to coming in for infusions  Continue weekly Taxol and every 3-week carboplatin treatments.    No orders of the defined types were placed in this encounter.  The patient has a good understanding of the  overall plan. she agrees with it. she will call with any problems that may develop before the next visit here.  Total time spent: 30 mins including face to face time and time spent for planning, charting and coordination of care  Nicholas Lose, MD 06/07/2020  I, Cloyde Reams Dorshimer, am acting as scribe for Dr. Nicholas Lose.  I have reviewed the above documentation for accuracy and completeness, and I agree with the above.

## 2020-06-07 ENCOUNTER — Encounter: Payer: Self-pay | Admitting: *Deleted

## 2020-06-07 ENCOUNTER — Other Ambulatory Visit: Payer: Self-pay

## 2020-06-07 ENCOUNTER — Inpatient Hospital Stay: Payer: 59

## 2020-06-07 ENCOUNTER — Inpatient Hospital Stay (HOSPITAL_BASED_OUTPATIENT_CLINIC_OR_DEPARTMENT_OTHER): Payer: 59 | Admitting: Hematology and Oncology

## 2020-06-07 DIAGNOSIS — Z95828 Presence of other vascular implants and grafts: Secondary | ICD-10-CM

## 2020-06-07 DIAGNOSIS — Z801 Family history of malignant neoplasm of trachea, bronchus and lung: Secondary | ICD-10-CM | POA: Diagnosis not present

## 2020-06-07 DIAGNOSIS — D696 Thrombocytopenia, unspecified: Secondary | ICD-10-CM | POA: Diagnosis not present

## 2020-06-07 DIAGNOSIS — D6481 Anemia due to antineoplastic chemotherapy: Secondary | ICD-10-CM | POA: Diagnosis not present

## 2020-06-07 DIAGNOSIS — C50411 Malignant neoplasm of upper-outer quadrant of right female breast: Secondary | ICD-10-CM

## 2020-06-07 DIAGNOSIS — Z171 Estrogen receptor negative status [ER-]: Secondary | ICD-10-CM

## 2020-06-07 DIAGNOSIS — Z5111 Encounter for antineoplastic chemotherapy: Secondary | ICD-10-CM | POA: Diagnosis not present

## 2020-06-07 DIAGNOSIS — Z79899 Other long term (current) drug therapy: Secondary | ICD-10-CM | POA: Diagnosis not present

## 2020-06-07 DIAGNOSIS — R5383 Other fatigue: Secondary | ICD-10-CM | POA: Diagnosis not present

## 2020-06-07 DIAGNOSIS — T451X5A Adverse effect of antineoplastic and immunosuppressive drugs, initial encounter: Secondary | ICD-10-CM | POA: Diagnosis not present

## 2020-06-07 LAB — CBC WITH DIFFERENTIAL (CANCER CENTER ONLY)
Abs Immature Granulocytes: 0.02 10*3/uL (ref 0.00–0.07)
Basophils Absolute: 0 10*3/uL (ref 0.0–0.1)
Basophils Relative: 1 %
Eosinophils Absolute: 0 10*3/uL (ref 0.0–0.5)
Eosinophils Relative: 1 %
HCT: 27.4 % — ABNORMAL LOW (ref 36.0–46.0)
Hemoglobin: 9.2 g/dL — ABNORMAL LOW (ref 12.0–15.0)
Immature Granulocytes: 1 %
Lymphocytes Relative: 25 %
Lymphs Abs: 0.8 10*3/uL (ref 0.7–4.0)
MCH: 33 pg (ref 26.0–34.0)
MCHC: 33.6 g/dL (ref 30.0–36.0)
MCV: 98.2 fL (ref 80.0–100.0)
Monocytes Absolute: 0.3 10*3/uL (ref 0.1–1.0)
Monocytes Relative: 11 %
Neutro Abs: 1.8 10*3/uL (ref 1.7–7.7)
Neutrophils Relative %: 61 %
Platelet Count: 105 10*3/uL — ABNORMAL LOW (ref 150–400)
RBC: 2.79 MIL/uL — ABNORMAL LOW (ref 3.87–5.11)
RDW: 15.1 % (ref 11.5–15.5)
WBC Count: 3 10*3/uL — ABNORMAL LOW (ref 4.0–10.5)
nRBC: 0 % (ref 0.0–0.2)

## 2020-06-07 LAB — CMP (CANCER CENTER ONLY)
ALT: 47 U/L — ABNORMAL HIGH (ref 0–44)
AST: 33 U/L (ref 15–41)
Albumin: 3.8 g/dL (ref 3.5–5.0)
Alkaline Phosphatase: 76 U/L (ref 38–126)
Anion gap: 10 (ref 5–15)
BUN: 13 mg/dL (ref 6–20)
CO2: 25 mmol/L (ref 22–32)
Calcium: 9.4 mg/dL (ref 8.9–10.3)
Chloride: 107 mmol/L (ref 98–111)
Creatinine: 0.72 mg/dL (ref 0.44–1.00)
GFR, Est AFR Am: >60 mL/min (ref >60)
GFR, Estimated: >60 mL/min (ref >60)
Glucose, Bld: 125 mg/dL — ABNORMAL HIGH (ref 70–99)
Potassium: 3.7 mmol/L (ref 3.5–5.1)
Sodium: 142 mmol/L (ref 135–145)
Total Bilirubin: 0.4 mg/dL (ref 0.3–1.2)
Total Protein: 6.4 g/dL — ABNORMAL LOW (ref 6.5–8.1)

## 2020-06-07 MED ORDER — SODIUM CHLORIDE 0.9 % IV SOLN
150.0000 mg | Freq: Once | INTRAVENOUS | Status: AC
  Administered 2020-06-07: 150 mg via INTRAVENOUS
  Filled 2020-06-07: qty 150

## 2020-06-07 MED ORDER — SODIUM CHLORIDE 0.9 % IV SOLN
10.0000 mg | Freq: Once | INTRAVENOUS | Status: AC
  Administered 2020-06-07: 10 mg via INTRAVENOUS
  Filled 2020-06-07: qty 10

## 2020-06-07 MED ORDER — PALONOSETRON HCL INJECTION 0.25 MG/5ML
INTRAVENOUS | Status: AC
  Filled 2020-06-07: qty 5

## 2020-06-07 MED ORDER — FAMOTIDINE IN NACL 20-0.9 MG/50ML-% IV SOLN
INTRAVENOUS | Status: AC
  Filled 2020-06-07: qty 50

## 2020-06-07 MED ORDER — HEPARIN SOD (PORK) LOCK FLUSH 100 UNIT/ML IV SOLN
500.0000 [IU] | Freq: Once | INTRAVENOUS | Status: AC | PRN
  Administered 2020-06-07: 500 [IU]
  Filled 2020-06-07: qty 5

## 2020-06-07 MED ORDER — FAMOTIDINE IN NACL 20-0.9 MG/50ML-% IV SOLN
20.0000 mg | Freq: Once | INTRAVENOUS | Status: AC
  Administered 2020-06-07: 20 mg via INTRAVENOUS

## 2020-06-07 MED ORDER — SODIUM CHLORIDE 0.9 % IV SOLN
Freq: Once | INTRAVENOUS | Status: AC
  Filled 2020-06-07: qty 250

## 2020-06-07 MED ORDER — SODIUM CHLORIDE 0.9% FLUSH
10.0000 mL | INTRAVENOUS | Status: DC | PRN
  Administered 2020-06-07: 10 mL
  Filled 2020-06-07: qty 10

## 2020-06-07 MED ORDER — SODIUM CHLORIDE 0.9 % IV SOLN
60.0000 mg/m2 | Freq: Once | INTRAVENOUS | Status: AC
  Administered 2020-06-07: 96 mg via INTRAVENOUS
  Filled 2020-06-07: qty 16

## 2020-06-07 MED ORDER — PALONOSETRON HCL INJECTION 0.25 MG/5ML
0.2500 mg | Freq: Once | INTRAVENOUS | Status: AC
  Administered 2020-06-07: 0.25 mg via INTRAVENOUS

## 2020-06-07 MED ORDER — SODIUM CHLORIDE 0.9 % IV SOLN
531.0000 mg | Freq: Once | INTRAVENOUS | Status: AC
  Administered 2020-06-07: 530 mg via INTRAVENOUS
  Filled 2020-06-07: qty 53

## 2020-06-07 NOTE — Assessment & Plan Note (Signed)
02/02/2020:Right breast lump tenderness. Diagnostic mammogram and [place] on 02/01/20 showed a 2.0cm mass at the 10 o'clock position with no axillary adenopathy. 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 3 indeterminate 4 and 5 mm enhancing masses (recommend MRI biopsy), single prominent right axillary lymph node (second look ultrasound and biopsy recommended) ------------------------------------------------------------------------------------------------------------------------------------------------------- Current treatment:Completed 4 cycles ofdose dense Adriamycin and Cytoxan, today cycle7Taxol with carboplatin (q 3 weeks) Echocardiogram 02/20/2020: EF 60 to 65% Chemo toxicities: 1. Chemotherapy-induced anemia: Today's hemoglobin is  2.Thrombocytopenia: Resolved 3. Fatigue due to chemotherapy  We reduced the dosage of dexamethasone to 10 mg IV.  Anxiety issues related to coming in for infusions  Continue weekly Taxol and every 3-week carboplatin treatments.

## 2020-06-07 NOTE — Patient Instructions (Signed)

## 2020-06-07 NOTE — Patient Instructions (Signed)
[facility] Discharge Instructions for Patients Receiving Chemotherapy  Today you received the following chemotherapy agents Taxol, Carboplatin  To help prevent nausea and vomiting after your treatment, we encourage you to take your nausea medication as directed  If you develop nausea and vomiting that is not controlled by your nausea medication, call the clinic.   BELOW ARE SYMPTOMS THAT SHOULD BE REPORTED IMMEDIATELY:  *FEVER GREATER THAN 100.5 F  *CHILLS WITH OR WITHOUT FEVER  NAUSEA AND VOMITING THAT IS NOT CONTROLLED WITH YOUR NAUSEA MEDICATION  *UNUSUAL SHORTNESS OF BREATH  *UNUSUAL BRUISING OR BLEEDING  TENDERNESS IN MOUTH AND THROAT WITH OR WITHOUT PRESENCE OF ULCERS  *URINARY PROBLEMS  *BOWEL PROBLEMS  UNUSUAL RASH Items with * indicate a potential emergency and should be followed up as soon as possible.  Feel free to call the clinic should you have any questions or concerns. The clinic phone number is [phone].  Please show the CHEMO ALERT CARD at check-in to the Emergency Department and triage nurse.   

## 2020-06-10 ENCOUNTER — Telehealth: Payer: Self-pay | Admitting: Hematology and Oncology

## 2020-06-10 NOTE — Telephone Encounter (Signed)
Scheduled appts per 7/23 los. Pt to get updated appt calendar at next  Visit per appt notes.

## 2020-06-14 ENCOUNTER — Inpatient Hospital Stay: Payer: 59

## 2020-06-14 ENCOUNTER — Inpatient Hospital Stay (HOSPITAL_BASED_OUTPATIENT_CLINIC_OR_DEPARTMENT_OTHER): Payer: 59 | Admitting: Adult Health

## 2020-06-14 ENCOUNTER — Other Ambulatory Visit: Payer: Self-pay

## 2020-06-14 ENCOUNTER — Inpatient Hospital Stay: Payer: 59 | Admitting: Licensed Clinical Social Worker

## 2020-06-14 ENCOUNTER — Encounter: Payer: Self-pay | Admitting: Adult Health

## 2020-06-14 ENCOUNTER — Telehealth: Payer: Self-pay | Admitting: *Deleted

## 2020-06-14 VITALS — BP 109/73 | HR 88 | Temp 99.1°F | Resp 19 | Ht 61.0 in | Wt 138.3 lb

## 2020-06-14 DIAGNOSIS — Z171 Estrogen receptor negative status [ER-]: Secondary | ICD-10-CM

## 2020-06-14 DIAGNOSIS — T451X5A Adverse effect of antineoplastic and immunosuppressive drugs, initial encounter: Secondary | ICD-10-CM | POA: Diagnosis not present

## 2020-06-14 DIAGNOSIS — C50411 Malignant neoplasm of upper-outer quadrant of right female breast: Secondary | ICD-10-CM

## 2020-06-14 DIAGNOSIS — D6481 Anemia due to antineoplastic chemotherapy: Secondary | ICD-10-CM | POA: Diagnosis not present

## 2020-06-14 DIAGNOSIS — Z5111 Encounter for antineoplastic chemotherapy: Secondary | ICD-10-CM | POA: Diagnosis not present

## 2020-06-14 DIAGNOSIS — Z801 Family history of malignant neoplasm of trachea, bronchus and lung: Secondary | ICD-10-CM | POA: Diagnosis not present

## 2020-06-14 DIAGNOSIS — Z95828 Presence of other vascular implants and grafts: Secondary | ICD-10-CM

## 2020-06-14 DIAGNOSIS — D696 Thrombocytopenia, unspecified: Secondary | ICD-10-CM | POA: Diagnosis not present

## 2020-06-14 DIAGNOSIS — Z79899 Other long term (current) drug therapy: Secondary | ICD-10-CM | POA: Diagnosis not present

## 2020-06-14 DIAGNOSIS — R5383 Other fatigue: Secondary | ICD-10-CM | POA: Diagnosis not present

## 2020-06-14 LAB — CBC WITH DIFFERENTIAL (CANCER CENTER ONLY)
Abs Immature Granulocytes: 0.02 10*3/uL (ref 0.00–0.07)
Basophils Absolute: 0 10*3/uL (ref 0.0–0.1)
Basophils Relative: 1 %
Eosinophils Absolute: 0 10*3/uL (ref 0.0–0.5)
Eosinophils Relative: 1 %
HCT: 26.2 % — ABNORMAL LOW (ref 36.0–46.0)
Hemoglobin: 8.9 g/dL — ABNORMAL LOW (ref 12.0–15.0)
Immature Granulocytes: 1 %
Lymphocytes Relative: 19 %
Lymphs Abs: 0.7 10*3/uL (ref 0.7–4.0)
MCH: 32.7 pg (ref 26.0–34.0)
MCHC: 34 g/dL (ref 30.0–36.0)
MCV: 96.3 fL (ref 80.0–100.0)
Monocytes Absolute: 0.3 10*3/uL (ref 0.1–1.0)
Monocytes Relative: 8 %
Neutro Abs: 2.6 10*3/uL (ref 1.7–7.7)
Neutrophils Relative %: 70 %
Platelet Count: 130 10*3/uL — ABNORMAL LOW (ref 150–400)
RBC: 2.72 MIL/uL — ABNORMAL LOW (ref 3.87–5.11)
RDW: 14.6 % (ref 11.5–15.5)
WBC Count: 3.7 10*3/uL — ABNORMAL LOW (ref 4.0–10.5)
nRBC: 0 % (ref 0.0–0.2)

## 2020-06-14 LAB — CMP (CANCER CENTER ONLY)
ALT: 21 U/L (ref 0–44)
AST: 16 U/L (ref 15–41)
Albumin: 3.7 g/dL (ref 3.5–5.0)
Alkaline Phosphatase: 67 U/L (ref 38–126)
Anion gap: 8 (ref 5–15)
BUN: 10 mg/dL (ref 6–20)
CO2: 26 mmol/L (ref 22–32)
Calcium: 9.7 mg/dL (ref 8.9–10.3)
Chloride: 107 mmol/L (ref 98–111)
Creatinine: 0.61 mg/dL (ref 0.44–1.00)
GFR, Est AFR Am: >60 mL/min (ref >60)
GFR, Estimated: >60 mL/min (ref >60)
Glucose, Bld: 83 mg/dL (ref 70–99)
Potassium: 3.9 mmol/L (ref 3.5–5.1)
Sodium: 141 mmol/L (ref 135–145)
Total Bilirubin: 0.4 mg/dL (ref 0.3–1.2)
Total Protein: 6.3 g/dL — ABNORMAL LOW (ref 6.5–8.1)

## 2020-06-14 MED ORDER — FAMOTIDINE IN NACL 20-0.9 MG/50ML-% IV SOLN
20.0000 mg | Freq: Once | INTRAVENOUS | Status: AC
  Administered 2020-06-14: 20 mg via INTRAVENOUS

## 2020-06-14 MED ORDER — SODIUM CHLORIDE 0.9 % IV SOLN
Freq: Once | INTRAVENOUS | Status: AC
  Filled 2020-06-14: qty 250

## 2020-06-14 MED ORDER — HEPARIN SOD (PORK) LOCK FLUSH 100 UNIT/ML IV SOLN
500.0000 [IU] | Freq: Once | INTRAVENOUS | Status: AC | PRN
  Administered 2020-06-14: 500 [IU]
  Filled 2020-06-14: qty 5

## 2020-06-14 MED ORDER — FAMOTIDINE IN NACL 20-0.9 MG/50ML-% IV SOLN
INTRAVENOUS | Status: AC
  Filled 2020-06-14: qty 50

## 2020-06-14 MED ORDER — SODIUM CHLORIDE 0.9% FLUSH
10.0000 mL | INTRAVENOUS | Status: DC | PRN
  Administered 2020-06-14: 10 mL
  Filled 2020-06-14: qty 10

## 2020-06-14 MED ORDER — SODIUM CHLORIDE 0.9 % IV SOLN
10.0000 mg | Freq: Once | INTRAVENOUS | Status: AC
  Administered 2020-06-14: 10 mg via INTRAVENOUS
  Filled 2020-06-14: qty 10

## 2020-06-14 MED ORDER — SODIUM CHLORIDE 0.9 % IV SOLN
45.0000 mg/m2 | Freq: Once | INTRAVENOUS | Status: AC
  Administered 2020-06-14: 72 mg via INTRAVENOUS
  Filled 2020-06-14: qty 12

## 2020-06-14 NOTE — Assessment & Plan Note (Signed)
02/02/2020:Right breast lump tenderness. Diagnostic mammogram and [place] on 02/01/20 showed a 2.0cm mass at the 10 o'clock position with no axillary adenopathy. 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 3 indeterminate 4 and 5 mm enhancing masses (recommend MRI biopsy), single prominent right axillary lymph node (second look ultrasound and biopsy recommended) ------------------------------------------------------------------------------------------------------------------------------------------------------- Current treatment:Completed 4 cycles ofdose dense Adriamycin and Cytoxan, today cycle8Taxol with carboplatin (q 3 weeks) Echocardiogram 02/20/2020: EF 60 to 65%  Dale was very tearful today due to the amount of energy she has lost and nasuea from her treatment.  She is very distressed about this.  I offered her a week break from chemotherapy, but she noted that she would prefer to continue on, or stop all together.  At that point she met with Dr. Lindi Adie who recommended discontinuing her carboplatin.  She will receive Taxol weekly without any further carboplatin.

## 2020-06-14 NOTE — Patient Instructions (Signed)
[facility] Discharge Instructions for Patients Receiving Chemotherapy  Today you received the following chemotherapy agents:  Taxol.  To help prevent nausea and vomiting after your treatment, we encourage you to take your nausea medication as directed.   If you develop nausea and vomiting that is not controlled by your nausea medication, call the clinic.   BELOW ARE SYMPTOMS THAT SHOULD BE REPORTED IMMEDIATELY:  *FEVER GREATER THAN 100.5 F  *CHILLS WITH OR WITHOUT FEVER  NAUSEA AND VOMITING THAT IS NOT CONTROLLED WITH YOUR NAUSEA MEDICATION  *UNUSUAL SHORTNESS OF BREATH  *UNUSUAL BRUISING OR BLEEDING  TENDERNESS IN MOUTH AND THROAT WITH OR WITHOUT PRESENCE OF ULCERS  *URINARY PROBLEMS  *BOWEL PROBLEMS  UNUSUAL RASH Items with * indicate a potential emergency and should be followed up as soon as possible.  Feel free to call the clinic should you have any questions or concerns. The clinic phone number is [phone].  Please show the CHEMO ALERT CARD at check-in to the Emergency Department and triage nurse.   

## 2020-06-14 NOTE — Progress Notes (Addendum)
[facility] Follow up:    Virginia Crews, MD [address] [place] [address]   DIAGNOSIS: Cancer Staging Malignant neoplasm of upper-outer quadrant of right breast in female, estrogen receptor negative ([place]) Staging form: Breast, AJCC 8th Edition - Clinical stage from 02/08/2020: Stage IB (cT1c, cN0, cM0, G3, ER-, PR-, HER2-) - Signed by Nicholas Lose, MD on 02/08/2020   SUMMARY OF ONCOLOGIC HISTORY: Oncology History  Malignant neoplasm of upper-outer quadrant of right breast in female, estrogen receptor negative ([place])  02/02/2020 Initial Diagnosis   Right breast lump tenderness. Diagnostic mammogram and [place] on 02/01/20 showed a 2.0cm mass at the 10 o'clock position with no axillary adenopathy. 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, ER/PR negative, Ki67 80%   02/08/2020 Cancer Staging   Staging form: Breast, AJCC 8th Edition - Clinical stage from 02/08/2020: Stage IB (cT1c, cN0, cM0, G3, ER-, PR-, HER2-) - Signed by Nicholas Lose, MD on 02/08/2020   02/23/2020 -  Chemotherapy   The patient had dexamethasone (DECADRON) 4 MG tablet, 1 of 1 cycle, Start date: 02/08/2020, End date: 04/22/2020 DOXOrubicin (ADRIAMYCIN) chemo injection 98 mg, 60 mg/m2 = 98 mg, Intravenous,  Once, 4 of 4 cycles Administration: 98 mg (02/23/2020), 98 mg (03/08/2020), 98 mg (03/22/2020), 98 mg (04/05/2020) palonosetron (ALOXI) injection 0.25 mg, 0.25 mg, Intravenous,  Once, 7 of 8 cycles Administration: 0.25 mg (02/23/2020), 0.25 mg (04/22/2020), 0.25 mg (03/08/2020), 0.25 mg (03/22/2020), 0.25 mg (04/05/2020), 0.25 mg (05/17/2020), 0.25 mg (06/07/2020) pegfilgrastim (NEULASTA ONPRO KIT) injection 6 mg, 6 mg, Subcutaneous, Once, 4 of 4 cycles Administration: 6 mg (02/23/2020), 6 mg (03/08/2020), 6 mg (03/22/2020), 6 mg (04/05/2020) CARBOplatin (PARAPLATIN) 630 mg in sodium chloride 0.9 % 250 mL chemo infusion, 700 mg (100 % of original dose 700 mg), Intravenous,  Once, 3 of 4  cycles Dose modification: 700 mg (original dose 700 mg, Cycle 5), 637.2 mg (original dose 700 mg, Cycle 6, Reason: Change in SCr/CrCl),   (original dose 700 mg, Cycle 6, Reason: Dose not tolerated) Administration: 630 mg (04/22/2020), 530 mg (05/17/2020), 530 mg (06/07/2020) cyclophosphamide (CYTOXAN) 980 mg in sodium chloride 0.9 % 250 mL chemo infusion, 600 mg/m2 = 980 mg, Intravenous,  Once, 4 of 4 cycles Administration: 980 mg (02/23/2020), 980 mg (03/08/2020), 980 mg (03/22/2020), 980 mg (04/05/2020) PACLitaxel (TAXOL) 132 mg in sodium chloride 0.9 % 250 mL chemo infusion (</= 53m/m2), 80 mg/m2 = 132 mg, Intravenous,  Once, 3 of 4 cycles Dose modification: 60 mg/m2 (original dose 80 mg/m2, Cycle 5, Reason: Dose not tolerated), 45 mg/m2 (original dose 80 mg/m2, Cycle 7, Reason: Provider Judgment) Administration: 132 mg (04/22/2020), 132 mg (05/03/2020), 96 mg (05/09/2020), 96 mg (05/17/2020), 96 mg (05/24/2020), 96 mg (05/31/2020), 96 mg (06/07/2020) fosaprepitant (EMEND) 150 mg in sodium chloride 0.9 % 145 mL IVPB, 150 mg, Intravenous,  Once, 7 of 8 cycles Administration: 150 mg (02/23/2020), 150 mg (04/22/2020), 150 mg (03/08/2020), 150 mg (03/22/2020), 150 mg (04/05/2020), 150 mg (05/17/2020), 150 mg (06/07/2020)  for chemotherapy treatment.     Genetic Testing   Negative genetic testing. No pathogenic variants identified on the [facility] Common Hereditary Cancers Panel. The report date is 02/25/2020.   The Common Hereditary Cancers Panel offered by [facility] includes sequencing and/or deletion duplication testing of the following 48 genes: APC, ATM, AXIN2, BARD1, BMPR1A, BRCA1, BRCA2, BRIP1, CDH1, CDKN2A (p14ARF), CDKN2A (p16INK4a), CKD4, CHEK2, CTNNA1, DICER1, EPCAM (Deletion/duplication testing only), GREM1 (promoter region deletion/duplication testing only), KIT,  MEN1, MLH1, MSH2, MSH3, MSH6, MUTYH, NBN, NF1, NHTL1, PALB2, PDGFRA, PMS2, POLD1, POLE, PTEN, RAD50, RAD51C, RAD51D, RNF43, SDHB, SDHC, SDHD, SMAD4, SMARCA4. STK11,  TP53, TSC1, TSC2, and VHL.  The following genes were evaluated for sequence changes only: SDHA and HOXB13 c.251G>A variant only.     CURRENT THERAPY: Taxol/Carbo  INTERVAL HISTORY: Virginia [name] 54 y.o. female returns for evaluation prior to receiving her weekly Taxol.  She is tearful and upset about the degree of nausea she has felt this past week, along with fatigue.  She notes that she has been tired since March and is ready to be finished with her chemotherapy.  She notes that her nausea medication makes her constipation worse, and she thinks it is more significant after receiving the Carboplatin.  She denies peripheral neuropathy.   Patient Active Problem List   Diagnosis Date Noted  . Port-A-Cath in place 03/22/2020  . Genetic testing 02/26/2020  . Family history of pancreatic cancer   . Family history of breast cancer   . Family history of lung cancer   . Malignant neoplasm of upper-outer quadrant of right breast in female, estrogen receptor negative ([place]) 02/08/2020  . Special screening for malignant neoplasms, colon   . Disordered eating 09/30/2017  . Avitaminosis D 09/30/2017  . Family history of thyroid disease 09/30/2017  . Fibroid 08/31/2017  . Irregular bleeding 08/19/2017  . Arthralgia of hip 10/07/2015    has No Known Allergies.  MEDICAL HISTORY: Past Medical History:  Diagnosis Date  . Anorexia   . Anxiety   . Arthritis    knees  . Family history of breast cancer   . Family history of lung cancer   . Family history of pancreatic cancer   . Fibroids   . Wears glasses     SURGICAL HISTORY: Past Surgical History:  Procedure Laterality Date  . CLEFT PALATE REPAIR    . CLEFT PALATE REPAIR  1970  . COLONOSCOPY WITH PROPOFOL N/A 04/25/2018   Procedure: COLONOSCOPY WITH PROPOFOL;  Surgeon: [name], MD;  Location: [facility];  Service: Endoscopy;  Laterality: N/A;  . PORTACATH PLACEMENT Right 02/22/2020   Procedure: INSERTION PORT-A-CATH WITH  ULTRASOUND GUIDANCE;  Surgeon: Erroll Luna, MD;  Location: [facility];  Service: General;  Laterality: Right;    SOCIAL HISTORY: Social History   Socioeconomic History  . Marital status: Married    Spouse name: Luretha Rued  . Number of children: 2  . Years of education: 16  . Highest education level: Bachelor's degree (e.g., BA, AB, BS)  Occupational History  . Occupation: works on [employer]  Tobacco Use  . Smoking status: Never Smoker  . Smokeless tobacco: Never Used  Vaping Use  . Vaping Use: Never used  Substance and Sexual Activity  . Alcohol use: No    Alcohol/week: 0.0 standard drinks  . Drug use: No  . Sexual activity: Yes    Partners: Male    Birth control/protection: Surgical    Comment: husband s/p vasectomy  Other Topics Concern  . Not on file  Social History Narrative  . Not on file   Social Determinants of Health   Financial Resource Strain:   . Difficulty of Paying Living Expenses:   Food Insecurity:   . Worried About Charity fundraiser in the Last Year:   . Arboriculturist in the Last Year:   Transportation Needs:   . Film/video editor (Medical):   Marland Kitchen Lack of  Transportation (Non-Medical):   Physical Activity:   . Days of Exercise per Week:   . Minutes of Exercise per Session:   Stress:   . Feeling of Stress :   Social Connections:   . Frequency of Communication with Friends and Family:   . Frequency of Social Gatherings with Friends and Family:   . Attends Religious Services:   . Active Member of Clubs or Organizations:   . Attends Archivist Meetings:   Marland Kitchen Marital Status:   Intimate Partner Violence:   . Fear of Current or Ex-Partner:   . Emotionally Abused:   Marland Kitchen Physically Abused:   . Sexually Abused:     FAMILY HISTORY: Family History  Problem Relation Age of Onset  . Anxiety disorder Mother   . Heart disease Father 34       CABG x4  . Diabetes Father   . Hypertension Father   . Hypothyroidism Father    . Anxiety disorder Daughter   . Heart disease Maternal Grandmother   . Stroke Maternal Grandmother 68  . Heart disease Paternal Aunt   . Stroke Paternal Aunt   . Heart disease Paternal Uncle   . Pancreatic cancer Paternal Uncle        dx 64s  . Hypertension Sister   . Hyperlipidemia Sister   . Hypothyroidism Sister   . Lung cancer Maternal Uncle   . Healthy Son   . Breast cancer Cousin   . Lung cancer Paternal Aunt   . Colon cancer Neg Hx   . Ovarian cancer Neg Hx   . Cervical cancer Neg Hx     Review of Systems  Constitutional: Positive for appetite change and fatigue. Negative for chills, diaphoresis, fever and unexpected weight change.  HENT:   Negative for hearing loss, lump/mass and mouth sores.   Eyes: Negative for eye problems and icterus.  Respiratory: Negative for chest tightness, cough and shortness of breath.   Cardiovascular: Negative for chest pain, leg swelling and palpitations.  Gastrointestinal: Positive for constipation and nausea. Negative for abdominal distention, abdominal pain, diarrhea, rectal pain and vomiting.  Endocrine: Negative for hot flashes.  Genitourinary: Negative for difficulty urinating.   Musculoskeletal: Negative for arthralgias and neck stiffness.  Skin: Negative for itching and rash.  Neurological: Negative for dizziness, extremity weakness, headaches and numbness.  Hematological: Negative for adenopathy. Does not bruise/bleed easily.  Psychiatric/Behavioral: Negative for decreased concentration, depression, sleep disturbance and suicidal ideas. The patient is nervous/anxious.       PHYSICAL EXAMINATION  ECOG PERFORMANCE STATUS: 1 - Symptomatic but completely ambulatory  Vitals:   06/14/20 1241  BP: 109/73  Pulse: 88  Resp: 19  Temp: 99.1 F (37.3 C)  SpO2: 100%    Physical Exam Constitutional:      General: She is not in acute distress.    Appearance: Normal appearance. She is not toxic-appearing.  HENT:     Head:  Normocephalic and atraumatic.  Eyes:     General: No scleral icterus. Musculoskeletal:        General: No swelling.  Skin:    General: Skin is warm and dry.     Findings: No rash.  Neurological:     General: No focal deficit present.     Mental Status: She is alert.  Psychiatric:     Comments: Patient was tearful and upset during initial appointment due to the intensity of her symptoms.      LABORATORY DATA:  CBC    Component  Value Date/Time   WBC 3.7 (L) 06/14/2020 1230   WBC 7.3 02/19/2020 1600   RBC 2.72 (L) 06/14/2020 1230   HGB 8.9 (L) 06/14/2020 1230   HGB 13.0 04/20/2019 0823   HCT 26.2 (L) 06/14/2020 1230   HCT 39.0 04/20/2019 0823   PLT 130 (L) 06/14/2020 1230   PLT 178 04/20/2019 0823   MCV 96.3 06/14/2020 1230   MCV 89 04/20/2019 0823   MCH 32.7 06/14/2020 1230   MCHC 34.0 06/14/2020 1230   RDW 14.6 06/14/2020 1230   RDW 11.9 04/20/2019 0823   LYMPHSABS 0.7 06/14/2020 1230   MONOABS 0.3 06/14/2020 1230   EOSABS 0.0 06/14/2020 1230   BASOSABS 0.0 06/14/2020 1230    CMP     Component Value Date/Time   NA 141 06/14/2020 1230   NA 144 04/20/2019 0823   K 3.9 06/14/2020 1230   CL 107 06/14/2020 1230   CO2 26 06/14/2020 1230   GLUCOSE 83 06/14/2020 1230   BUN 10 06/14/2020 1230   BUN 21 04/20/2019 0823   CREATININE 0.61 06/14/2020 1230   CREATININE 0.78 09/30/2017 1502   CALCIUM 9.7 06/14/2020 1230   PROT 6.3 (L) 06/14/2020 1230   PROT 6.6 04/20/2019 0823   ALBUMIN 3.7 06/14/2020 1230   ALBUMIN 4.6 04/20/2019 0823   AST 16 06/14/2020 1230   ALT 21 06/14/2020 1230   ALKPHOS 67 06/14/2020 1230   BILITOT 0.4 06/14/2020 1230   GFRNONAA >60 06/14/2020 1230   GFRNONAA 89 09/30/2017 1502   GFRAA >60 06/14/2020 1230   GFRAA 103 09/30/2017 1502       ASSESSMENT and PLAN:   Malignant neoplasm of upper-outer quadrant of right breast in female, estrogen receptor negative ([place]) 02/02/2020:Right breast lump tenderness. Diagnostic mammogram and [place] on  02/01/20 showed a 2.0cm mass at the 10 o'clock position with no axillary adenopathy. 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 3 indeterminate 4 and 5 mm enhancing masses (recommend MRI biopsy), single prominent right axillary lymph node (second look ultrasound and biopsy recommended) ------------------------------------------------------------------------------------------------------------------------------------------------------- Current treatment:Completed 4 cycles ofdose dense Adriamycin and Cytoxan, today cycle8Taxol with carboplatin (q 3 weeks) Echocardiogram 02/20/2020: EF 60 to 65%  Virginia [name] was very tearful today due to the amount of energy she has lost and nasuea from her treatment.  She is very distressed about this.  I offered her a week break from chemotherapy, but she noted that she would prefer to continue on, or stop all together.  At that point she met with Dr. Lindi Adie who recommended discontinuing her carboplatin.  She will receive Taxol weekly without any further carboplatin.     No orders of the defined types were placed in this encounter.   All questions were answered. The patient knows to call the clinic with any problems, questions or concerns. We can certainly see the patient much sooner if necessary. This note was electronically signed. Scot Dock, NP 06/14/2020   Attending Note  I personally saw and examined Virginia [name]. The plan of care was discussed with her. I agree with the  assessment and plan as documented  above. Severe toxicities from chemotherapy: We discontinued carboplatin any further and reduce the dosage of Taxol. Profound nausea and profound fatigue are her biggest side effects. Emotionally patient is unable to tolerate the chemotherapy. I anticipate that we may be stopping her treatment very soon. Signed Harriette Ohara, MD

## 2020-06-14 NOTE — Progress Notes (Signed)
[facility] CSW Progress Note  Holiday representative met with patient to provide ongoing emotional support. Patient reports having had a very difficult week with significant, non-stop nausea to the point of it impacting her mood. CSW provided empathic listening. Patient was able today to use cognitive reframing and relied on her faith to help her cope. She is starting to feel better after that and speaking with her doctors. CSW encouraged use of her regular coping skills and reaching out to members of the team when she has concerns.   CSW will continue to follow patient during treatment. Will see in infusion in 2 weeks unless treatment ends early, then will follow by phone or during med onc appointments.   Edwinna Areola Joseeduardo Brix LCSW

## 2020-06-14 NOTE — Patient Instructions (Signed)

## 2020-06-14 NOTE — Telephone Encounter (Signed)
Pt called with c/o nausea and vomiting as well as fingernails sore extremely sore with some neuropathy noted to fingers. She relayed that she is feeling very down.  Pt scheduled to see Mendel Ryder prior to chemo. Msg relayed with pt symptoms.

## 2020-06-17 ENCOUNTER — Telehealth: Payer: Self-pay | Admitting: Adult Health

## 2020-06-17 NOTE — Telephone Encounter (Signed)
No 7/30 los. No changes made to pt's schedule.

## 2020-06-20 MED FILL — Dexamethasone Sodium Phosphate Inj 100 MG/10ML: INTRAMUSCULAR | Qty: 1 | Status: AC

## 2020-06-20 NOTE — Progress Notes (Signed)
Patient Care Team: Virginia Crews, MD as PCP - General (Family Medicine) Mauro Kaufmann, RN as Oncology Nurse Navigator Rockwell Germany, RN as Oncology Nurse Navigator  DIAGNOSIS:    ICD-10-CM   1. Malignant neoplasm of upper-outer quadrant of right breast in female, estrogen receptor negative ([place])  C50.411 MR BREAST BILATERAL W [place] CAD   Z17.1     SUMMARY OF ONCOLOGIC HISTORY: Oncology History  Malignant neoplasm of upper-outer quadrant of right breast in female, estrogen receptor negative ([place])  02/02/2020 Initial Diagnosis   Right breast lump tenderness. Diagnostic mammogram and [place] on 02/01/20 showed a 2.0cm mass at the 10 o'clock position with no axillary adenopathy. 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, ER/PR negative, Ki67 80%   02/08/2020 Cancer Staging   Staging form: Breast, AJCC 8th Edition - Clinical stage from 02/08/2020: Stage IB (cT1c, cN0, cM0, G3, ER-, PR-, HER2-) - Signed by Nicholas Lose, MD on 02/08/2020   02/23/2020 -  Chemotherapy   The patient had dexamethasone (DECADRON) 4 MG tablet, 1 of 1 cycle, Start date: 02/08/2020, End date: 04/22/2020 DOXOrubicin (ADRIAMYCIN) chemo injection 98 mg, 60 mg/m2 = 98 mg, Intravenous,  Once, 4 of 4 cycles Administration: 98 mg (02/23/2020), 98 mg (03/08/2020), 98 mg (03/22/2020), 98 mg (04/05/2020) palonosetron (ALOXI) injection 0.25 mg, 0.25 mg, Intravenous,  Once, 7 of 8 cycles Administration: 0.25 mg (02/23/2020), 0.25 mg (04/22/2020), 0.25 mg (03/08/2020), 0.25 mg (03/22/2020), 0.25 mg (04/05/2020), 0.25 mg (05/17/2020), 0.25 mg (06/07/2020) pegfilgrastim (NEULASTA ONPRO KIT) injection 6 mg, 6 mg, Subcutaneous, Once, 4 of 4 cycles Administration: 6 mg (02/23/2020), 6 mg (03/08/2020), 6 mg (03/22/2020), 6 mg (04/05/2020) CARBOplatin (PARAPLATIN) 630 mg in sodium chloride 0.9 % 250 mL chemo infusion, 700 mg (100 % of original dose 700 mg), Intravenous,  Once, 3 of 3 cycles Dose modification: 700 mg  (original dose 700 mg, Cycle 5), 637.2 mg (original dose 700 mg, Cycle 6, Reason: Change in SCr/CrCl),   (original dose 700 mg, Cycle 6, Reason: Dose not tolerated) Administration: 630 mg (04/22/2020), 530 mg (05/17/2020), 530 mg (06/07/2020) cyclophosphamide (CYTOXAN) 980 mg in sodium chloride 0.9 % 250 mL chemo infusion, 600 mg/m2 = 980 mg, Intravenous,  Once, 4 of 4 cycles Administration: 980 mg (02/23/2020), 980 mg (03/08/2020), 980 mg (03/22/2020), 980 mg (04/05/2020) PACLitaxel (TAXOL) 132 mg in sodium chloride 0.9 % 250 mL chemo infusion (</= 7m/m2), 80 mg/m2 = 132 mg, Intravenous,  Once, 3 of 4 cycles Dose modification: 60 mg/m2 (original dose 80 mg/m2, Cycle 5, Reason: Dose not tolerated), 45 mg/m2 (original dose 80 mg/m2, Cycle 7, Reason: Provider Judgment) Administration: 132 mg (04/22/2020), 132 mg (05/03/2020), 96 mg (05/09/2020), 96 mg (05/17/2020), 96 mg (05/24/2020), 96 mg (05/31/2020), 96 mg (06/07/2020), 72 mg (06/14/2020) fosaprepitant (EMEND) 150 mg in sodium chloride 0.9 % 145 mL IVPB, 150 mg, Intravenous,  Once, 7 of 8 cycles Administration: 150 mg (02/23/2020), 150 mg (04/22/2020), 150 mg (03/08/2020), 150 mg (03/22/2020), 150 mg (04/05/2020), 150 mg (05/17/2020), 150 mg (06/07/2020)  for chemotherapy treatment.     Genetic Testing   Negative genetic testing. No pathogenic variants identified on the [facility] Common Hereditary Cancers Panel. The report date is 02/25/2020.   The Common Hereditary Cancers Panel offered by [facility] includes sequencing and/or deletion duplication testing of the following 48 genes: APC, ATM, AXIN2, BARD1, BMPR1A, BRCA1, BRCA2, BRIP1, CDH1, CDKN2A (p14ARF), CDKN2A (p16INK4a), CKD4, CHEK2, CTNNA1, DICER1, EPCAM (Deletion/duplication testing only), GREM1 (promoter region deletion/duplication testing  only), KIT, MEN1, MLH1, MSH2, MSH3, MSH6, MUTYH, NBN, NF1, NHTL1, PALB2, PDGFRA, PMS2, POLD1, POLE, PTEN, RAD50, RAD51C, RAD51D, RNF43, SDHB, SDHC, SDHD, SMAD4, SMARCA4. STK11, TP53, TSC1,  TSC2, and VHL.  The following genes were evaluated for sequence changes only: SDHA and HOXB13 c.251G>A variant only.     CHIEF COMPLIANT: Cycle9Taxol   INTERVAL HISTORY: Virginia [name] is a 54 y.o. with above-mentioned history of breast cancer currently on neoadjuvant chemotherapywithweekly Taxol and Carboplatin after completing 4 cycles ofdose dense Adriamycin and Cytoxan. She presents to the clinic todayforcycle9. She has done better than last week in terms of her side effects but she continues to have fatigue and nausea vomiting.  She does not take anything for nausea because it causes constipation.  She started noticing neuropathy in her hands and toes.  ALLERGIES:  has No Known Allergies.  MEDICATIONS:  Current Outpatient Medications  Medication Sig Dispense Refill  . escitalopram (LEXAPRO) 20 MG tablet TAKE 1 TABLET (20 MG TOTAL) BY MOUTH DAILY. 90 tablet 1  . lidocaine-prilocaine (EMLA) cream Apply to affected area once 30 g 3   No current facility-administered medications for this visit.    PHYSICAL EXAMINATION: ECOG PERFORMANCE STATUS: 1 - Symptomatic but completely ambulatory  Vitals:   06/21/20 1057  BP: 114/70  Pulse: 95  Resp: 17  Temp: 98.9 F (37.2 C)  SpO2: 100%   Filed Weights   06/21/20 1057  Weight: 137 lb 12.8 oz (62.5 kg)    LABORATORY DATA:  I have reviewed the data as listed CMP Latest Ref Rng & Units 06/21/2020 06/14/2020 06/07/2020  Glucose 70 - 99 mg/dL 87 83 125(H)  BUN 6 - 20 mg/dL '13 10 13  ' Creatinine 0.44 - 1.00 mg/dL 0.66 0.61 0.72  Sodium 135 - 145 mmol/L 138 141 142  Potassium 3.5 - 5.1 mmol/L 4.0 3.9 3.7  Chloride 98 - 111 mmol/L 106 107 107  CO2 22 - 32 mmol/L '25 26 25  ' Calcium 8.9 - 10.3 mg/dL 9.5 9.7 9.4  Total Protein 6.5 - 8.1 g/dL 6.3(L) 6.3(L) 6.4(L)  Total Bilirubin 0.3 - 1.2 mg/dL 0.4 0.4 0.4  Alkaline Phos 38 - 126 U/L 67 67 76  AST 15 - 41 U/L 37 16 33  ALT 0 - 44 U/L 52(H) 21 47(H)    Lab Results  Component  Value Date   WBC 2.5 (L) 06/21/2020   HGB 8.5 (L) 06/21/2020   HCT 25.1 (L) 06/21/2020   MCV 98.0 06/21/2020   PLT 164 06/21/2020   NEUTROABS 1.2 (L) 06/21/2020    ASSESSMENT & PLAN:  Malignant neoplasm of upper-outer quadrant of right breast in female, estrogen receptor negative ([facility]) 02/02/2020:Right breast lump tenderness. Diagnostic mammogram and [place] on 02/01/20 showed a 2.0cm mass at the 10 o'clock position with no axillary adenopathy. 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 3 indeterminate 4 and 5 mm enhancing masses (recommend MRI biopsy), single prominent right axillary lymph node (second look ultrasound and biopsy recommended) ------------------------------------------------------------------------------------------------------------------------------------------------------- Current treatment:Completed 4 cycles ofdose dense Adriamycin and Cytoxan,  discontinuing her treatment Echocardiogram 02/20/2020: EF 60 to 65%  Chemo toxicities: 1. Chemotherapy-induced anemia: Today's hemoglobin is 8.5 2.Thrombocytopenia: 105 3.Fatigue due to chemotherapy 4.  Depression/anxiety 5.  Chemo-induced peripheral neuropathy 6.  Leukopenia: ANC 1.2  I recommended discontinuation of further chemotherapy. We will set her up for breast MRI next week and I will see her after that. I sent a message to Dr. Brantley Stage and Dr. Iran Planas to see if they would want to plan her surgery sooner.   Orders Placed This Encounter    Procedures  . MR BREAST BILATERAL W WO CONTRAST INC CAD    Standing Status:   Future    Standing Expiration Date:   06/21/2021    Order Specific Question:   If indicated for the ordered procedure, I authorize the administration of contrast media per Radiology protocol    Answer:   Yes    Order Specific Question:   What is the patient's sedation requirement?    Answer:   No Sedation    Order Specific Question:   Does the patient have a pacemaker or implanted devices?    Answer:   Yes    Order Specific Question:   Radiology Contrast Protocol - do NOT remove file path    Answer:   [email or web address]    Order Specific Question:   Preferred imaging location?    Answer:   [facility] (table limit-550lbs)   The patient has a good understanding of the overall plan. she agrees with it. she will call with any problems that may develop before the next visit here.  Total time spent: 30 mins including face to face time and time spent for planning, charting and coordination of care  Nicholas Lose, MD 06/21/2020  I, Cloyde Reams Dorshimer, am acting as scribe for Dr. Nicholas Lose.  I have reviewed the above documentation for accuracy and completeness, and I agree with the above.

## 2020-06-21 ENCOUNTER — Inpatient Hospital Stay: Payer: 59

## 2020-06-21 ENCOUNTER — Telehealth: Payer: Self-pay | Admitting: *Deleted

## 2020-06-21 ENCOUNTER — Inpatient Hospital Stay: Payer: 59 | Attending: Hematology and Oncology | Admitting: Hematology and Oncology

## 2020-06-21 ENCOUNTER — Other Ambulatory Visit: Payer: Self-pay

## 2020-06-21 ENCOUNTER — Encounter: Payer: Self-pay | Admitting: *Deleted

## 2020-06-21 DIAGNOSIS — R112 Nausea with vomiting, unspecified: Secondary | ICD-10-CM | POA: Diagnosis not present

## 2020-06-21 DIAGNOSIS — F418 Other specified anxiety disorders: Secondary | ICD-10-CM | POA: Insufficient documentation

## 2020-06-21 DIAGNOSIS — T451X5A Adverse effect of antineoplastic and immunosuppressive drugs, initial encounter: Secondary | ICD-10-CM | POA: Diagnosis not present

## 2020-06-21 DIAGNOSIS — G620 Drug-induced polyneuropathy: Secondary | ICD-10-CM | POA: Insufficient documentation

## 2020-06-21 DIAGNOSIS — D6481 Anemia due to antineoplastic chemotherapy: Secondary | ICD-10-CM | POA: Insufficient documentation

## 2020-06-21 DIAGNOSIS — D72819 Decreased white blood cell count, unspecified: Secondary | ICD-10-CM | POA: Diagnosis not present

## 2020-06-21 DIAGNOSIS — C50411 Malignant neoplasm of upper-outer quadrant of right female breast: Secondary | ICD-10-CM | POA: Insufficient documentation

## 2020-06-21 DIAGNOSIS — Z171 Estrogen receptor negative status [ER-]: Secondary | ICD-10-CM | POA: Insufficient documentation

## 2020-06-21 DIAGNOSIS — R5383 Other fatigue: Secondary | ICD-10-CM | POA: Insufficient documentation

## 2020-06-21 DIAGNOSIS — D696 Thrombocytopenia, unspecified: Secondary | ICD-10-CM | POA: Insufficient documentation

## 2020-06-21 DIAGNOSIS — Z95828 Presence of other vascular implants and grafts: Secondary | ICD-10-CM

## 2020-06-21 DIAGNOSIS — Z79899 Other long term (current) drug therapy: Secondary | ICD-10-CM | POA: Diagnosis not present

## 2020-06-21 LAB — CBC WITH DIFFERENTIAL (CANCER CENTER ONLY)
Abs Immature Granulocytes: 0.01 10*3/uL (ref 0.00–0.07)
Basophils Absolute: 0 10*3/uL (ref 0.0–0.1)
Basophils Relative: 0 %
Eosinophils Absolute: 0 10*3/uL (ref 0.0–0.5)
Eosinophils Relative: 2 %
HCT: 25.1 % — ABNORMAL LOW (ref 36.0–46.0)
Hemoglobin: 8.5 g/dL — ABNORMAL LOW (ref 12.0–15.0)
Immature Granulocytes: 0 %
Lymphocytes Relative: 29 %
Lymphs Abs: 0.7 10*3/uL (ref 0.7–4.0)
MCH: 33.2 pg (ref 26.0–34.0)
MCHC: 33.9 g/dL (ref 30.0–36.0)
MCV: 98 fL (ref 80.0–100.0)
Monocytes Absolute: 0.5 10*3/uL (ref 0.1–1.0)
Monocytes Relative: 19 %
Neutro Abs: 1.2 10*3/uL — ABNORMAL LOW (ref 1.7–7.7)
Neutrophils Relative %: 50 %
Platelet Count: 164 10*3/uL (ref 150–400)
RBC: 2.56 MIL/uL — ABNORMAL LOW (ref 3.87–5.11)
RDW: 15 % (ref 11.5–15.5)
WBC Count: 2.5 10*3/uL — ABNORMAL LOW (ref 4.0–10.5)
nRBC: 0 % (ref 0.0–0.2)

## 2020-06-21 LAB — CMP (CANCER CENTER ONLY)
ALT: 52 U/L — ABNORMAL HIGH (ref 0–44)
AST: 37 U/L (ref 15–41)
Albumin: 3.8 g/dL (ref 3.5–5.0)
Alkaline Phosphatase: 67 U/L (ref 38–126)
Anion gap: 7 (ref 5–15)
BUN: 13 mg/dL (ref 6–20)
CO2: 25 mmol/L (ref 22–32)
Calcium: 9.5 mg/dL (ref 8.9–10.3)
Chloride: 106 mmol/L (ref 98–111)
Creatinine: 0.66 mg/dL (ref 0.44–1.00)
GFR, Est AFR Am: >60 mL/min (ref >60)
GFR, Estimated: >60 mL/min (ref >60)
Glucose, Bld: 87 mg/dL (ref 70–99)
Potassium: 4 mmol/L (ref 3.5–5.1)
Sodium: 138 mmol/L (ref 135–145)
Total Bilirubin: 0.4 mg/dL (ref 0.3–1.2)
Total Protein: 6.3 g/dL — ABNORMAL LOW (ref 6.5–8.1)

## 2020-06-21 MED ORDER — SODIUM CHLORIDE 0.9% FLUSH
10.0000 mL | INTRAVENOUS | Status: DC | PRN
  Administered 2020-06-21: 10 mL
  Filled 2020-06-21: qty 10

## 2020-06-21 NOTE — Patient Instructions (Signed)

## 2020-06-21 NOTE — Assessment & Plan Note (Signed)
02/02/2020:Right breast lump tenderness. Diagnostic mammogram and [place] on 02/01/20 showed a 2.0cm mass at the 10 o'clock position with no axillary adenopathy. 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 3 indeterminate 4 and 5 mm enhancing masses (recommend MRI biopsy), single prominent right axillary lymph node (second look ultrasound and biopsy recommended) ------------------------------------------------------------------------------------------------------------------------------------------------------- Current treatment:Completed 4 cycles ofdose dense Adriamycin and Cytoxan, today cycle9Taxol with carboplatin (q 3 weeks) Echocardiogram 02/20/2020: EF 60 to 65%  Chemo toxicities: 1. Chemotherapy-induced anemia: Today's hemoglobin is 9.2 2.Thrombocytopenia: 105 3.Fatigue due to chemotherapy 4.  Depression/anxiety

## 2020-06-21 NOTE — Telephone Encounter (Signed)
Called pt to congratulate on completion of chemo.  Discussed next step is breast MRI at GI followed by surgery. Denies further needs or questions at this time.

## 2020-06-24 ENCOUNTER — Telehealth: Payer: Self-pay | Admitting: Hematology and Oncology

## 2020-06-24 ENCOUNTER — Encounter: Payer: Self-pay | Admitting: *Deleted

## 2020-06-24 NOTE — Telephone Encounter (Signed)
Cancelled and scheduled appts per 8/6 los. Pt confirmed cancellations and new appt date and time.

## 2020-06-25 ENCOUNTER — Encounter: Payer: Self-pay | Admitting: *Deleted

## 2020-06-27 ENCOUNTER — Encounter: Payer: Self-pay | Admitting: *Deleted

## 2020-06-28 ENCOUNTER — Ambulatory Visit
Admission: RE | Admit: 2020-06-28 | Discharge: 2020-06-28 | Disposition: A | Discharge status: Home / Self Care | Payer: 59 | Source: Ambulatory Visit | Attending: Hematology and Oncology | Admitting: Hematology and Oncology

## 2020-06-28 ENCOUNTER — Other Ambulatory Visit: Payer: Self-pay

## 2020-06-28 ENCOUNTER — Ambulatory Visit: Payer: 59

## 2020-06-28 ENCOUNTER — Ambulatory Visit: Payer: 59 | Admitting: Hematology and Oncology

## 2020-06-28 ENCOUNTER — Other Ambulatory Visit: Payer: 59

## 2020-06-28 DIAGNOSIS — C50411 Malignant neoplasm of upper-outer quadrant of right female breast: Secondary | ICD-10-CM

## 2020-06-28 DIAGNOSIS — Z853 Personal history of malignant neoplasm of breast: Secondary | ICD-10-CM | POA: Diagnosis not present

## 2020-06-28 IMAGING — MR MR BREAST BILAT WO/W CM
8 of 12 series · 33 of 48 positions shown · IV contrast (gadavist)
Comparison: MRI [DATE]

CLINICAL DATA: Assess breast cancer after neoadjuvant chemotherapy.

LABS:  None
EXAM:
BILATERAL BREAST MRI WITH AND WITHOUT CONTRAST
TECHNIQUE: Multiplanar, multisequence MR images of both breasts were obtained
prior to and following the intravenous administration of 6 ml of
Gadavist

[Series 2: t2_tirm_tra ipat (a-p) · axial · 3.0mm · 0.66mm/px · 1 of 55 slices shown]
[im 1/55]
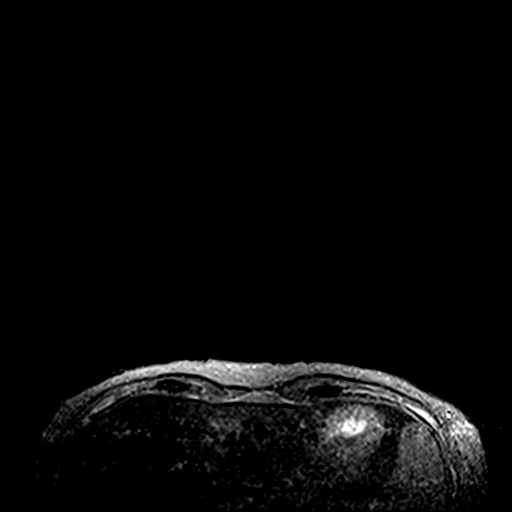

[Series 3: fl3d pre-cm no · axial · non-contrast · 1.2mm · 0.89mm/px · z∈[-105,+66]mm · 5 of 144 slices shown]
[im 1/144]
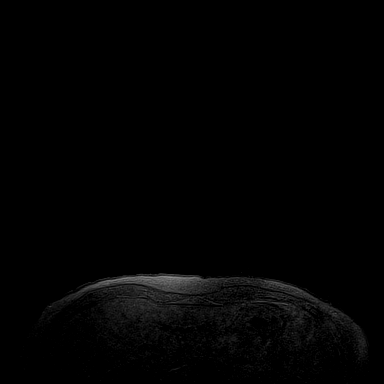
[im 36/144]
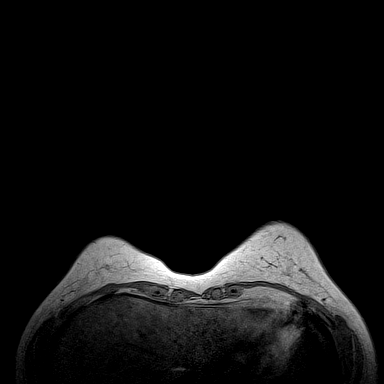
[im 72/144]
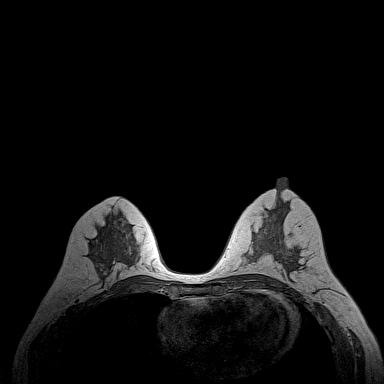
[im 108/144]
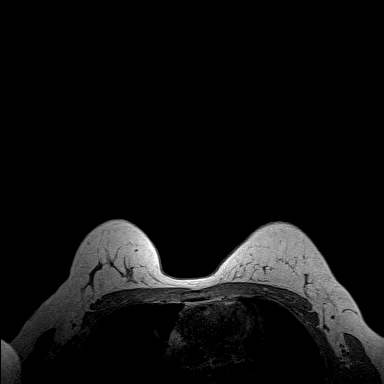
[im 144/144]
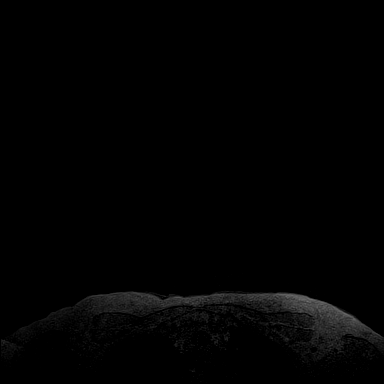

[Series 4: fl3d pre-cm · axial · non-contrast · 1.2mm · 0.89mm/px · z∈[-105,+66]mm · 5 of 144 slices shown]
[im 1/144]
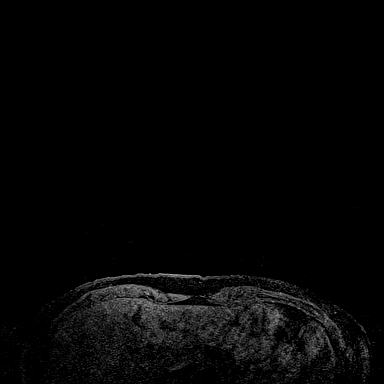
[im 36/144]
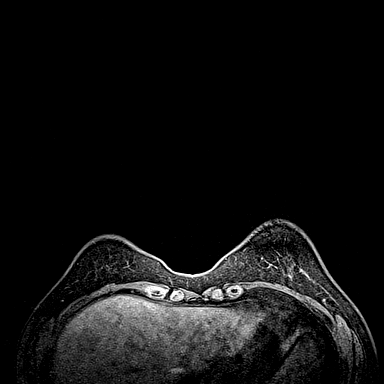
[im 72/144]
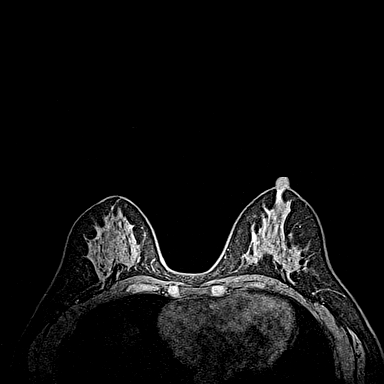
[im 108/144]
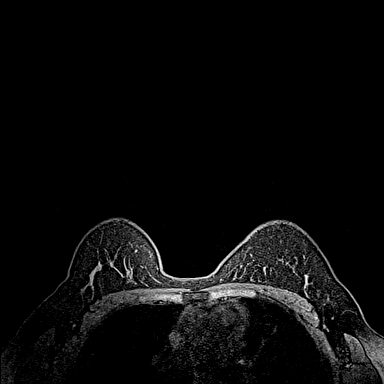
[im 144/144]
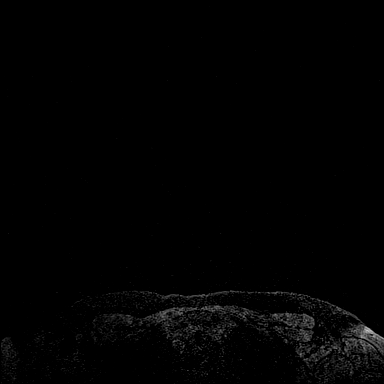

[Series 5: fl3d post-cm 20 · axial · 1.2mm · 0.89mm/px · z∈[-105,+66]mm · 5 of 144 slices shown (1 of 3)]
[im 1/144]
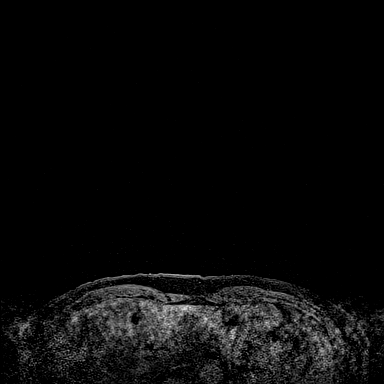
[im 36/144]
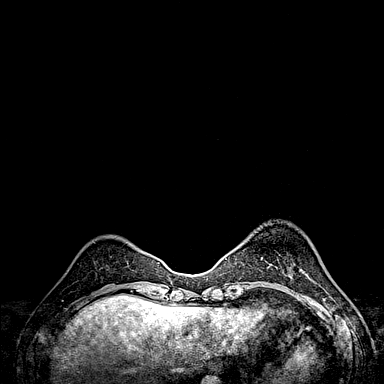
[im 72/144]
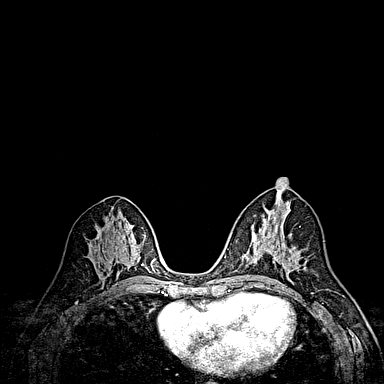
[im 108/144]
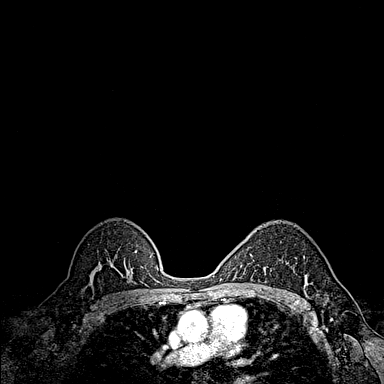
[im 144/144]
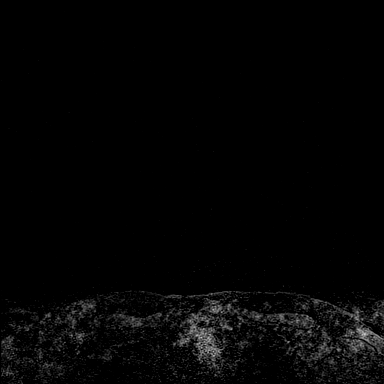

[Series 6: fl3d post-cm 20 · axial · 1.2mm · 0.89mm/px · z∈[-105,+66]mm · 5 of 144 slices shown (2 of 3)]
[im 1/144]
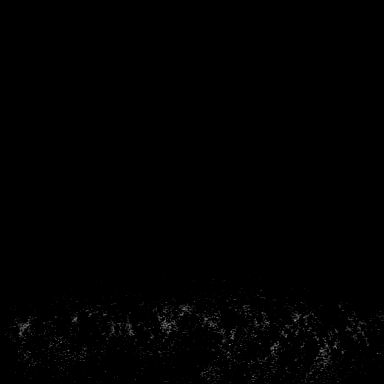
[im 36/144]
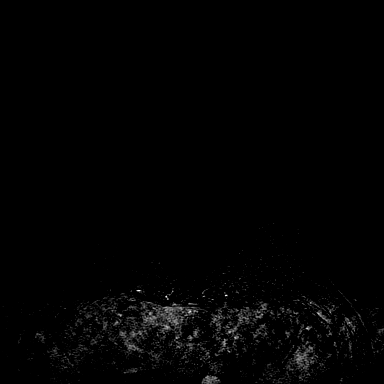
[im 72/144]
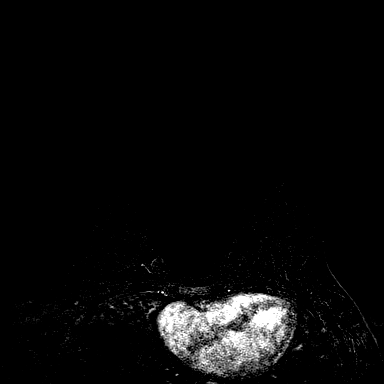
[im 108/144]
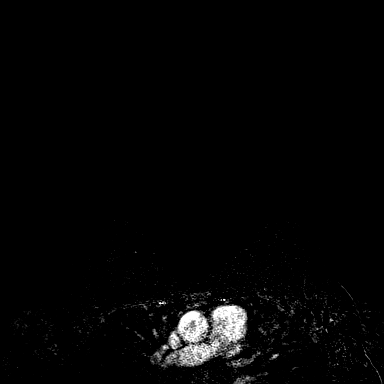
[im 144/144]
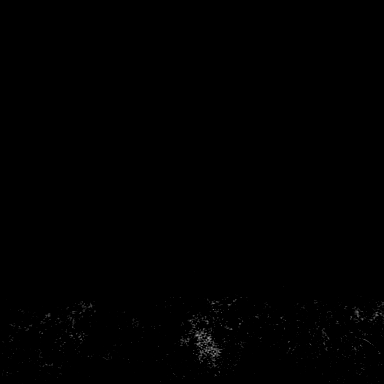

[Series 7: fl3d post-cm 20 · axial · 172.8mm · 0.89mm/px · 1 of 1 slices shown (3 of 3)]
[im 1/1]
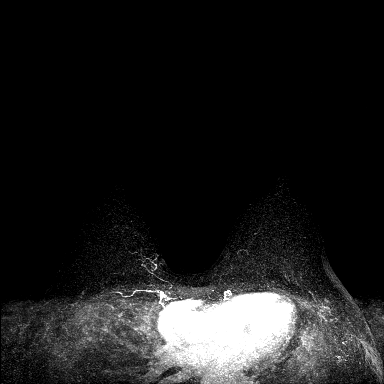

[Series 8: fl3d post-cm 3min · axial · 1.2mm · 0.89mm/px · z∈[-105,+66]mm · 6 of 144 slices shown]
[im 1/144]
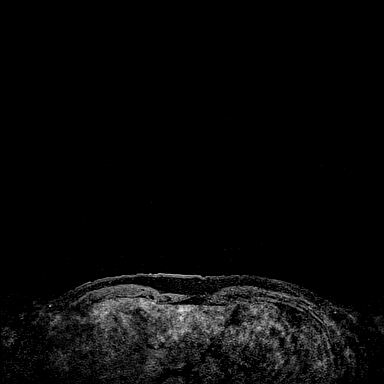
[im 29/144]
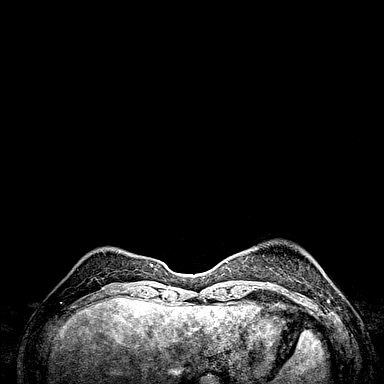
[im 58/144]
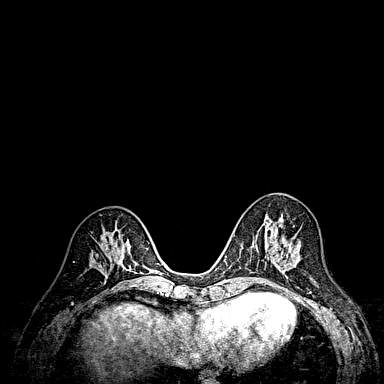
[im 86/144]
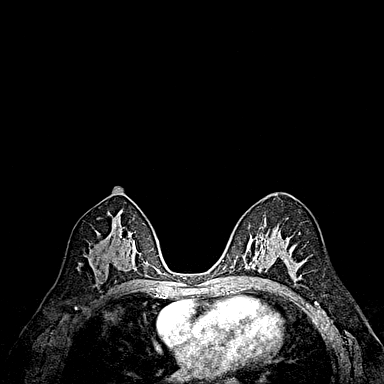
[im 115/144]
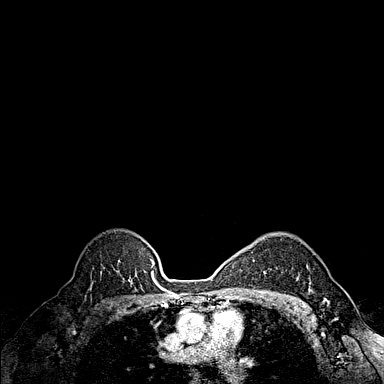
[im 144/144]
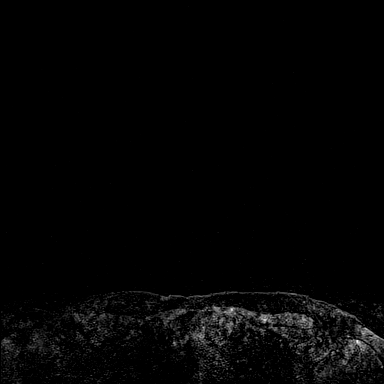

[Series 9: fl3d post-cm 3min_sub · axial · 1.2mm · 0.89mm/px · z∈[-105,+31]mm · 5 of 144 slices shown]
[im 1/144]
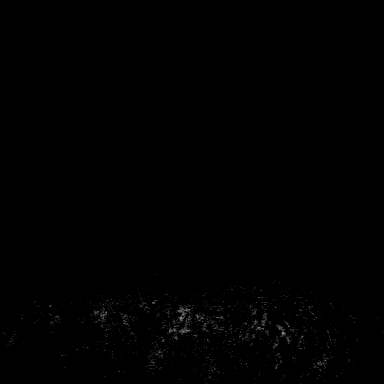
[im 29/144]
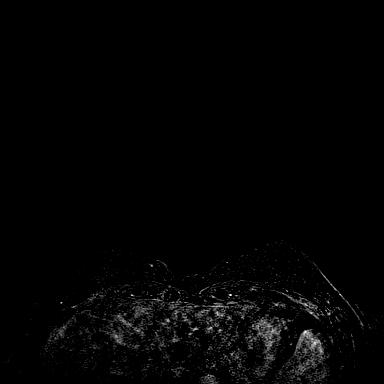
[im 58/144]
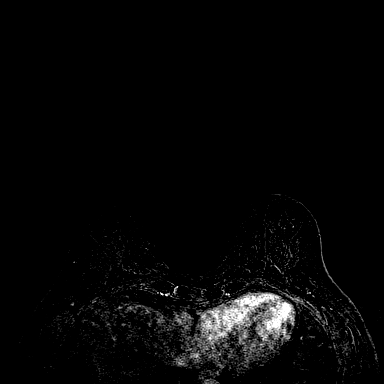
[im 86/144]
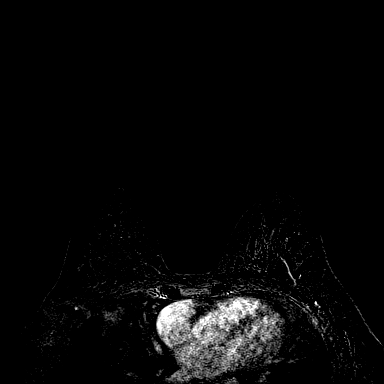
[im 115/144]
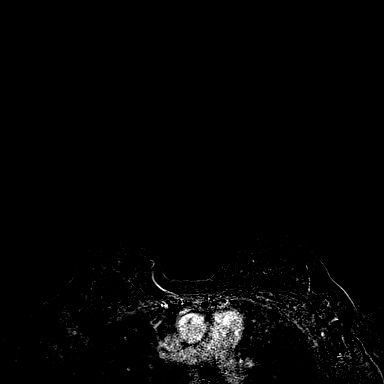

[33 of 48 positions shown; findings below may reference images not displayed]

Three-dimensional MR images were rendered by post-processing of the
original MR data on an independent workstation. The
three-dimensional MR images were interpreted, and findings are
reported in the following complete MRI report for this study. Three
dimensional images were evaluated at the independent DynaCad
workstation
FINDINGS: Breast composition: d. Extreme fibroglandular tissue.

Background parenchymal enhancement: Minimal

Right breast: No mass or abnormal enhancement.

Left breast: No mass or abnormal enhancement.

Lymph nodes: No abnormal appearing lymph nodes.

Ancillary findings:  None.
IMPRESSION: Complete resolution of the MRI findings associated with the
patient's known malignancy. The 3 enhancing foci in the right breast
described previously have resolved as well. The prominent node in
the right axilla is also normal in appearance today.

RECOMMENDATION:
Continued surgical and oncologic follow up.

BI-RADS CATEGORY  6: Known biopsy-proven malignancy.

## 2020-06-28 MED ORDER — GADOBUTROL 1 MMOL/ML IV SOLN
6.0000 mL | Freq: Once | INTRAVENOUS | Status: AC | PRN
  Administered 2020-06-28: 6 mL via INTRAVENOUS

## 2020-07-01 ENCOUNTER — Encounter: Payer: Self-pay | Admitting: Licensed Clinical Social Worker

## 2020-07-01 ENCOUNTER — Encounter: Payer: Self-pay | Admitting: *Deleted

## 2020-07-01 ENCOUNTER — Inpatient Hospital Stay: Payer: 59 | Admitting: Hematology and Oncology

## 2020-07-01 ENCOUNTER — Ambulatory Visit: Payer: Self-pay | Admitting: Surgery

## 2020-07-01 DIAGNOSIS — C50911 Malignant neoplasm of unspecified site of right female breast: Secondary | ICD-10-CM

## 2020-07-01 NOTE — Progress Notes (Signed)
[facility] CSW Progress Note  Clinical Education officer, museum contacted patient by phone to offer ongoing support. Patient expressed being very happy today as she received the good news that the MRI was clear after her chemo. She will be having surgery September 21, and has opted for a double mastectomy. No concerns at this time.   CSW will call after surgery for ongoing support.    Edwinna Areola Zniya Cottone , LCSW

## 2020-07-01 NOTE — H&P (Signed)
Virginia [name] Appointment: 07/01/2020 2:20 PM Location: [facility] Patient #: [id number] DOB: 08-Mar-1966 Married / Language: [name] / Race: White Female  History of Present Illness Virginia Moores A. Ahren Pettinger MD; 07/01/2020 2:32 PM) Patient words: Patient returns for follow-up of her right breast triple negative cancer. She has completed neoadjuvant chemotherapy and magnetic resonance imaging shows a complete response. She has seen plastic surgery has opted for bilateral nipple sparing mastectomy with reconstruction with right axillary sentinel lymph node mapping. She will also have her port removed as well.    ADDITIONAL INFORMATION: FLUORESCENCE IN-SITU HYBRIDIZATION Results: GROUP 5: HER2 **NEGATIVE** Equivocal form of amplification of the HER2 gene was detected in the IHC 2+ tissue sample received from this individual. HER2 FISH was performed by a technologist and cell imaging and analysis on the [employer]. RATIO OF HER2/CEN17 SIGNALS 1.14 AVERAGE HER2 COPY NUMBER PER CELL 2.00 The ratio of HER2/CEN 17 is within the range < 2.0 of HER2/CEN 17 and a copy number of HER2 signals per cell is <4.0. Arch Pathol Lab Med 1:1,2018 Virginia Sheller MD Pathologist, Electronic Signature ( Signed 02/08/2020) PROGNOSTIC INDICATORS Results: IMMUNOHISTOCHEMICAL AND MORPHOMETRIC ANALYSIS PERFORMED MANUALLY The tumor cells are EQUIVOCAL for Her2 (2+). Her2 by FISH will be performed and results reported separately. Estrogen Receptor: 0%, NEGATIVE Progesterone Receptor: 0%, NEGATIVE Proliferation Marker Ki67: 80% COMMENT: The negative hormone receptor study(ies) in this case has 20 internal positive control. 1 of 3 Amended copy Addendum FINAL for Virginia [name] ([id number]) ADDITIONAL INFORMATION:(continued) REFERENCE RANGE ESTROGEN RECEPTOR NEGATIVE 0% POSITIVE =>1% REFERENCE RANGE PROGESTERONE RECEPTOR NEGATIVE 0% POSITIVE =>1% All controls stained appropriately Virginia Sheller  MD Pathologist, Electronic Signature ( Signed 02/06/2020) FINAL DIAGNOSIS Diagnosis Breast, right, needle core biopsy, upper outer, 10 o'clock - INVASIVE MAMMARY CARCINOMA. Microscopic Comment The carcinoma appears nuclear grade 3. The greatest linear extent of tumor in any one core is 9 mm. E-cadherin will be reported separately. Ancillary studies will be reported separately. Results reported to [facility] on 02/05/2020. Intradepartmental consultation (Dr. Melina [name]). ADDENDUM: Immunohistochemistry         Assess breast cancer after neoadjuvant chemotherapy.  LABS: None  EXAM: BILATERAL BREAST MRI WITH AND WITHOUT CONTRAST  TECHNIQUE: Multiplanar, multisequence MR images of both breasts were obtained prior to and following the intravenous administration of 6 ml of Gadavist  Three-dimensional MR images were rendered by post-processing of the original MR data on an independent workstation. The three-dimensional MR images were interpreted, and findings are reported in the following complete MRI report for this study. Three dimensional images were evaluated at the independent DynaCad workstation  COMPARISON: MRI February 14, 2020  FINDINGS: Breast composition: d. Extreme fibroglandular tissue.  Background parenchymal enhancement: Minimal  Right breast: No mass or abnormal enhancement.  Left breast: No mass or abnormal enhancement.  Lymph nodes: No abnormal appearing lymph nodes.  Ancillary findings: None.  IMPRESSION: Complete resolution of the MRI findings associated with the patient's known malignancy. The 3 enhancing foci in the right breast described previously have resolved as well. The prominent node in the right axilla is also normal in appearance today.  RECOMMENDATION: Continued surgical and oncologic follow up.  BI-RADS CATEGORY 6: Known biopsy-proven malignancy.   Electronically Signed By: Virginia [name] M.D On:  06/28/2020 11:19.  The patient is a 54 year old female.   Allergies (Virginia [name], [name]; 07/01/2020 2:17 PM) No Known Allergies [02/12/2020]: No Known Drug Allergies [02/12/2020]: Allergies Reconciled  Medication History (Virginia [name], [place]; 07/01/2020 2:17  PM) Escitalopram Oxalate (20MG Tablet, Oral) Active. Medications Reconciled    Vitals (Virginia [name] [name]; 07/01/2020 2:18 PM) 07/01/2020 2:17 PM Weight: 139.25 lb Height: 61in Body Surface Area: 1.62 m Body Mass Index: 26.31 kg/m  Temp.: 97.29F  Pulse: 116 (Regular)  BP: 112/72(Sitting, Left Arm, Standard)        Physical Exam (Virginia Flessner A. Virginia Monday MD; 07/01/2020 2:33 PM)  General Note: alopecia  Chest and Lung Exam Chest and lung exam reveals -quiet, even and easy respiratory effort with no use of accessory muscles and on auscultation, normal breath sounds, no adventitious sounds and normal vocal resonance. Inspection Chest Wall - Normal. Back - normal.  Breast Breast - Left-Symmetric, Non Tender, No Biopsy scars, no Dimpling - Left, No Inflammation, No Lumpectomy scars, No Mastectomy scars, No Peau d' Orange. Breast - Right-Symmetric, Non Tender, No Biopsy scars, no Dimpling - Right, No Inflammation, No Lumpectomy scars, No Mastectomy scars, No Peau d' Orange. Breast Lump-No Palpable Breast Mass.  Cardiovascular Cardiovascular examination reveals -normal heart sounds, regular rate and rhythm with no murmurs and normal pedal pulses bilaterally.  Neurologic Neurologic evaluation reveals -alert and oriented x 3 with no impairment of recent or remote memory. Mental Status-Normal.  Lymphatic Axillary  General Axillary Region: Bilateral - Description - Normal. Tenderness - Non Tender.    Assessment & Plan (Virginia Virginia [name] A. Virginia Erdmann MD; 07/01/2020 2:34 PM)  BREAST CANCER, RIGHT (C50.911) Impression: T2 N0 MX grade 3 IDC triple negative Finished neoadjuvant chemotherapy with complete magnetic  resonance imaging response  opted for bilateral NSM right SLN mapping and port removal  Risk of bleeding, infection, nipple loss, dysfunction, skin necrosis, flap necrosis, and the need for other operative procedures. Risk of lymphedema discussed with sentinel lymph node mapping procedures well.. Discussed treatment options for breast cancer to include breast conservation vs mastectomy with reconstruction. Pt has decided on mastectomy. Risk include bleeding, infection, flap necrosis, pain, numbness, recurrence, hematoma, other surgery needs. Pt understands and agrees to proceed. Risk of sentinel lymph node mapping include bleeding, infection, lymphedema, shoulder pain. stiffness, dye allergy. cosmetic deformity , blood clots, death, need for more surgery. Pt agrees to proceed.  TOTA TIME 45 minutes reviewing mammogram, biopsy, discussed the pathophysiology, treatment options, complications of treatment, physical exam, and EHR Pt requires port placement for chemotherapy. Risk include bleeding, infection, pneumothorax, hemothorax, mediastinal injury, nerve injury , blood vessel injury, strke, blood clots, death, migration. embolization and need for additional procedures. Pt agrees to proceed.  Current Plans You are being scheduled for surgery- Our schedulers will call you.  You should hear from our office's scheduling department within 5 working days about the location, date, and time of surgery. We try to make accommodations for patient's preferences in scheduling surgery, but sometimes the OR schedule or the surgeon's schedule prevents [place] from making those accommodations.  If you have not heard from our office [phone]) in 5 working days, call the office and ask for your surgeon's nurse.  If you have other questions about your diagnosis, plan, or surgery, call the office and ask for your surgeon's nurse.  Pt Education - [facility] Mastectomy [facility]CI Pt Education - [facility] Breast Pains Education Pt  Education - ABC (After Breast Cancer) Class Info: discussed with patient and provided information.

## 2020-07-05 ENCOUNTER — Other Ambulatory Visit: Payer: 59

## 2020-07-05 ENCOUNTER — Ambulatory Visit: Payer: 59 | Admitting: Hematology and Oncology

## 2020-07-05 ENCOUNTER — Ambulatory Visit: Payer: 59

## 2020-07-08 ENCOUNTER — Encounter: Payer: Self-pay | Admitting: *Deleted

## 2020-07-09 ENCOUNTER — Telehealth: Payer: Self-pay | Admitting: Hematology and Oncology

## 2020-07-09 NOTE — Telephone Encounter (Signed)
Scheduled apt per 8/23 sch msg - pt is aware of apt date and time

## 2020-07-12 ENCOUNTER — Other Ambulatory Visit: Payer: 59

## 2020-07-12 ENCOUNTER — Ambulatory Visit: Payer: 59

## 2020-07-12 ENCOUNTER — Ambulatory Visit: Payer: 59 | Admitting: Adult Health

## 2020-07-23 ENCOUNTER — Encounter: Payer: Self-pay | Admitting: *Deleted

## 2020-07-24 ENCOUNTER — Encounter: Payer: Self-pay | Admitting: *Deleted

## 2020-07-25 DIAGNOSIS — C50411 Malignant neoplasm of upper-outer quadrant of right female breast: Secondary | ICD-10-CM | POA: Diagnosis not present

## 2020-07-25 DIAGNOSIS — Z171 Estrogen receptor negative status [ER-]: Secondary | ICD-10-CM | POA: Diagnosis not present

## 2020-07-25 MED FILL — METHOCARBAMOL 500 MG TABS: 500 | 10 days supply | Qty: 30 | Fill #0

## 2020-07-25 MED FILL — oxyCODONE HCL 5 MG TABS: 5 | 5 days supply | Qty: 15 | Fill #0

## 2020-07-25 MED FILL — SULFAMETHOXAZOLE-TMP DS TAB: 800-160 | 6 days supply | Qty: 12 | Fill #0

## 2020-07-25 NOTE — H&P (Signed)
Subjective:     Patient ID: Virginia [name] is a 54 y.o. female.  HPI  Here for follow up discussion breast reconstruction prior to planned bilateral masetectomies. Presented with palpable mass right breast. MMG/US showed 2 cm mass 10 o clock, no axillary lymphadenopathy. 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  MRI demonstrated 2 cm mass at 10 o clock, three indeterminate 4 mm, 5 mm, and 5 mm masses, single prominent axillary node. Second look axillary [place] without lymphadenopathy.  Staging scans negative for distant disease, 5 mm axillary node noted.  Completed neoadjuvant chemotherapy. Final MRI with complete imaging resolution including node.  Genetics negative  Current 34 B happy with this. Wt stable  PMH significant for CP, repair done at [facility].  Works for [employer], works on point of care machines. Lives with husband and adult son.  Review of Systems     Objective:   Physical Exam  Constitutional: She is oriented to person, place, and time.  Cardiovascular: Normal rate.  Pulmonary/Chest: Effort normal.  Abdominal: Soft.  Small volume soft tissue for reconstruction  Neurological: She is alert and oriented to person, place, and time.  Skin:  Fitzpatrick 2    Pseudoptosis bilateral SN to nipple R 22 L 22 cm BW R 18 (CW 13) L 19 cm (CW 15) Nipple to IMF R 7 L 7 cm  Right chest port Nodular indurated area right lateral breast     Assessment:     Right breast ca UOQ ER- Neoadjuvant chemotherapy    Plan:     Plan bilateral NSM with immediate tissue expander acellular dermis reconstruction.  Revieweddrains, OR length, hospital stay and recovery, limitations. Discussed process of expansion and implant based risks including rupture, MRI surveillance for silicone implants, infection requiring surgery or removal, contracture. Reviewed risks mastectomy flap necrosis requiring additional surgery.Reviewedreconstructionwill be asensate and  not stimulate.  Discussed use of acellular dermis in reconstruction, cadaveric source, incorporation over several weeks, risk that if has seroma or infection can act as additional nidus for infection if not incorporated.  Discussed prepectoral vs sub pectoral reconstruction. Discussed with patient and benefit of this is no animation deformity, may be less pain. Risk may be more visible rippling over upper poles, greater need of ADM. Reviewed pre pectoral would require larger amount acellular dermis, more drains. Discussed any type reconstruction also risks long term displacement implant and visible rippling. If prepectoral counseled I would recommend she be comfortable with silicone implants as more options that have less rippling. She agrees to prepectoral placement.  Reviewed reconstruction will be asensate and not stimulate. Reviewed additional risks including but not limited to risks mastectomy flap necrosis requiring additional surgery, seroma, hematoma, asymmetry, need to additional procedures, fat necrosis, DVT/PE, damage to adjacent structures, cardiopulmonary complications.  Drain teaching completed. Rx for [place] to [place] given.  Rx for Bactrim oxycodone Robaxin given. Has 800 mg ibuprofen at home.

## 2020-07-31 ENCOUNTER — Encounter (HOSPITAL_BASED_OUTPATIENT_CLINIC_OR_DEPARTMENT_OTHER): Payer: Self-pay | Admitting: Plastic Surgery

## 2020-07-31 ENCOUNTER — Other Ambulatory Visit: Payer: Self-pay

## 2020-08-02 ENCOUNTER — Other Ambulatory Visit (HOSPITAL_COMMUNITY)
Admission: RE | Admit: 2020-08-02 | Discharge: 2020-08-02 | Disposition: A | Discharge status: Home / Self Care | Payer: 59 | Source: Ambulatory Visit | Attending: Plastic Surgery | Admitting: Plastic Surgery

## 2020-08-02 ENCOUNTER — Encounter (HOSPITAL_BASED_OUTPATIENT_CLINIC_OR_DEPARTMENT_OTHER)
Admission: RE | Admit: 2020-08-02 | Discharge: 2020-08-02 | Disposition: A | Discharge status: Home / Self Care | Payer: 59 | Source: Ambulatory Visit | Attending: Surgery | Admitting: Surgery

## 2020-08-02 DIAGNOSIS — Z01812 Encounter for preprocedural laboratory examination: Secondary | ICD-10-CM | POA: Insufficient documentation

## 2020-08-02 DIAGNOSIS — Z20822 Contact with and (suspected) exposure to covid-19: Secondary | ICD-10-CM | POA: Insufficient documentation

## 2020-08-02 LAB — COMPREHENSIVE METABOLIC PANEL
ALT: 40 U/L (ref 0–44)
AST: 30 U/L (ref 15–41)
Albumin: 4 g/dL (ref 3.5–5.0)
Alkaline Phosphatase: 64 U/L (ref 38–126)
Anion gap: 9 (ref 5–15)
BUN: 16 mg/dL (ref 6–20)
CO2: 24 mmol/L (ref 22–32)
Calcium: 9.3 mg/dL (ref 8.9–10.3)
Chloride: 105 mmol/L (ref 98–111)
Creatinine, Ser: 0.72 mg/dL (ref 0.44–1.00)
GFR calc Af Amer: >60 mL/min (ref >60)
GFR calc non Af Amer: >60 mL/min (ref >60)
Glucose, Bld: 73 mg/dL (ref 70–99)
Potassium: 4.5 mmol/L (ref 3.5–5.1)
Sodium: 138 mmol/L (ref 135–145)
Total Bilirubin: 0.6 mg/dL (ref 0.3–1.2)
Total Protein: 6.5 g/dL (ref 6.5–8.1)

## 2020-08-02 LAB — CBC WITH DIFFERENTIAL/PLATELET
Abs Immature Granulocytes: 0.02 10*3/uL (ref 0.00–0.07)
Basophils Absolute: 0 10*3/uL (ref 0.0–0.1)
Basophils Relative: 1 %
Eosinophils Absolute: 0.2 10*3/uL (ref 0.0–0.5)
Eosinophils Relative: 3 %
HCT: 33.9 % — ABNORMAL LOW (ref 36.0–46.0)
Hemoglobin: 11.2 g/dL — ABNORMAL LOW (ref 12.0–15.0)
Immature Granulocytes: 0 %
Lymphocytes Relative: 23 %
Lymphs Abs: 1.1 10*3/uL (ref 0.7–4.0)
MCH: 31.4 pg (ref 26.0–34.0)
MCHC: 33 g/dL (ref 30.0–36.0)
MCV: 95 fL (ref 80.0–100.0)
Monocytes Absolute: 0.7 10*3/uL (ref 0.1–1.0)
Monocytes Relative: 15 %
Neutro Abs: 2.7 10*3/uL (ref 1.7–7.7)
Neutrophils Relative %: 58 %
Platelets: 183 10*3/uL (ref 150–400)
RBC: 3.57 MIL/uL — ABNORMAL LOW (ref 3.87–5.11)
RDW: 12.2 % (ref 11.5–15.5)
WBC: 4.7 10*3/uL (ref 4.0–10.5)
nRBC: 0 % (ref 0.0–0.2)

## 2020-08-02 NOTE — Progress Notes (Signed)

## 2020-08-03 LAB — SARS CORONAVIRUS 2 (TAT 6-24 HRS): SARS Coronavirus 2: NEGATIVE

## 2020-08-05 NOTE — Anesthesia Preprocedure Evaluation (Addendum)
Anesthesia Evaluation  Patient identified by MRN, date of birth, ID band Patient awake    Reviewed: Allergy & Precautions, NPO status , Patient's Chart, lab work & pertinent test results  Airway Mallampati: II  TM Distance: >3 FB Neck ROM: Full    Dental no notable dental hx. (+) Teeth Intact, Dental Advisory Given   Pulmonary neg pulmonary ROS,    Pulmonary exam normal breath sounds clear to auscultation       Cardiovascular negative cardio ROS Normal cardiovascular exam Rhythm:Regular Rate:Normal  Echo4/21 Left Ventricle: Left ventricular ejection fraction, by estimation, is 60  to 65%. The left ventricle has normal function. The left ventricle has no  regional wall motion abnormalities.   Neuro/Psych PSYCHIATRIC DISORDERS Anxiety negative neurological ROS  negative psych ROS   GI/Hepatic negative GI ROS, Neg liver ROS,   Endo/Other  negative endocrine ROS  Renal/GU negative Renal ROS  negative genitourinary   Musculoskeletal negative musculoskeletal ROS (+) Arthritis , Osteoarthritis,    Abdominal   Peds negative pediatric ROS (+)  Hematology negative hematology ROS (+) Blood dyscrasia, anemia ,   Anesthesia Other Findings   Reproductive/Obstetrics negative OB ROS                            Anesthesia Physical  Anesthesia Plan  ASA: II  Anesthesia Plan: General   Post-op Pain Management: GA combined w/ Regional for post-op pain   Induction: Intravenous  PONV Risk Score and Plan: 3 and Ondansetron, Dexamethasone, Treatment may vary due to age or medical condition and Midazolam  Airway Management Planned: LMA and Oral ETT  Additional Equipment:   Intra-op Plan:   Post-operative Plan: Extubation in OR  Informed Consent: I have reviewed the patients History and Physical, chart, labs and discussed the procedure including the risks, benefits and alternatives for the proposed  anesthesia with the patient or authorized representative who has indicated his/her understanding and acceptance.     Dental advisory given  Plan Discussed with: CRNA, Surgeon and Anesthesiologist  Anesthesia Plan Comments:         Anesthesia Quick Evaluation

## 2020-08-06 ENCOUNTER — Ambulatory Visit (HOSPITAL_BASED_OUTPATIENT_CLINIC_OR_DEPARTMENT_OTHER)
Admission: RE | Admit: 2020-08-06 | Discharge: 2020-08-07 | Disposition: A | Discharge status: Home / Self Care | Payer: 59 | Attending: Plastic Surgery | Admitting: Plastic Surgery

## 2020-08-06 ENCOUNTER — Ambulatory Visit (HOSPITAL_BASED_OUTPATIENT_CLINIC_OR_DEPARTMENT_OTHER): Payer: 59 | Admitting: Certified Registered"

## 2020-08-06 ENCOUNTER — Encounter (HOSPITAL_COMMUNITY)
Admission: RE | Admit: 2020-08-06 | Discharge: 2020-08-06 | Disposition: A | Discharge status: Home / Self Care | Payer: 59 | Source: Ambulatory Visit | Attending: Surgery | Admitting: Surgery

## 2020-08-06 ENCOUNTER — Encounter (HOSPITAL_BASED_OUTPATIENT_CLINIC_OR_DEPARTMENT_OTHER): Payer: Self-pay | Admitting: Plastic Surgery

## 2020-08-06 ENCOUNTER — Encounter (HOSPITAL_BASED_OUTPATIENT_CLINIC_OR_DEPARTMENT_OTHER)
Admission: RE | Disposition: A | Discharge status: Home / Self Care | Payer: Self-pay | Source: Home / Self Care | Attending: Plastic Surgery

## 2020-08-06 ENCOUNTER — Other Ambulatory Visit: Payer: Self-pay

## 2020-08-06 DIAGNOSIS — C50911 Malignant neoplasm of unspecified site of right female breast: Secondary | ICD-10-CM | POA: Diagnosis present

## 2020-08-06 DIAGNOSIS — Z9221 Personal history of antineoplastic chemotherapy: Secondary | ICD-10-CM | POA: Insufficient documentation

## 2020-08-06 DIAGNOSIS — Z4001 Encounter for prophylactic removal of breast: Secondary | ICD-10-CM | POA: Diagnosis not present

## 2020-08-06 DIAGNOSIS — M1990 Unspecified osteoarthritis, unspecified site: Secondary | ICD-10-CM | POA: Insufficient documentation

## 2020-08-06 DIAGNOSIS — Z79899 Other long term (current) drug therapy: Secondary | ICD-10-CM | POA: Insufficient documentation

## 2020-08-06 DIAGNOSIS — F419 Anxiety disorder, unspecified: Secondary | ICD-10-CM | POA: Diagnosis not present

## 2020-08-06 DIAGNOSIS — G8918 Other acute postprocedural pain: Secondary | ICD-10-CM | POA: Diagnosis not present

## 2020-08-06 DIAGNOSIS — C50411 Malignant neoplasm of upper-outer quadrant of right female breast: Secondary | ICD-10-CM | POA: Diagnosis not present

## 2020-08-06 DIAGNOSIS — Z171 Estrogen receptor negative status [ER-]: Secondary | ICD-10-CM | POA: Insufficient documentation

## 2020-08-06 DIAGNOSIS — D649 Anemia, unspecified: Secondary | ICD-10-CM | POA: Diagnosis not present

## 2020-08-06 HISTORY — PX: PORT-A-CATH REMOVAL: SHX5289

## 2020-08-06 HISTORY — PX: NIPPLE SPARING MASTECTOMY WITH SENTINEL LYMPH NODE BIOPSY: SHX6826

## 2020-08-06 HISTORY — PX: BREAST RECONSTRUCTION WITH PLACEMENT OF TISSUE EXPANDER AND ALLODERM: SHX6805

## 2020-08-06 SURGERY — BREAST RECONSTRUCTION WITH PLACEMENT OF TISSUE EXPANDER AND ALLODERM
Anesthesia: General | Site: Chest | Laterality: Right

## 2020-08-06 MED ORDER — BUPIVACAINE HCL (PF) 0.5 % IJ SOLN
INTRAMUSCULAR | Status: DC | PRN
  Administered 2020-08-06: 15 mL

## 2020-08-06 MED ORDER — MIDAZOLAM HCL 2 MG/2ML IJ SOLN
2.0000 mg | Freq: Once | INTRAMUSCULAR | Status: AC
  Administered 2020-08-06: 2 mg via INTRAVENOUS

## 2020-08-06 MED ORDER — MIDAZOLAM HCL 2 MG/2ML IJ SOLN
INTRAMUSCULAR | Status: AC
  Filled 2020-08-06: qty 2

## 2020-08-06 MED ORDER — SUGAMMADEX SODIUM 200 MG/2ML IV SOLN
INTRAVENOUS | Status: DC | PRN
  Administered 2020-08-06: 200 mg via INTRAVENOUS

## 2020-08-06 MED ORDER — ACETAMINOPHEN 160 MG/5ML PO SOLN
325.0000 mg | ORAL | Status: DC | PRN
  Administered 2020-08-07: 650 mg via ORAL
  Filled 2020-08-06: qty 20.3

## 2020-08-06 MED ORDER — OXYCODONE HCL 5 MG PO TABS: 5.0000 mg | ORAL_TABLET | Freq: Once | ORAL | Status: DC | PRN

## 2020-08-06 MED ORDER — CEFAZOLIN SODIUM-DEXTROSE 2-4 GM/100ML-% IV SOLN
2.0000 g | INTRAVENOUS | Status: AC
  Administered 2020-08-06: 2 g via INTRAVENOUS

## 2020-08-06 MED ORDER — MENTHOL 3 MG MT LOZG
1.0000 | LOZENGE | OROMUCOSAL | Status: DC | PRN
  Filled 2020-08-06: qty 9

## 2020-08-06 MED ORDER — ROCURONIUM BROMIDE 100 MG/10ML IV SOLN
INTRAVENOUS | Status: DC | PRN
  Administered 2020-08-06: 80 mg via INTRAVENOUS

## 2020-08-06 MED ORDER — PROPOFOL 10 MG/ML IV BOLUS
INTRAVENOUS | Status: DC | PRN
  Administered 2020-08-06: 150 mg via INTRAVENOUS

## 2020-08-06 MED ORDER — LACTATED RINGERS IV SOLN: INTRAVENOUS | Status: DC

## 2020-08-06 MED ORDER — LIDOCAINE 2% (20 MG/ML) 5 ML SYRINGE
INTRAMUSCULAR | Status: AC
  Filled 2020-08-06: qty 5

## 2020-08-06 MED ORDER — PROPOFOL 500 MG/50ML IV EMUL
INTRAVENOUS | Status: AC
  Filled 2020-08-06: qty 100

## 2020-08-06 MED ORDER — ROPIVACAINE HCL 7.5 MG/ML IJ SOLN
INTRAMUSCULAR | Status: DC | PRN
  Administered 2020-08-06: 25 mL via PERINEURAL

## 2020-08-06 MED ORDER — OXYCODONE HCL 5 MG PO TABS: 5.0000 mg | ORAL_TABLET | ORAL | Status: DC | PRN

## 2020-08-06 MED ORDER — FENTANYL CITRATE (PF) 100 MCG/2ML IJ SOLN
25.0000 ug | INTRAMUSCULAR | Status: DC | PRN
  Administered 2020-08-06 (×2): 25 ug via INTRAVENOUS

## 2020-08-06 MED ORDER — SODIUM CHLORIDE 0.9 % IR SOLN
Status: DC | PRN
  Administered 2020-08-06: 1000 mL

## 2020-08-06 MED ORDER — ONDANSETRON HCL 4 MG/2ML IJ SOLN: 4.0000 mg | Freq: Four times a day (QID) | INTRAMUSCULAR | Status: DC | PRN

## 2020-08-06 MED ORDER — BUPIVACAINE HCL (PF) 0.5 % IJ SOLN
INTRAMUSCULAR | Status: AC
  Filled 2020-08-06: qty 30

## 2020-08-06 MED ORDER — ENOXAPARIN SODIUM 40 MG/0.4ML ~~LOC~~ SOLN
40.0000 mg | SUBCUTANEOUS | Status: DC
  Administered 2020-08-07: 40 mg via SUBCUTANEOUS
  Filled 2020-08-06: qty 0.4

## 2020-08-06 MED ORDER — FENTANYL CITRATE (PF) 100 MCG/2ML IJ SOLN
INTRAMUSCULAR | Status: AC
  Filled 2020-08-06: qty 2

## 2020-08-06 MED ORDER — SUGAMMADEX SODIUM 500 MG/5ML IV SOLN
INTRAVENOUS | Status: AC
  Filled 2020-08-06: qty 5

## 2020-08-06 MED ORDER — KCL IN DEXTROSE-NACL 20-5-0.45 MEQ/L-%-% IV SOLN
INTRAVENOUS | Status: DC
  Filled 2020-08-06: qty 1000

## 2020-08-06 MED ORDER — CHLORHEXIDINE GLUCONATE CLOTH 2 % EX PADS: 6.0000 | MEDICATED_PAD | Freq: Once | CUTANEOUS | Status: DC

## 2020-08-06 MED ORDER — METHYLENE BLUE 0.5 % INJ SOLN
INTRAVENOUS | Status: AC
  Filled 2020-08-06: qty 10

## 2020-08-06 MED ORDER — SODIUM CHLORIDE (PF) 0.9 % IJ SOLN
INTRAMUSCULAR | Status: AC
  Filled 2020-08-06: qty 10

## 2020-08-06 MED ORDER — TECHNETIUM TC 99M SULFUR COLLOID FILTERED
1.0000 | Freq: Once | INTRAVENOUS | Status: AC | PRN
  Administered 2020-08-06: 1 via INTRADERMAL

## 2020-08-06 MED ORDER — METHOCARBAMOL 500 MG PO TABS
500.0000 mg | ORAL_TABLET | Freq: Four times a day (QID) | ORAL | Status: DC | PRN
  Administered 2020-08-06 – 2020-08-07 (×2): 500 mg via ORAL
  Filled 2020-08-06 (×2): qty 1

## 2020-08-06 MED ORDER — ACETAMINOPHEN 325 MG PO TABS: 325.0000 mg | ORAL_TABLET | ORAL | Status: DC | PRN

## 2020-08-06 MED ORDER — BUPIVACAINE HCL (PF) 0.25 % IJ SOLN
INTRAMUSCULAR | Status: AC
  Filled 2020-08-06: qty 30

## 2020-08-06 MED ORDER — MEPERIDINE HCL 25 MG/ML IJ SOLN: 6.2500 mg | INTRAMUSCULAR | Status: DC | PRN

## 2020-08-06 MED ORDER — FENTANYL CITRATE (PF) 100 MCG/2ML IJ SOLN
100.0000 ug | Freq: Once | INTRAMUSCULAR | Status: AC
  Administered 2020-08-06: 100 ug via INTRAVENOUS

## 2020-08-06 MED ORDER — OXYCODONE HCL 5 MG/5ML PO SOLN: 5.0000 mg | Freq: Once | ORAL | Status: DC | PRN

## 2020-08-06 MED ORDER — PHENYLEPHRINE 40 MCG/ML (10ML) SYRINGE FOR IV PUSH (FOR BLOOD PRESSURE SUPPORT)
PREFILLED_SYRINGE | INTRAVENOUS | Status: AC
  Filled 2020-08-06: qty 10

## 2020-08-06 MED ORDER — SODIUM CHLORIDE (PF) 0.9 % IJ SOLN
INTRAVENOUS | Status: DC | PRN
  Administered 2020-08-06: 5 mL via INTRADERMAL

## 2020-08-06 MED ORDER — ONDANSETRON HCL 4 MG/2ML IJ SOLN
INTRAMUSCULAR | Status: DC | PRN
  Administered 2020-08-06: 4 mg via INTRAVENOUS

## 2020-08-06 MED ORDER — ESCITALOPRAM OXALATE 20 MG PO TABS
20.0000 mg | ORAL_TABLET | Freq: Every day | ORAL | Status: DC
  Filled 2020-08-06: qty 1

## 2020-08-06 MED ORDER — ACETAMINOPHEN 500 MG PO TABS
1000.0000 mg | ORAL_TABLET | ORAL | Status: AC
  Administered 2020-08-06: 1000 mg via ORAL

## 2020-08-06 MED ORDER — HYDROMORPHONE HCL 1 MG/ML IJ SOLN: 0.5000 mg | INTRAMUSCULAR | Status: DC | PRN

## 2020-08-06 MED ORDER — ONDANSETRON 4 MG PO TBDP: 4.0000 mg | ORAL_TABLET | Freq: Four times a day (QID) | ORAL | Status: DC | PRN

## 2020-08-06 MED ORDER — ACETAMINOPHEN 500 MG PO TABS
ORAL_TABLET | ORAL | Status: AC
  Filled 2020-08-06: qty 2

## 2020-08-06 MED ORDER — LIDOCAINE HCL (CARDIAC) PF 100 MG/5ML IV SOSY
PREFILLED_SYRINGE | INTRAVENOUS | Status: DC | PRN
  Administered 2020-08-06: 30 mg via INTRAVENOUS

## 2020-08-06 MED ORDER — PHENYLEPHRINE 40 MCG/ML (10ML) SYRINGE FOR IV PUSH (FOR BLOOD PRESSURE SUPPORT)
PREFILLED_SYRINGE | INTRAVENOUS | Status: AC
  Filled 2020-08-06: qty 30

## 2020-08-06 MED ORDER — SODIUM CHLORIDE 0.9 % IR SOLN
Freq: Once | Status: DC
  Filled 2020-08-06: qty 1000

## 2020-08-06 MED ORDER — KETOROLAC TROMETHAMINE 30 MG/ML IJ SOLN
30.0000 mg | Freq: Three times a day (TID) | INTRAMUSCULAR | Status: AC
  Administered 2020-08-06 – 2020-08-07 (×3): 30 mg via INTRAVENOUS
  Filled 2020-08-06 (×3): qty 1

## 2020-08-06 MED ORDER — 0.9 % SODIUM CHLORIDE (POUR BTL) OPTIME
TOPICAL | Status: DC | PRN
  Administered 2020-08-06: 1000 mL

## 2020-08-06 MED ORDER — ONDANSETRON HCL 4 MG/2ML IJ SOLN: 4.0000 mg | Freq: Once | INTRAMUSCULAR | Status: DC | PRN

## 2020-08-06 MED ORDER — FENTANYL CITRATE (PF) 100 MCG/2ML IJ SOLN
INTRAMUSCULAR | Status: DC | PRN
Start: 2020-08-06 — End: 2020-08-06
  Administered 2020-08-06 (×2): 50 ug via INTRAVENOUS

## 2020-08-06 MED ORDER — POVIDONE-IODINE 10 % EX SOLN
CUTANEOUS | Status: DC | PRN
  Administered 2020-08-06: 1 via TOPICAL

## 2020-08-06 MED ORDER — CEFAZOLIN SODIUM-DEXTROSE 2-4 GM/100ML-% IV SOLN
INTRAVENOUS | Status: AC
  Filled 2020-08-06: qty 100

## 2020-08-06 MED ORDER — ROCURONIUM BROMIDE 10 MG/ML (PF) SYRINGE
PREFILLED_SYRINGE | INTRAVENOUS | Status: AC
  Filled 2020-08-06: qty 10

## 2020-08-06 MED ORDER — PROPOFOL 500 MG/50ML IV EMUL
INTRAVENOUS | Status: DC | PRN
  Administered 2020-08-06: 25 ug/kg/min via INTRAVENOUS

## 2020-08-06 MED ORDER — DEXAMETHASONE SODIUM PHOSPHATE 4 MG/ML IJ SOLN
INTRAMUSCULAR | Status: DC | PRN
  Administered 2020-08-06: 10 mg via INTRAVENOUS

## 2020-08-06 MED ORDER — DEXAMETHASONE SODIUM PHOSPHATE 10 MG/ML IJ SOLN
INTRAMUSCULAR | Status: AC
  Filled 2020-08-06: qty 1

## 2020-08-06 MED ORDER — SODIUM CHLORIDE 0.9 % IV BOLUS
Freq: Once | INTRAVENOUS | Status: DC
  Filled 2020-08-06: qty 1000

## 2020-08-06 SURGICAL SUPPLY — 101 items
ADH SKN CLS APL DERMABOND .7 (GAUZE/BANDAGES/DRESSINGS) ×4
ALLOGRAFT PERF 16X20 1.6+/-0.4 (Tissue) ×2 IMPLANT
APL PRP STRL LF DISP 70% ISPRP (MISCELLANEOUS) ×4
APL SKNCLS STERI-STRIP NONHPOA (GAUZE/BANDAGES/DRESSINGS)
APPLIER CLIP 11 MED OPEN (CLIP)
APPLIER CLIP 9.375 MED OPEN (MISCELLANEOUS) ×3
APR CLP MED 11 20 MLT OPN (CLIP)
APR CLP MED 9.3 20 MLT OPN (MISCELLANEOUS) ×2
BAG DECANTER FOR FLEXI CONT (MISCELLANEOUS) ×3 IMPLANT
BENZOIN TINCTURE PRP APPL 2/3 (GAUZE/BANDAGES/DRESSINGS) IMPLANT
BINDER BREAST LRG (GAUZE/BANDAGES/DRESSINGS) ×1 IMPLANT
BINDER BREAST MEDIUM (GAUZE/BANDAGES/DRESSINGS) IMPLANT
BINDER BREAST XLRG (GAUZE/BANDAGES/DRESSINGS) IMPLANT
BINDER BREAST XXLRG (GAUZE/BANDAGES/DRESSINGS) IMPLANT
BLADE HEX COATED 2.75 (ELECTRODE) ×2 IMPLANT
BLADE SURG 10 STRL SS (BLADE) ×4 IMPLANT
BLADE SURG 15 STRL LF DISP TIS (BLADE) ×2 IMPLANT
BLADE SURG 15 STRL SS (BLADE) ×3
BNDG GAUZE ELAST 4 BULKY (GAUZE/BANDAGES/DRESSINGS) ×2 IMPLANT
CANISTER SUCT 1200ML W/VALVE (MISCELLANEOUS) ×3 IMPLANT
CHLORAPREP W/TINT 26 (MISCELLANEOUS) ×4 IMPLANT
CLIP APPLIE 11 MED OPEN (CLIP) IMPLANT
CLIP APPLIE 9.375 MED OPEN (MISCELLANEOUS) ×2 IMPLANT
COUNTER NEEDLE 1200 MAGNETIC (NEEDLE) IMPLANT
COVER BACK TABLE 60X90IN (DRAPES) ×3 IMPLANT
COVER MAYO STAND STRL (DRAPES) ×6 IMPLANT
COVER PROBE W GEL 5X96 (DRAPES) ×3 IMPLANT
COVER WAND RF STERILE (DRAPES) IMPLANT
DECANTER SPIKE VIAL GLASS SM (MISCELLANEOUS) ×2 IMPLANT
DERMABOND ADVANCED (GAUZE/BANDAGES/DRESSINGS) ×2
DERMABOND ADVANCED .7 DNX12 (GAUZE/BANDAGES/DRESSINGS) ×4 IMPLANT
DRAIN CHANNEL 15F RND FF W/TCR (WOUND CARE) ×2 IMPLANT
DRAIN CHANNEL 19F RND (DRAIN) ×4 IMPLANT
DRAPE INCISE IOBAN 66X45 STRL (DRAPES) ×3 IMPLANT
DRAPE LAPAROSCOPIC ABDOMINAL (DRAPES) ×2 IMPLANT
DRAPE LAPAROTOMY 100X72 PEDS (DRAPES) ×2 IMPLANT
DRAPE TOP ARMCOVERS (MISCELLANEOUS) ×3 IMPLANT
DRAPE U-SHAPE 76X120 STRL (DRAPES) ×4 IMPLANT
DRAPE UTILITY XL STRL (DRAPES) ×4 IMPLANT
DRSG PAD ABDOMINAL 8X10 ST (GAUZE/BANDAGES/DRESSINGS) ×6 IMPLANT
DRSG TEGADERM 4X10 (GAUZE/BANDAGES/DRESSINGS) ×7 IMPLANT
ELECT BLADE 4.0 EZ CLEAN MEGAD (MISCELLANEOUS) ×3
ELECT COATED BLADE 2.86 ST (ELECTRODE) ×3 IMPLANT
ELECT REM PT RETURN 9FT ADLT (ELECTROSURGICAL) ×3
ELECTRODE BLDE 4.0 EZ CLN MEGD (MISCELLANEOUS) ×2 IMPLANT
ELECTRODE REM PT RTRN 9FT ADLT (ELECTROSURGICAL) ×2 IMPLANT
EVACUATOR SILICONE 100CC (DRAIN) ×6 IMPLANT
EXPANDER TISSUE FV FOURTE 400 (Prosthesis & Implant Plastic) IMPLANT
GAUZE SPONGE 4X4 12PLY STRL LF (GAUZE/BANDAGES/DRESSINGS) IMPLANT
GLOVE BIO SURGEON STRL SZ 6 (GLOVE) ×8 IMPLANT
GLOVE BIOGEL PI IND STRL 8 (GLOVE) ×2 IMPLANT
GLOVE BIOGEL PI INDICATOR 8 (GLOVE) ×1
GLOVE ECLIPSE 8.0 STRL XLNG CF (GLOVE) ×4 IMPLANT
GOWN STRL REUS W/ TWL LRG LVL3 (GOWN DISPOSABLE) ×8 IMPLANT
GOWN STRL REUS W/TWL LRG LVL3 (GOWN DISPOSABLE) ×9
KIT FILL SYSTEM UNIVERSAL (SET/KITS/TRAYS/PACK) IMPLANT
KIT MARKER MARGIN INK (KITS) ×1 IMPLANT
LIGHT WAVEGUIDE WIDE FLAT (MISCELLANEOUS) IMPLANT
MARKER SKIN DUAL TIP RULER LAB (MISCELLANEOUS) IMPLANT
NDL HYPO 25X1 1.5 SAFETY (NEEDLE) ×4 IMPLANT
NDL SAFETY ECLIPSE 18X1.5 (NEEDLE) ×2 IMPLANT
NEEDLE HYPO 18GX1.5 SHARP (NEEDLE)
NEEDLE HYPO 25X1 1.5 SAFETY (NEEDLE) ×3 IMPLANT
NS IRRIG 1000ML POUR BTL (IV SOLUTION) ×4 IMPLANT
PACK BASIN DAY SURGERY FS (CUSTOM PROCEDURE TRAY) ×3 IMPLANT
PENCIL SMOKE EVACUATOR (MISCELLANEOUS) ×3 IMPLANT
PIN SAFETY STERILE (MISCELLANEOUS) ×3 IMPLANT
PUNCH BIOPSY DERMAL 4MM (MISCELLANEOUS) IMPLANT
SHEET MEDIUM DRAPE 40X70 STRL (DRAPES) ×4 IMPLANT
SLEEVE SCD COMPRESS KNEE MED (MISCELLANEOUS) ×3 IMPLANT
SPONGE LAP 18X18 RF (DISPOSABLE) ×8 IMPLANT
SPONGE LAP 4X18 RFD (DISPOSABLE) ×2 IMPLANT
STAPLER VISISTAT 35W (STAPLE) ×3 IMPLANT
STRIP CLOSURE SKIN 1/2X4 (GAUZE/BANDAGES/DRESSINGS) IMPLANT
SUT CHROMIC 3 0 SH 27 (SUTURE) IMPLANT
SUT CHROMIC 4 0 PS 2 18 (SUTURE) ×6 IMPLANT
SUT ETHILON 2 0 FS 18 (SUTURE) ×3 IMPLANT
SUT MNCRL AB 3-0 PS2 18 (SUTURE) ×2 IMPLANT
SUT MNCRL AB 4-0 PS2 18 (SUTURE) ×5 IMPLANT
SUT MON AB 4-0 PC3 18 (SUTURE) ×2 IMPLANT
SUT SILK 2 0 PERMA HAND 18 BK (SUTURE) ×2 IMPLANT
SUT SILK 2 0 SH (SUTURE) ×1 IMPLANT
SUT VIC AB 3-0 54X BRD REEL (SUTURE) ×2 IMPLANT
SUT VIC AB 3-0 BRD 54 (SUTURE)
SUT VIC AB 3-0 SH 27 (SUTURE) ×6
SUT VIC AB 3-0 SH 27X BRD (SUTURE) IMPLANT
SUT VICRYL 0 CT-2 (SUTURE) ×3 IMPLANT
SUT VICRYL 3-0 CR8 SH (SUTURE) ×5 IMPLANT
SUT VICRYL 4-0 PS2 18IN ABS (SUTURE) ×2 IMPLANT
SUT VLOC 180 0 24IN GS25 (SUTURE) ×2 IMPLANT
SYR 50ML LL SCALE MARK (SYRINGE) ×3 IMPLANT
SYR BULB IRRIG 60ML STRL (SYRINGE) ×2 IMPLANT
SYR CONTROL 10ML LL (SYRINGE) ×5 IMPLANT
TAPE MEASURE VINYL STERILE (MISCELLANEOUS) IMPLANT
TISSUE EXPNDR FV FOURTE 400 (Prosthesis & Implant Plastic) ×6 IMPLANT
TOWEL GREEN STERILE FF (TOWEL DISPOSABLE) ×5 IMPLANT
TRAY DSU PREP LF (CUSTOM PROCEDURE TRAY) ×3 IMPLANT
TRAY FOLEY W/BAG SLVR 14FR LF (SET/KITS/TRAYS/PACK) ×1 IMPLANT
TUBE CONNECTING 20X1/4 (TUBING) ×4 IMPLANT
UNDERPAD 30X36 HEAVY ABSORB (UNDERPADS AND DIAPERS) ×6 IMPLANT
YANKAUER SUCT BULB TIP NO VENT (SUCTIONS) ×4 IMPLANT

## 2020-08-06 NOTE — H&P (Signed)
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 She has completed neoadjuvant chemotherapy and magnetic resonance imaging shows a complete response. She has seen plastic surgery has opted for bilateral nipple sparing mastectomy with reconstruction with right axillary sentinel lymph node mapping. She will also have her port removed as well.    ADDITIONAL INFORMATION: FLUORESCENCE IN-SITU HYBRIDIZATION Results: GROUP 5: HER2 **NEGATIVE** Equivocal form of amplification of the HER2 gene was detected in the IHC 2+ tissue sample received from this individual. HER2 FISH was performed by a technologist and cell imaging and analysis on the BioView. RATIO OF HER2/CEN17 SIGNALS 1.14 AVERAGE HER2 COPY NUMBER PER CELL 2.00 The ratio of HER2/CEN 17 is within the range < 2.0 of HER2/CEN 17 and a copy number of HER2 signals per cell is <4.0. Arch Pathol Lab Med 1:1,2018 [name] Pathologist, Electronic Signature ( Signed 02/08/2020) PROGNOSTIC INDICATORS Results: IMMUNOHISTOCHEMICAL AND MORPHOMETRIC ANALYSIS PERFORMED MANUALLY The tumor cells are EQUIVOCAL for Her2 (2+). Her2 by FISH will be performed and results reported separately. Estrogen Receptor: 0%, NEGATIVE Progesterone Receptor: 0%, NEGATIVE Proliferation Marker Ki67: 80% COMMENT: The negative hormone receptor study(ies) in this case has 20 internal positive control. 1 of 3 Amended copy Addendum FINAL for VERSA, CRATON ([id number]) ADDITIONAL INFORMATION:(continued) REFERENCE RANGE ESTROGEN RECEPTOR NEGATIVE 0% POSITIVE =>1% REFERENCE RANGE PROGESTERONE RECEPTOR NEGATIVE 0% POSITIVE =>1% All controls stained appropriately [name] Pathologist, Electronic Signature ( Signed 02/06/2020) FINAL  DIAGNOSIS Diagnosis Breast, right, needle core biopsy, upper outer, 10 o'clock - INVASIVE MAMMARY CARCINOMA. Microscopic Comment The carcinoma appears nuclear grade 3. The greatest linear extent of tumor in any one core is 9 mm. E-cadherin will be reported separately. Ancillary studies will be reported separately. Results reported to [facility] on 02/05/2020. Intradepartmental consultation ([name]). ADDENDUM: Immunohistochemistry         Assess breast cancer after neoadjuvant chemotherapy.  LABS: None  EXAM: BILATERAL BREAST MRI WITH AND WITHOUT CONTRAST  TECHNIQUE: Multiplanar, multisequence MR images of both breasts were obtained prior to and following the intravenous administration of 6 ml of Gadavist  Three-dimensional MR images were rendered by post-processing of the original MR data on an independent workstation. The three-dimensional MR images were interpreted, and findings are reported in the following complete MRI report for this study. Three dimensional images were evaluated at the independent DynaCad workstation  COMPARISON: MRI February 14, 2020  FINDINGS: Breast composition: d. Extreme fibroglandular tissue.  Background parenchymal enhancement: Minimal  Right breast: No mass or abnormal enhancement.  Left breast: No mass or abnormal enhancement.  Lymph nodes: No abnormal appearing lymph nodes.  Ancillary findings: None.  IMPRESSION: Complete resolution of the MRI findings associated with the patient's known malignancy. The 3 enhancing foci in the right breast described previously have resolved as well. The prominent node in the right axilla is also normal in appearance today.  RECOMMENDATION: Continued surgical and oncologic follow up.  BI-RADS CATEGORY 6: Known biopsy-proven malignancy.   Electronically Signed By: Dorise Bullion III M.D On: 06/28/2020 11:19.  The patient is a 54 year  old female.   Allergies No Known Allergies [02/12/2020]: No Known Drug Allergies [02/12/2020]: Allergies Reconciled  Medication History Escitalopram Oxalate (20MG Tablet, Oral) Active. Medications Reconciled    Vitals  07/01/2020 2:17 PM Weight: 139.25 lb  Height: 61in Body Surface Area: 1.62 m Body Mass Index: 26.31 kg/m  Temp.: 97.73F  Pulse: 116 (Regular)  BP: 112/72(Sitting, Left Arm, Standard)        Physical Exam   General Note: alopecia  Chest and Lung Exam Chest and lung exam reveals -quiet, even and easy respiratory effort with no use of accessory muscles and on auscultation, normal breath sounds, no adventitious sounds and normal vocal resonance. Inspection Chest Wall - Normal. Back - normal.  Breast Breast - Left-Symmetric, Non Tender, No Biopsy scars, no Dimpling - Left, No Inflammation, No Lumpectomy scars, No Mastectomy scars, No Peau d' Orange. Breast - Right-Symmetric, Non Tender, No Biopsy scars, no Dimpling - Right, No Inflammation, No Lumpectomy scars, No Mastectomy scars, No Peau d' Orange. Breast Lump-No Palpable Breast Mass.  Cardiovascular Cardiovascular examination reveals -normal heart sounds, regular rate and rhythm with no murmurs and normal pedal pulses bilaterally.  Neurologic Neurologic evaluation reveals -alert and oriented x 3 with no impairment of recent or remote memory. Mental Status-Normal.  Lymphatic Axillary  General Axillary Region: Bilateral - Description - Normal. Tenderness - Non Tender.    Assessment & Plan   BREAST CANCER, RIGHT (C50.911) Impression: T2 N0 MX grade 3 IDC triple negative Finished neoadjuvant chemotherapy with complete magnetic resonance imaging response  opted for bilateral NSM right SLN mapping and port removal  Risk of bleeding, infection, nipple loss, dysfunction, skin necrosis, flap necrosis, and the need for other operative  procedures. Risk of lymphedema discussed with sentinel lymph node mapping procedures well.. Discussed treatment options for breast cancer to include breast conservation vs mastectomy with reconstruction. Pt has decided on mastectomy. Risk include bleeding, infection, flap necrosis, pain, numbness, recurrence, hematoma, other surgery needs. Pt understands and agrees to proceed. Risk of sentinel lymph node mapping include bleeding, infection, lymphedema, shoulder pain. stiffness, dye allergy. cosmetic deformity , blood clots, death, need for more surgery. Pt agrees to proceed.  TOTA TIME 45 minutes reviewing mammogram, biopsy, discussed the pathophysiology, treatment options, complications of treatment, physical exam, and EHR Pt requires port placement for chemotherapy. Risk include bleeding, infection, pneumothorax, hemothorax, mediastinal injury, nerve injury , blood vessel injury, strke, blood clots, death, migration. embolization and need for additional procedures. Pt agrees to proceed.  Current Plans You are being scheduled for surgery- Our schedulers will call you.  You should hear from our office's scheduling department within 5 working days about the location, date, and time of surgery. We try to make accommodations for patient's preferences in scheduling surgery, but sometimes the OR schedule or the surgeon's schedule prevents [place] from making those accommodations.  If you have not heard from our office [phone]) in 5 working days, call the office and ask for your surgeon's nurse.  If you have other questions about your diagnosis, plan, or surgery, call the office and ask for your surgeon's nurse.  Pt Education - [facility] Mastectomy HCI Pt Education - [facility] Breast Pains Education Pt Education - ABC (After Breast Cancer) Class Info: discussed with patient and provided information.

## 2020-08-06 NOTE — Op Note (Signed)
Operative Note   DATE OF OPERATION: 9.21.21  LOCATION: [facility]-observation  SURGICAL DIVISION: Plastic Surgery  PREOPERATIVE DIAGNOSES:  1. Right breast cancer UOQ ER- 2. Neoadjuvant chemotherapy  POSTOPERATIVE DIAGNOSES:  same  PROCEDURE:  1. Bilateral breast reconstruction with tissue expanders 2. Acellular dermis (Alloderm) to bilateral chest 600 cm2  SURGEON: Irene Limbo MD MBA  ASSISTANT: none  ANESTHESIA:  General.   EBL: 350 ml for entire procedure  COMPLICATIONS: None immediate.   INDICATIONS FOR PROCEDURE:  The patient, Virginia [name], is a 54 y.o. female born on May 17, 1966, is here for immediate expander based prepectoral reconstruction following bilateral nipple sparing mastectomies.   FINDINGS: Natrelle 133S FV-12-T 400 ml tissue expanders placed bilateral initial fill volume 250 ml air bilateral. RIGHT SN [id number] LEFT [id number]  DESCRIPTION OF PROCEDURE:  The patient was marked with the patient in the preoperative area to mark sternal notch, chest midline, anterior axillary lines and inframammary folds.The patient's operative site was prepped and draped in a sterile fashion. A time out was performed and all information was confirmed to be correct.I assisted in mastectomies with exposure and retraction.Following completion of mastectomies, reconstruction began on left side.  The cavity was irrigated withsaline followed by salinesolution containing Ancef, gentamicin and betadine. Hemostasis was ensured.A 19 Fr drain was placed in subcutaneous position laterally and a 15 Fr drain placed along inframammary fold. Each secured to skin with 2-0 nylon. The tissue expanders were prepared on back table prior in insertion. The expander was filled with air.Perforated acellular dermiswasdraped over anterior surface expander. The ADM was then secured to itself over posterior surface of expanderwith 4-0 chromic. Redundant folds acellular dermis excised so that  the ADM layflat without folds over air filled expander.The expander was secured tofascia over lateral sternal borderwith a 0 vicryl. Thelateral tab wasalso secured to pectoralis muscle with 0-vicryl. The ADM was secured to pectoralis muscle and chest wall along inferior border at inframammary foldwith 0 V-lock suture.Laterally the mastectomy flap over posterior axillary line was advanced anteriorly and the subcutaneous tissue and superficial fascia was secured to pectoralisand serratusmuscleswith 0-vicryl. Skin closure completedwith 3-0 vicryl in fascial layer and 4-0 vicryl in dermis. Skin closure completed with 4-0 monocryl subcuticular and tissue adhesive.  I then directed my attention torightchest where similar irrigation and drain placement completed. The prepared expander with ADM secured over anterior surface was placed inleftchest and tabs secured to chest wall and pectoralis muscle with 0- vicryl suture. The acellular dermis at inframammary fold was secured to chest wall with 0 V-lock suture.Laterally the mastectomy flap over posterior axillary line was advanced anteriorly and the subcutaneous tissue and superficial fascia was secured to pectoralisand serratusmuscleswith 0-vicryl. Skin closure completedwith 3-0 vicryl in fascial layer and 4-0 vicryl in dermis. Skin closure completed with 4-0 monocryl subcuticular and tissue adhesive.The mastectomy flaps were redrapedso that NAC was symmetric from sternal notch and chest midline. Tegaderm dressings applied followed bydry dressing,breast binder.  The patient was allowed to wake from anesthesia, extubated and taken to the recovery room in satisfactory condition.   SPECIMENS: none  DRAINS: 15 and 19 Fr JP in right and left breast reconstruction

## 2020-08-06 NOTE — Interval H&P Note (Signed)
History and Physical Interval Note:  08/06/2020 7:14 AM  Virginia [name]  has presented today for surgery, with the diagnosis of right breast ca UOQ ERnegative.  The various methods of treatment have been discussed with the patient and family. After consideration of risks, benefits and other options for treatment, the patient has consented to  Procedure(s) with comments: BILATERAL BREAST RECONSTRUCTION WITH PLACEMENT OF TISSUE EXPANDER AND ALLODERM (Bilateral) BILATERAL NIPPLE SPARING MASTECTOMIES WITH RIGHT AXILLARY  SENTINEL LYMPH NODE MAPPING (Bilateral) - PECTORAL BLOCK REMOVAL PORT-A-CATH (N/A) - CORNETT TO START FIRST as a surgical intervention.  The patient's history has been reviewed, patient examined, no change in status, stable for surgery.  I have reviewed the patient's chart and labs.  Questions were answered to the patient's satisfaction.     Arnoldo Hooker Caryl Manas

## 2020-08-06 NOTE — Op Note (Signed)
Preoperative diagnosis: Stage I right breast cancer upper outer quadrant  Postoperative diagnosis: Same  Procedure: Bilateral nipple sparing mastectomy with removal of right subclavian Port-A-Cath and right axillary sentinel lymph node mapping using methylene blue dye  Surgeon: Erroll Luna, MD  Assistant: Judyann Munson, RNFA  Anesthesia: General  EBL: 200 cc  IV fluids: Per anesthesia record  Indications for procedure: Patient presents for bilateral nipple sparing mastectomy after receiving neoadjuvant chemotherapy for stage I right breast cancer upper outer quadrant.  This was a triple negative cancer.  She opted for contralateral left risk reducing mastectomy given her elevated lifetime risk of breast cancer.  She was seen by plastic surgery as well for reconstruction and she opted for nipple preservation as well.The surgical and non surgical options have been discussed with the patient.  Risks of surgery include bleeding,  Infection,  Flap necrosis,  Tissue loss,  Chronic pain, death, Numbness,  And the need for additional procedures.  Reconstruction options also have been discussed with the patient as well.  The patient agrees to proceed.Sentinel lymph node mapping and dissection has been discussed with the patient.  Risk of bleeding,  Infection,  Seroma formation,  Additional procedures,,  Shoulder weakness ,  Shoulder stiffness,  Nerve and blood vessel injury and reaction to the mapping dyes have been discussed.  Alternatives to surgery have been discussed with the patient.  The patient agrees to proceed.  Description of procedure: The patient was met in the holding area and questions were answered.  She underwent right breast injection with technetium sulfur colloid for mapping.  She also went bilateral pectoral blocks for pain control.  All questions were answered.  She was taken back to the operating.  She is placed supine upon the OR table.  Induction general esthesia both breasts  were prepped and draped in sterile fashion as well as her arms and upper chest and prepped in a sterile fashion.  Timeout performed.  4 cc methylene blue dye were injected to the right breast subareolar position massaged for 5 minutes.  Neoprobe was used to identify the hotspot in the right insula.  A 3 cm incision was made in the right axilla.  Dissection was carried down and there were 2 blue and hot sentinel nodes removed and sent off to pathology from the level 1 lymph node basin.  Background counts approaches 0.  The cavities made hemostatic with cautery and closed with 3-0 Vicryl for Monocryl.  A right infra mammary incision was made.  Dissection was carried down to the pectoralis fascia.  The breast was mobilized off the right pectoralis fashion to include the fascia to the midline of the sternum, up to the clavicle and laterally to the right extra contents.  Once this was mobilized long Metzenbaum scissors were used to create tunnels under the skin with blunt dissection and sharp dissection.  Once these tunnels were created cautery was used to connect the tunnels to help mobilize the breast tissue off the posterior surface of the skin and nipple.  A biopsy of the right nipple was taken and sent for frozen section which came back as negative.  We continued our dissection up toward the clavicle using the cautery as well as sharp dissection with long Metzenbaum scissors to detach the breast from the underlying skin.  This was done superiorly and laterally toward the axilla and more laterally toward the lateral attachments.  The breast was then detached from lateral attachments and the chest wall using cautery.  The entire specimen was delivered out of the field.  The skin was viable as well as the nipple.  This was oriented with ink and passed off the field.  The left breast was removed in a similar fashion.  An inframammary incision was used.  Dissection was carried on the chest wall in the chest  musculature.  The breast was mobilized off the pectoralis major muscle using cautery to include the fascia.  This was done to the midline of the sternum, clavicle superiorly and lateral attachments laterally.  Once this was fully mobilized the long Metzenbaum scissors were used to create subcutaneous tunnels under the skin using blunt dissection to prevent injury to the skin.  These tunnels were connected with sharp dissection as well as cautery.  The nipple was encountered and dissected away from the breast.  A biopsy of the posterior aspect of the left nipple was also benign.  Made the breast tissue was dissected raising, sharp dissection and cautery as needed to mobilize the breast tissue away from the skin.  Once the skin was fully mobilized the breast was then dissected off the remainder of the chest wall dividing the medial attachments, superior attachments lateral attachments using cautery down to the chest wall.  The entire breast was removed and passed off the field and oriented.  Incision was then made where the Port-A-Cath was located.  Dissection was carried down the catheter was was identified.  The suture was cut intact catheter was removed in its entirety.  A pursestring suture of 0 Vicryl was placed to close the tract so there was no backbleeding.  Wound closed with 3-0 Vicryl and 4-0 Monocryl.  Hemostasis was achieved in both mastectomy sites with cautery.  This point time the case turned over to plastic surgery for completion reconstruction.  Please see this note for any additional details.  EBL at this point was 200 cc.  The patient was hemodynamically stable.  All counts were correct.

## 2020-08-06 NOTE — Interval H&P Note (Signed)
History and Physical Interval Note:  08/06/2020 7:17 AM  Virginia [name]  has presented today for surgery, with the diagnosis of right breast ca UOQ ERnegative.  The various methods of treatment have been discussed with the patient and family. After consideration of risks, benefits and other options for treatment, the patient has consented to  Procedure(s) with comments: BILATERAL BREAST RECONSTRUCTION WITH PLACEMENT OF TISSUE EXPANDER AND ALLODERM (Bilateral) BILATERAL NIPPLE SPARING MASTECTOMIES WITH RIGHT AXILLARY  SENTINEL LYMPH NODE MAPPING (Bilateral) - PECTORAL BLOCK REMOVAL PORT-A-CATH (N/A) - [name] TO START FIRST as a surgical intervention.  The patient's history has been reviewed, patient examined, no change in status, stable for surgery.  I have reviewed the patient's chart and labs.  Questions were answered to the patient's satisfaction.     Turner Daniels MD

## 2020-08-06 NOTE — Progress Notes (Signed)
Assisted Dr. Oddono with right, ultrasound guided, pectoralis block. Side rails up, monitors on throughout procedure. See vital signs in flow sheet. Tolerated Procedure well. 

## 2020-08-06 NOTE — Transfer of Care (Signed)
Immediate Anesthesia Transfer of Care Note  Patient: [name]  Procedure(s) Performed: BILATERAL BREAST RECONSTRUCTION WITH PLACEMENT OF TISSUE EXPANDER AND ALLODERM (Bilateral Breast) BILATERAL NIPPLE SPARING MASTECTOMIES WITH RIGHT AXILLARY  SENTINEL LYMPH NODE MAPPING (Bilateral Breast) REMOVAL PORT-A-CATH (Right Chest)  Patient Location: PACU  Anesthesia Type:GA combined with regional for post-op pain  Level of Consciousness: drowsy and patient cooperative  Airway & Oxygen Therapy: Patient Spontanous Breathing and Patient connected to face mask oxygen  Post-op Assessment: Report given to RN and Post -op Vital signs reviewed and stable  Post vital signs: Reviewed and stable  Last Vitals:  Vitals Value Taken Time  BP 107/62 08/06/20 1226  Temp    Pulse 78 08/06/20 1228  Resp 12 08/06/20 1228  SpO2 100 % 08/06/20 1228  Vitals shown include unvalidated device data.  Last Pain:  Vitals:   08/06/20 0634  TempSrc: Oral  PainSc: 0-No pain      Patients Stated Pain Goal: 4 (67/70/34 0352)  Complications: No complications documented.

## 2020-08-06 NOTE — Anesthesia Procedure Notes (Signed)
Anesthesia Regional Block: Pectoralis block   Pre-Anesthetic Checklist: ,, timeout performed, Correct Patient, Correct Site, Correct Laterality, Correct Procedure, Correct Position, site marked, Risks and benefits discussed,  Surgical consent,  Pre-op evaluation,  At surgeon's request and post-op pain management  Laterality: Right  Prep: chloraprep       Needles:  Injection technique: Single-shot  Needle Type: Echogenic Stimulator Needle     Needle Length: 5cm  Needle Gauge: 22     Additional Needles:   Procedures:, nerve stimulator,,, ultrasound used (permanent image in chart),,,,  Narrative:  Start time: 08/06/2020 7:00 AM End time: 08/06/2020 7:05 AM Injection made incrementally with aspirations every 5 mL.  Performed by: Personally  Anesthesiologist: Janeece Riggers, MD  Additional Notes: Functioning IV was confirmed and monitors were applied.  A 46mm 22ga Arrow echogenic stimulator needle was used. Sterile prep and drape,hand hygiene and sterile gloves were used. Ultrasound guidance: relevant anatomy identified, needle position confirmed, local anesthetic spread visualized around nerve(s)., vascular puncture avoided.  Image printed for medical record. Negative aspiration and negative test dose prior to incremental administration of local anesthetic. The patient tolerated the procedure well.

## 2020-08-06 NOTE — Progress Notes (Signed)
Nuc med at bedside for injections on the right side.  Pt tolerated well, VSS.

## 2020-08-06 NOTE — Anesthesia Procedure Notes (Signed)
Procedure Name: Intubation Date/Time: 08/06/2020 7:38 AM Performed by: Signe Colt, CRNA Pre-anesthesia Checklist: Patient identified, Emergency Drugs available, Suction available and Patient being monitored Patient Re-evaluated:Patient Re-evaluated prior to induction Oxygen Delivery Method: Circle system utilized Preoxygenation: Pre-oxygenation with 100% oxygen Induction Type: IV induction Ventilation: Mask ventilation without difficulty Laryngoscope Size: Mac and 3 Grade View: Grade I Tube type: Oral Tube size: 7.0 mm Number of attempts: 1 Airway Equipment and Method: Stylet and Oral airway Placement Confirmation: ETT inserted through vocal cords under direct vision,  positive ETCO2 and breath sounds checked- equal and bilateral Secured at: 21 cm Tube secured with: Tape Dental Injury: Teeth and Oropharynx as per pre-operative assessment

## 2020-08-07 ENCOUNTER — Encounter (HOSPITAL_BASED_OUTPATIENT_CLINIC_OR_DEPARTMENT_OTHER): Payer: Self-pay | Admitting: Plastic Surgery

## 2020-08-07 DIAGNOSIS — Z9221 Personal history of antineoplastic chemotherapy: Secondary | ICD-10-CM | POA: Diagnosis not present

## 2020-08-07 DIAGNOSIS — Z171 Estrogen receptor negative status [ER-]: Secondary | ICD-10-CM | POA: Diagnosis not present

## 2020-08-07 DIAGNOSIS — M1990 Unspecified osteoarthritis, unspecified site: Secondary | ICD-10-CM | POA: Diagnosis not present

## 2020-08-07 DIAGNOSIS — C50411 Malignant neoplasm of upper-outer quadrant of right female breast: Secondary | ICD-10-CM | POA: Diagnosis not present

## 2020-08-07 DIAGNOSIS — F419 Anxiety disorder, unspecified: Secondary | ICD-10-CM | POA: Diagnosis not present

## 2020-08-07 DIAGNOSIS — Z79899 Other long term (current) drug therapy: Secondary | ICD-10-CM | POA: Diagnosis not present

## 2020-08-07 LAB — SURGICAL PATHOLOGY

## 2020-08-07 NOTE — Anesthesia Postprocedure Evaluation (Signed)
Anesthesia Post Note  Patient: Virginia [name]  Procedure(s) Performed: BILATERAL BREAST RECONSTRUCTION WITH PLACEMENT OF TISSUE EXPANDER AND ALLODERM (Bilateral Breast) BILATERAL NIPPLE SPARING MASTECTOMIES WITH RIGHT AXILLARY  SENTINEL LYMPH NODE MAPPING (Bilateral Breast) REMOVAL PORT-A-CATH (Right Chest)     Patient location during evaluation: PACU Anesthesia Type: General Level of consciousness: awake and alert Pain management: pain level controlled Vital Signs Assessment: post-procedure vital signs reviewed and stable Respiratory status: spontaneous breathing, nonlabored ventilation, respiratory function stable and patient connected to nasal cannula oxygen Cardiovascular status: blood pressure returned to baseline and stable Postop Assessment: no apparent nausea or vomiting Anesthetic complications: no   No complications documented.  Last Vitals:  Vitals:   08/06/20 2030 08/07/20 0330  BP:  103/63  Pulse: 87 87  Resp: 18   Temp: 36.7 C 36.8 C  SpO2: 98% 99%    Last Pain:  Vitals:   08/07/20 0700  TempSrc:   PainSc: Asleep   Pain Goal: Patients Stated Pain Goal: 4 (08/06/20 1791)                 [name]

## 2020-08-07 NOTE — Discharge Summary (Signed)
Physician Discharge Summary  Patient ID: Virginia [name] MRN: [id number] DOB/AGE: October 03, 1966 55 y.o.  Admit date: 08/06/2020 Discharge date: 08/07/2020  Admission Diagnoses: Right breast cancer  Discharge Diagnoses:  Active Problems:   Breast cancer, right [facility])  Discharged Condition: stable  Hospital Course: Post operatively patient tolerated diet and pain controlled with oral medications. Incision and drain care reviewed. Patient ambulatory with minimal assist.  Treatments: surgery: bilateral nipple sparing mastectomies right SLN bilateral breast reconstruction with tissue expanders acellular dermis 9.21.21  Discharge Exam: Blood pressure 103/63, pulse 87, temperature 98.3 F (36.8 C), resp. rate 18, height 5\' 1"  (1.549 m), weight 63.9 kg, SpO2 99 %. Incision/Wound: chest soft with Tegaderms in place evolving ecchymoses without hematoma drains serosanguinous  Disposition: Discharge disposition: 01-Home or Self Care       Discharge Instructions    Call MD for:  redness, tenderness, or signs of infection (pain, swelling, bleeding, redness, odor or green/yellow discharge around incision site)   Complete by: As directed    Call MD for:  temperature >100.5   Complete by: As directed    Discharge instructions   Complete by: As directed    Ok to remove dressings and shower am 9.23.21. Soap and water ok, pat Tegaderms dry. Do not remove Tegaderms. No creams or ointments over incisions. Do not let drains dangle in shower, attach to lanyard or similar.Strip and record drains twice daily and bring log to clinic visit.  Breast binder or soft compression bra all other times.  Ok to raise arms above shoulders for bathing and dressing.  No house yard work or exercise until cleared by MD.   Recommend ibuprofen with meals to aid with pain control. Also ok to use Tylenol for pain. Ice packs to chest as desired for comfort. Recommend Miralax or Dulcolax as needed for  constipation.  Patient received all Rx preoperatively   Driving Restrictions   Complete by: As directed    No driving for 2 weeks then no driving if taking narcotics   Lifting restrictions   Complete by: As directed    No lifting > 5 lbs until cleared by MD   Resume previous diet   Complete by: As directed      Allergies as of 08/07/2020   No Known Allergies     Medication List    TAKE these medications   escitalopram 20 MG tablet Commonly known as: LEXAPRO TAKE 1 TABLET (20 MG TOTAL) BY MOUTH DAILY.   lidocaine-prilocaine cream Commonly known as: EMLA Apply to affected area once       Follow-up Information    Cornett, [name], MD In 3 weeks.   Specialty: General Surgery Contact information: [address] [place] [address] [phone]        Irene Limbo, MD In 1 week.   Specialty: Plastic Surgery Why: as scheduled Contact information: [address] [address] [place] [address] [phone]               Signed: Irene Limbo 08/07/2020, 7:55 AM

## 2020-08-07 NOTE — Discharge Instructions (Signed)
Next dose of Ibuprofen/Motrin can be given at 2:00pm if needed. Next dose of Tylenol can be given at 10:00am if needed.    About my Jackson-Pratt Bulb Drain  What is a Jackson-Pratt bulb? A Jackson-Pratt is a soft, round device used to collect drainage. It is connected to a long, thin drainage catheter, which is held in place by one or two small stiches near your surgical incision site. When the bulb is squeezed, it forms a vacuum, forcing the drainage to empty into the bulb.  Emptying the Jackson-Pratt bulb- To empty the bulb: 1. Release the plug on the top of the bulb. 2. Pour the bulb's contents into a measuring container which your nurse will provide. 3. Record the time emptied and amount of drainage. Empty the drain(s) as often as your     doctor or nurse recommends.  Date                  Time                    Amount (Drain 1)                 Amount (Drain 2)  _____________________________________________________________________  _____________________________________________________________________  _____________________________________________________________________  _____________________________________________________________________  _____________________________________________________________________  _____________________________________________________________________  _____________________________________________________________________  _____________________________________________________________________  Squeezing the Jackson-Pratt Bulb- To squeeze the bulb: 1. Make sure the plug at the top of the bulb is open. 2. Squeeze the bulb tightly in your fist. You will hear air squeezing from the bulb. 3. Replace the plug while the bulb is squeezed. 4. Use a safety pin to attach the bulb to your clothing. This will keep the catheter from     pulling at the bulb insertion site.  When to call your doctor- Call your doctor if:  Drain site becomes red, swollen or  hot.  You have a fever greater than 101 degrees F.  There is oozing at the drain site.  Drain falls out (apply a guaze bandage over the drain hole and secure it with tape).  Drainage increases daily not related to activity patterns. (You will usually have more drainage when you are active than when you are resting.)  Drainage has a bad odor.

## 2020-08-12 ENCOUNTER — Encounter: Payer: Self-pay | Admitting: *Deleted

## 2020-08-12 NOTE — Progress Notes (Signed)
Patient Care Team: Virginia Crews, MD as PCP - General (Family Medicine) Mauro Kaufmann, RN as Oncology Nurse Navigator Rockwell Germany, RN as Oncology Nurse Navigator  DIAGNOSIS:    ICD-10-CM   1. Malignant neoplasm of upper-outer quadrant of right breast in female, estrogen receptor negative ([place])  C50.411    Z17.1     SUMMARY OF ONCOLOGIC HISTORY: Oncology History  Malignant neoplasm of upper-outer quadrant of right breast in female, estrogen receptor negative ([place])  02/02/2020 Initial Diagnosis   Right breast lump tenderness. Diagnostic mammogram and [place] on 02/01/20 showed a 2.0cm mass at the 10 o'clock position with no axillary adenopathy. 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, ER/PR negative, Ki67 80%   02/08/2020 Cancer Staging   Staging form: Breast, AJCC 8th Edition - Clinical stage from 02/08/2020: Stage IB (cT1c, cN0, cM0, G3, ER-, PR-, HER2-) - Signed by Nicholas Lose, MD on 02/08/2020   02/23/2020 -  Chemotherapy   The patient had dexamethasone (DECADRON) 4 MG tablet, 1 of 1 cycle, Start date: 02/08/2020, End date: 04/22/2020 DOXOrubicin (ADRIAMYCIN) chemo injection 98 mg, 60 mg/m2 = 98 mg, Intravenous,  Once, 4 of 4 cycles Administration: 98 mg (02/23/2020), 98 mg (03/08/2020), 98 mg (03/22/2020), 98 mg (04/05/2020) palonosetron (ALOXI) injection 0.25 mg, 0.25 mg, Intravenous,  Once, 7 of 8 cycles Administration: 0.25 mg (02/23/2020), 0.25 mg (04/22/2020), 0.25 mg (03/08/2020), 0.25 mg (03/22/2020), 0.25 mg (04/05/2020), 0.25 mg (05/17/2020), 0.25 mg (06/07/2020) pegfilgrastim (NEULASTA ONPRO KIT) injection 6 mg, 6 mg, Subcutaneous, Once, 4 of 4 cycles Administration: 6 mg (02/23/2020), 6 mg (03/08/2020), 6 mg (03/22/2020), 6 mg (04/05/2020) CARBOplatin (PARAPLATIN) 630 mg in sodium chloride 0.9 % 250 mL chemo infusion, 700 mg (100 % of original dose 700 mg), Intravenous,  Once, 3 of 3 cycles Dose modification: 700 mg (original dose 700 mg, Cycle 5), 637.2 mg  (original dose 700 mg, Cycle 6, Reason: Change in SCr/CrCl),   (original dose 700 mg, Cycle 6, Reason: Dose not tolerated) Administration: 630 mg (04/22/2020), 530 mg (05/17/2020), 530 mg (06/07/2020) cyclophosphamide (CYTOXAN) 980 mg in sodium chloride 0.9 % 250 mL chemo infusion, 600 mg/m2 = 980 mg, Intravenous,  Once, 4 of 4 cycles Administration: 980 mg (02/23/2020), 980 mg (03/08/2020), 980 mg (03/22/2020), 980 mg (04/05/2020) PACLitaxel (TAXOL) 132 mg in sodium chloride 0.9 % 250 mL chemo infusion (</= 43m/m2), 80 mg/m2 = 132 mg, Intravenous,  Once, 3 of 4 cycles Dose modification: 60 mg/m2 (original dose 80 mg/m2, Cycle 5, Reason: Dose not tolerated), 45 mg/m2 (original dose 80 mg/m2, Cycle 7, Reason: Provider Judgment) Administration: 132 mg (04/22/2020), 132 mg (05/03/2020), 96 mg (05/09/2020), 96 mg (05/17/2020), 96 mg (05/24/2020), 96 mg (05/31/2020), 96 mg (06/07/2020), 72 mg (06/14/2020) fosaprepitant (EMEND) 150 mg in sodium chloride 0.9 % 145 mL IVPB, 150 mg, Intravenous,  Once, 7 of 8 cycles Administration: 150 mg (02/23/2020), 150 mg (04/22/2020), 150 mg (03/08/2020), 150 mg (03/22/2020), 150 mg (04/05/2020), 150 mg (05/17/2020), 150 mg (06/07/2020)  for chemotherapy treatment.     Genetic Testing   Negative genetic testing. No pathogenic variants identified on the [facility] Common Hereditary Cancers Panel. The report date is 02/25/2020.   The Common Hereditary Cancers Panel offered by [facility] includes sequencing and/or deletion duplication testing of the following 48 genes: APC, ATM, AXIN2, BARD1, BMPR1A, BRCA1, BRCA2, BRIP1, CDH1, CDKN2A (p14ARF), CDKN2A (p16INK4a), CKD4, CHEK2, CTNNA1, DICER1, EPCAM (Deletion/duplication testing only), GREM1 (promoter region deletion/duplication testing only), KIT, MEN1, MLH1, MSH2, MSH3, MSH6,  MUTYH, NBN, NF1, NHTL1, PALB2, PDGFRA, PMS2, POLD1, POLE, PTEN, RAD50, RAD51C, RAD51D, RNF43, SDHB, SDHC, SDHD, SMAD4, SMARCA4. STK11, TP53, TSC1, TSC2, and VHL.  The following genes were  evaluated for sequence changes only: SDHA and HOXB13 c.251G>A variant only.   08/06/2020 Surgery   Bilateral mastectomies ([place]): no residual invasive carcinoma in the right breast and no evidence of malignancy on the left breast, with 2 right axillary lymph nodes negative for carcinoma.     CHIEF COMPLIANT: Follow-up s/p bilateral mastectomies  INTERVAL HISTORY: Virginia [name] is a 54 y.o. with above-mentioned history of breast cancer who completed neoadjuvant chemotherapy. She underwent bilateral mastectomies on 08/06/20 with Dr. Iran Planas for which pathology showed no residual invasive carcinoma in the right breast and no evidence of malignancy on the left breast, with 2 right axillary lymph nodes negative for carcinoma. She presents to the clinic today for follow-up.   ALLERGIES:  has No Known Allergies.  MEDICATIONS:  Current Outpatient Medications  Medication Sig Dispense Refill  . escitalopram (LEXAPRO) 20 MG tablet TAKE 1 TABLET (20 MG TOTAL) BY MOUTH DAILY. 90 tablet 1  . lidocaine-prilocaine (EMLA) cream Apply to affected area once 30 g 3   No current facility-administered medications for this visit.    PHYSICAL EXAMINATION: ECOG PERFORMANCE STATUS: 1 - Symptomatic but completely ambulatory  There were no vitals filed for this visit. There were no vitals filed for this visit.  LABORATORY DATA:  I have reviewed the data as listed CMP Latest Ref Rng & Units 08/02/2020 06/21/2020 06/14/2020  Glucose 70 - 99 mg/dL 73 87 83  BUN 6 - 20 mg/dL _0 Creatinine 0.44 - 1.00 mg/dL 0.72 0.66 0.61  Sodium 135 - 145 mmol/L 138 138 141  Potassium 3.5 - 5.1 mmol/L 4.5 4.0 3.9  Chloride 98 - 111 mmol/L 105 106 107  CO2 22 - 32 mmol/L _1 Calcium 8.9 - 10.3 mg/dL 9.3 9.5 9.7  Total Protein 6.5 - 8.1 g/dL 6.5 6.3(L) 6.3(L)  Total Bilirubin 0.3 - 1.2 mg/dL 0.6 0.4 0.4  Alkaline Phos 38 - 126 U/L 64 67 67  AST 15 - 41 U/L 30 37 16  ALT 0 - 44 U/L 40 52(H) 21    Lab  Results  Component Value Date   WBC 4.7 08/02/2020   HGB 11.2 (L) 08/02/2020   HCT 33.9 (L) 08/02/2020   MCV 95.0 08/02/2020   PLT 183 08/02/2020   NEUTROABS 2.7 08/02/2020    ASSESSMENT & PLAN:  Malignant neoplasm of upper-outer quadrant of right breast in female, estrogen receptor negative ([place]) 02/02/2020:Right breast lump tenderness. Diagnostic mammogram and [place] on 02/01/20 showed a 2.0cm mass at the 10 o'clock position with no axillary adenopathy. 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FISH negative, ER/PR negative, Ki67 80% T1CN0 stage Ib  Treatment: 1.Neoadjuvant chemotherapywith dose dense Adriamycin and Cytoxan x4 followed by Taxol and carboplatinx7 discontinued 06/14/2020 2.08/06/2020: Bilateral mastectomies with reconstruction  right mastectomy: No residual invasive cancer, 0/2 sentinel lymph nodes negative: Pathologic complete response Left mastectomy: Benign   Pathology counseling: I discussed the final pathology report of the patient provided  a copy of this report. I discussed the margins as well as lymph node surgeries. We also discussed the final staging along with previously performed ER/PR and HER-2/neu testing.  We are very excited about the phenomenal response to treatment.  This indicates a very good prognosis. Return to clinic in 3 months for survivorship care  plan visit I can see her once a year after that.    No orders of the defined types were placed in this encounter.  The patient has a good understanding of the overall plan. she agrees with it. she will call with any problems that may develop before the next visit here.  Total time spent: 20 mins including face to face time and time spent for planning, charting and coordination of care  Nicholas Lose, MD 08/13/2020  I, Cloyde Reams Dorshimer, am acting as scribe for Dr. Nicholas Lose.  I have reviewed the above documentation for accuracy and completeness, and I agree with the  above.

## 2020-08-13 ENCOUNTER — Encounter: Payer: Self-pay | Admitting: *Deleted

## 2020-08-13 ENCOUNTER — Other Ambulatory Visit: Payer: Self-pay

## 2020-08-13 ENCOUNTER — Inpatient Hospital Stay: Payer: 59 | Attending: Hematology and Oncology | Admitting: Hematology and Oncology

## 2020-08-13 DIAGNOSIS — C50411 Malignant neoplasm of upper-outer quadrant of right female breast: Secondary | ICD-10-CM | POA: Diagnosis not present

## 2020-08-13 DIAGNOSIS — Z79899 Other long term (current) drug therapy: Secondary | ICD-10-CM | POA: Insufficient documentation

## 2020-08-13 DIAGNOSIS — Z171 Estrogen receptor negative status [ER-]: Secondary | ICD-10-CM | POA: Diagnosis not present

## 2020-08-13 DIAGNOSIS — Z9013 Acquired absence of bilateral breasts and nipples: Secondary | ICD-10-CM | POA: Diagnosis not present

## 2020-08-13 NOTE — Assessment & Plan Note (Signed)
02/02/2020:Right breast lump tenderness. Diagnostic mammogram and [place] on 02/01/20 showed a 2.0cm mass at the 10 o'clock position with no axillary adenopathy. 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FISH negative, ER/PR negative, Ki67 80% T1CN0 stage Ib  Treatment: 1.Neoadjuvant chemotherapywith dose dense Adriamycin and Cytoxan x4 followed by Taxol and carboplatinx7 discontinued 06/14/2020 2.08/06/2020: Right mastectomy: No residual invasive cancer, 0/2 sentinel lymph nodes negative: Pathologic complete response 3.Adjuvant radiation therapy  Pathology counseling: I discussed the final pathology report of the patient provided  a copy of this report. I discussed the margins as well as lymph node surgeries. We also discussed the final staging along with previously performed ER/PR and HER-2/neu testing.  We are very excited about the phenomenal response to treatment.  This indicates a very good prognosis. The next step in the treatment is radiation.  We will see her at the end of radiation.

## 2020-08-21 ENCOUNTER — Other Ambulatory Visit (HOSPITAL_COMMUNITY): Payer: Self-pay | Admitting: Plastic Surgery

## 2020-08-22 MED FILL — AMOX-CLAV 875-125 MG TABLET: 875-125 | 7 days supply | Qty: 14 | Fill #0

## 2020-08-24 NOTE — H&P (Signed)
  Subjective:     Patient ID: Virginia [name] is a 54 y.o. female.  HPI 2.5 weeks post op bilateral mastectomies with immediate expander based reconstruction. Onset fevers evening drains removed, on Augmentin. Called to report 1 day history of redness chest right with swelling. Notes associated back pain.  Presented with palpable mass right breast. MMG/US showed 2 cm mass 10 o clock, no axillary lymphadenopathy. 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  MRI demonstrated 2 cm mass at 10 o clock, three indeterminate 4 mm, 5 mm, and 5 mm masses, single prominent axillary node. Second look axillary [place] without lymphadenopathy.  Staging scans negative for distant disease, 5 mm axillary node noted.  Completed neoadjuvant chemotherapy. Final MRI with complete imaging resolution including node.  Final pathology no residual invasice carcinoma, 0/2 SLN  Genetics negative  Prior 34 B happy with this. Right mastectomy 329 g Left 323 g  PMH significant for CP, repair done at [facility]  Works for [employer], works on point of care machines. Lives with husband and adult son.      Objective:   Physical Exam  Cardiovascular: Regular rhythm, normal heart sounds and normal pulses.  Pulmonary/Chest: Effort normal and breath sounds normal.    Chest:  incisions intact  Right chest with edema cellulitis NAC both with dry eschar nipple    Assessment:     Right breast ca UOQ ER- Neoadjuvant chemotherapy S/p bilateral NSM, prepectoral TE/ADM (Alloderm) reconstruction, Right SLN    Plan:     Fevers cellulitis right chest. Recommend removal right chest TE and likely acellular dermis. Plan OR in am. Instructions or arrival and NPO status given. Reviewed will adjust antibiotics as needed pending any intraop cultures. Reviewed replacement TE in 7-79 weeks once certain infection cleared. Will plan debridement nipple eschar bilateral at this time.  Natrelle 133S FV-12-T 400 ml  tissue expanders placed bilateral initial fill volume 250 ml air bilateral

## 2020-08-25 ENCOUNTER — Inpatient Hospital Stay (HOSPITAL_COMMUNITY): Payer: 59 | Admitting: Certified Registered Nurse Anesthetist

## 2020-08-25 ENCOUNTER — Encounter (HOSPITAL_COMMUNITY)
Admission: RE | Disposition: A | Discharge status: Home / Self Care | Payer: Self-pay | Source: Ambulatory Visit | Attending: Plastic Surgery

## 2020-08-25 ENCOUNTER — Encounter (HOSPITAL_COMMUNITY): Payer: Self-pay | Admitting: Plastic Surgery

## 2020-08-25 ENCOUNTER — Ambulatory Visit (HOSPITAL_COMMUNITY)
Admission: RE | Admit: 2020-08-25 | Discharge: 2020-08-25 | Disposition: A | Discharge status: Home / Self Care | Payer: 59 | Source: Ambulatory Visit | Attending: Plastic Surgery | Admitting: Plastic Surgery

## 2020-08-25 ENCOUNTER — Other Ambulatory Visit: Payer: Self-pay

## 2020-08-25 DIAGNOSIS — L089 Local infection of the skin and subcutaneous tissue, unspecified: Secondary | ICD-10-CM | POA: Diagnosis not present

## 2020-08-25 DIAGNOSIS — L7682 Other postprocedural complications of skin and subcutaneous tissue: Secondary | ICD-10-CM | POA: Insufficient documentation

## 2020-08-25 DIAGNOSIS — N61 Mastitis without abscess: Secondary | ICD-10-CM | POA: Insufficient documentation

## 2020-08-25 DIAGNOSIS — Z45811 Encounter for adjustment or removal of right breast implant: Secondary | ICD-10-CM | POA: Diagnosis not present

## 2020-08-25 DIAGNOSIS — Z9221 Personal history of antineoplastic chemotherapy: Secondary | ICD-10-CM | POA: Insufficient documentation

## 2020-08-25 DIAGNOSIS — C50411 Malignant neoplasm of upper-outer quadrant of right female breast: Secondary | ICD-10-CM | POA: Diagnosis not present

## 2020-08-25 DIAGNOSIS — Z9013 Acquired absence of bilateral breasts and nipples: Secondary | ICD-10-CM | POA: Diagnosis not present

## 2020-08-25 DIAGNOSIS — Z20822 Contact with and (suspected) exposure to covid-19: Secondary | ICD-10-CM | POA: Diagnosis not present

## 2020-08-25 DIAGNOSIS — Y838 Other surgical procedures as the cause of abnormal reaction of the patient, or of later complication, without mention of misadventure at the time of the procedure: Secondary | ICD-10-CM | POA: Insufficient documentation

## 2020-08-25 DIAGNOSIS — B957 Other staphylococcus as the cause of diseases classified elsewhere: Secondary | ICD-10-CM | POA: Diagnosis not present

## 2020-08-25 DIAGNOSIS — L03313 Cellulitis of chest wall: Secondary | ICD-10-CM | POA: Diagnosis not present

## 2020-08-25 HISTORY — PX: REMOVAL OF TISSUE EXPANDER AND PLACEMENT OF IMPLANT: SHX6457

## 2020-08-25 HISTORY — DX: Malignant (primary) neoplasm, unspecified: C80.1

## 2020-08-25 LAB — SARS CORONAVIRUS 2 BY RT PCR (HOSPITAL ORDER, PERFORMED IN ~~LOC~~ HOSPITAL LAB): SARS Coronavirus 2: NEGATIVE

## 2020-08-25 SURGERY — REMOVAL, TISSUE EXPANDER, BREAST, WITH IMPLANT INSERTION
Anesthesia: General | Site: Breast | Laterality: Bilateral

## 2020-08-25 MED ORDER — FENTANYL CITRATE (PF) 250 MCG/5ML IJ SOLN
INTRAMUSCULAR | Status: AC
  Filled 2020-08-25: qty 5

## 2020-08-25 MED ORDER — SUCCINYLCHOLINE CHLORIDE 200 MG/10ML IV SOSY
PREFILLED_SYRINGE | INTRAVENOUS | Status: AC
  Filled 2020-08-25: qty 20

## 2020-08-25 MED ORDER — ROCURONIUM BROMIDE 10 MG/ML (PF) SYRINGE
PREFILLED_SYRINGE | INTRAVENOUS | Status: DC | PRN
  Administered 2020-08-25: 40 mg via INTRAVENOUS

## 2020-08-25 MED ORDER — OXYCODONE HCL 5 MG/5ML PO SOLN: 5.0000 mg | Freq: Once | ORAL | Status: DC | PRN

## 2020-08-25 MED ORDER — IRRISEPT - 450ML BOTTLE WITH 0.05% CHG IN STERILE WATER, USP 99.95% OPTIME
TOPICAL | Status: DC | PRN
  Administered 2020-08-25: 450 mL

## 2020-08-25 MED ORDER — 0.9 % SODIUM CHLORIDE (POUR BTL) OPTIME
TOPICAL | Status: DC | PRN
  Administered 2020-08-25: 2000 mL

## 2020-08-25 MED ORDER — PHENYLEPHRINE HCL-NACL 10-0.9 MG/250ML-% IV SOLN
INTRAVENOUS | Status: DC | PRN
  Administered 2020-08-25: 25 ug/min via INTRAVENOUS

## 2020-08-25 MED ORDER — CHLORHEXIDINE GLUCONATE 0.12 % MT SOLN
OROMUCOSAL | Status: AC
  Administered 2020-08-25: 15 mL via OROMUCOSAL
  Filled 2020-08-25: qty 15

## 2020-08-25 MED ORDER — FENTANYL CITRATE (PF) 100 MCG/2ML IJ SOLN
INTRAMUSCULAR | Status: DC | PRN
  Administered 2020-08-25 (×2): 50 ug via INTRAVENOUS

## 2020-08-25 MED ORDER — BACITRACIN ZINC 500 UNIT/GM EX OINT
TOPICAL_OINTMENT | CUTANEOUS | Status: DC | PRN
  Administered 2020-08-25: 1 via TOPICAL

## 2020-08-25 MED ORDER — LIDOCAINE 2% (20 MG/ML) 5 ML SYRINGE
INTRAMUSCULAR | Status: DC | PRN
  Administered 2020-08-25: 30 mg via INTRAVENOUS

## 2020-08-25 MED ORDER — EPHEDRINE 5 MG/ML INJ
INTRAVENOUS | Status: AC
  Filled 2020-08-25: qty 10

## 2020-08-25 MED ORDER — DEXAMETHASONE SODIUM PHOSPHATE 10 MG/ML IJ SOLN
INTRAMUSCULAR | Status: AC
  Filled 2020-08-25: qty 5

## 2020-08-25 MED ORDER — MIDAZOLAM HCL 2 MG/2ML IJ SOLN
INTRAMUSCULAR | Status: AC
  Filled 2020-08-25: qty 2

## 2020-08-25 MED ORDER — PROPOFOL 10 MG/ML IV BOLUS
INTRAVENOUS | Status: AC
  Filled 2020-08-25: qty 40

## 2020-08-25 MED ORDER — BUPIVACAINE HCL (PF) 0.5 % IJ SOLN
INTRAMUSCULAR | Status: AC
  Filled 2020-08-25: qty 30

## 2020-08-25 MED ORDER — ONDANSETRON HCL 4 MG/2ML IJ SOLN
INTRAMUSCULAR | Status: DC | PRN
  Administered 2020-08-25: 4 mg via INTRAVENOUS

## 2020-08-25 MED ORDER — BUPIVACAINE HCL (PF) 0.5 % IJ SOLN
INTRAMUSCULAR | Status: DC | PRN
  Administered 2020-08-25: 27 mL

## 2020-08-25 MED ORDER — FENTANYL CITRATE (PF) 100 MCG/2ML IJ SOLN: 25.0000 ug | INTRAMUSCULAR | Status: DC | PRN

## 2020-08-25 MED ORDER — PHENYLEPHRINE 40 MCG/ML (10ML) SYRINGE FOR IV PUSH (FOR BLOOD PRESSURE SUPPORT)
PREFILLED_SYRINGE | INTRAVENOUS | Status: AC
  Filled 2020-08-25: qty 10

## 2020-08-25 MED ORDER — OXYCODONE HCL 5 MG PO TABS: 5.0000 mg | ORAL_TABLET | Freq: Once | ORAL | Status: DC | PRN

## 2020-08-25 MED ORDER — PHENYLEPHRINE 40 MCG/ML (10ML) SYRINGE FOR IV PUSH (FOR BLOOD PRESSURE SUPPORT)
PREFILLED_SYRINGE | INTRAVENOUS | Status: DC | PRN
  Administered 2020-08-25 (×2): 80 ug via INTRAVENOUS

## 2020-08-25 MED ORDER — LIDOCAINE 2% (20 MG/ML) 5 ML SYRINGE
INTRAMUSCULAR | Status: AC
  Filled 2020-08-25: qty 10

## 2020-08-25 MED ORDER — LACTATED RINGERS IV SOLN: INTRAVENOUS | Status: DC

## 2020-08-25 MED ORDER — ORAL CARE MOUTH RINSE: 15.0000 mL | Freq: Once | OROMUCOSAL | Status: AC

## 2020-08-25 MED ORDER — VANCOMYCIN HCL IN DEXTROSE 1-5 GM/200ML-% IV SOLN
1000.0000 mg | INTRAVENOUS | Status: AC
  Administered 2020-08-25: 1000 mg via INTRAVENOUS
  Filled 2020-08-25: qty 200

## 2020-08-25 MED ORDER — DOUBLE ANTIBIOTIC 500-10000 UNIT/GM EX OINT
TOPICAL_OINTMENT | CUTANEOUS | Status: AC
  Filled 2020-08-25: qty 56.8

## 2020-08-25 MED ORDER — ONDANSETRON HCL 4 MG/2ML IJ SOLN: 4.0000 mg | Freq: Once | INTRAMUSCULAR | Status: DC | PRN

## 2020-08-25 MED ORDER — METHOCARBAMOL 500 MG PO TABS: 500.0000 mg | ORAL_TABLET | Freq: Three times a day (TID) | ORAL | 0 refills | Status: DC | PRN

## 2020-08-25 MED ORDER — SUCCINYLCHOLINE CHLORIDE 200 MG/10ML IV SOSY
PREFILLED_SYRINGE | INTRAVENOUS | Status: DC | PRN
  Administered 2020-08-25: 120 mg via INTRAVENOUS

## 2020-08-25 MED ORDER — CHLORHEXIDINE GLUCONATE 0.12 % MT SOLN: 15.0000 mL | Freq: Once | OROMUCOSAL | Status: AC

## 2020-08-25 MED ORDER — ROCURONIUM BROMIDE 10 MG/ML (PF) SYRINGE
PREFILLED_SYRINGE | INTRAVENOUS | Status: AC
  Filled 2020-08-25: qty 10

## 2020-08-25 MED ORDER — DEXAMETHASONE SODIUM PHOSPHATE 10 MG/ML IJ SOLN
INTRAMUSCULAR | Status: DC | PRN
  Administered 2020-08-25: 10 mg via INTRAVENOUS

## 2020-08-25 MED ORDER — SUGAMMADEX SODIUM 200 MG/2ML IV SOLN
INTRAVENOUS | Status: DC | PRN
  Administered 2020-08-25: 150 mg via INTRAVENOUS

## 2020-08-25 MED ORDER — MIDAZOLAM HCL 5 MG/5ML IJ SOLN
INTRAMUSCULAR | Status: DC | PRN
  Administered 2020-08-25: 2 mg via INTRAVENOUS

## 2020-08-25 MED ORDER — PROPOFOL 10 MG/ML IV BOLUS
INTRAVENOUS | Status: DC | PRN
  Administered 2020-08-25: 150 mg via INTRAVENOUS

## 2020-08-25 MED ORDER — ONDANSETRON HCL 4 MG/2ML IJ SOLN
INTRAMUSCULAR | Status: AC
  Filled 2020-08-25: qty 12

## 2020-08-25 SURGICAL SUPPLY — 51 items
BINDER BREAST LRG (GAUZE/BANDAGES/DRESSINGS) IMPLANT
BINDER BREAST MEDIUM (GAUZE/BANDAGES/DRESSINGS) ×2 IMPLANT
CANISTER SUCT 3000ML PPV (MISCELLANEOUS) ×2 IMPLANT
CHLORAPREP W/TINT 26 (MISCELLANEOUS) ×2 IMPLANT
CLSR STERI-STRIP ANTIMIC 1/2X4 (GAUZE/BANDAGES/DRESSINGS) ×2 IMPLANT
CNTNR URN SCR LID CUP LEK RST (MISCELLANEOUS) ×1 IMPLANT
CONT SPEC 4OZ STRL OR WHT (MISCELLANEOUS) ×1
COVER SURGICAL LIGHT HANDLE (MISCELLANEOUS) ×2 IMPLANT
DERMABOND ADVANCED (GAUZE/BANDAGES/DRESSINGS)
DERMABOND ADVANCED .7 DNX12 (GAUZE/BANDAGES/DRESSINGS) IMPLANT
DRAIN CHANNEL 15F RND FF W/TCR (WOUND CARE) IMPLANT
DRAIN CHANNEL 19F RND (DRAIN) ×2 IMPLANT
DRAPE HALF SHEET 40X57 (DRAPES) ×4 IMPLANT
DRAPE ORTHO SPLIT 77X108 STRL (DRAPES) ×2
DRAPE SURG ORHT 6 SPLT 77X108 (DRAPES) ×2 IMPLANT
DRAPE WARM FLUID 44X44 (DRAPES) IMPLANT
DRSG PAD ABDOMINAL 8X10 ST (GAUZE/BANDAGES/DRESSINGS) ×4 IMPLANT
ELECT BLADE 4.0 EZ CLEAN MEGAD (MISCELLANEOUS)
ELECT COATED BLADE 2.86 ST (ELECTRODE) ×2 IMPLANT
ELECT REM PT RETURN 9FT ADLT (ELECTROSURGICAL) ×2
ELECTRODE BLDE 4.0 EZ CLN MEGD (MISCELLANEOUS) IMPLANT
ELECTRODE REM PT RTRN 9FT ADLT (ELECTROSURGICAL) ×1 IMPLANT
EVACUATOR SILICONE 100CC (DRAIN) ×2 IMPLANT
GAUZE SPONGE 4X4 12PLY STRL (GAUZE/BANDAGES/DRESSINGS) IMPLANT
GLOVE BIO SURGEON STRL SZ 6 (GLOVE) ×6 IMPLANT
GLOVE SURG SS PI 6.0 STRL IVOR (GLOVE) ×4 IMPLANT
GOWN STRL REUS W/ TWL LRG LVL3 (GOWN DISPOSABLE) ×2 IMPLANT
GOWN STRL REUS W/TWL LRG LVL3 (GOWN DISPOSABLE) ×2
KIT BASIN OR (CUSTOM PROCEDURE TRAY) ×2 IMPLANT
KIT TURNOVER KIT B (KITS) ×2 IMPLANT
NEEDLE HYPO 25GX1X1/2 BEV (NEEDLE) ×2 IMPLANT
NS IRRIG 1000ML POUR BTL (IV SOLUTION) ×4 IMPLANT
PACK GENERAL/GYN (CUSTOM PROCEDURE TRAY) ×2 IMPLANT
PAD ARMBOARD 7.5X6 YLW CONV (MISCELLANEOUS) ×2 IMPLANT
PIN SAFETY STERILE (MISCELLANEOUS) ×2 IMPLANT
SOL PREP POV-IOD 4OZ 10% (MISCELLANEOUS) IMPLANT
STAPLER VISISTAT 35W (STAPLE) IMPLANT
SUT CHROMIC 4 0 PS 2 18 (SUTURE) ×2 IMPLANT
SUT ETHILON 2 0 FS 18 (SUTURE) ×2 IMPLANT
SUT MNCRL AB 4-0 PS2 18 (SUTURE) IMPLANT
SUT MON AB 3-0 SH 27 (SUTURE) ×1
SUT MON AB 3-0 SH27 (SUTURE) ×1 IMPLANT
SUT VIC AB 0 CT2 27 (SUTURE) IMPLANT
SUT VIC AB 3-0 PS2 18 (SUTURE)
SUT VIC AB 3-0 PS2 18XBRD (SUTURE) IMPLANT
SUT VIC AB 4-0 PS2 27 (SUTURE) ×2 IMPLANT
SYR CONTROL 10ML LL (SYRINGE) ×2 IMPLANT
TOWEL GREEN STERILE (TOWEL DISPOSABLE) ×2 IMPLANT
TOWEL GREEN STERILE FF (TOWEL DISPOSABLE) ×2 IMPLANT
TRAY FOLEY MTR SLVR 14FR STAT (SET/KITS/TRAYS/PACK) IMPLANT
TUBE CONNECTING 12X1/4 (SUCTIONS) ×2 IMPLANT

## 2020-08-25 NOTE — Op Note (Signed)
Operative Note   DATE OF OPERATION: 10.10.21  LOCATION: [facility] Main OR-outpatient  SURGICAL DIVISION: Plastic Surgery  PREOPERATIVE DIAGNOSES:  1. Cellulitis right chest 2.Right breast cancer UOQ ER- 3. Neoadjuvant chemotherapy 4. Acquired absence bilateral breasts 5. Skin flap necrosis bilateral nipples  POSTOPERATIVE DIAGNOSES:  same  PROCEDURE:  1. Removal right chest tissue expander 2. Debridement skin bilateral mastectomy flap with closure total 2 cm  SURGEON: Irene Limbo MD MBA  ASSISTANT: none  ANESTHESIA:  General.   EBL: 20 ml  COMPLICATIONS: None immediate.   INDICATIONS FOR PROCEDURE:  The patient, Virginia [name], is a 54 y.o. female born on Jun 06, 1966, is here for removal right chest tissue expander. Patient is post operative bilateral nipple sparing mastectomies with expander acellular dermis reconstruction. Patient developed fever following drain removal with one day history right chest erythema. Plan removal expander right chest and debridement superficial eschars of bilateral nipples.   FINDINGS: Right chest with fluid collection cloudy yellow, cultures obtained. Expander and entirety acellular dermis removed. Superficial, not full thickness eschar right nipple tip and left lateral nipple. Primary closure completed both nipples.  DESCRIPTION OF PROCEDURE:  The patient's operative site was marked with the patient in the preoperative area. The patient was taken to the operating room. SCDs were placed and IV antibiotics were given. The patient's operative site was prepped and draped in a sterile fashion. A time out was performed and all information was confirmed to be correct. Incision made in right inframammary fold and carried through superficial fascia. Cloudy fluid encountered and culture obtained. Expander and entirety acellular dermis removed. Curettage performed over right mastectomy cavity. Cavity irrigated with saline Betadine solution followed by Irrisept solution.  70 Fr JP placed and secured to skin with 2-0 nylon. Closure incision completed with 3-0 monocryl in superficial fascia and dermis. Skin closure completed with 4-0 monocryl subcuticular. Over right nipple tangential debridement eschar completed to bleeding tissue. Closure completed with interrupted 4-0 monocryl in deeper tissue and skin closure completed with running 4-0 chromic, length 1 cm. Over left chest tangential debridement superficial nipple eschar completed. Simple closure completed with running 4-0 chromic suture, length 1 cm. Antibiotic ointment applied to bilateral nipple incisions. Steris applied to right inframammary fold incision. Dry dressing breast binder applied.  The patient was allowed to wake from anesthesia, extubated and taken to the recovery room in satisfactory condition.   SPECIMENS: right chest fluid for culture  DRAINS: 19 Fr JP in right subcutaneous chest

## 2020-08-25 NOTE — Anesthesia Preprocedure Evaluation (Addendum)
Anesthesia Evaluation   Patient awake    Reviewed: Allergy & Precautions, NPO status , Patient's Chart, lab work & pertinent test results  Airway Mallampati: II  TM Distance: >3 FB Neck ROM: Full    Dental  (+) Teeth Intact, Dental Advisory Given   Pulmonary    breath sounds clear to auscultation       Cardiovascular  Rhythm:Regular Rate:Normal     Neuro/Psych    GI/Hepatic   Endo/Other    Renal/GU      Musculoskeletal   Abdominal   Peds  Hematology   Anesthesia Other Findings   Reproductive/Obstetrics                             Anesthesia Physical Anesthesia Plan  ASA: II  Anesthesia Plan: General   Post-op Pain Management:    Induction: Intravenous, Cricoid pressure planned and Rapid sequence  PONV Risk Score and Plan: Ondansetron and Dexamethasone  Airway Management Planned: Oral ETT  Additional Equipment:   Intra-op Plan:   Post-operative Plan:   Informed Consent: I have reviewed the patients History and Physical, chart, labs and discussed the procedure including the risks, benefits and alternatives for the proposed anesthesia with the patient or authorized representative who has indicated his/her understanding and acceptance.     Dental advisory given  Plan Discussed with: CRNA and Anesthesiologist  Anesthesia Plan Comments: (Patient nauseated plan GA with oral ETT, rapid sequence induction)       Anesthesia Quick Evaluation

## 2020-08-25 NOTE — Transfer of Care (Signed)
Immediate Anesthesia Transfer of Care Note  Patient: Virginia [name]  Procedure(s) Performed: REMOVAL OF RIGHT BREAST TISSUE EXPANDER; DEBRIDEMENT BILATERAL MASTECTOMY FLAP NIPPLES (Bilateral Breast)  Patient Location: PACU  Anesthesia Type:General  Level of Consciousness: drowsy  Airway & Oxygen Therapy: Patient Spontanous Breathing and Patient connected to face mask oxygen  Post-op Assessment: Report given to RN and Post -op Vital signs reviewed and stable  Post vital signs: Reviewed and stable  Last Vitals:  Vitals Value Taken Time  BP 102/66 08/25/20 1015  Temp    Pulse 88 08/25/20 1016  Resp 17 08/25/20 1016  SpO2 100 % 08/25/20 1016  Vitals shown include unvalidated device data.  Last Pain:  Vitals:   08/25/20 0736  TempSrc: Oral  PainSc:       Patients Stated Pain Goal: 4 (23/53/61 4431)  Complications: No complications documented.

## 2020-08-25 NOTE — Anesthesia Postprocedure Evaluation (Signed)
Anesthesia Post Note  Patient: Virginia [name]  Procedure(s) Performed: REMOVAL OF RIGHT BREAST TISSUE EXPANDER; DEBRIDEMENT BILATERAL MASTECTOMY FLAP NIPPLES (Bilateral Breast)     Patient location during evaluation: PACU Anesthesia Type: General Level of consciousness: awake and alert Pain management: pain level controlled Vital Signs Assessment: post-procedure vital signs reviewed and stable Respiratory status: spontaneous breathing, nonlabored ventilation, respiratory function stable and patient connected to nasal cannula oxygen Cardiovascular status: blood pressure returned to baseline and stable Postop Assessment: no apparent nausea or vomiting Anesthetic complications: no   No complications documented.  Last Vitals:  Vitals:   08/25/20 1100 08/25/20 1115  BP: (!) 92/56 92/60  Pulse: 93 90  Resp: 16 17  Temp:    SpO2: 97% 95%    Last Pain:  Vitals:   08/25/20 1115  TempSrc:   PainSc: 0-No pain                 Ronnie Mallette COKER

## 2020-08-25 NOTE — Interval H&P Note (Signed)
History and Physical Interval Note:  08/25/2020 8:43 AM  Virginia [name]  has presented today for surgery, with the diagnosis of cellulitis right chest, breast cancer.  The various methods of treatment have been discussed with the patient and family. After consideration of risks, benefits and other options for treatment, the patient has consented to  Procedure(s): REMOVAL OF RIGHT BREAST TISSUE EXPANDER (Right) debridement bilateral mastectomy flap nipples as a surgical intervention.  The patient's history has been reviewed, patient examined, no change in status, stable for surgery.  I have reviewed the patient's chart and labs.  Questions were answered to the patient's satisfaction.     Arnoldo Hooker Brenon Antosh

## 2020-08-25 NOTE — Anesthesia Procedure Notes (Signed)
Procedure Name: Intubation Date/Time: 08/25/2020 9:01 AM Performed by: Candis Shine, CRNA Pre-anesthesia Checklist: Patient identified, Emergency Drugs available, Suction available and Patient being monitored Patient Re-evaluated:Patient Re-evaluated prior to induction Oxygen Delivery Method: Circle System Utilized Preoxygenation: Pre-oxygenation with 100% oxygen Induction Type: IV induction, Rapid sequence and Cricoid Pressure applied Laryngoscope Size: Mac and 3 Grade View: Grade I Tube type: Oral Tube size: 7.0 mm Number of attempts: 1 Airway Equipment and Method: Stylet Placement Confirmation: ETT inserted through vocal cords under direct vision,  positive ETCO2 and breath sounds checked- equal and bilateral Secured at: 21 cm Tube secured with: Tape Dental Injury: Teeth and Oropharynx as per pre-operative assessment

## 2020-08-26 ENCOUNTER — Encounter (HOSPITAL_COMMUNITY): Payer: Self-pay | Admitting: Plastic Surgery

## 2020-08-27 ENCOUNTER — Other Ambulatory Visit: Payer: Self-pay | Admitting: *Deleted

## 2020-08-27 LAB — AEROBIC CULTURE W GRAM STAIN (SUPERFICIAL SPECIMEN)

## 2020-08-27 LAB — SURGICAL PATHOLOGY

## 2020-08-27 NOTE — Patient Outreach (Signed)
[facility]) Care Management  08/27/2020  ILEAN SPRADLIN 1966-10-07 [id number]   Transition of care telephone call  Referral received:08/27/20 Initial outreach:08/27/20 Insurance: [facility]  Initial unsuccessful telephone call to patient's preferred number in order to complete transition of care assessment; no answer, left HIPAA compliant voicemail message requesting return call.   Objective: Per the electronic medical record, Mrs.Admire Bunnell   was hospitalized at [facility]  08/25/20  With/for Removal of Right breast tissue expander; debridement bilateral mastectomy flap nipples. Past medical history significant for : Right breast cancer, chemotherapy treatment,08/06/20 Bilateral nipple sparing mastectomies.  She  was discharged to home on 08/25/20  without the need for home health services or durable medical equipment per the discharge summary.   Plan: This RNCM will route unsuccessful outreach letter with [place] Management pamphlet and 24 hour Nurse Advice Line Magnet to [place] Management clinical pool to be mailed to patient's home address. This RNCM will attempt another outreach within 4 business days.  Joylene Draft, RN, BSN  [place] Management Coordinator  [phone]- Mobile [phone]- Toll Free Main Office

## 2020-08-29 ENCOUNTER — Other Ambulatory Visit (HOSPITAL_COMMUNITY): Payer: Self-pay | Admitting: Plastic Surgery

## 2020-08-29 MED FILL — AMOX-CLAV 875-125 MG TABLET: 875-125 | 3 days supply | Qty: 6 | Fill #0

## 2020-08-30 ENCOUNTER — Other Ambulatory Visit: Payer: Self-pay | Admitting: *Deleted

## 2020-08-30 ENCOUNTER — Encounter: Payer: Self-pay | Admitting: *Deleted

## 2020-08-30 LAB — ANAEROBIC CULTURE

## 2020-08-30 NOTE — Patient Outreach (Signed)
[facility]) Care Management  08/30/2020  Virginia [name] 07-Sep-1966 [id number]   Transition of care call/case closure   Referral received: 08/27/20 Initial outreach:08/27/20 Insurance: [employer]   Subjective: 2nd attempt  successful telephone call to patient's preferred number in order to complete transition of care assessment; 2 HIPAA identifiers verified. Explained purpose of call and completed transition of care assessment.  Virginia [name] states that she is doing a whole lot better. She denies post-operative problems, says surgical incisions are unremarkable,states surgical pain well managed with prescribed medications using over the counter ibuprofen. She reports visit with surgeon on yesterday drains have been removed at incision area.  She reports appetite is slow, she believes antibiotic may be have something to do with eat, discussed small frequent meals making sure to have snack around medication time. She , denies bowel or bladder problems.  Spouse is  assisting with her recovery.  She denies any ongoing health issues and says she does not need a referral to one of the [place] chronic disease management programs.  She does have  the hospital indemnity, verified that she has contact number to file a claim, she is currently on shortterm disability.  She uses a [facility] outpatient pharmacy at [facility] outpatient pharmacy.      Objective:  Mrs.Virginia [name]   was hospitalized at [facility]  08/25/20 for  Removal of Right breast tissue expander; debridement bilateral mastectomy flap nipples. Past medical history significant for : Right breast cancer, chemotherapy treatment,08/06/20 Bilateral nipple sparing mastectomies.  She  was discharged to home on 08/25/20  without the need for home health services or durable medical equipment per the discharge summary.  Assessment:  Patient voices good understanding of all discharge instructions.  See transition of  care flowsheet for assessment details.   Plan:  Reviewed hospital discharge diagnosis of Removal of right breast tissue expander debridement of bilateral mastectomy   and discharge treatment plan using hospital discharge instructions, assessing medication adherence, reviewing problems requiring provider notification, and discussing the importance of follow up with surgeon, primary care provider and/or specialists as directed.  Reviewed [place] healthy lifestyle program information to receive discounted premium for  2023   Step 1: Get  your annual physical  Step 2: Complete your health assessment  Step 3:Identify your current health status and complete the corresponding action step between January 1, and July 17, 2021.       No ongoing care management needs identified so will close case to [place] Management services and route successful outreach letter with [place] Management pamphlet and 24 Hour Nurse Line Magnet to [place] Management clinical pool to be mailed to patient's home address. Updated patient address in Epic per patient agreement.  Thanked patient for their services to [facility].  Joylene Draft, RN, BSN  [place] Management Coordinator  [phone]- Mobile [phone]- Toll Free Main Office

## 2020-09-09 ENCOUNTER — Other Ambulatory Visit: Payer: Self-pay | Admitting: Family Medicine

## 2020-09-09 MED FILL — OMEPRAZOLE DR 40 MG CAPSULE: 40 | 30 days supply | Qty: 30 | Fill #2

## 2020-09-09 MED FILL — ESCITALOPRAM 20 MG TABLET: 20 | 90 days supply | Qty: 90 | Fill #0

## 2020-09-11 DIAGNOSIS — C50411 Malignant neoplasm of upper-outer quadrant of right female breast: Secondary | ICD-10-CM | POA: Diagnosis not present

## 2020-09-11 DIAGNOSIS — Z9012 Acquired absence of left breast and nipple: Secondary | ICD-10-CM | POA: Diagnosis not present

## 2020-09-17 ENCOUNTER — Ambulatory Visit: Payer: 59 | Attending: Plastic Surgery

## 2020-09-17 ENCOUNTER — Other Ambulatory Visit: Payer: Self-pay

## 2020-09-17 DIAGNOSIS — M25511 Pain in right shoulder: Secondary | ICD-10-CM | POA: Insufficient documentation

## 2020-09-17 DIAGNOSIS — C50411 Malignant neoplasm of upper-outer quadrant of right female breast: Secondary | ICD-10-CM | POA: Diagnosis not present

## 2020-09-17 DIAGNOSIS — Z483 Aftercare following surgery for neoplasm: Secondary | ICD-10-CM | POA: Insufficient documentation

## 2020-09-17 DIAGNOSIS — M25611 Stiffness of right shoulder, not elsewhere classified: Secondary | ICD-10-CM | POA: Insufficient documentation

## 2020-09-17 DIAGNOSIS — Z9012 Acquired absence of left breast and nipple: Secondary | ICD-10-CM | POA: Diagnosis not present

## 2020-09-17 NOTE — Therapy (Signed)
[name], [place], [address] Phone: [phone]   Fax:  [phone]  Physical Therapy Evaluation  Patient Details  Name: Virginia [name] MRN: [id number] Date of Birth: 21-Feb-1966 Referring Provider (PT): Dr. Iran Planas   Encounter Date: 09/17/2020   PT End of Session - 09/17/20 1943    Visit Number 1    Number of Visits 13    Date for PT Re-Evaluation 10/29/20    PT Start Time 1610    PT Stop Time 1700    PT Time Calculation (min) 50 min    Activity Tolerance Patient tolerated treatment well;No increased pain    Behavior During Therapy WFL for tasks assessed/performed           Past Medical History:  Diagnosis Date  . Anorexia   . Anxiety   . Arthritis    knees  . Cancer [facility])    R Breast Cancer 2021  . Family history of breast cancer   . Family history of lung cancer   . Family history of pancreatic cancer   . Fibroids   . Wears glasses     Past Surgical History:  Procedure Laterality Date  . BREAST RECONSTRUCTION WITH PLACEMENT OF TISSUE EXPANDER AND ALLODERM Bilateral 08/06/2020   Procedure: BILATERAL BREAST RECONSTRUCTION WITH PLACEMENT OF TISSUE EXPANDER AND ALLODERM;  Surgeon: Irene Limbo, MD;  Location: [facility];  Service: Plastics;  Laterality: Bilateral;  . CLEFT PALATE REPAIR    . CLEFT PALATE REPAIR  1970  . COLONOSCOPY WITH PROPOFOL N/A 04/25/2018   Procedure: COLONOSCOPY WITH PROPOFOL;  Surgeon: [name], MD;  Location: [facility];  Service: Endoscopy;  Laterality: N/A;  . NIPPLE SPARING MASTECTOMY WITH SENTINEL LYMPH NODE BIOPSY Bilateral 08/06/2020   Procedure: BILATERAL NIPPLE SPARING MASTECTOMIES WITH RIGHT AXILLARY  SENTINEL LYMPH NODE MAPPING;  Surgeon: Erroll Luna, MD;  Location: [facility];  Service: General;  Laterality: Bilateral;  PECTORAL BLOCK  . PORT-A-CATH REMOVAL Right 08/06/2020   Procedure: REMOVAL  PORT-A-CATH;  Surgeon: Erroll Luna, MD;  Location: [facility];  Service: General;  Laterality: Right;  . PORTACATH PLACEMENT Right 02/22/2020   Procedure: INSERTION PORT-A-CATH WITH ULTRASOUND GUIDANCE;  Surgeon: Erroll Luna, MD;  Location: [facility];  Service: General;  Laterality: Right;  . REMOVAL OF TISSUE EXPANDER AND PLACEMENT OF IMPLANT Bilateral 08/25/2020   Procedure: REMOVAL OF RIGHT BREAST TISSUE EXPANDER; DEBRIDEMENT BILATERAL MASTECTOMY FLAP NIPPLES;  Surgeon: Irene Limbo, MD;  Location: [facility];  Service: Plastics;  Laterality: Bilateral;    There were no vitals filed for this visit.    Subjective Assessment - 09/17/20 1607    Subjective Pt is s/p bilateral mastectomies with right SLNB for neoplasm that was triple negative and she had adjuvant chemo.  She had intial surgery on 08/06/20 for Mastectomies with tissue expanders.  On 08/23/20 she developed swelling and redness in the right breast region and she underwent removal of right breast tissue expander.  She presents today with complaints of right axillary and pec tightness with limitations in shoulder ROM.  She does not feel she has any problems on the left.  Her incisions are healed.  She notes limitations with reaching, dressing and household activities. 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  Had SLNB and all 3 nodes were neg.  At  the time of her  mastectomies tissue expanders were placed.  She developed infection in the right and it was removed on 08/25/20/  She will have it replaced when it is safe to do so.    Limitations Lifting;House hold activities    Patient Stated Goals Return to PLOF, reaching , lifting, using arm    Currently in Pain? Yes    Pain Score 3     Pain Location Axilla    Pain Orientation Right    Pain Descriptors / Indicators Tightness;Discomfort    Pain Type Surgical pain    Pain Onset  1 to 4 weeks ago    Pain Frequency Intermittent    Pain Relieving Factors Not using arm to full extend=t    Effect of Pain on Daily Activities limits reaching, dressing, household activities              [facility] PT Assessment - 09/17/20 0001      Assessment   Medical Diagnosis S/P right triple negative breast CA    Referring Provider (PT) Dr. Iran Planas    Onset Date/Surgical Date 08/06/20    Hand Dominance Right    Prior Therapy no      Precautions   Precautions --   prior infection right breast     Restrictions   Weight Bearing Restrictions No      Balance Screen   Has the patient fallen in the past 6 months Yes    How many times? 1    Has the patient had a decrease in activity level because of a fear of falling?  No    Is the patient reluctant to leave their home because of a fear of falling?  No      Home Environment   Living Environment Private residence    Living Arrangements Spouse/significant other    Available Help at Discharge Family      Prior Function   Level of Independence Independent    Vocation Full time employment   works at [employer]   Overall Cognitive Status Within Functional Limits for tasks assessed      AROM   Right Shoulder Extension 42 Degrees    Right Shoulder Flexion 101 Degrees    Right Shoulder ABduction 75 Degrees    Right Shoulder Internal Rotation 49 Degrees    Right Shoulder External Rotation 88 Degrees    Left Shoulder Extension 47 Degrees    Left Shoulder Flexion 155 Degrees    Left Shoulder ABduction 159 Degrees    Left Shoulder Internal Rotation 62 Degrees    Left Shoulder External Rotation 87 Degrees             LYMPHEDEMA/ONCOLOGY QUESTIONNAIRE - 09/17/20 0001      Surgeries   Mastectomy Date 08/06/20 , 08/25/20 for removal of tissue expander on right   Number Lymph Nodes Removed 3      Treatment   Active Chemotherapy Treatment No    Past Chemotherapy Treatment  Yes    Active Radiation Treatment No    Past Radiation Treatment No      What other symptoms do you have   Are you Having Heaviness or Tightness Yes    Are you having Pain Yes    Are you having pitting edema No    Is it Hard or Difficult finding clothes that fit Yes    Do you have infections Yes    Comments cleared up    Is  there Decreased scar mobility Yes    Other Symptoms mild right upper chest swelling, ? left      Right Upper Extremity Lymphedema   At Axilla  30.8 cm    10 cm Proximal to Olecranon Process 29.1 cm    Olecranon Process 23.8 cm    15 cm Proximal to Ulnar Styloid Process 22.5 cm    Just Proximal to Ulnar Styloid Process 15 cm    Across Hand at [employer] 18.4 cm    At [place] of 2nd Digit 5.9 cm      Left Upper Extremity Lymphedema   At Axilla  29.1 cm    10 cm Proximal to Olecranon Process 28.2 cm    Olecranon Process 23.5 cm    15 cm Proximal to Ulnar Styloid Process 21.8 cm    Just Proximal to Ulnar Styloid Process 14.8 cm    Across Hand at [employer] 18 cm    At [address] of 2nd Digit 6 cm                 Quick Dash - 09/17/20 0001    Open a tight or new jar Moderate difficulty    Do heavy household chores (wash walls, wash floors) Severe difficulty    Carry a shopping bag or briefcase Mild difficulty    Wash your back Moderate difficulty    Use a knife to cut food No difficulty    Recreational activities in which you take some force or impact through your arm, shoulder, or hand (golf, hammering, tennis) Moderate difficulty    During the past week, to what extent has your arm, shoulder or hand problem interfered with your normal social activities with family, friends, neighbors, or groups? Modererately    During the past week, to what extent has your arm, shoulder or hand problem limited your work or other regular daily activities Modererately    Arm, shoulder, or hand pain. Moderate    Tingling (pins and needles) in your arm, shoulder, or  hand Mild    Difficulty Sleeping Mild difficulty    DASH Score 40.91 %            Objective measurements completed on examination: See above findings.               PT Education - 09/17/20 1942    Education Details Educated in post op exercises and ABC class    Person(s) Educated Patient    Methods Demonstration;Handout;Explanation    Comprehension Verbalized understanding;Returned demonstration            PT Short Term Goals - 09/17/20 1953      PT SHORT TERM GOAL #1   Title Pt. will attend ABC class virtually to learn exs and precautions for preventing lymphedema    Time 3    Period Weeks    Status New    Target Date 10/08/20             PT Long Term Goals - 09/17/20 1953      PT LONG TERM GOAL #1   Title Pt will have  AROM right shoulder flex and abd 120 deg for improved reaching ability    Baseline abd 75, flex 101    Time 3    Period Weeks    Status New      PT LONG TERM GOAL #2   Title Pt will have right shoulder AROM WNL without pain for improved household activities  Baseline flex 155 left, abd 159 left    Time 6    Period Weeks    Status New      PT LONG TERM GOAL #3   Title Pt will be able to dress without limitation    Time 4    Period Weeks    Status New      PT LONG TERM GOAL #4   Title Quick dash score will be no greater than 10%    Baseline 55%    Time 6    Period Weeks    Status New    Target Date 10/29/20                  Plan - 09/17/20 1944    Clinical Impression Statement Pt is s/p bilateral mastectomies with tissue expanders however right tissue expander needed to be removed on 08/25/20 secondary to infection.  She now presents with multiple areas of cording at the medial axilla radiating to the elbow, pain, limitations in ROM, and functional use of the right shoulder.  She has some very mild Right greater than left mild proximal breast swelling.  Multiple incisions healed, with 1 small scab present at right  nipple.    Personal Factors and Comorbidities Comorbidity 2    Comorbidities SLNB, right breast infection with removal of tissue expander    Examination-Activity Limitations Bathing;Reach Overhead;Carry;Dressing    Stability/Clinical Decision Making Stable/Uncomplicated    Clinical Decision Making Low    Rehab Potential Excellent    PT Frequency 2x / week    PT Duration 6 weeks    PT Treatment/Interventions Therapeutic exercise;Patient/family education;Neuromuscular re-education;Manual techniques;Manual lymph drainage;Passive range of motion;Taping    PT Next Visit Plan give TG soft for Right UE (forgotten today), Review exs and progress ROM, MFR to cording, MLD prn    PT Home Exercise Plan 4 post op exs and supine AA flex and stargazer stretch    Consulted and Agree with Plan of Care Patient           Patient will benefit from skilled therapeutic intervention in order to improve the following deficits and impairments:  Decreased range of motion, Impaired UE functional use, Pain, Decreased knowledge of precautions, Decreased activity tolerance, Impaired flexibility, Postural dysfunction, Decreased strength, Decreased mobility  Visit Diagnosis: Stiffness of right shoulder, not elsewhere classified  Acute pain of right shoulder  Aftercare following surgery for neoplasm     Problem List Patient Active Problem List   Diagnosis Date Noted  . Breast cancer, right ([place]) 08/06/2020  . Port-A-Cath in place 03/22/2020  . Genetic testing 02/26/2020  . Family history of pancreatic cancer   . Family history of breast cancer   . Family history of lung cancer   . Malignant neoplasm of upper-outer quadrant of right breast in female, estrogen receptor negative ([place]) 02/08/2020  . Special screening for malignant neoplasms, colon   . Disordered eating 09/30/2017  . Avitaminosis D 09/30/2017  . Family history of thyroid disease 09/30/2017  . Fibroid 08/31/2017  . Irregular bleeding  08/19/2017  . Arthralgia of hip 10/07/2015    Claris Pong, PT 09/17/2020, 7:58 PM  [place], [place], [address] Phone: [phone]   Fax:  [phone]  Name: Virginia [name] MRN: [id number] Date of Birth: October 19, 1966

## 2020-09-17 NOTE — Patient Instructions (Signed)
Patient was instructed today in a home exercise program today for post op shoulder range of motion. These included active assist shoulder flexion in sitting, scapular retraction, wall walking with shoulder abduction, and hands behind head external rotation.  She was also encouaged to du supine flexion AA ROM and Bilateral ER (star gazer stretch)She was encouraged to do these twice a day, holding 3 seconds and repeating 5 times.  Physical Therapy Information for After Breast Cancer Surgery/Treatment:   Lymphedema is a swelling condition that you may be at risk for in your arm if you have lymph nodes removed from the armpit area.  After a sentinel node biopsy, the risk is approximately 5-9% and is higher after an axillary node dissection.  There is treatment available for this condition and it is not life-threatening.  Contact your physician or physical therapist with concerns.  You may begin the 4 shoulder/posture exercises (see additional sheet) when permitted by your physician (typically a week after surgery).  If you have drains, you may need to wait until those are removed before beginning range of motion exercises.  A general recommendation is to not lift your arms above shoulder height until drains are removed.  These exercises should be done to your tolerance and gently.  This is not a "no pain/no gain" type of recovery so listen to your body and stretch into the range of motion that you can tolerate, stopping if you have pain.  If you are having immediate reconstruction, ask your plastic surgeon about doing exercises as he or she may want you to wait.  We encourage you to attend the free one time ABC (After Breast Cancer) class offered by [facility].  You will learn information related to lymphedema risk, prevention and treatment and additional exercises to regain mobility following surgery.  You can call [phone] for more information.  This is offered the 1st and 3rd  Monday of each month.  You only attend the class one time.  While undergoing any medical procedure or treatment, try to avoid blood pressure being taken or needle sticks from occurring on the arm on the side of cancer.   This recommendation begins after surgery and continues for the rest of your life.  This may help reduce your risk of getting lymphedema (swelling in your arm).  An excellent resource for those seeking information on lymphedema is the National Lymphedema Network's web site. It can be accessed at [facility].org  If you notice swelling in your hand, arm or breast at any time following surgery (even if it is many years from now), please contact your doctor or physical therapist to discuss this.  Lymphedema can be treated at any time but it is easier for you if it is treated early on.  If you feel like your shoulder motion is not returning to normal in a reasonable amount of time, please contact your surgeon or physical therapist.  [facility] [place], [place], [place] [phone]; [address], [place], [place] [address] ABC CLASS After Breast Cancer Class  After Breast Cancer Class is a specially designed exercise class to assist you in a safe recover after having breast cancer surgery.  In this class you will learn how to get back to full function whether your drains were just removed or if you had surgery a month ago.  This one-time class is held the 1st and 3rd Monday of every month from 11:00 a.m. until 12:00 noon and is presently being done virtually  This class is FREE and space is limited. For more information or to register for the next available class, call [phone].  Class Goals   Understand specific stretches to improve the flexibility of you chest and shoulder.  Learn ways to safely strengthen your upper body and improve your posture.  Understand the warning signs of infection and why you may be at risk for an arm infection.  Learn about Lymphedema and  prevention.

## 2020-09-19 ENCOUNTER — Other Ambulatory Visit: Payer: Self-pay

## 2020-09-19 ENCOUNTER — Ambulatory Visit: Payer: 59

## 2020-09-19 DIAGNOSIS — M25511 Pain in right shoulder: Secondary | ICD-10-CM | POA: Diagnosis not present

## 2020-09-19 DIAGNOSIS — M25611 Stiffness of right shoulder, not elsewhere classified: Secondary | ICD-10-CM

## 2020-09-19 DIAGNOSIS — Z483 Aftercare following surgery for neoplasm: Secondary | ICD-10-CM

## 2020-09-19 NOTE — Therapy (Signed)
[place], [place], [address] Phone: [phone]   Fax:  [phone]  Physical Therapy Treatment  Patient Details  Name: Virginia [name] MRN: [id number] Date of Birth: 12/05/1965 Referring Provider (PT): Dr. Iran Planas   Encounter Date: 09/19/2020   PT End of Session - 09/19/20 1645    Visit Number 2    Number of Visits 13    Date for PT Re-Evaluation 10/29/20    PT Start Time 0405    PT Stop Time 1740    PT Time Calculation (min) 50 min    Activity Tolerance Patient tolerated treatment well;No increased pain    Behavior During Therapy WFL for tasks assessed/performed           Past Medical History:  Diagnosis Date  . Anorexia   . Anxiety   . Arthritis    knees  . Cancer [facility])    R Breast Cancer 2021  . Family history of breast cancer   . Family history of lung cancer   . Family history of pancreatic cancer   . Fibroids   . Wears glasses     Past Surgical History:  Procedure Laterality Date  . BREAST RECONSTRUCTION WITH PLACEMENT OF TISSUE EXPANDER AND ALLODERM Bilateral 08/06/2020   Procedure: BILATERAL BREAST RECONSTRUCTION WITH PLACEMENT OF TISSUE EXPANDER AND ALLODERM;  Surgeon: Irene Limbo, MD;  Location: [place];  Service: Plastics;  Laterality: Bilateral;  . CLEFT PALATE REPAIR    . CLEFT PALATE REPAIR  1970  . COLONOSCOPY WITH PROPOFOL N/A 04/25/2018   Procedure: COLONOSCOPY WITH PROPOFOL;  Surgeon: [name], MD;  Location: [place];  Service: Endoscopy;  Laterality: N/A;  . NIPPLE SPARING MASTECTOMY WITH SENTINEL LYMPH NODE BIOPSY Bilateral 08/06/2020   Procedure: BILATERAL NIPPLE SPARING MASTECTOMIES WITH RIGHT AXILLARY  SENTINEL LYMPH NODE MAPPING;  Surgeon: Erroll Luna, MD;  Location: [place];  Service: General;  Laterality: Bilateral;  PECTORAL BLOCK  . PORT-A-CATH REMOVAL Right 08/06/2020   Procedure: REMOVAL  PORT-A-CATH;  Surgeon: Erroll Luna, MD;  Location: [place];  Service: General;  Laterality: Right;  . PORTACATH PLACEMENT Right 02/22/2020   Procedure: INSERTION PORT-A-CATH WITH ULTRASOUND GUIDANCE;  Surgeon: Erroll Luna, MD;  [facility];  Service: General;  Laterality: Right;  . REMOVAL OF TISSUE EXPANDER AND PLACEMENT OF IMPLANT Bilateral 08/25/2020   Procedure: REMOVAL OF RIGHT BREAST TISSUE EXPANDER; DEBRIDEMENT BILATERAL MASTECTOMY FLAP NIPPLES;  Surgeon: Irene Limbo, MD;  Location: [place];  Service: Plastics;  Laterality: Bilateral;    There were no vitals filed for this visit.   Subjective Assessment - 09/19/20 1605    Subjective You are going to be proud of me. I am doing great.  I can raise my arm much higher and dont feel the pulling as much.    Limitations Lifting;House hold activities    Patient Stated Goals Return to PLOF, reaching , lifting, using arm    Currently in Pain? No/denies    Pain Score 3     Pain Orientation Right    Pain Descriptors / Indicators Tightness    Pain Onset 1 to 4 weeks ago    Pain Frequency Intermittent                             [facility] Adult PT Treatment/Exercise - 09/19/20 0001      Shoulder Exercises: Pulleys   Flexion 2  minutes    Scaption 1 minute    ABduction 1 minute      Shoulder Exercises: Therapy Ball   Flexion Both;10 reps    ABduction Right;5 reps      Manual Therapy   Manual Therapy Myofascial release;Passive ROM    Edema Management Cut TG soft for right arm to decrease cording    Myofascial Release Right Medial arm /axilla to areas of cording and lats    Passive ROM PROM Right shoulder all planes                    PT Short Term Goals - 09/17/20 1953      PT SHORT TERM GOAL #1   Title Pt. will attend ABC class virtually to learn exs and precautions for preventing lymphedema    Time 3    Period Weeks    Status New    Target Date 10/08/20              PT Long Term Goals - 09/17/20 1953      PT LONG TERM GOAL #1   Title Pt will have  AROM right shoulder flex and abd 120 deg for improved reaching ability    Baseline abd 75, flex 101    Time 3    Period Weeks    Status New      PT LONG TERM GOAL #2   Title Pt will have right shoulder AROM WNL without pain for improved household activities    Baseline flex 155 left, abd 159 left    Time 6    Period Weeks    Status New      PT LONG TERM GOAL #3   Title Pt will be able to dress without limitation    Time 4    Period Weeks    Status New      PT LONG TERM GOAL #4   Title Quick dash score will be no greater than 10%    Baseline 55%    Time 6    Period Weeks    Status New    Target Date 10/29/20                 Plan - 09/19/20 1657    Clinical Impression Statement Pt is s/p bilateral tissue expanders with right removed on 08/25/20.  She continues with multiple areas of cording at the medial axilla, some radiating to the elbow.  After MFR techniques and PROM there was noted improved mobility and less tightness.  she is very compliant with a HEP and already had noted improvements from doing her HEP.  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    Personal Factors and Comorbidities Comorbidity 2    Comorbidities SLNB, right breast infection with removal of tissue expander    Examination-Activity Limitations Bathing;Reach Overhead;Carry;Dressing    Stability/Clinical Decision Making Stable/Uncomplicated    Rehab Potential Excellent    PT Frequency 2x / week    PT Duration 6 weeks    PT Treatment/Interventions Therapeutic exercise;Patient/family education;Neuromuscular re-education;Manual techniques;Manual lymph drainage;Passive range of motion;Taping    PT Next Visit Plan Cont MFR to cording, PROM, exs    PT Home Exercise Plan 4 post op exs and supine AA flex and stargazer stretch, TG soft    Consulted and Agree with Plan of Care  Patient           Patient will benefit from  skilled therapeutic intervention in order to improve the following deficits and impairments:  Decreased range of motion, Impaired UE functional use, Pain, Decreased knowledge of precautions, Decreased activity tolerance, Impaired flexibility, Postural dysfunction, Decreased strength, Decreased mobility  Visit Diagnosis: Stiffness of right shoulder, not elsewhere classified  Acute pain of right shoulder  Aftercare following surgery for neoplasm     Problem List Patient Active Problem List   Diagnosis Date Noted  . Breast cancer, right ([place]) 08/06/2020  . Port-A-Cath in place 03/22/2020  . Genetic testing 02/26/2020  . Family history of pancreatic cancer   . Family history of breast cancer   . Family history of lung cancer   . Malignant neoplasm of upper-outer quadrant of right breast in female, estrogen receptor negative ([place]) 02/08/2020  . Special screening for malignant neoplasms, colon   . Disordered eating 09/30/2017  . Avitaminosis D 09/30/2017  . Family history of thyroid disease 09/30/2017  . Fibroid 08/31/2017  . Irregular bleeding 08/19/2017  . Arthralgia of hip 10/07/2015    Elsie Ra [facility] 09/19/2020, 5:12 PM  [place], [place], [address] Phone: [phone]   Fax:  [phone]  Name: Virginia [name] MRN: [id number] Date of Birth: [birth year]

## 2020-10-01 ENCOUNTER — Other Ambulatory Visit: Payer: Self-pay

## 2020-10-01 ENCOUNTER — Ambulatory Visit: Payer: 59

## 2020-10-01 DIAGNOSIS — Z483 Aftercare following surgery for neoplasm: Secondary | ICD-10-CM

## 2020-10-01 DIAGNOSIS — M25611 Stiffness of right shoulder, not elsewhere classified: Secondary | ICD-10-CM

## 2020-10-01 DIAGNOSIS — M25511 Pain in right shoulder: Secondary | ICD-10-CM | POA: Diagnosis not present

## 2020-10-01 NOTE — Therapy (Signed)
[place], [place], [address] Phone: [phone]   Fax:  [phone]  Physical Therapy Treatment  Patient Details  Name: Virginia [name] MRN: [id number] Date of Birth: [birth year] Referring Provider (PT): Dr. Iran Planas   Encounter Date: 10/01/2020   PT End of Session - 10/01/20 1650    Visit Number 3    Number of Visits 13    Date for PT Re-Evaluation 10/29/20    PT Start Time 4481    PT Stop Time 8563    PT Time Calculation (min) 41 min    Activity Tolerance Patient tolerated treatment well;No increased pain    Behavior During Therapy WFL for tasks assessed/performed           Past Medical History:  Diagnosis Date  . Anorexia   . Anxiety   . Arthritis    knees  . Cancer [facility])    R Breast Cancer 2021  . Family history of breast cancer   . Family history of lung cancer   . Family history of pancreatic cancer   . Fibroids   . Wears glasses     Past Surgical History:  Procedure Laterality Date  . BREAST RECONSTRUCTION WITH PLACEMENT OF TISSUE EXPANDER AND ALLODERM Bilateral 08/06/2020   Procedure: BILATERAL BREAST RECONSTRUCTION WITH PLACEMENT OF TISSUE EXPANDER AND ALLODERM;  Surgeon: Irene Limbo, MD;  Location: [place];  Service: Plastics;  Laterality: Bilateral;  . CLEFT PALATE REPAIR    . CLEFT PALATE REPAIR  1970  . COLONOSCOPY WITH PROPOFOL N/A 04/25/2018   Procedure: COLONOSCOPY WITH PROPOFOL;  Surgeon: [name], MD;  Location: [place];  Service: Endoscopy;  Laterality: N/A;  . NIPPLE SPARING MASTECTOMY WITH SENTINEL LYMPH NODE BIOPSY Bilateral 08/06/2020   Procedure: BILATERAL NIPPLE SPARING MASTECTOMIES WITH RIGHT AXILLARY  SENTINEL LYMPH NODE MAPPING;  Surgeon: Erroll Luna, MD;  Location: [place];  Service: General;  Laterality: Bilateral;  PECTORAL BLOCK  . PORT-A-CATH REMOVAL Right 08/06/2020   Procedure: REMOVAL  PORT-A-CATH;  Surgeon: Erroll Luna, MD;  Location: [place];  Service: General;  Laterality: Right;  . PORTACATH PLACEMENT Right 02/22/2020   Procedure: INSERTION PORT-A-CATH WITH ULTRASOUND GUIDANCE;  Surgeon: Erroll Luna, MD;  [facility];  Service: General;  Laterality: Right;  . REMOVAL OF TISSUE EXPANDER AND PLACEMENT OF IMPLANT Bilateral 08/25/2020   Procedure: REMOVAL OF RIGHT BREAST TISSUE EXPANDER; DEBRIDEMENT BILATERAL MASTECTOMY FLAP NIPPLES;  Surgeon: Irene Limbo, MD;  Location: [place];  Service: Plastics;  Laterality: Bilateral;    There were no vitals filed for this visit.   Subjective Assessment - 10/01/20 1604    Subjective I am doing fantastic!  I have much more movement but that little cord is still there,  TG soft feels good and I wear it at night some when I get home..    Pertinent History Prior to bilateral mastectomies had neoadjuvant chemo, but did not have to have radiation.  Had SLNB and all 3 nodes were neg.  At  the time of her mastectomies tissue expanders were placed.  She developed infection in the right and it was removed on 08/25/20/  She will have it replaced when it is safe to do so.    Patient Stated Goals Return to PLOF, reaching , lifting, using arm    Currently in Pain? No/denies  [facility] Adult PT Treatment/Exercise - 10/01/20 0001      Shoulder Exercises: Pulleys   Flexion 2 minutes    ABduction 2 minutes      Shoulder Exercises: Therapy Ball   Flexion Both;10 reps    ABduction Right;5 reps      Manual Therapy   Manual Therapy Soft tissue mobilization;Myofascial release;Passive ROM    Soft tissue mobilization gentle scar massage to bilateral drain scars and right lateral mastectomy scar.  Pt instructed in gentle scar massat    Myofascial Release Right Medial arm /axilla to areas of cording and lats.  Pt instructed in [place] for her husband to try    Passive ROM PROM Right  shoulder all planes                    PT Short Term Goals - 09/17/20 1953      PT SHORT TERM GOAL #1   Title Pt. will attend ABC class virtually to learn exs and precautions for preventing lymphedema    Time 3    Period Weeks    Status New    Target Date 10/08/20             PT Long Term Goals - 09/17/20 1953      PT LONG TERM GOAL #1   Title Pt will have  AROM right shoulder flex and abd 120 deg for improved reaching ability    Baseline abd 75, flex 101    Time 3    Period Weeks    Status New      PT LONG TERM GOAL #2   Title Pt will have right shoulder AROM WNL without pain for improved household activities    Baseline flex 155 left, abd 159 left    Time 6    Period Weeks    Status New      PT LONG TERM GOAL #3   Title Pt will be able to dress without limitation    Time 4    Period Weeks    Status New      PT LONG TERM GOAL #4   Title Quick dash score will be no greater than 10%    Baseline 55%    Time 6    Period Weeks    Status New    Target Date 10/29/20                 Plan - 10/01/20 1651    Clinical Impression Statement Pt is s/p bilateral tissue expander siwht right removed on 08/25/20.  She continues to be compliant with HEP and right  shoulder ROM is greatly improved. She continues with multiple cords at right axilla some of which travel to the wrist.  They are bothersome, but not particularly painful except for a slight burn with stretching.    Comorbidities SLNB, right breast infection with removal of tissue expander    Examination-Activity Limitations Bathing;Reach Overhead;Carry;Dressing    Stability/Clinical Decision Making Stable/Uncomplicated    Clinical Decision Making Low    Rehab Potential Excellent    PT Frequency 2x / week    PT Duration 6 weeks    PT Treatment/Interventions Therapeutic exercise;Patient/family education;Neuromuscular re-education;Manual techniques;Manual lymph drainage;Passive range of motion;Taping     PT Next Visit Plan Cont MFR to cording, PROM, exs, review precautions    Consulted and Agree with Plan of Care Patient           Patient will benefit from skilled therapeutic intervention  in order to improve the following deficits and impairments:  Decreased range of motion, Impaired UE functional use, Pain, Decreased knowledge of precautions, Decreased activity tolerance, Impaired flexibility, Postural dysfunction, Decreased strength, Decreased mobility  Visit Diagnosis: Stiffness of right shoulder, not elsewhere classified  Acute pain of right shoulder  Aftercare following surgery for neoplasm     Problem List Patient Active Problem List   Diagnosis Date Noted  . Breast cancer, right ([place]) 08/06/2020  . Port-A-Cath in place 03/22/2020  . Genetic testing 02/26/2020  . Family history of pancreatic cancer   . Family history of breast cancer   . Family history of lung cancer   . Malignant neoplasm of upper-outer quadrant of right breast in female, estrogen receptor negative ([place]) 02/08/2020  . Special screening for malignant neoplasms, colon   . Disordered eating 09/30/2017  . Avitaminosis D 09/30/2017  . Family history of thyroid disease 09/30/2017  . Fibroid 08/31/2017  . Irregular bleeding 08/19/2017  . Arthralgia of hip 10/07/2015    Elsie Ra [facility] 10/01/2020, 4:58 PM  [place], [place], [address] Phone: [phone]   Fax:  [phone]  Name: NYESHIA MYSLIWIEC MRN: [id number] Date of Birth: 05-09-1966

## 2020-10-01 NOTE — Patient Instructions (Signed)

## 2020-10-02 ENCOUNTER — Other Ambulatory Visit: Payer: Self-pay | Admitting: *Deleted

## 2020-10-02 DIAGNOSIS — Z171 Estrogen receptor negative status [ER-]: Secondary | ICD-10-CM

## 2020-10-02 DIAGNOSIS — C50411 Malignant neoplasm of upper-outer quadrant of right female breast: Secondary | ICD-10-CM

## 2020-10-02 NOTE — Progress Notes (Signed)
Received call from pt with complaint of dizziness. Pt states she oral fluid intake is not as it should be and is experiencing lightlessness when standing.  Per MD pt dehydrated and needing to increase oral intake.  Pt also complaint of bilateral hand numbness with driving and sleeping.  Pt states numbness only occurs with those two activities.  Per MD pt may be experiencing carpal tunnel syndrome and needs to be evaluated by neurology.  Referral placed to [facility] and pt verbalized understanding.

## 2020-10-03 ENCOUNTER — Other Ambulatory Visit: Payer: Self-pay

## 2020-10-03 ENCOUNTER — Ambulatory Visit: Payer: 59

## 2020-10-03 DIAGNOSIS — M25511 Pain in right shoulder: Secondary | ICD-10-CM | POA: Diagnosis not present

## 2020-10-03 DIAGNOSIS — M25611 Stiffness of right shoulder, not elsewhere classified: Secondary | ICD-10-CM | POA: Diagnosis not present

## 2020-10-03 DIAGNOSIS — Z483 Aftercare following surgery for neoplasm: Secondary | ICD-10-CM | POA: Diagnosis not present

## 2020-10-03 NOTE — Therapy (Signed)
[place], [place], [address] Phone: [phone]   Fax:  [phone]  Physical Therapy Treatment  Patient Details  Name: Virginia [name] MRN: [id number] Date of Birth: 27-Mar-1966 Referring Provider (PT): Dr. Iran Planas   Encounter Date: 10/03/2020   PT End of Session - 10/03/20 1702    Visit Number 4    Number of Visits 13    Date for PT Re-Evaluation 10/29/20    PT Start Time 2951    PT Stop Time 8841    PT Time Calculation (min) 42 min    Activity Tolerance Patient tolerated treatment well;No increased pain    Behavior During Therapy WFL for tasks assessed/performed           Past Medical History:  Diagnosis Date  . Anorexia   . Anxiety   . Arthritis    knees  . Cancer [facility])    R Breast Cancer 2021  . Family history of breast cancer   . Family history of lung cancer   . Family history of pancreatic cancer   . Fibroids   . Wears glasses     Past Surgical History:  Procedure Laterality Date  . BREAST RECONSTRUCTION WITH PLACEMENT OF TISSUE EXPANDER AND ALLODERM Bilateral 08/06/2020   Procedure: BILATERAL BREAST RECONSTRUCTION WITH PLACEMENT OF TISSUE EXPANDER AND ALLODERM;  Surgeon: Irene Limbo, MD;  Location: [place];  Service: Plastics;  Laterality: Bilateral;  . CLEFT PALATE REPAIR    . CLEFT PALATE REPAIR  1970  . COLONOSCOPY WITH PROPOFOL N/A 04/25/2018   Procedure: COLONOSCOPY WITH PROPOFOL;  Surgeon: [name], MD;  Location: [place];  Service: Endoscopy;  Laterality: N/A;  . NIPPLE SPARING MASTECTOMY WITH SENTINEL LYMPH NODE BIOPSY Bilateral 08/06/2020   Procedure: BILATERAL NIPPLE SPARING MASTECTOMIES WITH RIGHT AXILLARY  SENTINEL LYMPH NODE MAPPING;  Surgeon: Erroll Luna, MD;  Location: [place];  Service: General;  Laterality: Bilateral;  PECTORAL BLOCK  . PORT-A-CATH REMOVAL Right 08/06/2020   Procedure: REMOVAL  PORT-A-CATH;  Surgeon: Erroll Luna, MD;  Location: [place];  Service: General;  Laterality: Right;  . PORTACATH PLACEMENT Right 02/22/2020   Procedure: INSERTION PORT-A-CATH WITH ULTRASOUND GUIDANCE;  Surgeon: Erroll Luna, MD;  [facility];  Service: General;  Laterality: Right;  . REMOVAL OF TISSUE EXPANDER AND PLACEMENT OF IMPLANT Bilateral 08/25/2020   Procedure: REMOVAL OF RIGHT BREAST TISSUE EXPANDER; DEBRIDEMENT BILATERAL MASTECTOMY FLAP NIPPLES;  Surgeon: Irene Limbo, MD;  Location: [place];  Service: Plastics;  Laterality: Bilateral;    There were no vitals filed for this visit.   Subjective Assessment - 10/03/20 1604    Subjective Did great after I was here last. Had some dizziness yesterday and I haven't been able to drink as much since chemo. they think I am dehydrated.  Trying hard to drink more but it doesn't taste good    Pertinent History Prior to bilateral mastectomies had neoadjuvant chemo, but did not have to have radiation.  Had SLNB and all 3 nodes were neg.  At  the time of her mastectomies tissue expanders were placed.  She developed infection in the right and it was removed on 08/25/20/  She will have it replaced when it is safe to do so.    Pain Onset 1 to 4 weeks ago    Pain Frequency Intermittent  [facility] Adult PT Treatment/Exercise - 10/03/20 0001      Shoulder Exercises: Pulleys   Flexion 2 minutes    ABduction 2 minutes      Shoulder Exercises: Therapy Ball   Flexion Both;10 reps    ABduction Right;5 reps      Manual Therapy   Manual Therapy Soft tissue mobilization;Myofascial release;Passive ROM    Soft tissue mobilization gentle scar massage to bilateral drain scars and right lateral mastectomy scar.  Pt instructed in gentle scar massat    Myofascial Release Right Medial arm /axilla to areas of cording and lats.  Pt instructed in [place] for her husband to try    Passive ROM PROM  Right shoulder all planes                    PT Short Term Goals - 09/17/20 1953      PT SHORT TERM GOAL #1   Title Pt. will attend ABC class virtually to learn exs and precautions for preventing lymphedema    Time 3    Period Weeks    Status New    Target Date 10/08/20             PT Long Term Goals - 09/17/20 1953      PT LONG TERM GOAL #1   Title Pt will have  AROM right shoulder flex and abd 120 deg for improved reaching ability    Baseline abd 75, flex 101    Time 3    Period Weeks    Status New      PT LONG TERM GOAL #2   Title Pt will have right shoulder AROM WNL without pain for improved household activities    Baseline flex 155 left, abd 159 left    Time 6    Period Weeks    Status New      PT LONG TERM GOAL #3   Title Pt will be able to dress without limitation    Time 4    Period Weeks    Status New      PT LONG TERM GOAL #4   Title Quick dash score will be no greater than 10%    Baseline 55%    Time 6    Period Weeks    Status New    Target Date 10/29/20                 Plan - 10/03/20 1703    Clinical Impression Statement Pt continues with multiple cords at right axilla which are minimally painful, but still bothersome to her.  Shoulder ROM is progressing nicely.  Right drain scar and lateral mastectomy incision slightly restricted.    Personal Factors and Comorbidities Comorbidity 2    Comorbidities SLNB, right breast infection with removal of tissue expander    Examination-Activity Limitations Bathing;Reach Overhead;Carry;Dressing    Stability/Clinical Decision Making Stable/Uncomplicated    Rehab Potential Excellent    PT Frequency 2x / week    PT Duration 6 weeks    PT Treatment/Interventions Therapeutic exercise;Patient/family education;Neuromuscular re-education;Manual techniques;Manual lymph drainage;Passive range of motion;Taping    PT Next Visit Plan Cont MFR and techniques to cording,PROM, smaller TG soft or  compression sleeve for cording    PT Home Exercise Plan 4 post op exs and supine AA flex and stargazer stretch, TG soft    Consulted and Agree with Plan of Care Patient           Patient will benefit  from skilled therapeutic intervention in order to improve the following deficits and impairments:  Decreased range of motion, Impaired UE functional use, Pain, Decreased knowledge of precautions, Decreased activity tolerance, Impaired flexibility, Postural dysfunction, Decreased strength, Decreased mobility  Visit Diagnosis: Acute pain of right shoulder  Stiffness of right shoulder, not elsewhere classified  Aftercare following surgery for neoplasm     Problem List Patient Active Problem List   Diagnosis Date Noted  . Breast cancer, right ([place]) 08/06/2020  . Port-A-Cath in place 03/22/2020  . Genetic testing 02/26/2020  . Family history of pancreatic cancer   . Family history of breast cancer   . Family history of lung cancer   . Malignant neoplasm of upper-outer quadrant of right breast in female, estrogen receptor negative ([place]) 02/08/2020  . Special screening for malignant neoplasms, colon   . Disordered eating 09/30/2017  . Avitaminosis D 09/30/2017  . Family history of thyroid disease 09/30/2017  . Fibroid 08/31/2017  . Irregular bleeding 08/19/2017  . Arthralgia of hip 10/07/2015    Virginia [name] 10/03/2020, 5:07 PM  [place], [place], [address] Phone: [phone]   Fax:  [phone]  Name: Virginia [name] MRN: [id number] Date of Birth: [birth year] Cheral Almas, PT 10/03/20 5:08 PM

## 2020-10-08 ENCOUNTER — Other Ambulatory Visit: Payer: Self-pay

## 2020-10-08 ENCOUNTER — Ambulatory Visit: Payer: 59

## 2020-10-08 DIAGNOSIS — M25511 Pain in right shoulder: Secondary | ICD-10-CM

## 2020-10-08 DIAGNOSIS — Z483 Aftercare following surgery for neoplasm: Secondary | ICD-10-CM

## 2020-10-08 DIAGNOSIS — M25611 Stiffness of right shoulder, not elsewhere classified: Secondary | ICD-10-CM | POA: Diagnosis not present

## 2020-10-08 NOTE — Therapy (Signed)
[place], [place], [address] Phone: [phone]   Fax:  [phone]  Physical Therapy Treatment  Patient Details  Name: Virginia [name] MRN: [id number] Date of Birth: 1966-01-30 Referring Provider (PT): Dr. Iran Planas   Encounter Date: 10/08/2020   PT End of Session - 10/08/20 1701    Visit Number 5    Number of Visits 13    Date for PT Re-Evaluation 10/29/20    PT Start Time 3557    PT Stop Time 1700    PT Time Calculation (min) 52 min    Activity Tolerance Patient tolerated treatment well;No increased pain    Behavior During Therapy WFL for tasks assessed/performed           Past Medical History:  Diagnosis Date  . Anorexia   . Anxiety   . Arthritis    knees  . Cancer [facility])    R Breast Cancer 2021  . Family history of breast cancer   . Family history of lung cancer   . Family history of pancreatic cancer   . Fibroids   . Wears glasses     Past Surgical History:  Procedure Laterality Date  . BREAST RECONSTRUCTION WITH PLACEMENT OF TISSUE EXPANDER AND ALLODERM Bilateral 08/06/2020   Procedure: BILATERAL BREAST RECONSTRUCTION WITH PLACEMENT OF TISSUE EXPANDER AND ALLODERM;  Surgeon: Irene Limbo, MD;  Location: [place];  Service: Plastics;  Laterality: Bilateral;  . CLEFT PALATE REPAIR    . CLEFT PALATE REPAIR  1970  . COLONOSCOPY WITH PROPOFOL N/A 04/25/2018   Procedure: COLONOSCOPY WITH PROPOFOL;  Surgeon: [name], MD;  Location: [place];  Service: Endoscopy;  Laterality: N/A;  . NIPPLE SPARING MASTECTOMY WITH SENTINEL LYMPH NODE BIOPSY Bilateral 08/06/2020   Procedure: BILATERAL NIPPLE SPARING MASTECTOMIES WITH RIGHT AXILLARY  SENTINEL LYMPH NODE MAPPING;  Surgeon: Erroll Luna, MD;  Location: [place];  Service: General;  Laterality: Bilateral;  PECTORAL BLOCK  . PORT-A-CATH REMOVAL Right 08/06/2020   Procedure: REMOVAL  PORT-A-CATH;  Surgeon: Erroll Luna, MD;  Location: [place];  Service: General;  Laterality: Right;  . PORTACATH PLACEMENT Right 02/22/2020   Procedure: INSERTION PORT-A-CATH WITH ULTRASOUND GUIDANCE;  Surgeon: Erroll Luna, MD;  [facility];  Service: General;  Laterality: Right;  . REMOVAL OF TISSUE EXPANDER AND PLACEMENT OF IMPLANT Bilateral 08/25/2020   Procedure: REMOVAL OF RIGHT BREAST TISSUE EXPANDER; DEBRIDEMENT BILATERAL MASTECTOMY FLAP NIPPLES;  Surgeon: Irene Limbo, MD;  Location: [place];  Service: Plastics;  Laterality: Bilateral;    There were no vitals filed for this visit.   Subjective Assessment - 10/08/20 1608    Subjective Dizziness is better because I have been drinking more water. Still working on the cording but they are still there. No longer having pain, but the cords are still there    Pertinent History Prior to bilateral mastectomies had neoadjuvant chemo, but did not have to have radiation.  Had SLNB and all 3 nodes were neg.  At  the time of her mastectomies tissue expanders were placed.  She developed infection in the right and it was removed on 08/25/20/  She will have it replaced when it is safe to do so.    Limitations Lifting;House hold activities    Patient Stated Goals Return to PLOF, reaching , lifting, using arm    Pain Score 0-No pain  [facility] Adult PT Treatment/Exercise - 10/08/20 0001      Shoulder Exercises: Therapy Ball   Flexion Both;10 reps    ABduction Right;5 reps      Manual Therapy   Manual Therapy Soft tissue mobilization;Myofascial release;Passive ROM    Soft tissue mobilization scar massage to inferior clavicle scar    Myofascial Release Right medial arm/elbow to areas of cording    Passive ROM PROM right shoulder with MFR to decrease cording                    PT Short Term Goals - 09/17/20 1953      PT SHORT TERM GOAL #1   Title Pt. will attend  ABC class virtually to learn exs and precautions for preventing lymphedema    Time 3    Period Weeks    Status New    Target Date 10/08/20             PT Long Term Goals - 09/17/20 1953      PT LONG TERM GOAL #1   Title Pt will have  AROM right shoulder flex and abd 120 deg for improved reaching ability    Baseline abd 75, flex 101    Time 3    Period Weeks    Status New      PT LONG TERM GOAL #2   Title Pt will have right shoulder AROM WNL without pain for improved household activities    Baseline flex 155 left, abd 159 left    Time 6    Period Weeks    Status New      PT LONG TERM GOAL #3   Title Pt will be able to dress without limitation    Time 4    Period Weeks    Status New      PT LONG TERM GOAL #4   Title Quick dash score will be no greater than 10%    Baseline 55%    Time 6    Period Weeks    Status New    Target Date 10/29/20                 Plan - 10/08/20 1702    Clinical Impression Statement Pt no longer has complaints of pain in right shoulder/axilla or area of cording with stretching, and she is now very functional, however multiple areas of cording are still present.    Personal Factors and Comorbidities Comorbidity 2    Comorbidities SLNB, right breast infection with removal of tissue expander    Examination-Activity Limitations Bathing;Reach Overhead;Carry;Dressing    Stability/Clinical Decision Making Stable/Uncomplicated    Rehab Potential Excellent    PT Frequency 2x / week    PT Duration 6 weeks    PT Treatment/Interventions Therapeutic exercise;Patient/family education;Neuromuscular re-education;Manual techniques;Manual lymph drainage;Passive range of motion;Taping    PT Next Visit Plan Cont MFR and techniques to cording,PROM, smaller TG soft or compression sleeve for cording?    PT Home Exercise Plan 4 post op exs and supine AA flex and stargazer stretch, TG soft    Consulted and Agree with Plan of Care Patient            Patient will benefit from skilled therapeutic intervention in order to improve the following deficits and impairments:  Decreased range of motion, Impaired UE functional use, Pain, Decreased knowledge of precautions, Decreased activity tolerance, Impaired flexibility, Postural dysfunction, Decreased strength, Decreased mobility  Visit Diagnosis: Stiffness of right shoulder,  not elsewhere classified  Acute pain of right shoulder  Aftercare following surgery for neoplasm     Problem List Patient Active Problem List   Diagnosis Date Noted  . Breast cancer, right ([place]) 08/06/2020  . Port-A-Cath in place 03/22/2020  . Genetic testing 02/26/2020  . Family history of pancreatic cancer   . Family history of breast cancer   . Family history of lung cancer   . Malignant neoplasm of upper-outer quadrant of right breast in female, estrogen receptor negative ([place]) 02/08/2020  . Special screening for malignant neoplasms, colon   . Disordered eating 09/30/2017  . Avitaminosis D 09/30/2017  . Family history of thyroid disease 09/30/2017  . Fibroid 08/31/2017  . Irregular bleeding 08/19/2017  . Arthralgia of hip 10/07/2015    Claris Pong 10/08/2020, 5:07 PM  [place], [place], [address] Phone: [phone]   Fax:  [phone]  Name: ADDALYNE VANDEHEI MRN: [id number] Date of Birth: 1966-04-18  Cheral Almas, PT 10/08/20 5:10 PM

## 2020-10-15 ENCOUNTER — Ambulatory Visit: Payer: 59

## 2020-10-15 ENCOUNTER — Other Ambulatory Visit: Payer: Self-pay

## 2020-10-15 DIAGNOSIS — M25511 Pain in right shoulder: Secondary | ICD-10-CM | POA: Diagnosis not present

## 2020-10-15 DIAGNOSIS — M25611 Stiffness of right shoulder, not elsewhere classified: Secondary | ICD-10-CM | POA: Diagnosis not present

## 2020-10-15 DIAGNOSIS — Z483 Aftercare following surgery for neoplasm: Secondary | ICD-10-CM

## 2020-10-15 NOTE — Patient Instructions (Signed)
Access Code: [email or web address] URL: [email or web address] Date: 10/15/2020 Prepared by: Cheral Almas  Exercises Scapular Retraction with Resistance Advanced - 1 x daily - 3 x weekly - 1 sets - 10 reps Shoulder External Rotation and Scapular Retraction with Resistance - 1 x daily - 1 sets - 10 reps Scapular Retraction with Resistance - 1 x daily - 3 x weekly - 1 sets - 10 reps

## 2020-10-15 NOTE — Therapy (Signed)
[place], [place], [address] Phone: [phone]   Fax:  [phone]  Physical Therapy Treatment  Patient Details  Name: Virginia [name] MRN: [id number] Date of Birth: 08/04/1966 Referring Provider (PT): Dr. Iran Planas   Encounter Date: 10/15/2020   PT End of Session - 10/15/20 1715    Visit Number 6    Number of Visits 13    Date for PT Re-Evaluation 10/29/20    PT Start Time 9233    PT Stop Time 1702    PT Time Calculation (min) 55 min    Activity Tolerance Patient tolerated treatment well;No increased pain    Behavior During Therapy WFL for tasks assessed/performed           Past Medical History:  Diagnosis Date  . Anorexia   . Anxiety   . Arthritis    knees  . Cancer [facility])    R Breast Cancer 2021  . Family history of breast cancer   . Family history of lung cancer   . Family history of pancreatic cancer   . Fibroids   . Wears glasses     Past Surgical History:  Procedure Laterality Date  . BREAST RECONSTRUCTION WITH PLACEMENT OF TISSUE EXPANDER AND ALLODERM Bilateral 08/06/2020   Procedure: BILATERAL BREAST RECONSTRUCTION WITH PLACEMENT OF TISSUE EXPANDER AND ALLODERM;  Surgeon: Irene Limbo, MD;  Location: [place];  Service: Plastics;  Laterality: Bilateral;  . CLEFT PALATE REPAIR    . CLEFT PALATE REPAIR  1970  . COLONOSCOPY WITH PROPOFOL N/A 04/25/2018   Procedure: COLONOSCOPY WITH PROPOFOL;  Surgeon: [name], MD;  Location: [place];  Service: Endoscopy;  Laterality: N/A;  . NIPPLE SPARING MASTECTOMY WITH SENTINEL LYMPH NODE BIOPSY Bilateral 08/06/2020   Procedure: BILATERAL NIPPLE SPARING MASTECTOMIES WITH RIGHT AXILLARY  SENTINEL LYMPH NODE MAPPING;  Surgeon: Erroll Luna, MD;  Location: [place];  Service: General;  Laterality: Bilateral;  PECTORAL BLOCK  . PORT-A-CATH REMOVAL Right 08/06/2020   Procedure: REMOVAL  PORT-A-CATH;  Surgeon: Erroll Luna, MD;  Location: [place];  Service: General;  Laterality: Right;  . PORTACATH PLACEMENT Right 02/22/2020   Procedure: INSERTION PORT-A-CATH WITH ULTRASOUND GUIDANCE;  Surgeon: Erroll Luna, MD;  [facility];  Service: General;  Laterality: Right;  . REMOVAL OF TISSUE EXPANDER AND PLACEMENT OF IMPLANT Bilateral 08/25/2020   Procedure: REMOVAL OF RIGHT BREAST TISSUE EXPANDER; DEBRIDEMENT BILATERAL MASTECTOMY FLAP NIPPLES;  Surgeon: Irene Limbo, MD;  Location: [place];  Service: Plastics;  Laterality: Bilateral;    There were no vitals filed for this visit.                      [facility] Adult PT Treatment/Exercise - 10/15/20 0001      Shoulder Exercises: Standing   Extension 10 reps;Strengthening;Both    Theraband Level (Shoulder Extension) Level 1 (Yellow)    Retraction Strengthening;Both;10 reps    Theraband Level (Shoulder Retraction) Level 1 (Yellow)    Other Standing Exercises serratus on wall x 15      Shoulder Exercises: Therapy Ball   Flexion Both;10 reps    ABduction Right;5 reps    Other Therapy Ball Exercises ball stabs x 15 2 D on right      Manual Therapy   Manual therapy comments very mild heat to area of cording 5 min with long cervical thermapore pack     Myofascial Release Right medial arm/elbow to areas  of cording    Passive ROM PROM right shoulder with MFR to decrease cording                  PT Education - 10/15/20 1714    Education Details Pt was educated in postural exercises including Scap retraction, shoulder ext and bilateral ER with yellow band and was given illustrated and written instructions    Person(s) Educated Patient    Methods Explanation;Demonstration;Handout    Comprehension Verbalized understanding;Returned demonstration            PT Short Term Goals - 09/17/20 1953      PT SHORT TERM GOAL #1   Title Pt. will attend ABC class virtually to learn exs and  precautions for preventing lymphedema    Time 3    Period Weeks    Status New    Target Date 10/08/20             PT Long Term Goals - 09/17/20 1953      PT LONG TERM GOAL #1   Title Pt will have  AROM right shoulder flex and abd 120 deg for improved reaching ability    Baseline abd 75, flex 101    Time 3    Period Weeks    Status New      PT LONG TERM GOAL #2   Title Pt will have right shoulder AROM WNL without pain for improved household activities    Baseline flex 155 left, abd 159 left    Time 6    Period Weeks    Status New      PT LONG TERM GOAL #3   Title Pt will be able to dress without limitation    Time 4    Period Weeks    Status New      PT LONG TERM GOAL #4   Title Quick dash score will be no greater than 10%    Baseline 55%    Time 6    Period Weeks    Status New    Target Date 10/29/20                 Plan - 10/15/20 1716    Clinical Impression Statement Very mild heat for approx 5 min was applied to right arm from axilla to forearm prior to release techniques with narrow thermapore well padded for gentle warmth. Therapy consisted of MFR techniques to area of cording, and during stretching.  Pt also performed exercises in clinic.  ROM is not limited by cording but multiple areas of cording are still present. Area of cording appeared mildly reduced after moist heat, but so visible changes after exercises.  1 small pop was felt with ROM   Personal Factors and Comorbidities Comorbidity 2    Comorbidities SLNB, right breast infection with removal of tissue expander    Examination-Activity Limitations Bathing;Reach Overhead;Carry;Dressing    Stability/Clinical Decision Making Stable/Uncomplicated    Clinical Decision Making Low    Rehab Potential Excellent    PT Frequency 2x / week    PT Duration 6 weeks    PT Treatment/Interventions Therapeutic exercise;Patient/family education;Neuromuscular re-education;Manual techniques;Manual lymph  drainage;Passive range of motion;Taping    PT Next Visit Plan Sent face sheet to liz to check benefits for prophylactic sleeve and gauntlet, Cont MFR, PROM, exercises    PT Home Exercise Plan 4 post op exs and supine AA flex and stargazer stretch, TG soft, TB exercises    Recommended Other Services compression  sleeve prophylactic    Consulted and Agree with Plan of Care Patient           Patient will benefit from skilled therapeutic intervention in order to improve the following deficits and impairments:  Decreased range of motion, Impaired UE functional use, Pain, Decreased knowledge of precautions, Decreased activity tolerance, Impaired flexibility, Postural dysfunction, Decreased strength, Decreased mobility  Visit Diagnosis: Acute pain of right shoulder  Aftercare following surgery for neoplasm     Problem List Patient Active Problem List   Diagnosis Date Noted  . Breast cancer, right ([place]) 08/06/2020  . Port-A-Cath in place 03/22/2020  . Genetic testing 02/26/2020  . Family history of pancreatic cancer   . Family history of breast cancer   . Family history of lung cancer   . Malignant neoplasm of upper-outer quadrant of right breast in female, estrogen receptor negative ([place]) 02/08/2020  . Special screening for malignant neoplasms, colon   . Disordered eating 09/30/2017  . Avitaminosis D 09/30/2017  . Family history of thyroid disease 09/30/2017  . Fibroid 08/31/2017  . Irregular bleeding 08/19/2017  . Arthralgia of hip 10/07/2015    Claris Pong 10/15/2020, 5:28 PM  [place], [place], [address] Phone: [phone]   Fax:  [phone]  Name: AERIAL DILLEY MRN: [id number] Date of Birth: [birth year]  Cheral Almas, PT 10/15/20 5:30 PM

## 2020-10-17 ENCOUNTER — Ambulatory Visit: Payer: 59 | Attending: Plastic Surgery

## 2020-10-17 ENCOUNTER — Other Ambulatory Visit: Payer: Self-pay

## 2020-10-17 DIAGNOSIS — Z483 Aftercare following surgery for neoplasm: Secondary | ICD-10-CM | POA: Insufficient documentation

## 2020-10-17 DIAGNOSIS — M25511 Pain in right shoulder: Secondary | ICD-10-CM | POA: Diagnosis not present

## 2020-10-17 DIAGNOSIS — M25611 Stiffness of right shoulder, not elsewhere classified: Secondary | ICD-10-CM | POA: Insufficient documentation

## 2020-10-17 DIAGNOSIS — Z170 Estrogen receptor positive status [ER+]: Secondary | ICD-10-CM | POA: Insufficient documentation

## 2020-10-17 DIAGNOSIS — C50411 Malignant neoplasm of upper-outer quadrant of right female breast: Secondary | ICD-10-CM | POA: Diagnosis not present

## 2020-10-17 NOTE — Therapy (Signed)
[place], [place], [address] Phone: [phone]   Fax:  [phone]  Physical Therapy Treatment  Patient Details  Name: Virginia [name] MRN: [id number] Date of Birth: 05/25/1966 Referring Provider (PT): Dr. Iran Planas   Encounter Date: 10/17/2020   PT End of Session - 10/17/20 1639    Visit Number 7    Number of Visits 13    Date for PT Re-Evaluation 10/29/20    PT Start Time 2703    PT Stop Time 5009    PT Time Calculation (min) 30 min    Activity Tolerance Patient tolerated treatment well;No increased pain    Behavior During Therapy WFL for tasks assessed/performed           Past Medical History:  Diagnosis Date  . Anorexia   . Anxiety   . Arthritis    knees  . Cancer [facility])    R Breast Cancer 2021  . Family history of breast cancer   . Family history of lung cancer   . Family history of pancreatic cancer   . Fibroids   . Wears glasses     Past Surgical History:  Procedure Laterality Date  . BREAST RECONSTRUCTION WITH PLACEMENT OF TISSUE EXPANDER AND ALLODERM Bilateral 08/06/2020   Procedure: BILATERAL BREAST RECONSTRUCTION WITH PLACEMENT OF TISSUE EXPANDER AND ALLODERM;  Surgeon: Irene Limbo, MD;  Location: [place];  Service: Plastics;  Laterality: Bilateral;  . CLEFT PALATE REPAIR    . CLEFT PALATE REPAIR  1970  . COLONOSCOPY WITH PROPOFOL N/A 04/25/2018   Procedure: COLONOSCOPY WITH PROPOFOL;  Surgeon: [name], MD;  Location: [place];  Service: Endoscopy;  Laterality: N/A;  . NIPPLE SPARING MASTECTOMY WITH SENTINEL LYMPH NODE BIOPSY Bilateral 08/06/2020   Procedure: BILATERAL NIPPLE SPARING MASTECTOMIES WITH RIGHT AXILLARY  SENTINEL LYMPH NODE MAPPING;  Surgeon: Erroll Luna, MD;  Location: [place];  Service: General;  Laterality: Bilateral;  PECTORAL BLOCK  . PORT-A-CATH REMOVAL Right 08/06/2020   Procedure: REMOVAL  PORT-A-CATH;  Surgeon: Erroll Luna, MD;  Location: [place];  Service: General;  Laterality: Right;  . PORTACATH PLACEMENT Right 02/22/2020   Procedure: INSERTION PORT-A-CATH WITH ULTRASOUND GUIDANCE;  Surgeon: Erroll Luna, MD;  [facility];  Service: General;  Laterality: Right;  . REMOVAL OF TISSUE EXPANDER AND PLACEMENT OF IMPLANT Bilateral 08/25/2020   Procedure: REMOVAL OF RIGHT BREAST TISSUE EXPANDER; DEBRIDEMENT BILATERAL MASTECTOMY FLAP NIPPLES;  Surgeon: Irene Limbo, MD;  Location: [place];  Service: Plastics;  Laterality: Bilateral;    There were no vitals filed for this visit.   Subjective Assessment - 10/17/20 1607    Subjective A little sore after last time, but not as sore as the time before.  Can I leave at 4:30 today.  I have to be somewhere.    Pertinent History Prior to bilateral mastectomies had neoadjuvant chemo, but did not have to have radiation.  Had SLNB and all 3 nodes were neg.  At  the time of her mastectomies tissue expanders were placed.  She developed infection in the right and it was removed on 08/25/20/  She will have it replaced when it is safe to do so.    Patient Stated Goals Return to PLOF, reaching , lifting, using arm    Currently in Pain? No/denies  [place] Adult PT Treatment/Exercise - 10/17/20 0001      Manual Therapy   Myofascial Release Right medial arm/elbow to areas of cording    Passive ROM PROM right shoulder with MFR to decrease cording                    PT Short Term Goals - 09/17/20 1953      PT SHORT TERM GOAL #1   Title Pt. will attend ABC class virtually to learn exs and precautions for preventing lymphedema    Time 3    Period Weeks    Status New    Target Date 10/08/20             PT Long Term Goals - 09/17/20 1953      PT LONG TERM GOAL #1   Title Pt will have  AROM right shoulder flex and abd 120 deg for improved reaching ability     Baseline abd 75, flex 101    Time 3    Period Weeks    Status New      PT LONG TERM GOAL #2   Title Pt will have right shoulder AROM WNL without pain for improved household activities    Baseline flex 155 left, abd 159 left    Time 6    Period Weeks    Status New      PT LONG TERM GOAL #3   Title Pt will be able to dress without limitation    Time 4    Period Weeks    Status New      PT LONG TERM GOAL #4   Title Quick dash score will be no greater than 10%    Baseline 55%    Time 6    Period Weeks    Status New    Target Date 10/29/20                 Plan - 10/17/20 1640    Clinical Impression Statement Pt requested shorter visit secondary to having to go out of town.  Mild soreness after last visit.  Cording areas still present and very visible/palpable.  Awaiting benefit info for prophylactic sleeve    Personal Factors and Comorbidities Comorbidity 2    Comorbidities SLNB, right breast infection with removal of tissue expander    Examination-Activity Limitations Bathing;Reach Overhead;Carry;Dressing    Stability/Clinical Decision Making Stable/Uncomplicated    Clinical Decision Making Low    Rehab Potential Excellent    PT Frequency 2x / week    PT Duration 6 weeks    PT Treatment/Interventions Therapeutic exercise;Patient/family education;Neuromuscular re-education;Manual techniques;Manual lymph drainage;Passive range of motion;Taping    PT Next Visit Plan Sent face sheet to liz to check benefits for prophylactic sleeve and gauntlet, Cont MFR, PROM, exercises    PT Home Exercise Plan 4 post op exs and supine AA flex and stargazer stretch, TG soft, TB exercises    Consulted and Agree with Plan of Care Patient           Patient will benefit from skilled therapeutic intervention in order to improve the following deficits and impairments:  Decreased range of motion, Impaired UE functional use, Pain, Decreased knowledge of precautions, Decreased activity tolerance,  Impaired flexibility, Postural dysfunction, Decreased strength, Decreased mobility  Visit Diagnosis: Acute pain of right shoulder  Aftercare following surgery for neoplasm  Stiffness of right shoulder, not elsewhere classified     Problem List Patient Active Problem List  Diagnosis Date Noted  . Breast cancer, right ([place]) 08/06/2020  . Port-A-Cath in place 03/22/2020  . Genetic testing 02/26/2020  . Family history of pancreatic cancer   . Family history of breast cancer   . Family history of lung cancer   . Malignant neoplasm of upper-outer quadrant of right breast in female, estrogen receptor negative ([place] [place]) 02/08/2020  . Special screening for malignant neoplasms, colon   . Disordered eating 09/30/2017  . Avitaminosis D 09/30/2017  . Family history of thyroid disease 09/30/2017  . Fibroid 08/31/2017  . Irregular bleeding 08/19/2017  . Arthralgia of hip 10/07/2015    Claris Pong 10/17/2020, 4:47 PM  [place], [place], [address] Phone: [phone]   Fax:  [phone]  Name: Virginia [name] MRN: [id number] Date of Birth: Apr 03, 1966 Cheral Almas, PT 10/17/20 4:48 PM

## 2020-10-22 ENCOUNTER — Other Ambulatory Visit: Payer: Self-pay

## 2020-10-22 ENCOUNTER — Ambulatory Visit: Payer: 59

## 2020-10-22 DIAGNOSIS — C50411 Malignant neoplasm of upper-outer quadrant of right female breast: Secondary | ICD-10-CM

## 2020-10-22 DIAGNOSIS — M25511 Pain in right shoulder: Secondary | ICD-10-CM

## 2020-10-22 DIAGNOSIS — Z170 Estrogen receptor positive status [ER+]: Secondary | ICD-10-CM | POA: Diagnosis not present

## 2020-10-22 DIAGNOSIS — Z483 Aftercare following surgery for neoplasm: Secondary | ICD-10-CM

## 2020-10-22 DIAGNOSIS — M25611 Stiffness of right shoulder, not elsewhere classified: Secondary | ICD-10-CM

## 2020-10-22 NOTE — Therapy (Signed)
[place], [place], [address] Phone: [phone]   Fax:  [phone]  Physical Therapy Treatment  Patient Details  Name: Virginia [name] MRN: [id number] Date of Birth: 1966/08/28 Referring Provider (PT): Dr. Iran Planas   Encounter Date: 10/22/2020   PT End of Session - 10/22/20 1659    Visit Number 8    Number of Visits 13    Date for PT Re-Evaluation 10/29/20    PT Start Time 1761    PT Stop Time 1657    PT Time Calculation (min) 49 min    Activity Tolerance Patient tolerated treatment well;No increased pain    Behavior During Therapy WFL for tasks assessed/performed           Past Medical History:  Diagnosis Date  . Anorexia   . Anxiety   . Arthritis    knees  . Cancer [facility])    R Breast Cancer 2021  . Family history of breast cancer   . Family history of lung cancer   . Family history of pancreatic cancer   . Fibroids   . Wears glasses     Past Surgical History:  Procedure Laterality Date  . BREAST RECONSTRUCTION WITH PLACEMENT OF TISSUE EXPANDER AND ALLODERM Bilateral 08/06/2020   Procedure: BILATERAL BREAST RECONSTRUCTION WITH PLACEMENT OF TISSUE EXPANDER AND ALLODERM;  Surgeon: Irene Limbo, MD;  Location: [place];  Service: Plastics;  Laterality: Bilateral;  . CLEFT PALATE REPAIR    . CLEFT PALATE REPAIR  1970  . COLONOSCOPY WITH PROPOFOL N/A 04/25/2018   Procedure: COLONOSCOPY WITH PROPOFOL;  Surgeon: [name], MD;  Location: [place];  Service: Endoscopy;  Laterality: N/A;  . NIPPLE SPARING MASTECTOMY WITH SENTINEL LYMPH NODE BIOPSY Bilateral 08/06/2020   Procedure: BILATERAL NIPPLE SPARING MASTECTOMIES WITH RIGHT AXILLARY  SENTINEL LYMPH NODE MAPPING;  Surgeon: Erroll Luna, MD;  Location: [place];  Service: General;  Laterality: Bilateral;  PECTORAL BLOCK  . PORT-A-CATH REMOVAL Right 08/06/2020   Procedure: REMOVAL  PORT-A-CATH;  Surgeon: Erroll Luna, MD;  Location: [place];  Service: General;  Laterality: Right;  . PORTACATH PLACEMENT Right 02/22/2020   Procedure: INSERTION PORT-A-CATH WITH ULTRASOUND GUIDANCE;  Surgeon: Erroll Luna, MD;  [facility];  Service: General;  Laterality: Right;  . REMOVAL OF TISSUE EXPANDER AND PLACEMENT OF IMPLANT Bilateral 08/25/2020   Procedure: REMOVAL OF RIGHT BREAST TISSUE EXPANDER; DEBRIDEMENT BILATERAL MASTECTOMY FLAP NIPPLES;  Surgeon: Irene Limbo, MD;  Location: [place];  Service: Plastics;  Laterality: Bilateral;    There were no vitals filed for this visit.   Subjective Assessment - 10/22/20 1609    Subjective Got measured this morning for sleeve.  Not too sore after last visit.  ROM is still good but cording is still there.  I do feel it when I stretch overhead. Having surgery on January to replace the expander    Pertinent History Prior to bilateral mastectomies had neoadjuvant chemo, but did not have to have radiation.  Had SLNB and all 3 nodes were neg.  At  the time of her mastectomies tissue expanders were placed.  She developed infection in the right and it was removed on 08/25/20/  She will have it replaced when it is safe to do so.    Limitations Lifting;House hold activities    Patient Stated Goals Return to PLOF, reaching , lifting, using arm    Currently in Pain? No/denies    Pain Score  0-No pain    Pain Orientation Right    Pain Onset More than a month ago    Pain Frequency Intermittent    Effect of Pain on Daily Activities limits reaching, dressing                             [facility] Adult PT Treatment/Exercise - 10/22/20 0001      Shoulder Exercises: Standing   Horizontal ABduction Both;10 reps    Theraband Level (Shoulder Horizontal ABduction) Level 1 (Yellow)    Retraction Strengthening;Both;10 reps    Diagonals Right;Left;10 reps    Theraband Level (Shoulder Diagonals) Level 1 (Yellow)     Other Standing Exercises serratus on wall x 15      Shoulder Exercises: Pulleys   Flexion 2 minutes    ABduction 2 minutes      Shoulder Exercises: Therapy Ball   Flexion Both;10 reps      Manual Therapy   Myofascial Release Right medial arm/elbow, forearm to areas of cording    Passive ROM PROM right shoulder with MFR to decrease cording                    PT Short Term Goals - 09/17/20 1953      PT SHORT TERM GOAL #1   Title Pt. will attend ABC class virtually to learn exs and precautions for preventing lymphedema    Time 3    Period Weeks    Status New    Target Date 10/08/20             PT Long Term Goals - 09/17/20 1953      PT LONG TERM GOAL #1   Title Pt will have  AROM right shoulder flex and abd 120 deg for improved reaching ability    Baseline abd 75, flex 101    Time 3    Period Weeks    Status New      PT LONG TERM GOAL #2   Title Pt will have right shoulder AROM WNL without pain for improved household activities    Baseline flex 155 left, abd 159 left    Time 6    Period Weeks    Status New      PT LONG TERM GOAL #3   Title Pt will be able to dress without limitation    Time 4    Period Weeks    Status New      PT LONG TERM GOAL #4   Title Quick dash score will be no greater than 10%    Baseline 55%    Time 6    Period Weeks    Status New    Target Date 10/29/20                 Plan - 10/22/20 1700    Clinical Impression Statement Pts ROM continues to be very good, although cording still present at axilla, elbow and radiating to thumb area.  Less visible today with end ROM, but still very palpable.  pt did very well with progression of band exs.    Personal Factors and Comorbidities Comorbidity 2    Comorbidities SLNB, right breast infection with removal of tissue expander    Examination-Activity Limitations Bathing;Reach Overhead;Carry;Dressing    Stability/Clinical Decision Making Stable/Uncomplicated    Clinical  Decision Making Low    Rehab Potential Excellent    PT Frequency 2x / week  PT Duration 6 weeks    PT Treatment/Interventions Therapeutic exercise;Patient/family education;Neuromuscular re-education;Manual techniques;Manual lymph drainage;Passive range of motion;Taping    PT Next Visit Plan Cont MFR to cording, PROM, exs, paperwork sent for sleeve to be ordered    PT Home Exercise Plan 4 post op exs and supine AA flex and stargazer stretch, TG soft, TB exercises    Consulted and Agree with Plan of Care Patient           Patient will benefit from skilled therapeutic intervention in order to improve the following deficits and impairments:  Decreased range of motion, Impaired UE functional use, Pain, Decreased knowledge of precautions, Decreased activity tolerance, Impaired flexibility, Postural dysfunction, Decreased strength, Decreased mobility  Visit Diagnosis: Aftercare following surgery for neoplasm  Stiffness of right shoulder, not elsewhere classified  Malignant neoplasm of upper-outer quadrant of right breast in female, estrogen receptor positive ([facility])  Acute pain of right shoulder     Problem List Patient Active Problem List   Diagnosis Date Noted  . Breast cancer, right ([place]) 08/06/2020  . Port-A-Cath in place 03/22/2020  . Genetic testing 02/26/2020  . Family history of pancreatic cancer   . Family history of breast cancer   . Family history of lung cancer   . Malignant neoplasm of upper-outer quadrant of right breast in female, estrogen receptor negative ([place]) 02/08/2020  . Special screening for malignant neoplasms, colon   . Disordered eating 09/30/2017  . Avitaminosis D 09/30/2017  . Family history of thyroid disease 09/30/2017  . Fibroid 08/31/2017  . Irregular bleeding 08/19/2017  . Arthralgia of hip 10/07/2015    Claris Pong 10/22/2020, 5:10 PM  [place], [place],  [address] Phone: [phone]   Fax:  [phone]  Name: Virginia [name] MRN: [id number] Date of Birth: 1966/04/23 Cheral Almas, PT 10/22/20 5:11 PM

## 2020-10-24 ENCOUNTER — Ambulatory Visit: Payer: 59 | Admitting: Rehabilitation

## 2020-10-30 DIAGNOSIS — C50411 Malignant neoplasm of upper-outer quadrant of right female breast: Secondary | ICD-10-CM | POA: Diagnosis not present

## 2020-11-06 ENCOUNTER — Ambulatory Visit: Payer: 59

## 2020-11-06 ENCOUNTER — Other Ambulatory Visit: Payer: Self-pay

## 2020-11-06 DIAGNOSIS — M25511 Pain in right shoulder: Secondary | ICD-10-CM

## 2020-11-06 DIAGNOSIS — C50411 Malignant neoplasm of upper-outer quadrant of right female breast: Secondary | ICD-10-CM

## 2020-11-06 DIAGNOSIS — Z170 Estrogen receptor positive status [ER+]: Secondary | ICD-10-CM | POA: Diagnosis not present

## 2020-11-06 DIAGNOSIS — M25611 Stiffness of right shoulder, not elsewhere classified: Secondary | ICD-10-CM

## 2020-11-06 DIAGNOSIS — Z483 Aftercare following surgery for neoplasm: Secondary | ICD-10-CM | POA: Diagnosis not present

## 2020-11-06 NOTE — Therapy (Signed)
[place], [place], [address] Phone: [phone]   Fax:  [phone]  Physical Therapy Treatment  Patient Details  Name: Virginia [name] MRN: [id number] Date of Birth: Jul 29, 1966 Referring Provider (PT): Dr. Iran Planas   Encounter Date: 11/06/2020   PT End of Session - 11/06/20 1732    Visit Number 9    Number of Visits 15    Date for PT Re-Evaluation 12/18/20    PT Start Time 1062    PT Stop Time 6948    PT Time Calculation (min) 50 min    Activity Tolerance Patient tolerated treatment well;No increased pain    Behavior During Therapy WFL for tasks assessed/performed           Past Medical History:  Diagnosis Date  . Anorexia   . Anxiety   . Arthritis    knees  . Cancer [facility])    R Breast Cancer 2021  . Family history of breast cancer   . Family history of lung cancer   . Family history of pancreatic cancer   . Fibroids   . Wears glasses     Past Surgical History:  Procedure Laterality Date  . BREAST RECONSTRUCTION WITH PLACEMENT OF TISSUE EXPANDER AND ALLODERM Bilateral 08/06/2020   Procedure: BILATERAL BREAST RECONSTRUCTION WITH PLACEMENT OF TISSUE EXPANDER AND ALLODERM;  Surgeon: Irene Limbo, MD;  Location: [place];  Service: Plastics;  Laterality: Bilateral;  . CLEFT PALATE REPAIR    . CLEFT PALATE REPAIR  1970  . COLONOSCOPY WITH PROPOFOL N/A 04/25/2018   Procedure: COLONOSCOPY WITH PROPOFOL;  Surgeon: [name], MD;  Location: [place];  Service: Endoscopy;  Laterality: N/A;  . NIPPLE SPARING MASTECTOMY WITH SENTINEL LYMPH NODE BIOPSY Bilateral 08/06/2020   Procedure: BILATERAL NIPPLE SPARING MASTECTOMIES WITH RIGHT AXILLARY  SENTINEL LYMPH NODE MAPPING;  Surgeon: Erroll Luna, MD;  Location: [place];  Service: General;  Laterality: Bilateral;  PECTORAL BLOCK  . PORT-A-CATH REMOVAL Right 08/06/2020   Procedure: REMOVAL  PORT-A-CATH;  Surgeon: Erroll Luna, MD;  Location: [place];  Service: General;  Laterality: Right;  . PORTACATH PLACEMENT Right 02/22/2020   Procedure: INSERTION PORT-A-CATH WITH ULTRASOUND GUIDANCE;  Surgeon: Erroll Luna, MD;  [facility];  Service: General;  Laterality: Right;  . REMOVAL OF TISSUE EXPANDER AND PLACEMENT OF IMPLANT Bilateral 08/25/2020   Procedure: REMOVAL OF RIGHT BREAST TISSUE EXPANDER; DEBRIDEMENT BILATERAL MASTECTOMY FLAP NIPPLES;  Surgeon: Irene Limbo, MD;  Location: [place];  Service: Plastics;  Laterality: Bilateral;    There were no vitals filed for this visit.   Subjective Assessment - 11/06/20 1508    Subjective We have a new granddaughter so I missed several appts.  I didn't know I was supposed to bring the sleeves with me. Cording seems to be a little better but they are still there.  I got the sleeve last Saturday and I have been wearing the sleeve and gantlet everyday after work. Its very comfortable and not too hot.              [facility] PT Assessment - 11/06/20 0001      AROM   Right Shoulder Extension 42 Degrees    Right Shoulder Flexion 162 Degrees    Right Shoulder ABduction 175 Degrees    Right Shoulder Internal Rotation 63 Degrees    Right Shoulder External Rotation 99 Degrees  [name] - 11/06/20 0001    Open a tight or new jar Moderate difficulty    Do heavy household chores (wash walls, wash floors) No difficulty    Carry a shopping bag or briefcase No difficulty    Wash your back Mild difficulty    Use a knife to cut food No difficulty    Recreational activities in which you take some force or impact through your arm, shoulder, or hand (golf, hammering, tennis) Mild difficulty    During the past week, to what extent has your arm, shoulder or hand problem interfered with your normal social activities with family, friends, neighbors, or groups? Not at all    During the past week, to what  extent has your arm, shoulder or hand problem limited your work or other regular daily activities Not at all    Arm, shoulder, or hand pain. None    Tingling (pins and needles) in your arm, shoulder, or hand Moderate    Difficulty Sleeping No difficulty    DASH Score 13.64 %                  [facility] Adult PT Treatment/Exercise - 11/06/20 0001      Manual Therapy   Myofascial Release Right medial arm/elbow, forearm to areas of cording    Passive ROM PROM right shoulder with MFR to decrease cording                  PT Education - 11/06/20 1548    Education Details Pt was educated in pec wall stretch, IR with strap, shoulder ext stretch, and standing lat stretch.  She was given illustrated and written instructions for HEP.            PT Short Term Goals - 11/06/20 1537      PT SHORT TERM GOAL #1   Title Pt. will attend ABC class virtually to learn exs and precautions for preventing lymphedema    Time 3    Period Weeks    Status Not Met    Target Date 12/11/20             PT Long Term Goals - 11/06/20 1529      PT LONG TERM GOAL #1   Title Pt will have  AROM right shoulder flex and abd 120 deg for improved reaching ability    Time 3    Period Weeks    Status Achieved      PT LONG TERM GOAL #2   Title Pt will have right shoulder AROM WNL without pain for improved household activities    Time 6    Period Weeks    Status Achieved      PT LONG TERM GOAL #3   Title Pt will be able to dress without limitation    Time 4    Period Weeks    Status Achieved      PT LONG TERM GOAL #4   Title Quick dash score will be no greater than 10%    Time 6    Period Weeks    Status Partially Met      PT LONG TERM GOAL #5   Title Cording will be improved by atleast 75%    Time 6    Period Weeks    Status New    Target Date 12/18/20                 Plan - 11/06/20 1734  Clinical Impression Statement Pt has achieved the majority of goals established  at initial evaluation however cording is still present and she has not achieved her quick dash score.  She did receive her compression sleeve and she is wearing the sleeve and gauntlet in the evening when she gets home from work for a few hours.  Cording is not as prominent but still present . She will have her expander placed on Jan. 7 and will attend therapy 1x per week until then.  she will return after drains are removed and MD has given the OK for reassessment.    Personal Factors and Comorbidities Comorbidity 2    Comorbidities SLNB, right breast infection with removal of tissue expander    Examination-Activity Limitations Bathing;Reach Overhead;Carry;Dressing    Stability/Clinical Decision Making Stable/Uncomplicated    PT Frequency 1x / week    PT Duration 6 weeks    PT Treatment/Interventions Therapeutic exercise;Patient/family education;Neuromuscular re-education;Manual techniques;Manual lymph drainage;Passive range of motion;Taping    PT Next Visit Plan assess compression sleeve if she brings, continue MFR, PROM, exercises, add  foam roll    PT Home Exercise Plan 4 post op exs and supine AA flex and stargazer stretch,  TB exercises, IR with strap, pec wall stretch,shoulder extension stretch    Consulted and Agree with Plan of Care Patient           Patient will benefit from skilled therapeutic intervention in order to improve the following deficits and impairments:  Decreased range of motion,Impaired UE functional use,Pain,Decreased knowledge of precautions,Decreased activity tolerance,Impaired flexibility,Postural dysfunction,Decreased strength,Decreased mobility  Visit Diagnosis: Stiffness of right shoulder, not elsewhere classified  Malignant neoplasm of upper-outer quadrant of right breast in female, estrogen receptor positive [facility])  Aftercare following surgery for neoplasm  Acute pain of right shoulder     Problem List Patient Active Problem List   Diagnosis Date Noted   . Breast cancer, right ([place]) 08/06/2020  . Port-A-Cath in place 03/22/2020  . Genetic testing 02/26/2020  . Family history of pancreatic cancer   . Family history of breast cancer   . Family history of lung cancer   . Malignant neoplasm of upper-outer quadrant of right breast in female, estrogen receptor negative ([place]) 02/08/2020  . Special screening for malignant neoplasms, colon   . Disordered eating 09/30/2017  . Avitaminosis D 09/30/2017  . Family history of thyroid disease 09/30/2017  . Fibroid 08/31/2017  . Irregular bleeding 08/19/2017  . Arthralgia of hip 10/07/2015    Claris Pong 11/06/2020, 5:43 PM  [place], [place], [address] Phone: [phone]   Fax:  [phone]  Name: IESHIA HATCHER MRN: [id number] Date of Birth: 1966/06/16 Cheral Almas, PT 11/06/20 5:44 PM

## 2020-11-06 NOTE — Patient Instructions (Signed)
Access Code: [email or web address] URL: [email or web address] Date: 11/06/2020 Prepared by: Cheral Almas  Exercises Standing Shoulder Internal Rotation Stretch with Towel - 1 x daily - 7 x weekly - 1 sets - 10 reps Standing Shoulder and Trunk Flexion at Table - 1 x daily - 7 x weekly - 1 sets - 5 reps Chest and Bicep Stretch - Arms Behind Back - 1 x daily - 7 x weekly - 1 sets - 10 reps Corner Pec Major Stretch - 1 x daily - 7 x weekly - 3 reps

## 2020-11-11 ENCOUNTER — Other Ambulatory Visit (HOSPITAL_COMMUNITY): Payer: Self-pay | Admitting: Plastic Surgery

## 2020-11-11 MED FILL — METHOCARBAMOL 500 MG TABS: 500 | 10 days supply | Qty: 25 | Fill #0

## 2020-11-11 MED FILL — SULFAMETHOXAZOLE-TMP DS TAB: 800-160 | 7 days supply | Qty: 14 | Fill #0

## 2020-11-11 MED FILL — IBUPROFEN 800 MG TABS: 800 | 10 days supply | Qty: 30 | Fill #0

## 2020-11-11 NOTE — H&P (Signed)
  Subjective:     Patient ID: Virginia [name] is a 54 y.o. female.  HPI 3 months post op bilateral mastectomies with immediate expander based reconstruction. Onset fevers 2 weeks post op evening drains removed with subsequent development right chest cellulitis swelling with eventual removal right TE/ADM, debridement bilateral nipples. Culture with S aureus, completed Augmentin course. Plan replacement right chest tissue expander next month. Became a grandmother this month.  Presented with palpable mass right breast. MMG/US showed 2 cm mass 10 o clock, no axillary lymphadenopathy. 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  MRI demonstrated 2 cm mass at 10 o clock, three indeterminate 4 mm, 5 mm, and 5 mm masses, single prominent axillary node. Second look axillary [place] without lymphadenopathy.  Staging scans negative for distant disease, 5 mm axillary node noted.  Completed neoadjuvant chemotherapy. Final MRI with complete imaging resolution including node.  Final pathology no residual invasice carcinoma, 0/2 SLN. Pathology right nipple debridement benign.  Genetics negative  Prior 34 B happy with this. Right mastectomy 329 g Left 323 g  PMH significant for CP, repair done at [facility].  Works for [employer], works on point of care machines. Lives with husband and adult son.  Review of Systems Remainder 12 point review negative      Objective:   Physical Exam  Cardiovascular: Normal rate, regular rhythm and normal heart sounds.  Pulmonary/Chest: Effort normal and breath sounds normal.    Chest: left chest soft expanded Right chest nipple has lost height, soft tissue contracted      Assessment:     Right breast ca UOQ ER- Neoadjuvant chemotherapy S/p bilateral NSM, prepectoral TE/ADM (Alloderm) reconstruction, Right SLN S/p removal right TE/ADM, debridement bilateral nipples    Plan:   Plan right breast reconstruction with replacement tissue expander,  acellular dermis. Plan OP surgery, reviewed 2 drains.  Reviewed use of acellular dermis in reconstruction, cadaveric source, incorporation over several weeks, risk that if has seroma or infection can act as additional nidus for infection if not incorporated.  Reviewed reconstruction will be asensate and not stimulate. Reviewed additional risks including but not limited to risks mastectomy flap necrosis requiring additional surgery, seroma, hematoma, asymmetry, need to additional procedures, fat necrosis, DVT/PE, damage to adjacent structures, cardiopulmonary complications. Reviewed the loss nipple height right is likely permanent, and similarly with the soft tissue contraction will likely eventually have persistent NAC position asymmetry.  Rx for ibuprofen 800 mg, Bactrim, and Robaxin given.  Discussed risk COVID infectionthrough this elective surgery. Patient will receive COVID testing prior to surgery. Discussed even if patient receivesa negative test result, the tests in some cases may fail to detect the virus or patient maycontract COVID after the test.COVID 19 infectionbefore/during/aftersurgery may result in lead to a higher chance of complication and death.   Natrelle 133S FV-12-T 400 ml tissue expander placed  LEFT fill volume 330 ml saline

## 2020-11-13 ENCOUNTER — Other Ambulatory Visit: Payer: Self-pay

## 2020-11-13 ENCOUNTER — Ambulatory Visit: Payer: 59

## 2020-11-13 DIAGNOSIS — M25511 Pain in right shoulder: Secondary | ICD-10-CM

## 2020-11-13 DIAGNOSIS — Z483 Aftercare following surgery for neoplasm: Secondary | ICD-10-CM | POA: Diagnosis not present

## 2020-11-13 DIAGNOSIS — Z170 Estrogen receptor positive status [ER+]: Secondary | ICD-10-CM

## 2020-11-13 DIAGNOSIS — C50411 Malignant neoplasm of upper-outer quadrant of right female breast: Secondary | ICD-10-CM | POA: Diagnosis not present

## 2020-11-13 DIAGNOSIS — M25611 Stiffness of right shoulder, not elsewhere classified: Secondary | ICD-10-CM | POA: Diagnosis not present

## 2020-11-13 NOTE — Therapy (Signed)
[place], [place], [address] Phone: [phone]   Fax:  [phone]  Physical Therapy Treatment  Patient Details  Name: Virginia [name] MRN: [id number] Date of Birth: 1966/10/25 Referring Provider (PT): Dr. Iran Planas   Encounter Date: 11/13/2020   PT End of Session - 11/13/20 1702    Visit Number 10    Number of Visits 15    Date for PT Re-Evaluation 12/18/20    PT Start Time 1608    PT Stop Time 1655    PT Time Calculation (min) 47 min    Activity Tolerance Patient tolerated treatment well    Behavior During Therapy [facility] for tasks assessed/performed           Past Medical History:  Diagnosis Date  . Anorexia   . Anxiety   . Arthritis    knees  . Cancer [facility])    R Breast Cancer 2021  . Family history of breast cancer   . Family history of lung cancer   . Family history of pancreatic cancer   . Fibroids   . Wears glasses     Past Surgical History:  Procedure Laterality Date  . BREAST RECONSTRUCTION WITH PLACEMENT OF TISSUE EXPANDER AND ALLODERM Bilateral 08/06/2020   Procedure: BILATERAL BREAST RECONSTRUCTION WITH PLACEMENT OF TISSUE EXPANDER AND ALLODERM;  Surgeon: Irene Limbo, MD;  Location: [place];  Service: Plastics;  Laterality: Bilateral;  . CLEFT PALATE REPAIR    . CLEFT PALATE REPAIR  1970  . COLONOSCOPY WITH PROPOFOL N/A 04/25/2018   Procedure: COLONOSCOPY WITH PROPOFOL;  Surgeon: [name], MD;  Location: [place];  Service: Endoscopy;  Laterality: N/A;  . NIPPLE SPARING MASTECTOMY WITH SENTINEL LYMPH NODE BIOPSY Bilateral 08/06/2020   Procedure: BILATERAL NIPPLE SPARING MASTECTOMIES WITH RIGHT AXILLARY  SENTINEL LYMPH NODE MAPPING;  Surgeon: Erroll Luna, MD;  Location: [place];  Service: General;  Laterality: Bilateral;  PECTORAL BLOCK  . PORT-A-CATH REMOVAL Right 08/06/2020   Procedure: REMOVAL PORT-A-CATH;  Surgeon:  Erroll Luna, MD;  Location: [place];  Service: General;  Laterality: Right;  . PORTACATH PLACEMENT Right 02/22/2020   Procedure: INSERTION PORT-A-CATH WITH ULTRASOUND GUIDANCE;  Surgeon: Erroll Luna, MD;  Location: [facility];  Service: General;  Laterality: Right;  . REMOVAL OF TISSUE EXPANDER AND PLACEMENT OF IMPLANT Bilateral 08/25/2020   Procedure: REMOVAL OF RIGHT BREAST TISSUE EXPANDER; DEBRIDEMENT BILATERAL MASTECTOMY FLAP NIPPLES;  Surgeon: Irene Limbo, MD;  Location: [place];  Service: Plastics;  Laterality: Bilateral;    There were no vitals filed for this visit.   Subjective Assessment - 11/13/20 1613    Subjective Still wearing the sleeve and it is comfortable.  I think the cording is getting better, but it is still there.  Have my Right expander placed on 11/22/20 so I won't be able to come in for awhile.    Pertinent History Prior to bilateral mastectomies had neoadjuvant chemo, but did not have to have radiation.  Had SLNB and all 3 nodes were neg.  At  the time of her mastectomies tissue expanders were placed.  She developed infection in the right and it was removed on 08/25/20/  She will have it replaced when it is safe to do so.    Limitations Lifting;House hold activities    Patient Stated Goals Return to PLOF, reaching , lifting, using arm    Currently in Pain? No/denies    Pain Score 0-No pain  [facility] Adult PT Treatment/Exercise - 11/13/20 0001      Manual Therapy   Edema Management wearing prophylactic sleeve to decrease cording    Myofascial Release Right medial arm/elbow, forearm to areas of cording    Passive ROM PROM right shoulder with MFR to decrease cording                    PT Short Term Goals - 11/06/20 1537      PT SHORT TERM GOAL #1   Title Pt. will attend ABC class virtually to learn exs and precautions for preventing lymphedema    Time 3    Period Weeks    Status Not Met     Target Date 12/11/20             PT Long Term Goals - 11/06/20 1529      PT LONG TERM GOAL #1   Title Pt will have  AROM right shoulder flex and abd 120 deg for improved reaching ability    Time 3    Period Weeks    Status Achieved      PT LONG TERM GOAL #2   Title Pt will have right shoulder AROM WNL without pain for improved household activities    Time 6    Period Weeks    Status Achieved      PT LONG TERM GOAL #3   Title Pt will be able to dress without limitation    Time 4    Period Weeks    Status Achieved      PT LONG TERM GOAL #4   Title Quick dash score will be no greater than 10%    Time 6    Period Weeks    Status Partially Met      PT LONG TERM GOAL #5   Title Cording will be improved by atleast 75%    Time 6    Period Weeks    Status New    Target Date 12/18/20                 Plan - 11/13/20 1703    Clinical Impression Statement Cording while still present is not nearly as visible.  Pt was able to feel good stretch in region of cords when pin and stretch was done.  She is wearing her compression sleeve and it does seem to help with cording.  She has her right tissue expander placed on 11/22/20 so it will be 4 weeks or more before she comes in again for recheck of ROM/cording    Personal Factors and Comorbidities Comorbidity 2    Comorbidities SLNB, right breast infection with removal of tissue expander    Examination-Activity Limitations Bathing;Reach Overhead;Carry;Dressing    Stability/Clinical Decision Making Stable/Uncomplicated    Clinical Decision Making Low    Rehab Potential Excellent    PT Frequency 1x / week    PT Duration 6 weeks    PT Treatment/Interventions Therapeutic exercise;Patient/family education;Neuromuscular re-education;Manual techniques;Manual lymph drainage;Passive range of motion;Taping    PT Next Visit Plan Reassess after tissue expander placed on 11/23/19, Cont MFR, PROM prn    PT Home Exercise Plan 4 post op exs and  supine AA flex and stargazer stretch,  TB exercises, IR with strap, pec wall stretch,shoulder extension stretch(Only resumingg Per MD orders after her surgery    Consulted and Agree with Plan of Care Patient           Patient will benefit from skilled  therapeutic intervention in order to improve the following deficits and impairments:  Decreased range of motion,Impaired UE functional use,Pain,Decreased knowledge of precautions,Decreased activity tolerance,Impaired flexibility,Postural dysfunction,Decreased strength,Decreased mobility  Visit Diagnosis: Stiffness of right shoulder, not elsewhere classified  Malignant neoplasm of upper-outer quadrant of right breast in female, estrogen receptor positive [facility])  Aftercare following surgery for neoplasm  Acute pain of right shoulder     Problem List Patient Active Problem List   Diagnosis Date Noted  . Breast cancer, right ([place]) 08/06/2020  . Port-A-Cath in place 03/22/2020  . Genetic testing 02/26/2020  . Family history of pancreatic cancer   . Family history of breast cancer   . Family history of lung cancer   . Malignant neoplasm of upper-outer quadrant of right breast in female, estrogen receptor negative ([place]) 02/08/2020  . Special screening for malignant neoplasms, colon   . Disordered eating 09/30/2017  . Avitaminosis D 09/30/2017  . Family history of thyroid disease 09/30/2017  . Fibroid 08/31/2017  . Irregular bleeding 08/19/2017  . Arthralgia of hip 10/07/2015    Claris Pong 11/13/2020, 5:07 PM  [place], [place], [address] Phone: [phone]   Fax:  [phone]  Name: Virginia [name] MRN: [id number] Date of Birth: [birth year]  Cheral Almas, PT 11/13/20 5:08 PM

## 2020-11-19 ENCOUNTER — Encounter (HOSPITAL_BASED_OUTPATIENT_CLINIC_OR_DEPARTMENT_OTHER): Payer: Self-pay | Admitting: Plastic Surgery

## 2020-11-19 ENCOUNTER — Other Ambulatory Visit: Payer: Self-pay

## 2020-11-22 ENCOUNTER — Encounter: Payer: Self-pay | Admitting: Adult Health

## 2020-11-22 ENCOUNTER — Inpatient Hospital Stay: Payer: 59 | Attending: Adult Health | Admitting: Adult Health

## 2020-11-22 ENCOUNTER — Other Ambulatory Visit (HOSPITAL_COMMUNITY)
Admission: RE | Admit: 2020-11-22 | Discharge: 2020-11-22 | Disposition: A | Discharge status: Home / Self Care | Payer: 59 | Source: Ambulatory Visit | Attending: Plastic Surgery | Admitting: Plastic Surgery

## 2020-11-22 ENCOUNTER — Other Ambulatory Visit: Payer: Self-pay

## 2020-11-22 VITALS — BP 96/64 | HR 102 | Temp 98.1°F | Resp 18 | Ht 61.0 in | Wt 139.4 lb

## 2020-11-22 DIAGNOSIS — Z8349 Family history of other endocrine, nutritional and metabolic diseases: Secondary | ICD-10-CM | POA: Insufficient documentation

## 2020-11-22 DIAGNOSIS — Z20822 Contact with and (suspected) exposure to covid-19: Secondary | ICD-10-CM | POA: Insufficient documentation

## 2020-11-22 DIAGNOSIS — Z8249 Family history of ischemic heart disease and other diseases of the circulatory system: Secondary | ICD-10-CM | POA: Diagnosis not present

## 2020-11-22 DIAGNOSIS — Z833 Family history of diabetes mellitus: Secondary | ICD-10-CM | POA: Insufficient documentation

## 2020-11-22 DIAGNOSIS — C50411 Malignant neoplasm of upper-outer quadrant of right female breast: Secondary | ICD-10-CM

## 2020-11-22 DIAGNOSIS — Z171 Estrogen receptor negative status [ER-]: Secondary | ICD-10-CM

## 2020-11-22 DIAGNOSIS — Z9013 Acquired absence of bilateral breasts and nipples: Secondary | ICD-10-CM | POA: Insufficient documentation

## 2020-11-22 DIAGNOSIS — Z01812 Encounter for preprocedural laboratory examination: Secondary | ICD-10-CM | POA: Diagnosis not present

## 2020-11-22 DIAGNOSIS — Z818 Family history of other mental and behavioral disorders: Secondary | ICD-10-CM | POA: Diagnosis not present

## 2020-11-22 DIAGNOSIS — Z823 Family history of stroke: Secondary | ICD-10-CM | POA: Diagnosis not present

## 2020-11-22 DIAGNOSIS — Z800 Family history of malignant neoplasm of digestive organs: Secondary | ICD-10-CM | POA: Diagnosis not present

## 2020-11-22 DIAGNOSIS — Z79899 Other long term (current) drug therapy: Secondary | ICD-10-CM | POA: Diagnosis not present

## 2020-11-22 DIAGNOSIS — Z803 Family history of malignant neoplasm of breast: Secondary | ICD-10-CM | POA: Insufficient documentation

## 2020-11-22 DIAGNOSIS — R4189 Other symptoms and signs involving cognitive functions and awareness: Secondary | ICD-10-CM | POA: Diagnosis not present

## 2020-11-22 DIAGNOSIS — Z801 Family history of malignant neoplasm of trachea, bronchus and lung: Secondary | ICD-10-CM | POA: Diagnosis not present

## 2020-11-22 LAB — SARS CORONAVIRUS 2 (TAT 6-24 HRS): SARS Coronavirus 2: NEGATIVE

## 2020-11-22 NOTE — Progress Notes (Signed)
SURVIVORSHIP VISIT:   BRIEF ONCOLOGIC HISTORY:  Oncology History  Malignant neoplasm of upper-outer quadrant of right breast in female, estrogen receptor negative ([place])  02/02/2020 Initial Diagnosis   Right breast lump tenderness. Diagnostic mammogram and [place] on 02/01/20 showed a 2.0cm mass at the 10 o'clock position with no axillary adenopathy. 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, ER/PR negative, Ki67 80%   02/08/2020 Cancer Staging   Staging form: Breast, AJCC 8th Edition - Clinical stage from 02/08/2020: Stage IB (cT1c, cN0, cM0, G3, ER-, PR-, HER2-) - Signed by Nicholas Lose, MD on 02/08/2020   02/23/2020 -  Chemotherapy   The patient had dexamethasone (DECADRON) 4 MG tablet, 1 of 1 cycle, Start date: 02/08/2020, End date: 04/22/2020 DOXOrubicin (ADRIAMYCIN) chemo injection 98 mg, 60 mg/m2 = 98 mg, Intravenous,  Once, 4 of 4 cycles Administration: 98 mg (02/23/2020), 98 mg (03/08/2020), 98 mg (03/22/2020), 98 mg (04/05/2020) palonosetron (ALOXI) injection 0.25 mg, 0.25 mg, Intravenous,  Once, 7 of 8 cycles Administration: 0.25 mg (02/23/2020), 0.25 mg (04/22/2020), 0.25 mg (03/08/2020), 0.25 mg (03/22/2020), 0.25 mg (04/05/2020), 0.25 mg (05/17/2020), 0.25 mg (06/07/2020) pegfilgrastim (NEULASTA ONPRO KIT) injection 6 mg, 6 mg, Subcutaneous, Once, 4 of 4 cycles Administration: 6 mg (02/23/2020), 6 mg (03/08/2020), 6 mg (03/22/2020), 6 mg (04/05/2020) CARBOplatin (PARAPLATIN) 630 mg in sodium chloride 0.9 % 250 mL chemo infusion, 700 mg (100 % of original dose 700 mg), Intravenous,  Once, 3 of 3 cycles Dose modification: 700 mg (original dose 700 mg, Cycle 5), 637.2 mg (original dose 700 mg, Cycle 6, Reason: Change in SCr/CrCl),   (original dose 700 mg, Cycle 6, Reason: Dose not tolerated) Administration: 630 mg (04/22/2020), 530 mg (05/17/2020), 530 mg (06/07/2020) cyclophosphamide (CYTOXAN) 980 mg in sodium chloride 0.9 % 250 mL chemo infusion, 600 mg/m2 = 980 mg, Intravenous,  Once, 4 of 4  cycles Administration: 980 mg (02/23/2020), 980 mg (03/08/2020), 980 mg (03/22/2020), 980 mg (04/05/2020) PACLitaxel (TAXOL) 132 mg in sodium chloride 0.9 % 250 mL chemo infusion (</= 6m/m2), 80 mg/m2 = 132 mg, Intravenous,  Once, 3 of 4 cycles Dose modification: 60 mg/m2 (original dose 80 mg/m2, Cycle 5, Reason: Dose not tolerated), 45 mg/m2 (original dose 80 mg/m2, Cycle 7, Reason: Provider Judgment) Administration: 132 mg (04/22/2020), 132 mg (05/03/2020), 96 mg (05/09/2020), 96 mg (05/17/2020), 96 mg (05/24/2020), 96 mg (05/31/2020), 96 mg (06/07/2020), 72 mg (06/14/2020) fosaprepitant (EMEND) 150 mg in sodium chloride 0.9 % 145 mL IVPB, 150 mg, Intravenous,  Once, 7 of 8 cycles Administration: 150 mg (02/23/2020), 150 mg (04/22/2020), 150 mg (03/08/2020), 150 mg (03/22/2020), 150 mg (04/05/2020), 150 mg (05/17/2020), 150 mg (06/07/2020)  for chemotherapy treatment.     Genetic Testing   Negative genetic testing. No pathogenic variants identified on the [facility] Common Hereditary Cancers Panel. The report date is 02/25/2020.   The Common Hereditary Cancers Panel offered by [facility] includes sequencing and/or deletion duplication testing of the following 48 genes: APC, ATM, AXIN2, BARD1, BMPR1A, BRCA1, BRCA2, BRIP1, CDH1, CDKN2A (p14ARF), CDKN2A (p16INK4a), CKD4, CHEK2, CTNNA1, DICER1, EPCAM (Deletion/duplication testing only), GREM1 (promoter region deletion/duplication testing only), KIT, MEN1, MLH1, MSH2, MSH3, MSH6, MUTYH, NBN, NF1, NHTL1, PALB2, PDGFRA, PMS2, POLD1, POLE, PTEN, RAD50, RAD51C, RAD51D, RNF43, SDHB, SDHC, SDHD, SMAD4, SMARCA4. STK11, TP53, TSC1, TSC2, and VHL.  The following genes were evaluated for sequence changes only: SDHA and HOXB13 c.251G>A variant only.   08/06/2020 Surgery   Bilateral mastectomies ([place]): no residual invasive carcinoma in the right breast and  no evidence of malignancy on the left breast, with 2 right axillary lymph nodes negative for carcinoma.   08/06/2020 Cancer Staging    Staging form: Breast, AJCC 8th Edition - Pathologic stage from 08/06/2020: No Stage Recommended (ypT0, pN0, cM0) - Signed by Gardenia Phlegm, NP on 11/22/2020     INTERVAL HISTORY:  Ms. Gow to review her survivorship care plan detailing her treatment course for breast cancer, as well as monitoring long-term side effects of that treatment, education regarding health maintenance, screening, and overall wellness and health promotion.     Overall, Ms. Belnap reports feeling quite well.  She has no new concerns today other than some thinking difficulty since receiving her chemotherapy.   REVIEW OF SYSTEMS:  Review of Systems  Constitutional: Negative for appetite change, chills, fatigue, fever and unexpected weight change.  HENT:   Negative for hearing loss, lump/mass and trouble swallowing.   Eyes: Negative for eye problems and icterus.  Respiratory: Negative for chest tightness, cough and shortness of breath.   Cardiovascular: Negative for chest pain, leg swelling and palpitations.  Gastrointestinal: Negative for abdominal distention, abdominal pain, constipation, diarrhea, nausea and vomiting.  Endocrine: Negative for hot flashes.  Genitourinary: Negative for difficulty urinating.   Musculoskeletal: Negative for arthralgias.  Skin: Negative for itching and rash.  Neurological: Negative for dizziness, extremity weakness, headaches and numbness.  Hematological: Negative for adenopathy. Does not bruise/bleed easily.  Psychiatric/Behavioral: Negative for depression. The patient is not nervous/anxious.    Breast: Denies any new nodularity, masses, tenderness, nipple changes, or nipple discharge.      ONCOLOGY TREATMENT TEAM:  1. Surgeon:  Dr. Brantley Stage at [facility] 2. Medical Oncologist: Dr. Lindi Adie  3. Plastic Surgeon: Dr. Iran Planas    PAST MEDICAL/SURGICAL HISTORY:  Past Medical History:  Diagnosis Date  . Anorexia   . Anxiety   . Arthritis    knees   . Cancer [facility])    R Breast Cancer 2021  . Family history of breast cancer   . Family history of lung cancer   . Family history of pancreatic cancer   . Fibroids   . Wears glasses    Past Surgical History:  Procedure Laterality Date  . BREAST RECONSTRUCTION WITH PLACEMENT OF TISSUE EXPANDER AND ALLODERM Bilateral 08/06/2020   Procedure: BILATERAL BREAST RECONSTRUCTION WITH PLACEMENT OF TISSUE EXPANDER AND ALLODERM;  Surgeon: Irene Limbo, MD;  Location: [place];  Service: Plastics;  Laterality: Bilateral;  . CLEFT PALATE REPAIR    . CLEFT PALATE REPAIR  1970  . COLONOSCOPY WITH PROPOFOL N/A 04/25/2018   Procedure: COLONOSCOPY WITH PROPOFOL;  Surgeon: [name], MD;  Location: [place];  Service: Endoscopy;  Laterality: N/A;  . NIPPLE SPARING MASTECTOMY WITH SENTINEL LYMPH NODE BIOPSY Bilateral 08/06/2020   Procedure: BILATERAL NIPPLE SPARING MASTECTOMIES WITH RIGHT AXILLARY  SENTINEL LYMPH NODE MAPPING;  Surgeon: Erroll Luna, MD;  Location: [place];  Service: General;  Laterality: Bilateral;  PECTORAL BLOCK  . PORT-A-CATH REMOVAL Right 08/06/2020   Procedure: REMOVAL PORT-A-CATH;  Surgeon: Erroll Luna, MD;  Location: [place];  Service: General;  Laterality: Right;  . PORTACATH PLACEMENT Right 02/22/2020   Procedure: INSERTION PORT-A-CATH WITH ULTRASOUND GUIDANCE;  Surgeon: Erroll Luna, MD;  Location: [facility];  Service: General;  Laterality: Right;  . REMOVAL OF TISSUE EXPANDER AND PLACEMENT OF IMPLANT Bilateral 08/25/2020   Procedure: REMOVAL OF RIGHT BREAST TISSUE EXPANDER; DEBRIDEMENT BILATERAL MASTECTOMY FLAP NIPPLES;  Surgeon: Irene Limbo, MD;  Location: [place];  Service: Plastics;  Laterality: Bilateral;     ALLERGIES:  No Known Allergies   CURRENT MEDICATIONS:  Outpatient Encounter Medications as of 11/22/2020  Medication Sig  . acetaminophen (TYLENOL) 325 MG tablet Take 650 mg by mouth every 6  (six) hours as needed for mild pain.  Marland Kitchen escitalopram (LEXAPRO) 20 MG tablet Take 1 tablet (20 mg total) by mouth daily. Please schedule an office visit before anymore refills.  Marland Kitchen ibuprofen (ADVIL) 800 MG tablet Take 800 mg by mouth every 8 (eight) hours as needed.  Marland Kitchen omeprazole (PRILOSEC) 20 MG capsule Take 20 mg by mouth daily as needed.   No facility-administered encounter medications on file as of 11/22/2020.     ONCOLOGIC FAMILY HISTORY:  Family History  Problem Relation Age of Onset  . Anxiety disorder Mother   . Heart disease Father 39       CABG x4  . Diabetes Father   . Hypertension Father   . Hypothyroidism Father   . Anxiety disorder Daughter   . Heart disease Maternal Grandmother   . Stroke Maternal Grandmother 68  . Heart disease Paternal Aunt   . Stroke Paternal Aunt   . Heart disease Paternal Uncle   . Pancreatic cancer Paternal Uncle        dx 28s  . Hypertension Sister   . Hyperlipidemia Sister   . Hypothyroidism Sister   . Lung cancer Maternal Uncle   . Healthy Son   . Breast cancer Cousin   . Lung cancer Paternal Aunt   . Colon cancer Neg Hx   . Ovarian cancer Neg Hx   . Cervical cancer Neg Hx      GENETIC COUNSELING/TESTING:see above  SOCIAL HISTORY:  Social History   Socioeconomic History  . Marital status: Married    Spouse name: Luretha Rued  . Number of children: 2  . Years of education: 16  . Highest education level: Bachelor's degree (e.g., BA, AB, BS)  Occupational History  . Occupation: works on [employer]  Tobacco Use  . Smoking status: Never Smoker  . Smokeless tobacco: Never Used  Vaping Use  . Vaping Use: Never used  Substance and Sexual Activity  . Alcohol use: No    Alcohol/week: 0.0 standard drinks  . Drug use: No  . Sexual activity: Yes    Partners: Male    Birth control/protection: Surgical    Comment: husband s/p vasectomy  Other Topics Concern  . Not on file  Social History Narrative  . Not on file    Social Determinants of Health   Financial Resource Strain: Not on file  Food Insecurity: Not on file  Transportation Needs: Not on file  Physical Activity: Not on file  Stress: Not on file  Social Connections: Not on file  Intimate Partner Violence: Not on file     OBSERVATIONS/OBJECTIVE:  BP 96/64 (BP Location: Left Arm, Patient Position: Sitting)   Pulse (!) 102   Temp 98.1 F (36.7 C) (Tympanic)   Resp 18   Ht '5\' 1"'  (1.549 m)   Wt 139 lb 6.4 oz (63.2 kg)   LMP 06/24/2019 (Approximate)   SpO2 100%   BMI 26.34 kg/m  GENERAL: Patient is a well appearing female in no acute distress HEENT:  Sclerae anicteric.  Oropharynx clear and moist. No ulcerations or evidence of oropharyngeal candidiasis. Neck is supple.  NODES:  No cervical, supraclavicular, or axillary lymphadenopathy palpated.  BREAST EXAM:  Right breast s/p  mastectomy, no sign of local recurrence, left breast s/p mastectomy and expander in place, benign LUNGS:  Clear to auscultation bilaterally.  No wheezes or rhonchi. HEART:  Regular rate and rhythm. No murmur appreciated. ABDOMEN:  Soft, nontender.  Positive, normoactive bowel sounds. No organomegaly palpated. MSK:  No focal spinal tenderness to palpation. Full range of motion bilaterally in the upper extremities. EXTREMITIES:  No peripheral edema.   SKIN:  Clear with no obvious rashes or skin changes. No nail dyscrasia. NEURO:  Nonfocal. Well oriented.  Appropriate affect.    LABORATORY DATA:  None for this visit.  DIAGNOSTIC IMAGING:  None for this visit.      ASSESSMENT AND PLAN:  Ms.. Virginia [name] is a pleasant 55 y.o. female with Stage IB right breast invasive ductal carcinoma, ER-/PR-/HER2-, diagnosed in 01/2020, treated with neaodjuvant chemotherapy and bilateral mastectomies.  She presents to the Survivorship Clinic for our initial meeting and routine follow-up post-completion of treatment for breast cancer.    1. Stage IB right breast cancer:  Ms.  Starlin is continuing to recover from definitive treatment for breast cancer. She will follow-up with her medical oncologist, Dr. Lindi Adie in 6 months with history and physical exam per surveillance protocol. Today, a comprehensive survivorship care plan and treatment summary was reviewed with the patient today detailing her breast cancer diagnosis, treatment course, potential late/long-term effects of treatment, appropriate follow-up care with recommendations for the future, and patient education resources.  A copy of this summary, along with a letter will be sent to the patient's primary care provider via mail/fax/In Basket message after today's visit.    2. Cognitive changes secondary to treatment: Crissa notes she is increasingly forgetful.  I reviewed with her that Dr. Mickeal Skinner, our neuro oncologist often helps with these issues.  She will let me know if she would like a referral.    3. Bone health:   She was given education on specific activities to promote bone health.  4. Cancer screening:  Due to Ms. Oo's history and her age, she should receive screening for skin cancers, colon cancer, and gynecologic cancers.  The information and recommendations are listed on the patient's comprehensive care plan/treatment summary and were reviewed in detail with the patient.    5. Health maintenance and wellness promotion: Ms. Lanius was encouraged to consume 5-7 servings of fruits and vegetables per day. We reviewed the "Nutrition Rainbow" handout, as well as the handout "Take Control of Your Health and Reduce Your Cancer Risk" from the [employer].  She was also encouraged to engage in moderate to vigorous exercise for 30 minutes per day most days of the week. We discussed the [employer] fitness program, which is designed for cancer survivors to help them become more physically fit after cancer treatments.  She was instructed to limit her alcohol consumption and continue to abstain from  tobacco use.     6. Support services/counseling: It is not uncommon for this period of the patient's cancer care trajectory to be one of many emotions and stressors.  We discussed how this can be increasingly difficult during the times of quarantine and social distancing due to the COVID-19 pandemic.   She was given information regarding our available services and encouraged to contact me with any questions or for help enrolling in any of our support group/programs.    Follow up instructions:    -Return to cancer center in 6 months for f/u with Dr. Lindi Adie  -She is welcome to return back  to the Survivorship Clinic at any time; no additional follow-up needed at this time.  -Consider referral back to survivorship as a long-term survivor for continued surveillance  The patient was provided an opportunity to ask questions and all were answered. The patient agreed with the plan and demonstrated an understanding of the instructions.   Total encounter time: 30 minutes*  Wilber Bihari, NP 11/22/20 1:22 PM Medical Oncology and Hematology [facility] [address], [place] [address] Tel. [phone]    Fax. [phone]  *Total Encounter Time as defined by the Centers for Medicare and Medicaid Services includes, in addition to the face-to-face time of a patient visit (documented in the note above) non-face-to-face time: obtaining and reviewing outside history, ordering and reviewing medications, tests or procedures, care coordination (communications with other health care professionals or caregivers) and documentation in the medical record.

## 2020-11-26 ENCOUNTER — Encounter (HOSPITAL_BASED_OUTPATIENT_CLINIC_OR_DEPARTMENT_OTHER): Payer: Self-pay | Admitting: Plastic Surgery

## 2020-11-26 ENCOUNTER — Ambulatory Visit (HOSPITAL_BASED_OUTPATIENT_CLINIC_OR_DEPARTMENT_OTHER): Payer: 59 | Admitting: Certified Registered"

## 2020-11-26 ENCOUNTER — Encounter (HOSPITAL_BASED_OUTPATIENT_CLINIC_OR_DEPARTMENT_OTHER)
Admission: RE | Disposition: A | Discharge status: Home / Self Care | Payer: Self-pay | Source: Home / Self Care | Attending: Plastic Surgery

## 2020-11-26 ENCOUNTER — Other Ambulatory Visit: Payer: Self-pay

## 2020-11-26 ENCOUNTER — Ambulatory Visit (HOSPITAL_BASED_OUTPATIENT_CLINIC_OR_DEPARTMENT_OTHER)
Admission: RE | Admit: 2020-11-26 | Discharge: 2020-11-26 | Disposition: A | Discharge status: Home / Self Care | Payer: 59 | Attending: Plastic Surgery | Admitting: Plastic Surgery

## 2020-11-26 DIAGNOSIS — Z421 Encounter for breast reconstruction following mastectomy: Secondary | ICD-10-CM | POA: Insufficient documentation

## 2020-11-26 DIAGNOSIS — Z9013 Acquired absence of bilateral breasts and nipples: Secondary | ICD-10-CM | POA: Insufficient documentation

## 2020-11-26 DIAGNOSIS — Z9221 Personal history of antineoplastic chemotherapy: Secondary | ICD-10-CM | POA: Insufficient documentation

## 2020-11-26 DIAGNOSIS — Z853 Personal history of malignant neoplasm of breast: Secondary | ICD-10-CM | POA: Insufficient documentation

## 2020-11-26 DIAGNOSIS — Z803 Family history of malignant neoplasm of breast: Secondary | ICD-10-CM | POA: Diagnosis not present

## 2020-11-26 HISTORY — PX: BREAST RECONSTRUCTION WITH PLACEMENT OF TISSUE EXPANDER AND ALLODERM: SHX6805

## 2020-11-26 SURGERY — BREAST RECONSTRUCTION WITH PLACEMENT OF TISSUE EXPANDER AND ALLODERM
Anesthesia: General | Site: Breast | Laterality: Right

## 2020-11-26 MED ORDER — FENTANYL CITRATE (PF) 100 MCG/2ML IJ SOLN
INTRAMUSCULAR | Status: AC
  Filled 2020-11-26: qty 2

## 2020-11-26 MED ORDER — MIDAZOLAM HCL 2 MG/2ML IJ SOLN
INTRAMUSCULAR | Status: AC
  Filled 2020-11-26: qty 2

## 2020-11-26 MED ORDER — ONDANSETRON HCL 4 MG/2ML IJ SOLN
INTRAMUSCULAR | Status: AC
  Filled 2020-11-26: qty 2

## 2020-11-26 MED ORDER — CEFAZOLIN SODIUM-DEXTROSE 2-4 GM/100ML-% IV SOLN
INTRAVENOUS | Status: AC
  Filled 2020-11-26: qty 100

## 2020-11-26 MED ORDER — MIDAZOLAM HCL 2 MG/2ML IJ SOLN
INTRAMUSCULAR | Status: DC | PRN
  Administered 2020-11-26: 2 mg via INTRAVENOUS

## 2020-11-26 MED ORDER — ROCURONIUM BROMIDE 10 MG/ML (PF) SYRINGE
PREFILLED_SYRINGE | INTRAVENOUS | Status: AC
  Filled 2020-11-26: qty 10

## 2020-11-26 MED ORDER — CHLORHEXIDINE GLUCONATE CLOTH 2 % EX PADS: 6.0000 | MEDICATED_PAD | Freq: Once | CUTANEOUS | Status: DC

## 2020-11-26 MED ORDER — LACTATED RINGERS IV SOLN: INTRAVENOUS | Status: DC

## 2020-11-26 MED ORDER — PROPOFOL 10 MG/ML IV BOLUS
INTRAVENOUS | Status: DC | PRN
  Administered 2020-11-26: 150 mg via INTRAVENOUS

## 2020-11-26 MED ORDER — LIDOCAINE 2% (20 MG/ML) 5 ML SYRINGE
INTRAMUSCULAR | Status: AC
  Filled 2020-11-26: qty 5

## 2020-11-26 MED ORDER — FENTANYL CITRATE (PF) 100 MCG/2ML IJ SOLN
INTRAMUSCULAR | Status: DC | PRN
  Administered 2020-11-26: 50 ug via INTRAVENOUS

## 2020-11-26 MED ORDER — ONDANSETRON HCL 4 MG/2ML IJ SOLN
INTRAMUSCULAR | Status: DC | PRN
  Administered 2020-11-26: 4 mg via INTRAVENOUS

## 2020-11-26 MED ORDER — SODIUM CHLORIDE 0.9 % IV SOLN
INTRAVENOUS | Status: AC | PRN
  Administered 2020-11-26: 500 mL via INTRAMUSCULAR

## 2020-11-26 MED ORDER — LACTATED RINGERS IV SOLN: INTRAVENOUS | Status: DC | PRN

## 2020-11-26 MED ORDER — KETAMINE HCL 100 MG/ML IJ SOLN
INTRAMUSCULAR | Status: DC | PRN
  Administered 2020-11-26 (×2): 20 mg via INTRAVENOUS

## 2020-11-26 MED ORDER — CEFAZOLIN SODIUM-DEXTROSE 2-4 GM/100ML-% IV SOLN: 2.0000 g | INTRAVENOUS | Status: DC

## 2020-11-26 MED ORDER — ACETAMINOPHEN 500 MG PO TABS
ORAL_TABLET | ORAL | Status: AC
  Filled 2020-11-26: qty 2

## 2020-11-26 MED ORDER — SODIUM CHLORIDE 0.9 % IV SOLN
INTRAVENOUS | Status: AC
  Filled 2020-11-26: qty 10

## 2020-11-26 MED ORDER — DEXAMETHASONE SODIUM PHOSPHATE 10 MG/ML IJ SOLN
INTRAMUSCULAR | Status: AC
  Filled 2020-11-26: qty 1

## 2020-11-26 MED ORDER — PROPOFOL 10 MG/ML IV BOLUS
INTRAVENOUS | Status: AC
  Filled 2020-11-26: qty 40

## 2020-11-26 MED ORDER — KETAMINE HCL 100 MG/ML IJ SOLN
INTRAMUSCULAR | Status: AC
  Filled 2020-11-26: qty 1

## 2020-11-26 MED ORDER — DEXAMETHASONE SODIUM PHOSPHATE 10 MG/ML IJ SOLN
INTRAMUSCULAR | Status: DC | PRN
  Administered 2020-11-26: 5 mg via INTRAVENOUS

## 2020-11-26 MED ORDER — GABAPENTIN 300 MG PO CAPS
ORAL_CAPSULE | ORAL | Status: AC
  Filled 2020-11-26: qty 1

## 2020-11-26 MED ORDER — CEFAZOLIN SODIUM-DEXTROSE 2-3 GM-%(50ML) IV SOLR
INTRAVENOUS | Status: DC | PRN
  Administered 2020-11-26: 2 g via INTRAVENOUS

## 2020-11-26 MED ORDER — EPHEDRINE SULFATE 50 MG/ML IJ SOLN
INTRAMUSCULAR | Status: DC | PRN
  Administered 2020-11-26 (×2): 15 mg via INTRAVENOUS

## 2020-11-26 MED ORDER — LIDOCAINE-EPINEPHRINE 1 %-1:100000 IJ SOLN
INTRAMUSCULAR | Status: AC
  Filled 2020-11-26: qty 1

## 2020-11-26 MED ORDER — CELECOXIB 200 MG PO CAPS
ORAL_CAPSULE | ORAL | Status: AC
  Filled 2020-11-26: qty 1

## 2020-11-26 MED ORDER — GABAPENTIN 300 MG PO CAPS
300.0000 mg | ORAL_CAPSULE | ORAL | Status: AC
  Administered 2020-11-26: 300 mg via ORAL

## 2020-11-26 MED ORDER — POVIDONE-IODINE 10 % EX SOLN
CUTANEOUS | Status: DC | PRN
  Administered 2020-11-26: 1 via TOPICAL

## 2020-11-26 MED ORDER — LIDOCAINE HCL (CARDIAC) PF 100 MG/5ML IV SOSY
PREFILLED_SYRINGE | INTRAVENOUS | Status: DC | PRN
  Administered 2020-11-26: 60 mg via INTRAVENOUS

## 2020-11-26 MED ORDER — CELECOXIB 200 MG PO CAPS
200.0000 mg | ORAL_CAPSULE | ORAL | Status: AC
  Administered 2020-11-26: 200 mg via ORAL

## 2020-11-26 MED ORDER — FENTANYL CITRATE (PF) 100 MCG/2ML IJ SOLN
25.0000 ug | INTRAMUSCULAR | Status: DC | PRN
  Administered 2020-11-26 (×2): 25 ug via INTRAVENOUS

## 2020-11-26 MED ORDER — ACETAMINOPHEN 500 MG PO TABS
1000.0000 mg | ORAL_TABLET | ORAL | Status: AC
  Administered 2020-11-26: 1000 mg via ORAL

## 2020-11-26 MED ORDER — BUPIVACAINE-EPINEPHRINE (PF) 0.25% -1:200000 IJ SOLN
INTRAMUSCULAR | Status: DC | PRN
  Administered 2020-11-26: 20 mL via PERINEURAL

## 2020-11-26 MED ORDER — LIDOCAINE-EPINEPHRINE 2 %-1:100000 IJ SOLN
INTRAMUSCULAR | Status: AC
  Filled 2020-11-26: qty 1

## 2020-11-26 MED ORDER — SODIUM CHLORIDE 0.9 % IV SOLN
INTRAVENOUS | Status: DC | PRN
  Administered 2020-11-26: 500 mL

## 2020-11-26 SURGICAL SUPPLY — 69 items
ALLOGRAFT PERF 16X20 1.6+/-0.4 (Tissue) ×2 IMPLANT
BAG DECANTER FOR FLEXI CONT (MISCELLANEOUS) ×2 IMPLANT
BINDER BREAST LRG (GAUZE/BANDAGES/DRESSINGS) IMPLANT
BINDER BREAST MEDIUM (GAUZE/BANDAGES/DRESSINGS) ×2 IMPLANT
BINDER BREAST XLRG (GAUZE/BANDAGES/DRESSINGS) IMPLANT
BINDER BREAST XXLRG (GAUZE/BANDAGES/DRESSINGS) IMPLANT
BLADE SURG 10 STRL SS (BLADE) ×2 IMPLANT
BLADE SURG 15 STRL LF DISP TIS (BLADE) ×1 IMPLANT
BLADE SURG 15 STRL SS (BLADE) ×1
BNDG GAUZE ELAST 4 BULKY (GAUZE/BANDAGES/DRESSINGS) IMPLANT
CANISTER SUCT 1200ML W/VALVE (MISCELLANEOUS) ×2 IMPLANT
CHLORAPREP W/TINT 26 (MISCELLANEOUS) ×4 IMPLANT
COUNTER NEEDLE 1200 MAGNETIC (NEEDLE) IMPLANT
COVER BACK TABLE 60X90IN (DRAPES) ×2 IMPLANT
COVER MAYO STAND STRL (DRAPES) ×4 IMPLANT
DERMABOND ADVANCED (GAUZE/BANDAGES/DRESSINGS) ×1
DERMABOND ADVANCED .7 DNX12 (GAUZE/BANDAGES/DRESSINGS) ×1 IMPLANT
DRAIN CHANNEL 15F RND FF W/TCR (WOUND CARE) IMPLANT
DRAIN CHANNEL 19F RND (DRAIN) ×2 IMPLANT
DRAPE INCISE IOBAN 66X45 STRL (DRAPES) IMPLANT
DRAPE TOP ARMCOVERS (MISCELLANEOUS) ×2 IMPLANT
DRAPE U-SHAPE 76X120 STRL (DRAPES) ×2 IMPLANT
DRAPE UTILITY XL STRL (DRAPES) ×2 IMPLANT
DRSG PAD ABDOMINAL 8X10 ST (GAUZE/BANDAGES/DRESSINGS) ×4 IMPLANT
DRSG TEGADERM 2-3/8X2-3/4 SM (GAUZE/BANDAGES/DRESSINGS) IMPLANT
DRSG TEGADERM 4X10 (GAUZE/BANDAGES/DRESSINGS) IMPLANT
ELECT BLADE 4.0 EZ CLEAN MEGAD (MISCELLANEOUS) ×2
ELECT COATED BLADE 2.86 ST (ELECTRODE) ×2 IMPLANT
ELECTRODE BLDE 4.0 EZ CLN MEGD (MISCELLANEOUS) ×1 IMPLANT
EVACUATOR SILICONE 100CC (DRAIN) ×2 IMPLANT
EXPANDER TISSUE FV FOURTE 400 (Prosthesis & Implant Plastic) ×1 IMPLANT
GLOVE SURG ENC MOIS LTX SZ6 (GLOVE) ×4 IMPLANT
GLOVE SURG ENC MOIS LTX SZ6.5 (GLOVE) ×2 IMPLANT
GLOVE SURG UNDER POLY LF SZ6.5 (GLOVE) ×2 IMPLANT
GOWN STRL REUS W/ TWL LRG LVL3 (GOWN DISPOSABLE) ×1 IMPLANT
GOWN STRL REUS W/TWL LRG LVL3 (GOWN DISPOSABLE) ×1
KIT FILL SYSTEM UNIVERSAL (SET/KITS/TRAYS/PACK) ×2 IMPLANT
MARKER SKIN DUAL TIP RULER LAB (MISCELLANEOUS) IMPLANT
NDL SAFETY ECLIPSE 18X1.5 (NEEDLE) ×1 IMPLANT
NEEDLE HYPO 18GX1.5 SHARP (NEEDLE) ×1
NEEDLE HYPO 25X1 1.5 SAFETY (NEEDLE) ×2 IMPLANT
PENCIL SMOKE EVACUATOR (MISCELLANEOUS) ×2 IMPLANT
PIN SAFETY STERILE (MISCELLANEOUS) ×2 IMPLANT
PUNCH BIOPSY DERMAL 4MM (MISCELLANEOUS) IMPLANT
SHEET MEDIUM DRAPE 40X70 STRL (DRAPES) ×2 IMPLANT
SPONGE LAP 18X18 RF (DISPOSABLE) ×4 IMPLANT
STAPLER VISISTAT 35W (STAPLE) ×2 IMPLANT
SUT CHROMIC 4 0 PS 2 18 (SUTURE) ×6 IMPLANT
SUT ETHILON 2 0 FS 18 (SUTURE) ×2 IMPLANT
SUT MNCRL AB 4-0 PS2 18 (SUTURE) ×2 IMPLANT
SUT PDS AB 2-0 CT2 27 (SUTURE) IMPLANT
SUT SILK 2 0 SH (SUTURE) IMPLANT
SUT VIC AB 3-0 SH 27 (SUTURE) ×1
SUT VIC AB 3-0 SH 27X BRD (SUTURE) ×1 IMPLANT
SUT VICRYL 0 CT-2 (SUTURE) ×2 IMPLANT
SUT VICRYL 4-0 PS2 18IN ABS (SUTURE) ×2 IMPLANT
SUT VLOC 180 0 24IN GS25 (SUTURE) ×2 IMPLANT
SYR 10ML LL (SYRINGE) ×2 IMPLANT
SYR 50ML LL SCALE MARK (SYRINGE) ×2 IMPLANT
SYR BULB IRRIG 60ML STRL (SYRINGE) ×2 IMPLANT
SYR CONTROL 10ML LL (SYRINGE) IMPLANT
TAPE MEASURE VINYL STERILE (MISCELLANEOUS) IMPLANT
TISSUE EXPNDR FV FOURTE 400 (Prosthesis & Implant Plastic) ×2 IMPLANT
TOWEL GREEN STERILE FF (TOWEL DISPOSABLE) ×2 IMPLANT
TRAY DSU PREP LF (CUSTOM PROCEDURE TRAY) ×2 IMPLANT
TRAY FOLEY W/BAG SLVR 14FR LF (SET/KITS/TRAYS/PACK) IMPLANT
TUBE CONNECTING 20X1/4 (TUBING) ×2 IMPLANT
UNDERPAD 30X36 HEAVY ABSORB (UNDERPADS AND DIAPERS) ×4 IMPLANT
YANKAUER SUCT BULB TIP NO VENT (SUCTIONS) ×2 IMPLANT

## 2020-11-26 NOTE — Discharge Instructions (Signed)
Next dose of Tylenol can be taken today at 230pm. Next dose of Ibuprofen can be taken today at 230pm.   Post Anesthesia Home Care Instructions  Activity: Get plenty of rest for the remainder of the day. A responsible individual must stay with you for 24 hours following the procedure.  For the next 24 hours, DO NOT: -Drive a car -Paediatric nurse -Drink alcoholic beverages -Take any medication unless instructed by your physician -Make any legal decisions or sign important papers.  Meals: Start with liquid foods such as gelatin or soup. Progress to regular foods as tolerated. Avoid greasy, spicy, heavy foods. If nausea and/or vomiting occur, drink only clear liquids until the nausea and/or vomiting subsides. Call your physician if vomiting continues.  Special Instructions/Symptoms: Your throat may feel dry or sore from the anesthesia or the breathing tube placed in your throat during surgery. If this causes discomfort, gargle with warm salt water. The discomfort should disappear within 24 hours.  If you had a scopolamine patch placed behind your ear for the management of post- operative nausea and/or vomiting:  1. The medication in the patch is effective for 72 hours, after which it should be removed.  Wrap patch in a tissue and discard in the trash. Wash hands thoroughly with soap and water. 2. You may remove the patch earlier than 72 hours if you experience unpleasant side effects which may include dry mouth, dizziness or visual disturbances. 3. Avoid touching the patch. Wash your hands with soap and water after contact with the patch.    About my Jackson-Pratt Bulb Drain  What is a Jackson-Pratt bulb? A Jackson-Pratt is a soft, round device used to collect drainage. It is connected to a long, thin drainage catheter, which is held in place by one or two small stiches near your surgical incision site. When the bulb is squeezed, it forms a vacuum, forcing the drainage to empty into the  bulb.  Emptying the Jackson-Pratt bulb- To empty the bulb: 1. Release the plug on the top of the bulb. 2. Pour the bulb's contents into a measuring container which your nurse will provide. 3. Record the time emptied and amount of drainage. Empty the drain(s) as often as your     doctor or nurse recommends.  Date                  Time                    Amount (Drain 1)                 Amount (Drain 2)  _____________________________________________________________________  _____________________________________________________________________  _____________________________________________________________________  _____________________________________________________________________  _____________________________________________________________________  _____________________________________________________________________  _____________________________________________________________________  _____________________________________________________________________  Squeezing the Jackson-Pratt Bulb- To squeeze the bulb: 1. Make sure the plug at the top of the bulb is open. 2. Squeeze the bulb tightly in your fist. You will hear air squeezing from the bulb. 3. Replace the plug while the bulb is squeezed. 4. Use a safety pin to attach the bulb to your clothing. This will keep the catheter from     pulling at the bulb insertion site.  When to call your doctor- Call your doctor if:  Drain site becomes red, swollen or hot.  You have a fever greater than 101 degrees F.  There is oozing at the drain site.  Drain falls out (apply a guaze bandage over the drain hole and secure it with tape).  Drainage increases daily not related  to activity patterns. (You will usually have more drainage when you are active than when you are resting.)  Drainage has a bad odor.

## 2020-11-26 NOTE — Transfer of Care (Signed)
Immediate Anesthesia Transfer of Care Note  Patient: Virginia [name]  Procedure(s) Performed: BREAST RECONSTRUCTION WITH PLACEMENT OF TISSUE EXPANDER AND ALLODERM (Right Breast)  Patient Location: PACU  Anesthesia Type:General  Level of Consciousness: sedated  Airway & Oxygen Therapy: Patient Spontanous Breathing and Patient connected to face mask oxygen  Post-op Assessment: Report given to RN and Post -op Vital signs reviewed and stable  Post vital signs: Reviewed and stable  Last Vitals:  Vitals Value Taken Time  BP    Temp    Pulse    Resp    SpO2      Last Pain:  Vitals:   11/26/20 0822  TempSrc: Oral  PainSc: 0-No pain         Complications: No complications documented.

## 2020-11-26 NOTE — Op Note (Signed)
Operative Note   DATE OF OPERATION: 1.11.22  LOCATION:  [facility]  SURGICAL DIVISION: Plastic Surgery  PREOPERATIVE DIAGNOSES:  1. History breast cancer 2. Acquired absence breasts  POSTOPERATIVE DIAGNOSES:  same  PROCEDURE:  1. Right breast reconstruction with placement tissue expander 2. Acellular dermis (Allodermi) to right chest 300 cm2  SURGEON: Irene Limbo MD MBA  ASSISTANT: none  ANESTHESIA:  General.   EBL: 30 ml  COMPLICATIONS: None immediate.   INDICATIONS FOR PROCEDURE:  The patient, Virginia [name], is a 55 y.o. female born on [birth year], is here for right prepectoral tissue expander based reconstruction. Patient has a history nipple sparing mastectomy with immediate reconstruction. Course complicated by infection with removal expander.    FINDINGS: Natrelle 133S FV-12-T 400 ml tissue expander placed, initial fill volume 180 ml saline. SN [id number]  DESCRIPTION OF PROCEDURE:  The patient's operative site was marked with the patient in the preoperative area. The patient was taken to the operating room. SCDs were placed and IV antibiotics were given. The patient's operative site was prepped and draped in a sterile fashion. A time out was performed and all information was confirmed to be correct. Incision made in left inframammary fold scar and carried through superficial fascia to anterior surface pectoralis muscle. Dissection completed over pectoralis muscle to dimension of expander. The cavity was irrigated with saline solution containingAncef, betadine, and gentamicin. Hemostasis was ensured. A 19 Fr drain was placed in subcutaneous position laterally anda 15 Fr drain placed along inframammary fold. Eachsecured to skin with 2-0 nylon. The tissue expanderwasprepared on back table prior in insertion. The expander was filled with air.Perforated acellular dermis was draped over anterior surface expander. The ADM was then secured to itself over  posterior surface of expanderwith 4-0 chromic. Redundant folds acellular dermis excised so that the ADM layflat without folds over air filled expander.The expander was secured to medial insertion pectoralis with 0 vicryl.The lateral tab also secured to serratus muscle with 0 vicryl. The ADM was secured to pectoralis muscle and chest wall along inferior border at inframammary foldwith 0 V lock suture. Skin closure completedwith 3-0 vicryl in fascial layer and 4-0 vicryl in dermis. Skin closure completed with 4-0 monocryl subcuticular and tissue adhesive. The expander port was accessed and all air removed. Saline used to fill expander to initial fill volume 180 ml. Dry dressing and breast binder applied.   The patient was allowed to wake from anesthesia, extubated and taken to the recovery room in satisfactory condition.   SPECIMENS: none  DRAINS: 15 and 19 Fr JP in right breast reconstruction

## 2020-11-26 NOTE — Anesthesia Procedure Notes (Signed)
Procedure Name: LMA Insertion Performed by: Verita Lamb, CRNA Pre-anesthesia Checklist: Patient identified, Emergency Drugs available, Suction available and Patient being monitored Patient Re-evaluated:Patient Re-evaluated prior to induction Oxygen Delivery Method: Circle system utilized Preoxygenation: Pre-oxygenation with 100% oxygen Induction Type: IV induction Ventilation: Mask ventilation without difficulty LMA: LMA inserted LMA Size: 3.0 Number of attempts: 1 Airway Equipment and Method: Bite block Placement Confirmation: positive ETCO2,  CO2 detector and breath sounds checked- equal and bilateral Tube secured with: Tape Dental Injury: Teeth and Oropharynx as per pre-operative assessment

## 2020-11-26 NOTE — Anesthesia Preprocedure Evaluation (Addendum)
Anesthesia Evaluation  Patient identified by MRN, date of birth, ID band Patient awake    Reviewed: Allergy & Precautions, NPO status , Patient's Chart, lab work & pertinent test results  Airway Mallampati: I  TM Distance: >3 FB Neck ROM: Full    Dental  (+) Teeth Intact, Dental Advisory Given   Pulmonary neg pulmonary ROS,    Pulmonary exam normal breath sounds clear to auscultation       Cardiovascular negative cardio ROS Normal cardiovascular exam Rhythm:Regular Rate:Normal  TTE 2021 Normal EF, valves ok   Neuro/Psych PSYCHIATRIC DISORDERS Anxiety negative neurological ROS     GI/Hepatic Neg liver ROS, GERD  Medicated and Controlled,  Endo/Other  negative endocrine ROS  Renal/GU negative Renal ROS  negative genitourinary   Musculoskeletal  (+) Arthritis ,   Abdominal   Peds  Hematology negative hematology ROS (+)   Anesthesia Other Findings Right breast CA  Reproductive/Obstetrics                           Anesthesia Physical Anesthesia Plan  ASA: II  Anesthesia Plan: General   Post-op Pain Management:    Induction: Intravenous  PONV Risk Score and Plan: 3 and Midazolam, Dexamethasone and Ondansetron  Airway Management Planned: LMA  Additional Equipment:   Intra-op Plan:   Post-operative Plan: Extubation in OR  Informed Consent: I have reviewed the patients History and Physical, chart, labs and discussed the procedure including the risks, benefits and alternatives for the proposed anesthesia with the patient or authorized representative who has indicated his/her understanding and acceptance.     Dental advisory given  Plan Discussed with: CRNA  Anesthesia Plan Comments: (Ketamine for multimodal analgesia)       Anesthesia Quick Evaluation

## 2020-11-26 NOTE — Interval H&P Note (Signed)
History and Physical Interval Note:  11/26/2020 9:19 AM  Virginia [name]  has presented today for surgery, with the diagnosis of right breast cancer, acquired absence breasts.  The various methods of treatment have been discussed with the patient and family. After consideration of risks, benefits and other options for treatment, the patient has consented to  Procedure(s): BREAST RECONSTRUCTION WITH PLACEMENT OF TISSUE EXPANDER AND ALLODERM (Right) as a surgical intervention.  The patient's history has been reviewed, patient examined, no change in status, stable for surgery.  I have reviewed the patient's chart and labs.  Questions were answered to the patient's satisfaction.     Arnoldo Hooker [name]

## 2020-11-27 ENCOUNTER — Encounter (HOSPITAL_BASED_OUTPATIENT_CLINIC_OR_DEPARTMENT_OTHER): Payer: Self-pay | Admitting: Plastic Surgery

## 2020-11-28 NOTE — Anesthesia Postprocedure Evaluation (Signed)
Anesthesia Post Note  Patient: Virginia [name]  Procedure(s) Performed: BREAST RECONSTRUCTION WITH PLACEMENT OF TISSUE EXPANDER AND ALLODERM (Right Breast)     Patient location during evaluation: PACU Anesthesia Type: General Level of consciousness: awake and alert Pain management: pain level controlled Vital Signs Assessment: post-procedure vital signs reviewed and stable Respiratory status: spontaneous breathing, nonlabored ventilation, respiratory function stable and patient connected to nasal cannula oxygen Cardiovascular status: blood pressure returned to baseline and stable Postop Assessment: no apparent nausea or vomiting Anesthetic complications: no   No complications documented.  Last Vitals:  Vitals:   11/26/20 1245 11/26/20 1317  BP: 111/67 115/71  Pulse: 88 95  Resp: 16 16  Temp:    SpO2: 94% 95%    Last Pain:  Vitals:   11/26/20 1317  TempSrc:   PainSc: 2                  [name]

## 2020-12-08 NOTE — Addendum Note (Signed)
Addendum  created 12/08/20 1841 by Freddrick March, MD   Intraprocedure Staff edited

## 2020-12-18 ENCOUNTER — Encounter: Payer: Self-pay | Admitting: Diagnostic Neuroimaging

## 2020-12-18 ENCOUNTER — Ambulatory Visit (INDEPENDENT_AMBULATORY_CARE_PROVIDER_SITE_OTHER): Payer: 59 | Admitting: Diagnostic Neuroimaging

## 2020-12-18 VITALS — BP 101/71 | HR 92 | Ht 61.0 in | Wt 139.0 lb

## 2020-12-18 DIAGNOSIS — R200 Anesthesia of skin: Secondary | ICD-10-CM | POA: Diagnosis not present

## 2020-12-18 NOTE — Patient Instructions (Signed)
  Bilateral hand numbness  - suspect long standing carpal tunnel syndrome (since ~2017) with superimposed chemo-neuropathy since 2021  - recommend to try wrist splints at bedtime x 4-6 weeks (these helped patient in the past)  - if not improving, then may consider EMG/NCS (nerve testing)

## 2020-12-18 NOTE — Progress Notes (Signed)
[facility]  PATIENT: Virginia [name] DOB: 1966-06-16  REFERRING CLINICIAN: Virginia Crews, MD HISTORY FROM: patient  REASON FOR VISIT: new consult   HISTORICAL  CHIEF COMPLAINT:  Chief Complaint  Patient presents with  . Hand numbness    Rm 6 New Pt  "h/o carpal tunnel- while driving my hands go numb, arms and hands go to sleep, right arm weaker; also s/p dbl mastectomy and chemotherapy"    HISTORY OF PRESENT ILLNESS:   55 year old female with breast cancer here for evaluation of numbness and tingling.  Patient has had numbness and tingling in her hands since 2017, previously managed as mild carpal tunnel syndrome with response.  In 2021 patient was diagnosed with breast cancer treated with chemotherapy from March to July 2021.  During chemotherapy she noticed numbness and tingling increasing in her hands and feet.  She underwent bilateral mastectomy in September 2021.  Following this she felt some additional pressure in her arms.  Since that time patient continues to have numbness and tingling in her hands, which wakes her up from night, also affects her when she drives.   REVIEW OF SYSTEMS: Full 14 system review of systems performed and negative with exception of: As per HPI.  ALLERGIES: No Known Allergies  HOME MEDICATIONS: Outpatient Medications Prior to Visit  Medication Sig Dispense Refill  . escitalopram (LEXAPRO) 20 MG tablet Take 1 tablet (20 mg total) by mouth daily. Please schedule an office visit before anymore refills. 90 tablet 0  . ibuprofen (ADVIL) 400 MG tablet Take 400 mg by mouth every 6 (six) hours as needed.    Marland Kitchen omeprazole (PRILOSEC) 20 MG capsule Take 20 mg by mouth daily as needed.     No facility-administered medications prior to visit.    PAST MEDICAL HISTORY: Past Medical History:  Diagnosis Date  . Anorexia   . Anxiety   . Arthritis    knees  . Cancer [facility])    R Breast Cancer 2021  . Family history of  breast cancer   . Family history of lung cancer   . Family history of pancreatic cancer   . Fibroids   . Wears glasses     PAST SURGICAL HISTORY: Past Surgical History:  Procedure Laterality Date  . BREAST RECONSTRUCTION WITH PLACEMENT OF TISSUE EXPANDER AND ALLODERM Bilateral 08/06/2020   Procedure: BILATERAL BREAST RECONSTRUCTION WITH PLACEMENT OF TISSUE EXPANDER AND ALLODERM;  Surgeon: Irene Limbo, MD;  Location: [place];  Service: Plastics;  Laterality: Bilateral;  . BREAST RECONSTRUCTION WITH PLACEMENT OF TISSUE EXPANDER AND ALLODERM Right 11/26/2020   Procedure: BREAST RECONSTRUCTION WITH PLACEMENT OF TISSUE EXPANDER AND ALLODERM;  Surgeon: Irene Limbo, MD;  Location: [place];  Service: Plastics;  Laterality: Right;  . CLEFT PALATE REPAIR    . CLEFT PALATE REPAIR  1970  . COLONOSCOPY WITH PROPOFOL N/A 04/25/2018   Procedure: COLONOSCOPY WITH PROPOFOL;  Surgeon: [name], MD;  Location: [place];  Service: Endoscopy;  Laterality: N/A;  . NIPPLE SPARING MASTECTOMY WITH SENTINEL LYMPH NODE BIOPSY Bilateral 08/06/2020   Procedure: BILATERAL NIPPLE SPARING MASTECTOMIES WITH RIGHT AXILLARY  SENTINEL LYMPH NODE MAPPING;  Surgeon: Erroll Luna, MD;  Location: [place];  Service: General;  Laterality: Bilateral;  PECTORAL BLOCK  . PORT-A-CATH REMOVAL Right 08/06/2020   Procedure: REMOVAL PORT-A-CATH;  Surgeon: Erroll Luna, MD;  Location: [place];  Service: General;  Laterality: Right;  . PORTACATH PLACEMENT Right 02/22/2020  Procedure: INSERTION PORT-A-CATH WITH ULTRASOUND GUIDANCE;  Surgeon: Erroll Luna, MD;  Location: [facility];  Service: General;  Laterality: Right;  . REMOVAL OF TISSUE EXPANDER AND PLACEMENT OF IMPLANT Bilateral 08/25/2020   Procedure: REMOVAL OF RIGHT BREAST TISSUE EXPANDER; DEBRIDEMENT BILATERAL MASTECTOMY FLAP NIPPLES;  Surgeon: Irene Limbo, MD;  Location: [place];   Service: Plastics;  Laterality: Bilateral;    FAMILY HISTORY: Family History  Problem Relation Age of Onset  . Anxiety disorder Mother   . Heart disease Father 12       CABG x4  . Diabetes Father   . Hypertension Father   . Hypothyroidism Father   . Anxiety disorder Daughter   . Heart disease Maternal Grandmother   . Stroke Maternal Grandmother 68  . Heart disease Paternal Aunt   . Stroke Paternal Aunt   . Heart disease Paternal Uncle   . Pancreatic cancer Paternal Uncle        dx 36s  . Hypertension Sister   . Hyperlipidemia Sister   . Hypothyroidism Sister   . Lung cancer Maternal Uncle   . Healthy Son   . Breast cancer Cousin   . Lung cancer Paternal Aunt   . Colon cancer Neg Hx   . Ovarian cancer Neg Hx   . Cervical cancer Neg Hx     SOCIAL HISTORY: Social History   Socioeconomic History  . Marital status: Married    Spouse name: Luretha Rued  . Number of children: 2  . Years of education: 16  . Highest education level: Bachelor's degree (e.g., BA, AB, BS)  Occupational History  . Occupation: works on point of care machines  Tobacco Use  . Smoking status: Never Smoker  . Smokeless tobacco: Never Used  Vaping Use  . Vaping Use: Never used  Substance and Sexual Activity  . Alcohol use: No    Alcohol/week: 0.0 standard drinks  . Drug use: No  . Sexual activity: Yes    Partners: Male    Birth control/protection: Surgical    Comment: husband s/p vasectomy  Other Topics Concern  . Not on file  Social History Narrative   Lives with husband   Social Determinants of Health   Financial Resource Strain: Not on file  Food Insecurity: Not on file  Transportation Needs: Not on file  Physical Activity: Not on file  Stress: Not on file  Social Connections: Not on file  Intimate Partner Violence: Not on file     PHYSICAL EXAM  GENERAL EXAM/CONSTITUTIONAL: Vitals:  Vitals:   12/18/20 1110  BP: 101/71  Pulse: 92  Weight: 139 lb (63 kg)  Height: 5'  1" (1.549 m)   Body mass index is 26.26 kg/m. Wt Readings from Last 3 Encounters:  12/18/20 139 lb (63 kg)  11/26/20 141 lb 5 oz (64.1 kg)  11/22/20 139 lb 6.4 oz (63.2 kg)    Patient is in no distress; well developed, nourished and groomed; neck is supple  CARDIOVASCULAR:  Examination of carotid arteries is normal; no carotid bruits  Regular rate and rhythm, no murmurs  Examination of peripheral vascular system by observation and palpation is normal  EYES:  Ophthalmoscopic exam of optic discs and posterior segments is normal; no papilledema or hemorrhages No exam data present  MUSCULOSKELETAL:  Gait, strength, tone, movements noted in Neurologic exam below  NEUROLOGIC: MENTAL STATUS:  No flowsheet data found.  awake, alert, oriented to person, place and time  recent and remote memory intact  normal attention and  concentration  language fluent, comprehension intact, naming intact  fund of knowledge appropriate  CRANIAL NERVE:   2nd - no papilledema on fundoscopic exam  2nd, 3rd, 4th, 6th - pupils equal and reactive to light, visual fields full to confrontation, extraocular muscles intact, no nystagmus  5th - facial sensation symmetric  7th - facial strength symmetric  8th - hearing intact  9th - palate elevates symmetrically, uvula midline  11th - shoulder shrug symmetric  12th - tongue protrusion midline  MOTOR:   normal bulk and tone, full strength in the BUE, BLE  SUBTLE ATROPHY OF LEFT APB  SENSORY:   normal and symmetric to light touch, pinprick, temperature, vibration  POSITIVE PHALEN'S SIGN BILATERALLY; NEGATIVE TINEL'S  COORDINATION:   finger-nose-finger, fine finger movements normal  REFLEXES:   deep tendon reflexes 1+ and symmetric  GAIT/STATION:   narrow based gait     DIAGNOSTIC DATA (LABS, IMAGING, TESTING) - I reviewed patient records, labs, notes, testing and imaging myself where available.  Lab Results   Component Value Date   WBC 4.7 08/02/2020   HGB 11.2 (L) 08/02/2020   HCT 33.9 (L) 08/02/2020   MCV 95.0 08/02/2020   PLT 183 08/02/2020      Component Value Date/Time   NA 138 08/02/2020 0947   NA 144 04/20/2019 0823   K 4.5 08/02/2020 0947   CL 105 08/02/2020 0947   CO2 24 08/02/2020 0947   GLUCOSE 73 08/02/2020 0947   BUN 16 08/02/2020 0947   BUN 21 04/20/2019 0823   CREATININE 0.72 08/02/2020 0947   CREATININE 0.66 06/21/2020 1033   CREATININE 0.78 09/30/2017 1502   CALCIUM 9.3 08/02/2020 0947   PROT 6.5 08/02/2020 0947   PROT 6.6 04/20/2019 0823   ALBUMIN 4.0 08/02/2020 0947   ALBUMIN 4.6 04/20/2019 0823   AST 30 08/02/2020 0947   AST 37 06/21/2020 1033   ALT 40 08/02/2020 0947   ALT 52 (H) 06/21/2020 1033   ALKPHOS 64 08/02/2020 0947   BILITOT 0.6 08/02/2020 0947   BILITOT 0.4 06/21/2020 1033   GFRNONAA >60 08/02/2020 0947   GFRNONAA >60 06/21/2020 1033   GFRNONAA 89 09/30/2017 1502   GFRAA >60 08/02/2020 0947   GFRAA >60 06/21/2020 1033   GFRAA 103 09/30/2017 1502   Lab Results  Component Value Date   CHOL 188 04/20/2019   HDL 69 04/20/2019   LDLCALC 106 (H) 04/20/2019   TRIG 63 04/20/2019   CHOLHDL 2.7 04/20/2019   Lab Results  Component Value Date   HGBA1C 5.1 06/09/2017   No results found for: [id number] Lab Results  Component Value Date   TSH 1.670 04/20/2019       ASSESSMENT AND PLAN  55 y.o. year old female here with bilateral hand numbness since 2017, worsening in 2021 during chemotherapy.  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  Dx:  1. Bilateral hand numbness      PLAN:  Bilateral hand numbness - suspect long standing carpal tunnel syndrome (since ~2017) with superimposed chemo-neuropathy since 2021 - recommend to try wrist splints at bedtime x 4-6 weeks (these helped patient in the past) - if not improving, then may consider EMG/NCS (nerve testing)  Return for pending  if symptoms worsen or fail to improve.    Penni Bombard, MD [id number], [id number] AM Certified in Neurology, Neurophysiology and Neuroimaging  [facility] [address] [place], [place] [address] [phone]

## 2021-01-20 ENCOUNTER — Encounter: Payer: Self-pay | Admitting: Family Medicine

## 2021-01-20 ENCOUNTER — Other Ambulatory Visit: Payer: Self-pay | Admitting: Family Medicine

## 2021-01-20 ENCOUNTER — Other Ambulatory Visit: Payer: Self-pay

## 2021-01-20 ENCOUNTER — Ambulatory Visit (INDEPENDENT_AMBULATORY_CARE_PROVIDER_SITE_OTHER): Payer: 59 | Admitting: Family Medicine

## 2021-01-20 VITALS — BP 90/62 | HR 80 | Temp 98.2°F | Resp 16 | Ht 61.0 in | Wt 139.1 lb

## 2021-01-20 DIAGNOSIS — Z171 Estrogen receptor negative status [ER-]: Secondary | ICD-10-CM | POA: Diagnosis not present

## 2021-01-20 DIAGNOSIS — F509 Eating disorder, unspecified: Secondary | ICD-10-CM

## 2021-01-20 DIAGNOSIS — C50411 Malignant neoplasm of upper-outer quadrant of right female breast: Secondary | ICD-10-CM

## 2021-01-20 DIAGNOSIS — E663 Overweight: Secondary | ICD-10-CM | POA: Diagnosis not present

## 2021-01-20 DIAGNOSIS — Z23 Encounter for immunization: Secondary | ICD-10-CM

## 2021-01-20 MED ORDER — ESCITALOPRAM OXALATE 20 MG PO TABS
20.0000 mg | ORAL_TABLET | Freq: Every day | ORAL | 3 refills | Status: DC
Start: 2021-01-20 — End: 2021-01-20

## 2021-01-20 MED FILL — ESCITALOPRAM 20 MG TABLET: 20 | 90 days supply | Qty: 90 | Fill #0

## 2021-01-20 NOTE — Assessment & Plan Note (Signed)
Followed by surgery and oncology Awaiting implants next month for reconstruction Done with chemo

## 2021-01-20 NOTE — Assessment & Plan Note (Signed)
Discussed importance of healthy weight management Discussed diet and exercise  Screening lipids with next labs

## 2021-01-20 NOTE — Assessment & Plan Note (Signed)
Chronic and well controlled Continue lexapro at current dose 

## 2021-01-20 NOTE — Progress Notes (Signed)
Established patient visit   Patient: Virginia [name]   DOB: [birth year]   55 y.o. Female  MRN: [id number] Visit Date: 01/20/2021  Today's healthcare provider: Lavon Paganini, MD   Chief Complaint  Patient presents with  . Follow-up   Subjective    HPI  Follow up for disordered eating  The patient was last seen for this 04/17/2019. Changes made at last visit include no changes. Patient to continue Lexapro and referred to [facility] for therapy.  She reports excellent compliance with treatment. She feels that condition is Improved. She is not having side effects.   -----------------------------------------------------------------------------------------    Patient Active Problem List   Diagnosis Date Noted  . Overweight 01/20/2021  . Breast cancer, right ([place]) 08/06/2020  . Port-A-Cath in place 03/22/2020  . Genetic testing 02/26/2020  . Family history of pancreatic cancer   . Family history of breast cancer   . Family history of lung cancer   . Malignant neoplasm of upper-outer quadrant of right breast in female, estrogen receptor negative ([place]) 02/08/2020  . Special screening for malignant neoplasms, colon   . Disordered eating 09/30/2017  . Avitaminosis D 09/30/2017  . Family history of thyroid disease 09/30/2017  . Fibroid 08/31/2017  . Irregular bleeding 08/19/2017  . Arthralgia of hip 10/07/2015   Social History   Tobacco Use  . Smoking status: Never Smoker  . Smokeless tobacco: Never Used  Vaping Use  . Vaping Use: Never used  Substance Use Topics  . Alcohol use: No    Alcohol/week: 0.0 standard drinks  . Drug use: No   No Known Allergies     Medications: Outpatient Medications Prior to Visit  Medication Sig  . ibuprofen (ADVIL) 400 MG tablet Take 400 mg by mouth every 6 (six) hours as needed.  Marland Kitchen omeprazole (PRILOSEC) 20 MG capsule Take 20 mg by mouth daily as needed.  . [DISCONTINUED] escitalopram (LEXAPRO) 20 MG tablet Take 1  tablet (20 mg total) by mouth daily. Please schedule an office visit before anymore refills.   No facility-administered medications prior to visit.    Review of Systems  Constitutional: Negative for activity change, appetite change and fatigue.  Respiratory: Negative for cough and chest tightness.   Cardiovascular: Negative for chest pain and palpitations.    Last CBC Lab Results  Component Value Date   WBC 4.7 08/02/2020   HGB 11.2 (L) 08/02/2020   HCT 33.9 (L) 08/02/2020   MCV 95.0 08/02/2020   MCH 31.4 08/02/2020   RDW 12.2 08/02/2020   PLT 183 22/29/7989   Last metabolic panel Lab Results  Component Value Date   GLUCOSE 73 08/02/2020   NA 138 08/02/2020   K 4.5 08/02/2020   CL 105 08/02/2020   CO2 24 08/02/2020   BUN 16 08/02/2020   CREATININE 0.72 08/02/2020   GFRNONAA >60 08/02/2020   GFRAA >60 08/02/2020   CALCIUM 9.3 08/02/2020   PROT 6.5 08/02/2020   ALBUMIN 4.0 08/02/2020   LABGLOB 2.0 04/20/2019   AGRATIO 2.3 (H) 04/20/2019   BILITOT 0.6 08/02/2020   ALKPHOS 64 08/02/2020   AST 30 08/02/2020   ALT 40 08/02/2020   ANIONGAP 9 08/02/2020   Last thyroid functions Lab Results  Component Value Date   TSH 1.670 04/20/2019        Objective    BP 90/62 (BP Location: Left Arm, Patient Position: Sitting, Cuff Size: Normal)   Pulse 80   Temp 98.2 F (36.8 C) (Oral)  Resp 16   Ht 5\' 1"  (1.549 m)   Wt 139 lb 1.6 oz (63.1 kg)   LMP 06/24/2019 (Approximate)   SpO2 99%   BMI 26.28 kg/m  BP Readings from Last 3 Encounters:  01/20/21 90/62  12/18/20 101/71  11/26/20 115/71   Wt Readings from Last 3 Encounters:  01/20/21 139 lb 1.6 oz (63.1 kg)  12/18/20 139 lb (63 kg)  11/26/20 141 lb 5 oz (64.1 kg)      Physical Exam Vitals reviewed.  Constitutional:      General: She is not in acute distress.    Appearance: Normal appearance. She is well-developed. She is not diaphoretic.  HENT:     Head: Normocephalic and atraumatic.  Eyes:     General:  No scleral icterus.    Conjunctiva/sclera: Conjunctivae normal.  Neck:     Thyroid: No thyromegaly.  Cardiovascular:     Rate and Rhythm: Normal rate and regular rhythm.     Pulses: Normal pulses.     Heart sounds: Normal heart sounds. No murmur heard.   Pulmonary:     Effort: Pulmonary effort is normal. No respiratory distress.     Breath sounds: Normal breath sounds. No wheezing, rhonchi or rales.  Musculoskeletal:     Cervical back: Neck supple.     Right lower leg: No edema.     Left lower leg: No edema.  Lymphadenopathy:     Cervical: No cervical adenopathy.  Skin:    General: Skin is warm and dry.     Findings: No rash.  Neurological:     Mental Status: She is alert and oriented to person, place, and time. Mental status is at baseline.  Psychiatric:        Mood and Affect: Mood normal.        Behavior: Behavior normal.       No results found for any visits on 01/20/21.  Assessment & Plan     Problem List Items Addressed This Visit      Other   Disordered eating - Primary    Chronic and well controlled Continue lexapro at current dose      Malignant neoplasm of upper-outer quadrant of right breast in female, estrogen receptor negative ([place])    Followed by surgery and oncology Awaiting implants next month for reconstruction Done with chemo      Overweight    Discussed importance of healthy weight management Discussed diet and exercise  Screening lipids with next labs      Relevant Orders   Lipid panel    Other Visit Diagnoses    Need for Td vaccine       Relevant Orders   Td vaccine greater than or equal to 7yo preservative free IM       Return in about 3 months (around 04/22/2021) for CPE.      I, Lavon Paganini, MD, have reviewed all documentation for this visit. The documentation on 01/20/21 for the exam, diagnosis, procedures, and orders are all accurate and complete.   Bacigalupo, Dionne Bucy, MD, MPH [facility]

## 2021-02-03 ENCOUNTER — Other Ambulatory Visit (HOSPITAL_COMMUNITY): Payer: Self-pay | Admitting: Plastic Surgery

## 2021-02-03 MED FILL — METHOCARBAMOL 500 MG TABS: 500 | 7 days supply | Qty: 20 | Fill #0

## 2021-02-03 MED FILL — SULFAMETHOXAZOLE-TMP DS TAB: 800-160 | 7 days supply | Qty: 14 | Fill #0

## 2021-02-03 NOTE — H&P (Signed)
Subjective:     Patient ID: Virginia [name] is a 55 y.o. female.  HPI  A little over 2 months post op replacement right chest tissue expander. Scheduled for implant placement next month.  Presented with palpable mass right breast. MMG/US showed 2 cm mass 10 o clock, no axillary lymphadenopathy. 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  MRI demonstrated 2 cm mass at 10 o clock, three indeterminate 4 mm, 5 mm, and 5 mm masses, single prominent axillary node. Second look axillary [place] without lymphadenopathy.  Staging scans negative for distant disease, 5 mm axillary node noted.  Completed neoadjuvant chemotherapy. Final MRI with complete imaging resolution including node.  Underwent bilateral mastectomies with immediate expander based reconstruction. Final pathology no residual invasive carcinoma, 0/2 SLN.   Onset fevers 2 weeks post op evening drains removed with subsequent development right chest cellulitis swelling with eventual removal right TE/ADM, debridement bilateral nipples. Pathology right nipple debridement benign. Culture with S aureus, completed Augmentin course.   Genetics negative  Prior 34 B happy with this. Right mastectomy 329 g Left 323 g  PMH significant for CP, repair done at [facility]. Works for [employer], works on point of care machines. Lives with husband and adult son.  Review of Systems     Objective:   Physical Exam  Cardiovascular: Normal rate, regular rhythm and normal heart sounds.  Pulmonary/Chest: Effort normal and breath sounds normal.    Chest: bilateral chest expanded SN to NAC remnant right 21.5 cm to nipple L 21.5 cm Nipple to IMF R 6.5 cm to scar/8 cm to fold  L 7 cm to scar/ 8 cm to fold    Assessment:     Right breast ca UOQ ER- Neoadjuvant chemotherapy S/p bilateral NSM, prepectoral TE/ADM (Alloderm) reconstruction, Right SLN S/p removal right TE/ADM, debridement bilateral nipples S/p placement right chest  prepectoral TE, ADM (Alloderm)    Plan:     Plan removal bilateral chest tissue expanders and placement silicone implants, lipofilling bilateral chest  Reviewed saline vs silicone, smooth vs textured. As in prepectoral position IrecommendedHCG or capacity filled silicone implants to reduce risk visible rippling. Reviewedultimate volume with implant will be larger than present but overall will be limited by CW size with regards to final implant selection.Reviewed MRIor USsurveillance for rupture with silicone implants.Reviewed implants are not permanent devices and risks contracture, rupture, infection requiring removal. Reviewed purpose of texturing, risk lymphoma with this. Reviewed risk AP malposition implant and this may be more noticeable with HCG implants. Patient has elected for silicone. Plan smooth round.  Discussed fat grafting to chest. Reviewed purpose of this to aid with contour limit visible rippling. Reviewed variable take graft may need to repeat donor site pain and compression. Reviewed fat necrosis may present as palpable lumps. She desires to proceed and plan abdomen and flank donor sites. Patient has Spanx type garment for home use.  Additional risks including but not limited to bleeding seroma hematoma need for additional surgery unacceptable cosmetic result blood clots in legs or lungs, asymmetry, damage to adjacent structures reviewed.  Completed [facility] physician patient checklist.  Plan OP surgery, no drains anticipated.   Rx for robaxin and Bactrim given.  Discussed risk COVID infectionthrough this elective surgery. Patient will receive COVID testing prior to surgery. Discussed even if patient receivesa negative test result, the tests in some cases may fail to detect the virus or patient maycontract COVID after the test.COVID 19 infectionbefore/during/aftersurgery may result in lead to a higher chance of  complication and death.  Natrelle  133S FV-12-T 400 ml tissue expander placed  LEFT fill volume 230 ml saline fill volume RIGHT initial fill volume 340 ml salinel fill volume

## 2021-02-14 ENCOUNTER — Other Ambulatory Visit: Payer: Self-pay

## 2021-02-15 ENCOUNTER — Other Ambulatory Visit (HOSPITAL_COMMUNITY): Payer: Self-pay

## 2021-02-18 ENCOUNTER — Other Ambulatory Visit (HOSPITAL_COMMUNITY): Payer: 59

## 2021-02-19 ENCOUNTER — Other Ambulatory Visit (HOSPITAL_COMMUNITY)
Admission: RE | Admit: 2021-02-19 | Discharge: 2021-02-19 | Disposition: A | Discharge status: Home / Self Care | Payer: 59 | Source: Ambulatory Visit | Attending: Plastic Surgery | Admitting: Plastic Surgery

## 2021-02-19 DIAGNOSIS — Z01812 Encounter for preprocedural laboratory examination: Secondary | ICD-10-CM | POA: Diagnosis not present

## 2021-02-19 DIAGNOSIS — Z20822 Contact with and (suspected) exposure to covid-19: Secondary | ICD-10-CM | POA: Insufficient documentation

## 2021-02-19 LAB — SARS CORONAVIRUS 2 (TAT 6-24 HRS): SARS Coronavirus 2: NEGATIVE

## 2021-02-21 ENCOUNTER — Encounter (HOSPITAL_BASED_OUTPATIENT_CLINIC_OR_DEPARTMENT_OTHER)
Admission: RE | Disposition: A | Discharge status: Home / Self Care | Payer: Self-pay | Source: Home / Self Care | Attending: Plastic Surgery

## 2021-02-21 ENCOUNTER — Ambulatory Visit (HOSPITAL_BASED_OUTPATIENT_CLINIC_OR_DEPARTMENT_OTHER): Payer: 59 | Admitting: Anesthesiology

## 2021-02-21 ENCOUNTER — Encounter (HOSPITAL_BASED_OUTPATIENT_CLINIC_OR_DEPARTMENT_OTHER): Payer: Self-pay | Admitting: Plastic Surgery

## 2021-02-21 ENCOUNTER — Ambulatory Visit (HOSPITAL_BASED_OUTPATIENT_CLINIC_OR_DEPARTMENT_OTHER)
Admission: RE | Admit: 2021-02-21 | Discharge: 2021-02-21 | Disposition: A | Discharge status: Home / Self Care | Payer: 59 | Attending: Plastic Surgery | Admitting: Plastic Surgery

## 2021-02-21 ENCOUNTER — Other Ambulatory Visit: Payer: Self-pay

## 2021-02-21 DIAGNOSIS — Z08 Encounter for follow-up examination after completed treatment for malignant neoplasm: Secondary | ICD-10-CM | POA: Diagnosis not present

## 2021-02-21 DIAGNOSIS — Z9013 Acquired absence of bilateral breasts and nipples: Secondary | ICD-10-CM | POA: Insufficient documentation

## 2021-02-21 DIAGNOSIS — Z45812 Encounter for adjustment or removal of left breast implant: Secondary | ICD-10-CM | POA: Diagnosis not present

## 2021-02-21 DIAGNOSIS — Z853 Personal history of malignant neoplasm of breast: Secondary | ICD-10-CM | POA: Insufficient documentation

## 2021-02-21 DIAGNOSIS — Z45811 Encounter for adjustment or removal of right breast implant: Secondary | ICD-10-CM | POA: Diagnosis not present

## 2021-02-21 HISTORY — PX: REMOVAL OF BILATERAL TISSUE EXPANDERS WITH PLACEMENT OF BILATERAL BREAST IMPLANTS: SHX6431

## 2021-02-21 HISTORY — PX: LIPOSUCTION WITH LIPOFILLING: SHX6436

## 2021-02-21 SURGERY — REMOVAL, TISSUE EXPANDER, BREAST, BILATERAL, WITH BILATERAL IMPLANT IMPLANT INSERTION
Anesthesia: General | Site: Breast | Laterality: Bilateral

## 2021-02-21 MED ORDER — ACETAMINOPHEN 500 MG PO TABS
1000.0000 mg | ORAL_TABLET | Freq: Once | ORAL | Status: AC
  Administered 2021-02-21: 1000 mg via ORAL

## 2021-02-21 MED ORDER — ACETAMINOPHEN 500 MG PO TABS: 1000.0000 mg | ORAL_TABLET | ORAL | Status: DC

## 2021-02-21 MED ORDER — LIDOCAINE HCL (CARDIAC) PF 100 MG/5ML IV SOSY
PREFILLED_SYRINGE | INTRAVENOUS | Status: DC | PRN
  Administered 2021-02-21: 60 mg via INTRAVENOUS

## 2021-02-21 MED ORDER — FENTANYL CITRATE (PF) 100 MCG/2ML IJ SOLN
INTRAMUSCULAR | Status: AC
  Filled 2021-02-21: qty 2

## 2021-02-21 MED ORDER — EPHEDRINE SULFATE 50 MG/ML IJ SOLN
INTRAMUSCULAR | Status: DC | PRN
  Administered 2021-02-21: 10 mg via INTRAVENOUS

## 2021-02-21 MED ORDER — SCOPOLAMINE 1 MG/3DAYS TD PT72
MEDICATED_PATCH | TRANSDERMAL | Status: AC
  Filled 2021-02-21: qty 1

## 2021-02-21 MED ORDER — ACETAMINOPHEN 500 MG PO TABS
ORAL_TABLET | ORAL | Status: AC
  Filled 2021-02-21: qty 2

## 2021-02-21 MED ORDER — CHLORHEXIDINE GLUCONATE CLOTH 2 % EX PADS: 6.0000 | MEDICATED_PAD | Freq: Once | CUTANEOUS | Status: DC

## 2021-02-21 MED ORDER — SODIUM BICARBONATE 4.2 % IV SOLN
INTRAVENOUS | Status: AC
  Filled 2021-02-21: qty 20

## 2021-02-21 MED ORDER — EPINEPHRINE PF 1 MG/ML IJ SOLN
INTRAMUSCULAR | Status: AC
  Filled 2021-02-21: qty 1

## 2021-02-21 MED ORDER — FENTANYL CITRATE (PF) 100 MCG/2ML IJ SOLN: 25.0000 ug | INTRAMUSCULAR | Status: DC | PRN

## 2021-02-21 MED ORDER — ONDANSETRON HCL 4 MG/2ML IJ SOLN
INTRAMUSCULAR | Status: DC | PRN
  Administered 2021-02-21: 4 mg via INTRAVENOUS

## 2021-02-21 MED ORDER — MIDAZOLAM HCL 2 MG/2ML IJ SOLN
INTRAMUSCULAR | Status: AC
  Filled 2021-02-21: qty 2

## 2021-02-21 MED ORDER — LACTATED RINGERS IV SOLN: INTRAVENOUS | Status: DC

## 2021-02-21 MED ORDER — FENTANYL CITRATE (PF) 100 MCG/2ML IJ SOLN
INTRAMUSCULAR | Status: DC | PRN
  Administered 2021-02-21 (×2): 50 ug via INTRAVENOUS

## 2021-02-21 MED ORDER — GABAPENTIN 300 MG PO CAPS
300.0000 mg | ORAL_CAPSULE | Freq: Once | ORAL | Status: AC
  Administered 2021-02-21: 300 mg via ORAL

## 2021-02-21 MED ORDER — GABAPENTIN 300 MG PO CAPS
ORAL_CAPSULE | ORAL | Status: AC
  Filled 2021-02-21: qty 1

## 2021-02-21 MED ORDER — PROMETHAZINE HCL 25 MG/ML IJ SOLN: 6.2500 mg | INTRAMUSCULAR | Status: DC | PRN

## 2021-02-21 MED ORDER — SUGAMMADEX SODIUM 200 MG/2ML IV SOLN
INTRAVENOUS | Status: DC | PRN
  Administered 2021-02-21: 200 mg via INTRAVENOUS

## 2021-02-21 MED ORDER — GABAPENTIN 300 MG PO CAPS: 300.0000 mg | ORAL_CAPSULE | ORAL | Status: DC

## 2021-02-21 MED ORDER — DEXAMETHASONE SODIUM PHOSPHATE 4 MG/ML IJ SOLN
INTRAMUSCULAR | Status: DC | PRN
  Administered 2021-02-21: 10 mg via INTRAVENOUS

## 2021-02-21 MED ORDER — CEFAZOLIN SODIUM-DEXTROSE 2-4 GM/100ML-% IV SOLN
2.0000 g | INTRAVENOUS | Status: AC
  Administered 2021-02-21: 2 g via INTRAVENOUS

## 2021-02-21 MED ORDER — SCOPOLAMINE 1 MG/3DAYS TD PT72
1.0000 | MEDICATED_PATCH | Freq: Once | TRANSDERMAL | Status: DC
  Administered 2021-02-21: 1.5 mg via TRANSDERMAL

## 2021-02-21 MED ORDER — PHENYLEPHRINE 40 MCG/ML (10ML) SYRINGE FOR IV PUSH (FOR BLOOD PRESSURE SUPPORT)
PREFILLED_SYRINGE | INTRAVENOUS | Status: AC
  Filled 2021-02-21: qty 20

## 2021-02-21 MED ORDER — PROPOFOL 10 MG/ML IV BOLUS
INTRAVENOUS | Status: DC | PRN
  Administered 2021-02-21: 120 mg via INTRAVENOUS

## 2021-02-21 MED ORDER — LIDOCAINE HCL (PF) 1 % IJ SOLN
INTRAMUSCULAR | Status: AC
  Filled 2021-02-21: qty 60

## 2021-02-21 MED ORDER — CELECOXIB 200 MG PO CAPS
200.0000 mg | ORAL_CAPSULE | ORAL | Status: AC
  Administered 2021-02-21: 200 mg via ORAL

## 2021-02-21 MED ORDER — SODIUM BICARBONATE 4.2 % IV SOLN
INTRAVENOUS | Status: DC | PRN
  Administered 2021-02-21: 400 mL via INTRAMUSCULAR

## 2021-02-21 MED ORDER — EPHEDRINE 5 MG/ML INJ
INTRAVENOUS | Status: AC
  Filled 2021-02-21: qty 10

## 2021-02-21 MED ORDER — PHENYLEPHRINE 40 MCG/ML (10ML) SYRINGE FOR IV PUSH (FOR BLOOD PRESSURE SUPPORT)
PREFILLED_SYRINGE | INTRAVENOUS | Status: DC | PRN
  Administered 2021-02-21: 40 ug via INTRAVENOUS
  Administered 2021-02-21: 120 ug via INTRAVENOUS
  Administered 2021-02-21: 40 ug via INTRAVENOUS

## 2021-02-21 MED ORDER — CEFAZOLIN SODIUM-DEXTROSE 2-4 GM/100ML-% IV SOLN
INTRAVENOUS | Status: AC
  Filled 2021-02-21: qty 100

## 2021-02-21 MED ORDER — CELECOXIB 200 MG PO CAPS
ORAL_CAPSULE | ORAL | Status: AC
  Filled 2021-02-21: qty 1

## 2021-02-21 MED ORDER — SODIUM CHLORIDE 0.9 % IV SOLN
INTRAVENOUS | Status: DC | PRN
  Administered 2021-02-21: 1000 mL

## 2021-02-21 MED ORDER — POVIDONE-IODINE 10 % EX SOLN
CUTANEOUS | Status: DC | PRN
  Administered 2021-02-21: 1 via TOPICAL

## 2021-02-21 MED ORDER — PROPOFOL 500 MG/50ML IV EMUL
INTRAVENOUS | Status: DC | PRN
  Administered 2021-02-21: 25 ug/kg/min via INTRAVENOUS

## 2021-02-21 MED ORDER — ROCURONIUM BROMIDE 100 MG/10ML IV SOLN
INTRAVENOUS | Status: DC | PRN
  Administered 2021-02-21: 60 mg via INTRAVENOUS

## 2021-02-21 MED ORDER — MIDAZOLAM HCL 5 MG/5ML IJ SOLN
INTRAMUSCULAR | Status: DC | PRN
  Administered 2021-02-21: 2 mg via INTRAVENOUS

## 2021-02-21 SURGICAL SUPPLY — 91 items
ADH SKN CLS APL DERMABOND .7 (GAUZE/BANDAGES/DRESSINGS) ×2
APL PRP STRL LF DISP 70% ISPRP (MISCELLANEOUS) ×2
BAG DECANTER FOR FLEXI CONT (MISCELLANEOUS) ×6 IMPLANT
BINDER ABDOMINAL 10 UNV 27-48 (MISCELLANEOUS) ×3 IMPLANT
BINDER ABDOMINAL 12 SM 30-45 (SOFTGOODS) IMPLANT
BINDER BREAST 3XL (GAUZE/BANDAGES/DRESSINGS) IMPLANT
BINDER BREAST LRG (GAUZE/BANDAGES/DRESSINGS) ×3 IMPLANT
BINDER BREAST MEDIUM (GAUZE/BANDAGES/DRESSINGS) IMPLANT
BINDER BREAST XLRG (GAUZE/BANDAGES/DRESSINGS) IMPLANT
BINDER BREAST XXLRG (GAUZE/BANDAGES/DRESSINGS) IMPLANT
BLADE SURG 10 STRL SS (BLADE) ×6 IMPLANT
BLADE SURG 11 STRL SS (BLADE) ×3 IMPLANT
BNDG GAUZE ELAST 4 BULKY (GAUZE/BANDAGES/DRESSINGS) ×6 IMPLANT
CANISTER LIPO FAT HARVEST (MISCELLANEOUS) IMPLANT
CANISTER SUCT 1200ML W/VALVE (MISCELLANEOUS) ×3 IMPLANT
CHLORAPREP W/TINT 26 (MISCELLANEOUS) ×6 IMPLANT
COVER BACK TABLE 60X90IN (DRAPES) ×3 IMPLANT
COVER MAYO STAND STRL (DRAPES) ×6 IMPLANT
COVER WAND RF STERILE (DRAPES) IMPLANT
DECANTER SPIKE VIAL GLASS SM (MISCELLANEOUS) IMPLANT
DERMABOND ADVANCED (GAUZE/BANDAGES/DRESSINGS) ×4
DERMABOND ADVANCED .7 DNX12 (GAUZE/BANDAGES/DRESSINGS) ×2 IMPLANT
DRAIN CHANNEL 15F RND FF W/TCR (WOUND CARE) IMPLANT
DRAPE TOP ARMCOVERS (MISCELLANEOUS) ×3 IMPLANT
DRAPE U-SHAPE 76X120 STRL (DRAPES) ×3 IMPLANT
DRAPE UTILITY XL STRL (DRAPES) ×6 IMPLANT
DRSG PAD ABDOMINAL 8X10 ST (GAUZE/BANDAGES/DRESSINGS) ×6 IMPLANT
ELECT BLADE 4.0 EZ CLEAN MEGAD (MISCELLANEOUS)
ELECT COATED BLADE 2.86 ST (ELECTRODE) ×3 IMPLANT
ELECT REM PT RETURN 9FT ADLT (ELECTROSURGICAL) ×3
ELECTRODE BLDE 4.0 EZ CLN MEGD (MISCELLANEOUS) IMPLANT
ELECTRODE REM PT RTRN 9FT ADLT (ELECTROSURGICAL) ×1 IMPLANT
EVACUATOR SILICONE 100CC (DRAIN) IMPLANT
EXTRACTOR CANIST REVOLVE STRL (CANNISTER) IMPLANT
GLOVE SURG HYDRASOFT LTX SZ5.5 (GLOVE) ×6 IMPLANT
GOWN STRL REUS W/ TWL LRG LVL3 (GOWN DISPOSABLE) ×2 IMPLANT
GOWN STRL REUS W/TWL LRG LVL3 (GOWN DISPOSABLE) ×6
IMPL BREAST P5.3XFULL RND 450 (Breast) ×1 IMPLANT
IMPL BREAST P5XFULL RND 385 (Breast) ×1 IMPLANT
IMPL BRST P5.3XFULL RND 450CC (Breast) ×1 IMPLANT
IMPL BRST P5XFULL RND 385CC (Breast) ×1 IMPLANT
IMPLANT BREAST GEL 385CC (Breast) ×3 IMPLANT
IMPLANT BREAST GEL 450CC (Breast) ×3 IMPLANT
KIT FILL SYSTEM UNIVERSAL (SET/KITS/TRAYS/PACK) IMPLANT
LINER CANISTER 1000CC FLEX (MISCELLANEOUS) ×3 IMPLANT
MARKER SKIN DUAL TIP RULER LAB (MISCELLANEOUS) IMPLANT
NDL SAFETY ECLIPSE 18X1.5 (NEEDLE) ×1 IMPLANT
NEEDLE FILTER BLUNT 18X 1/2SAF (NEEDLE) ×2
NEEDLE FILTER BLUNT 18X1 1/2 (NEEDLE) ×1 IMPLANT
NEEDLE HYPO 18GX1.5 SHARP (NEEDLE) ×3
NEEDLE HYPO 25X1 1.5 SAFETY (NEEDLE) IMPLANT
NS IRRIG 1000ML POUR BTL (IV SOLUTION) IMPLANT
PACK BASIN DAY SURGERY FS (CUSTOM PROCEDURE TRAY) ×3 IMPLANT
PAD ALCOHOL SWAB (MISCELLANEOUS) ×3 IMPLANT
PENCIL SMOKE EVACUATOR (MISCELLANEOUS) ×3 IMPLANT
PIN SAFETY STERILE (MISCELLANEOUS) IMPLANT
SHEET MEDIUM DRAPE 40X70 STRL (DRAPES) ×6 IMPLANT
SIZER BREAST REUSE 365CC (SIZER) ×3
SIZER BREAST REUSE 385CC (SIZER) ×3
SIZER BREAST REUSE 415CC (SIZER) ×3
SIZER BREAST REUSE SRF 450CC (SIZER) ×3
SIZER BRST REUSE 365CC (SIZER) ×1 IMPLANT
SIZER BRST REUSE 385CC (SIZER) ×1 IMPLANT
SIZER BRST REUSE P5.1X 415CC (SIZER) ×1 IMPLANT
SIZER BRST REUSE SRF 450CC (SIZER) ×1 IMPLANT
SLEEVE SCD COMPRESS KNEE MED (STOCKING) ×3 IMPLANT
SPONGE LAP 18X18 RF (DISPOSABLE) ×6 IMPLANT
STAPLER VISISTAT 35W (STAPLE) ×3 IMPLANT
SUT ETHILON 2 0 FS 18 (SUTURE) IMPLANT
SUT MNCRL AB 4-0 PS2 18 (SUTURE) ×6 IMPLANT
SUT PDS AB 2-0 CT2 27 (SUTURE) IMPLANT
SUT VIC AB 3-0 PS1 18 (SUTURE)
SUT VIC AB 3-0 PS1 18XBRD (SUTURE) IMPLANT
SUT VIC AB 3-0 SH 27 (SUTURE) ×6
SUT VIC AB 3-0 SH 27X BRD (SUTURE) ×2 IMPLANT
SUT VICRYL 4-0 PS2 18IN ABS (SUTURE) ×6 IMPLANT
SYR 10ML LL (SYRINGE) ×9 IMPLANT
SYR 20ML LL LF (SYRINGE) IMPLANT
SYR 50ML LL SCALE MARK (SYRINGE) ×3 IMPLANT
SYR BULB IRRIG 60ML STRL (SYRINGE) ×6 IMPLANT
SYR CONTROL 10ML LL (SYRINGE) IMPLANT
SYR TB 1ML LL NO SAFETY (SYRINGE) ×3 IMPLANT
SYR TOOMEY IRRIG 70ML (MISCELLANEOUS)
SYRINGE TOOMEY IRRIG 70ML (MISCELLANEOUS) IMPLANT
TOWEL GREEN STERILE FF (TOWEL DISPOSABLE) ×3 IMPLANT
TUBE CONNECTING 20'X1/4 (TUBING) ×1
TUBE CONNECTING 20X1/4 (TUBING) ×2 IMPLANT
TUBING INFILTRATION IT-10001 (TUBING) ×3 IMPLANT
TUBING SET GRADUATE ASPIR 12FT (MISCELLANEOUS) ×3 IMPLANT
UNDERPAD 30X36 HEAVY ABSORB (UNDERPADS AND DIAPERS) ×6 IMPLANT
YANKAUER SUCT BULB TIP NO VENT (SUCTIONS) ×3 IMPLANT

## 2021-02-21 NOTE — Transfer of Care (Signed)
Immediate Anesthesia Transfer of Care Note  Patient: Virginia [name]  Procedure(s) Performed: REMOVAL OF BILATERAL TISSUE EXPANDERS WITH PLACEMENT OF BILATERAL BREAST SILICONE IMPLANTS (Bilateral Breast) LIPOSUCTION FROM ABDOMEN AND BILATERAL FLANKS; LIPOFILLING TO BILATERAL CHEST (Bilateral Breast)  Patient Location: PACU  Anesthesia Type:General  Level of Consciousness: drowsy and patient cooperative  Airway & Oxygen Therapy: Patient Spontanous Breathing and Patient connected to face mask oxygen  Post-op Assessment: Report given to RN and Post -op Vital signs reviewed and stable  Post vital signs: Reviewed and stable  Last Vitals:  Vitals Value Taken Time  BP    Temp    Pulse 88 02/21/21 1406  Resp 13 02/21/21 1406  SpO2 100 % 02/21/21 1406  Vitals shown include unvalidated device data.  Last Pain:  Vitals:   02/21/21 0856  TempSrc: Oral  PainSc: 0-No pain      Patients Stated Pain Goal: 4 (09/47/09 6283)  Complications: No complications documented.

## 2021-02-21 NOTE — Anesthesia Postprocedure Evaluation (Signed)
Anesthesia Post Note  Patient: Virginia [name]  Procedure(s) Performed: REMOVAL OF BILATERAL TISSUE EXPANDERS WITH PLACEMENT OF BILATERAL BREAST SILICONE IMPLANTS (Bilateral Breast) LIPOSUCTION FROM ABDOMEN AND BILATERAL FLANKS; LIPOFILLING TO BILATERAL CHEST (Bilateral Breast)     Patient location during evaluation: PACU Anesthesia Type: General Level of consciousness: awake and alert Pain management: pain level controlled Vital Signs Assessment: post-procedure vital signs reviewed and stable Respiratory status: spontaneous breathing, nonlabored ventilation and respiratory function stable Cardiovascular status: blood pressure returned to baseline, stable and tachycardic Postop Assessment: no apparent nausea or vomiting Anesthetic complications: no   No complications documented.  Last Vitals:  Vitals:   02/21/21 1446 02/21/21 1504  BP: 113/77 123/80  Pulse: 90 (!) 102  Resp: 14 16  Temp:  (!) 36.4 C  SpO2: 99% 96%    Last Pain:  Vitals:   02/21/21 1502  TempSrc:   PainSc: 3                  Catalina Gravel

## 2021-02-21 NOTE — Op Note (Signed)
Operative Note   DATE OF OPERATION: 4.8.22  LOCATION: [facility]-outpatient  SURGICAL DIVISION: Plastic Surgery  PREOPERATIVE DIAGNOSES:  1. History breast cancer 2. Acquired absence breasts  POSTOPERATIVE DIAGNOSES:  same  PROCEDURE:  1. Removal bilateral chest tissue expanders and placement silicone implants 2. Lipofilling to bilateral chest 80 ml   SURGEON: Irene Limbo MD MBA  ASSISTANT: none  ANESTHESIA:  General.   EBL: 20 ml  COMPLICATIONS: None immediate.   INDICATIONS FOR PROCEDURE:  The patient, Virginia [name], is a 55 y.o. female born on 06-11-1966, is here for staged breast reconstruction following nipple sparing mastectomies with prepectoral expander acellular dermis reconstruction.    FINDINGS: Complete incorporation ADM noted bilateral. 50 ml fat infiltrated over right mastectomy flap, 30 ml fat infiltrated over left mastectomy flap. Natrelle Soft Touch Full Projection smooth round implants placed bilateral. RIGHT 450 ml REF SSF-450 [id number] LEFT 385 ml REF [id number]  DESCRIPTION OF PROCEDURE:  The patient's operative site was marked with the patient in the preoperative area including bilateral flanks and supra and infra umbilcal abdomen for liposuction. The patient was taken to the operating room. SCDs were placed and IV antibiotics were given. The patient's operative site was prepped and draped in a sterile fashion. A time out was performed and all information was confirmed to be correct.I began onleftside. Incision made through priorinframammary foldscar and carried through superficial fascia to acellular demis.ADM incised.Expander removed. Full incorporation ADM noted. Thermal capsulorraphy performed to medial capsule and superior capsulotomy performed.Sizer placed.  I then directed attention torightchest. Incision made in prior inframammary fold scar and implant cavityentered in similar manner. Expander removed and well incorporated  ADM noted.Sizer placed and patient brought to upright sitting position. Natrelle Smooth RoundFull Projection 450 mlimplant selected for right chest and 385 ml implant selected for left chest placement.The patient was returned to supine position.  Stab incision made over bilateralabdomen. Tumescent fluid infiltrated over supra and infraumbilical abdomen and [id number] tumescent infiltrated. Power assisted liposuction performed to endpoint symmetric contour and soft tissue thickness, total lipoaspirate 242ml. The fat was then washed and prepared by gravity for infiltration. Harvested fat was then infiltrated in subcutaneous plane throughout superior pole bilateral mastectomy flaps.  Each cavity irrigated with saline solution containing Betadine, Ancef, gentamicinsolution.Hemostasis ensured.The implant was placed inrightchest andimplant orientation ensured. Closure completed with 3-0 vicryl to closesuperficial fascia and ADMover implant.4-0 vicryl used to close dermis followed by 4-0 monocryl subcuticular. Implant placed inleftchest cavity.Closure completedin similar fashion.Abdomen incisions approximated with simple 4-0 monocryl stitch.Tissue adhesive applied to chest incisions. Dry dressing applied, followed by breastbinderand abdominal binder.  The patient was allowed to wake from anesthesia, extubated and taken to the recovery room in satisfactory condition.   SPECIMENS: none  DRAINS: none

## 2021-02-21 NOTE — Discharge Instructions (Signed)
May have Tylenol or Ibuprofen after 3pm today, if needed.   Post Anesthesia Home Care Instructions  Activity: Get plenty of rest for the remainder of the day. A responsible individual must stay with you for 24 hours following the procedure.  For the next 24 hours, DO NOT: -Drive a car -Paediatric nurse -Drink alcoholic beverages -Take any medication unless instructed by your physician -Make any legal decisions or sign important papers.  Meals: Start with liquid foods such as gelatin or soup. Progress to regular foods as tolerated. Avoid greasy, spicy, heavy foods. If nausea and/or vomiting occur, drink only clear liquids until the nausea and/or vomiting subsides. Call your physician if vomiting continues.  Special Instructions/Symptoms: Your throat may feel dry or sore from the anesthesia or the breathing tube placed in your throat during surgery. If this causes discomfort, gargle with warm salt water. The discomfort should disappear within 24 hours.  If you had a scopolamine patch placed behind your ear for the management of post- operative nausea and/or vomiting:  1. The medication in the patch is effective for 72 hours, after which it should be removed.  Wrap patch in a tissue and discard in the trash. Wash hands thoroughly with soap and water. 2. You may remove the patch earlier than 72 hours if you experience unpleasant side effects which may include dry mouth, dizziness or visual disturbances. 3. Avoid touching the patch. Wash your hands with soap and water after contact with the patch.

## 2021-02-21 NOTE — Anesthesia Procedure Notes (Signed)
Procedure Name: Intubation Performed by: Evalette Montrose, Ernesta Amble, CRNA Pre-anesthesia Checklist: Patient identified, Emergency Drugs available, Suction available and Patient being monitored Patient Re-evaluated:Patient Re-evaluated prior to induction Oxygen Delivery Method: Circle system utilized Preoxygenation: Pre-oxygenation with 100% oxygen Induction Type: IV induction Ventilation: Mask ventilation without difficulty Laryngoscope Size: Mac and 3 Tube type: Oral Tube size: 7.0 mm Number of attempts: 1 Airway Equipment and Method: Stylet and Oral airway Placement Confirmation: ETT inserted through vocal cords under direct vision,  positive ETCO2 and breath sounds checked- equal and bilateral Tube secured with: Tape Dental Injury: Teeth and Oropharynx as per pre-operative assessment

## 2021-02-21 NOTE — Interval H&P Note (Signed)
History and Physical Interval Note:  02/21/2021 10:46 AM  Virginia [name]  has presented today for surgery, with the diagnosis of History breast cancer, acquired absence breasts.  The various methods of treatment have been discussed with the patient and family. After consideration of risks, benefits and other options for treatment, the patient has consented to  Procedure(s): REMOVAL OF BILATERAL TISSUE EXPANDERS WITH PLACEMENT OF BILATERAL BREAST SILICONE IMPLANTS (Bilateral) POSSIBLE LIPOFILLING TO BILATERAL CHEST (Bilateral) as a surgical intervention.  The patient's history has been reviewed, patient examined, no change in status, stable for surgery.  I have reviewed the patient's chart and labs.  Questions were answered to the patient's satisfaction.     Arnoldo Hooker Mounir Skipper

## 2021-02-21 NOTE — Anesthesia Preprocedure Evaluation (Addendum)
Anesthesia Evaluation  Patient identified by MRN, date of birth, ID band Patient awake    Reviewed: Allergy & Precautions, NPO status , Patient's Chart, lab work & pertinent test results  History of Anesthesia Complications Negative for: history of anesthetic complications  Airway Mallampati: I  TM Distance: >3 FB Neck ROM: Full    Dental  (+) Teeth Intact, Dental Advisory Given   Pulmonary neg pulmonary ROS,    Pulmonary exam normal breath sounds clear to auscultation       Cardiovascular negative cardio ROS Normal cardiovascular exam Rhythm:Regular Rate:Normal     Neuro/Psych PSYCHIATRIC DISORDERS Anxiety negative neurological ROS     GI/Hepatic Neg liver ROS, GERD  Medicated and Controlled,  Endo/Other  negative endocrine ROS  Renal/GU negative Renal ROS     Musculoskeletal  (+) Arthritis ,   Abdominal   Peds  Hematology negative hematology ROS (+)   Anesthesia Other Findings Day of surgery medications reviewed with the patient.  H/o R breast cancer, acquired absence breasts   Reproductive/Obstetrics                            Anesthesia Physical Anesthesia Plan  ASA: II  Anesthesia Plan: General   Post-op Pain Management:    Induction: Intravenous  PONV Risk Score and Plan: 3 and Midazolam, Dexamethasone, Ondansetron and Scopolamine patch - Pre-op  Airway Management Planned: Oral ETT  Additional Equipment:   Intra-op Plan:   Post-operative Plan: Extubation in OR  Informed Consent: I have reviewed the patients History and Physical, chart, labs and discussed the procedure including the risks, benefits and alternatives for the proposed anesthesia with the patient or authorized representative who has indicated his/her understanding and acceptance.     Dental advisory given  Plan Discussed with: CRNA  Anesthesia Plan Comments:         Anesthesia Quick  Evaluation

## 2021-02-24 DIAGNOSIS — C50911 Malignant neoplasm of unspecified site of right female breast: Secondary | ICD-10-CM | POA: Diagnosis not present

## 2021-02-25 ENCOUNTER — Encounter (HOSPITAL_BASED_OUTPATIENT_CLINIC_OR_DEPARTMENT_OTHER): Payer: Self-pay | Admitting: Plastic Surgery

## 2021-04-18 ENCOUNTER — Telehealth (INDEPENDENT_AMBULATORY_CARE_PROVIDER_SITE_OTHER): Payer: 59 | Admitting: Family Medicine

## 2021-04-18 DIAGNOSIS — Z5329 Procedure and treatment not carried out because of patient's decision for other reasons: Secondary | ICD-10-CM

## 2021-04-18 DIAGNOSIS — Z91199 Patient's noncompliance with other medical treatment and regimen due to unspecified reason: Secondary | ICD-10-CM

## 2021-04-18 NOTE — Progress Notes (Signed)
    MyChart Video Visit    Virtual Visit via Video Note   Patient did not show on [place] online platform. Multiple attempts were made to reach patient by phone and there was no answer    [name], MD [facility] [phone] (phone) [phone] (fax)  [place]

## 2021-05-01 ENCOUNTER — Other Ambulatory Visit: Payer: Self-pay | Admitting: Hematology and Oncology

## 2021-05-02 ENCOUNTER — Other Ambulatory Visit (HOSPITAL_COMMUNITY): Payer: Self-pay

## 2021-05-02 ENCOUNTER — Encounter: Payer: Self-pay | Admitting: Hematology and Oncology

## 2021-05-02 MED ORDER — OMEPRAZOLE 40 MG PO CPDR
40.0000 mg | DELAYED_RELEASE_CAPSULE | Freq: Every day | ORAL | 3 refills | Status: DC
  Filled 2021-05-02: qty 30, 30d supply, fill #0

## 2021-05-05 ENCOUNTER — Other Ambulatory Visit: Payer: Self-pay

## 2021-05-05 ENCOUNTER — Ambulatory Visit (INDEPENDENT_AMBULATORY_CARE_PROVIDER_SITE_OTHER): Payer: 59 | Admitting: Family Medicine

## 2021-05-05 ENCOUNTER — Encounter: Payer: Self-pay | Admitting: Family Medicine

## 2021-05-05 VITALS — BP 100/72 | HR 103 | Temp 98.2°F | Resp 16 | Ht 61.0 in | Wt 145.5 lb

## 2021-05-05 DIAGNOSIS — E559 Vitamin D deficiency, unspecified: Secondary | ICD-10-CM | POA: Diagnosis not present

## 2021-05-05 DIAGNOSIS — C50411 Malignant neoplasm of upper-outer quadrant of right female breast: Secondary | ICD-10-CM

## 2021-05-05 DIAGNOSIS — Z171 Estrogen receptor negative status [ER-]: Secondary | ICD-10-CM

## 2021-05-05 DIAGNOSIS — Z0000 Encounter for general adult medical examination without abnormal findings: Secondary | ICD-10-CM | POA: Diagnosis not present

## 2021-05-05 DIAGNOSIS — Z23 Encounter for immunization: Secondary | ICD-10-CM | POA: Diagnosis not present

## 2021-05-05 DIAGNOSIS — E663 Overweight: Secondary | ICD-10-CM

## 2021-05-05 DIAGNOSIS — Z1159 Encounter for screening for other viral diseases: Secondary | ICD-10-CM

## 2021-05-05 NOTE — Addendum Note (Signed)
Addended by: Shawna Orleans on: 05/05/2021 05:09 PM   Modules accepted: Orders

## 2021-05-05 NOTE — Progress Notes (Signed)
Complete physical exam   Patient: Virginia [name]   DOB: 1966/10/11   55 y.o. Female  MRN: [id number] Visit Date: 05/05/2021  Today's healthcare provider: Lavon Paganini, MD   Chief Complaint  Patient presents with   Annual Exam   Subjective    Virginia [name] is a 55 y.o. female who presents today for a complete physical exam.  She reports consuming a general diet. Home exercise routine includes walking 1 hrs per week. She generally feels well. She reports sleeping well. She does have additional problems to discuss today.  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 She believes this is to be the cause of the acute ear itching that she is experiencing.    Past Medical History:  Diagnosis Date   Anorexia    Anxiety    Arthritis    knees   Cancer ([place])    R Breast Cancer 2021   Family history of breast cancer    Family history of lung cancer    Family history of pancreatic cancer    Fibroids    Wears glasses    Past Surgical History:  Procedure Laterality Date   BREAST RECONSTRUCTION WITH PLACEMENT OF TISSUE EXPANDER AND ALLODERM Bilateral 08/06/2020   Procedure: BILATERAL BREAST RECONSTRUCTION WITH PLACEMENT OF TISSUE EXPANDER AND ALLODERM;  Surgeon: Irene Limbo, MD;  Location: [place];  Service: Plastics;  Laterality: Bilateral;   BREAST RECONSTRUCTION WITH PLACEMENT OF TISSUE EXPANDER AND ALLODERM Right 11/26/2020   Procedure: BREAST RECONSTRUCTION WITH PLACEMENT OF TISSUE EXPANDER AND ALLODERM;  Surgeon: Irene Limbo, MD;  Location: [place];  Service: Plastics;  Laterality: Right;   CLEFT PALATE REPAIR     CLEFT PALATE REPAIR  1970   COLONOSCOPY WITH PROPOFOL N/A 04/25/2018   Procedure: COLONOSCOPY WITH PROPOFOL;  Surgeon: [name], MD;  Location: [place];  Service:  Endoscopy;  Laterality: N/A;   LIPOSUCTION WITH LIPOFILLING Bilateral 02/21/2021   Procedure: LIPOSUCTION FROM ABDOMEN AND BILATERAL FLANKS; LIPOFILLING TO BILATERAL CHEST;  Surgeon: Irene Limbo, MD;  Location: [place];  Service: Plastics;  Laterality: Bilateral;   NIPPLE SPARING MASTECTOMY WITH SENTINEL LYMPH NODE BIOPSY Bilateral 08/06/2020   Procedure: BILATERAL NIPPLE SPARING MASTECTOMIES WITH RIGHT AXILLARY  SENTINEL LYMPH NODE MAPPING;  Surgeon: Erroll Luna, MD;  Location: [place];  Service: General;  Laterality: Bilateral;  PECTORAL BLOCK   PORT-A-CATH REMOVAL Right 08/06/2020   Procedure: REMOVAL PORT-A-CATH;  Surgeon: Erroll Luna, MD;  Location: [place];  Service: General;  Laterality: Right;   PORTACATH PLACEMENT Right 02/22/2020   Procedure: INSERTION PORT-A-CATH WITH ULTRASOUND GUIDANCE;  Surgeon: Erroll Luna, MD;  Location: [facility];  Service: General;  Laterality: Right;   REMOVAL OF BILATERAL TISSUE EXPANDERS WITH PLACEMENT OF BILATERAL BREAST IMPLANTS Bilateral 02/21/2021   Procedure: REMOVAL OF BILATERAL TISSUE EXPANDERS WITH PLACEMENT OF BILATERAL BREAST SILICONE IMPLANTS;  Surgeon: Irene Limbo, MD;  Location: [place];  Service: Plastics;  Laterality: Bilateral;   REMOVAL OF TISSUE EXPANDER AND PLACEMENT OF IMPLANT Bilateral 08/25/2020   Procedure: REMOVAL OF RIGHT BREAST TISSUE EXPANDER; DEBRIDEMENT BILATERAL MASTECTOMY FLAP NIPPLES;  Surgeon: Irene Limbo, MD;  Location: [place];  Service: Plastics;  Laterality: Bilateral;   Social History   Socioeconomic History   Marital status: Married    Spouse name: Luretha Rued  Number of children: 2   Years of education: 16   Highest education level: Bachelor's degree (e.g., BA, AB, BS)  Occupational History   Occupation: works on [employer]  Tobacco Use   Smoking status: Never   Smokeless tobacco: Never  Vaping Use   Vaping  Use: Never used  Substance and Sexual Activity   Alcohol use: No    Alcohol/week: 0.0 standard drinks   Drug use: No   Sexual activity: Yes    Partners: Male    Birth control/protection: Surgical    Comment: husband s/p vasectomy  Other Topics Concern   Not on file  Social History Narrative   Lives with husband   Social Determinants of Health   Financial Resource Strain: Not on file  Food Insecurity: Not on file  Transportation Needs: Not on file  Physical Activity: Not on file  Stress: Not on file  Social Connections: Not on file  Intimate Partner Violence: Not on file   Family Status  Relation Name Status   Mother  Alive   Father  Alive   Daughter  Alive   [place]  Deceased   Field seismologist  (Not Specified)   Psychiatrist  Deceased   Sister  Alive   Sister  [place] x2 Deceased   Son  [place] x2 Alive   MGF  Deceased   [place]  Deceased   PGF  Deceased   Cousin pat Alive   Pat Aunt  Deceased   Neg Hx  (Not Specified)   Family History  Problem Relation Age of Onset   Anxiety disorder Mother    Heart disease Father 76       CABG x4   Diabetes Father    Hypertension Father    Hypothyroidism Father    Anxiety disorder Daughter    Heart disease Maternal Grandmother    Stroke Maternal Grandmother 68   Heart disease Paternal Aunt    Stroke Paternal Aunt    Heart disease Paternal Uncle    Pancreatic cancer Paternal Uncle        dx 60s   Hypertension Sister    Hyperlipidemia Sister    Hypothyroidism Sister    Lung cancer Maternal Uncle    Healthy Son    Breast cancer Cousin    Lung cancer Paternal Aunt    Colon cancer Neg Hx    Ovarian cancer Neg Hx    Cervical cancer Neg Hx    No Known Allergies  Patient Care Team: Michela Herst, Dionne Bucy, MD as PCP - General (Family Medicine) Erroll Luna, MD as Consulting Physician (General Surgery) Nicholas Lose, MD as Consulting Physician (Hematology and Oncology) Irene Limbo, MD as Consulting Physician  (Plastic Surgery)   Medications: Outpatient Medications Prior to Visit  Medication Sig   cetirizine (ZYRTEC) 10 MG tablet Take 10 mg by mouth daily.   escitalopram (LEXAPRO) 20 MG tablet TAKE 1 TABLET (20 MG TOTAL) BY MOUTH DAILY.   ibuprofen (ADVIL) 400 MG tablet Take 400 mg by mouth every 6 (six) hours as needed.   omeprazole (PRILOSEC) 40 MG capsule Take 1 capsule (40 mg total) by mouth daily.   [DISCONTINUED] amoxicillin-clavulanate (AUGMENTIN) 875-125 MG tablet TAKE 1 TABLET BY MOUTH TWICE DAILY FOR 3 DAYS.   [DISCONTINUED] amoxicillin-clavulanate (AUGMENTIN) 875-125 MG tablet TAKE 1 TABLET BY MOUTH 2 TIMES DAILY FOR 7 DAYS.   [DISCONTINUED] ibuprofen (ADVIL) 800 MG tablet TAKE 1 TABLET (800 MG TOTAL)  BY MOUTH EVERY 8 (EIGHT) HOURS AS NEEDED FOR UP TO 10 DAYS.   [DISCONTINUED] methocarbamol (ROBAXIN) 500 MG tablet TAKE 1 TABLET (500 MG TOTAL) BY MOUTH 3 (THREE) TIMES DAILY AS NEEDED FOR UP TO 10 DAYS.   [DISCONTINUED] methocarbamol (ROBAXIN) 500 MG tablet TAKE 1 TABLET (500 MG TOTAL) BY MOUTH EVERY 8 (EIGHT) HOURS AS NEEDED FOR UP TO 10 DAYS.   [DISCONTINUED] omeprazole (PRILOSEC) 20 MG capsule Take 20 mg by mouth daily as needed.   [DISCONTINUED] sulfamethoxazole-trimethoprim (BACTRIM DS) 800-160 MG tablet TAKE 1 TABLET BY MOUTH 2 TIMES DAILY FOR 7 DAYS.   [DISCONTINUED] sulfamethoxazole-trimethoprim (BACTRIM DS) 800-160 MG tablet TAKE 1 TABLET BY MOUTH 2 TIMES DAILY.   No facility-administered medications prior to visit.    Review of Systems  Constitutional: Negative.  Negative for chills, fatigue and fever.  HENT:  Positive for rhinorrhea. Negative for congestion, ear pain, sinus pain and sore throat.   Eyes: Negative.   Respiratory: Negative.  Negative for cough, shortness of breath and wheezing.   Cardiovascular: Negative.  Negative for chest pain and leg swelling.  Gastrointestinal:  Positive for constipation. Negative for abdominal pain, blood in stool, diarrhea, nausea and  vomiting.  Endocrine: Negative.   Genitourinary: Negative.  Negative for dysuria, flank pain, frequency and urgency.  Musculoskeletal: Negative.   Skin: Negative.   Allergic/Immunologic: Negative.   Neurological: Negative.  Negative for dizziness and headaches.  Hematological: Negative.   Psychiatric/Behavioral: Negative.     Last CBC Lab Results  Component Value Date   WBC 4.7 08/02/2020   HGB 11.2 (L) 08/02/2020   HCT 33.9 (L) 08/02/2020   MCV 95.0 08/02/2020   MCH 31.4 08/02/2020   RDW 12.2 08/02/2020   PLT 183 24/40/1027   Last metabolic panel Lab Results  Component Value Date   GLUCOSE 73 08/02/2020   NA 138 08/02/2020   K 4.5 08/02/2020   CL 105 08/02/2020   CO2 24 08/02/2020   BUN 16 08/02/2020   CREATININE 0.72 08/02/2020   GFRNONAA >60 08/02/2020   GFRAA >60 08/02/2020   CALCIUM 9.3 08/02/2020   PROT 6.5 08/02/2020   ALBUMIN 4.0 08/02/2020   LABGLOB 2.0 04/20/2019   AGRATIO 2.3 (H) 04/20/2019   BILITOT 0.6 08/02/2020   ALKPHOS 64 08/02/2020   AST 30 08/02/2020   ALT 40 08/02/2020   ANIONGAP 9 08/02/2020   Last lipids Lab Results  Component Value Date   CHOL 188 04/20/2019   HDL 69 04/20/2019   LDLCALC 106 (H) 04/20/2019   TRIG 63 04/20/2019   CHOLHDL 2.7 04/20/2019   Last hemoglobin A1c Lab Results  Component Value Date   HGBA1C 5.1 06/09/2017   Last thyroid functions Lab Results  Component Value Date   TSH 1.670 04/20/2019   Last vitamin D Lab Results  Component Value Date   VD25OH 24.5 (L) 04/20/2019   Last vitamin B12 and Folate No results found for: VITAMINB12, FOLATE    Objective    BP 100/72 (BP Location: Left Arm, Patient Position: Sitting, Cuff Size: Large)   Pulse (!) 103   Temp 98.2 F (36.8 C) (Oral)   Resp 16   Ht 5\' 1"  (1.549 m)   Wt 145 lb 8 oz (66 kg)   LMP 06/24/2019 (Approximate)   SpO2 97%   BMI 27.49 kg/m  BP Readings from Last 3 Encounters:  05/05/21 100/72  02/21/21 123/80  01/20/21 90/62   Wt  Readings from Last 3 Encounters:  05/05/21 145 lb  8 oz (66 kg)  02/21/21 141 lb 5 oz (64.1 kg)  01/20/21 139 lb 1.6 oz (63.1 kg)   Physical Exam Vitals reviewed.  Constitutional:      General: She is not in acute distress.    Appearance: Normal appearance. She is well-developed. She is not diaphoretic.  HENT:     Head: Normocephalic and atraumatic.     Right Ear: Tympanic membrane, ear canal and external ear normal.     Left Ear: Tympanic membrane, ear canal and external ear normal.     Nose: Nose normal.     Mouth/Throat:     Mouth: Mucous membranes are moist.     Pharynx: Oropharynx is clear. No oropharyngeal exudate.  Eyes:     General: No scleral icterus.    Conjunctiva/sclera: Conjunctivae normal.     Pupils: Pupils are equal, round, and reactive to light.  Neck:     Thyroid: No thyromegaly.  Cardiovascular:     Rate and Rhythm: Normal rate and regular rhythm.     Pulses: Normal pulses.     Heart sounds: Normal heart sounds. No murmur heard. Pulmonary:     Effort: Pulmonary effort is normal. No respiratory distress.     Breath sounds: Normal breath sounds. No wheezing or rales.  Abdominal:     General: There is no distension.     Palpations: Abdomen is soft.     Tenderness: There is no abdominal tenderness.  Musculoskeletal:        General: No deformity.     Cervical back: Neck supple.     Right lower leg: No edema.     Left lower leg: No edema.  Lymphadenopathy:     Cervical: No cervical adenopathy.  Skin:    General: Skin is warm and dry.     Findings: No rash.  Neurological:     Mental Status: She is alert and oriented to person, place, and time. Mental status is at baseline.     Sensory: No sensory deficit.     Motor: No weakness.     Gait: Gait normal.  Psychiatric:        Mood and Affect: Mood normal.        Behavior: Behavior normal.        Thought Content: Thought content normal.      Last depression screening scores PHQ 2/9 Scores 05/05/2021  01/20/2021 04/17/2019  PHQ - 2 Score 0 0 0  PHQ- 9 Score 0 0 -   Last fall risk screening Fall Risk  05/05/2021  Falls in the past year? 0  Number falls in past yr: 0  Injury with Fall? 0  Risk for fall due to : No Fall Risks  Follow up Falls evaluation completed   Last Audit-C alcohol use screening Alcohol Use Disorder Test (AUDIT) 05/05/2021  1. How often do you have a drink containing alcohol? 0  2. How many drinks containing alcohol do you have on a typical day when you are drinking? 0  3. How often do you have six or more drinks on one occasion? 0  AUDIT-C Score 0  Alcohol Brief Interventions/Follow-up -   A score of 3 or more in women, and 4 or more in men indicates increased risk for alcohol abuse, EXCEPT if all of the points are from question 1   No results found for any visits on 05/05/21.  Assessment & Plan    Routine Health Maintenance and Physical Exam  Exercise Activities and Dietary  recommendations  Goals   None     Immunization History  Administered Date(s) Administered   Influenza,inj,Quad PF,6+ Mos 08/17/2019   Influenza-Unspecified 08/08/2015, 09/13/2020   PFIZER(Purple Top)SARS-COV-2 Vaccination 11/15/2019, 12/05/2019, 10/18/2020   Td 11/06/2010, 01/20/2021   Tdap 11/06/2010   Zoster Recombinat (Shingrix) 09/10/2019    Health Maintenance  Topic Date Due   Pneumococcal Vaccine 23-46 Years old (1 - PCV) Never done   Hepatitis C Screening  Never done   Zoster Vaccines- Shingrix (2 of 2) 11/05/2019   COVID-19 Vaccine (4 - Booster for Pfizer series) 01/16/2021   INFLUENZA VACCINE  06/16/2021   PAP SMEAR-Modifier  01/24/2023   COLONOSCOPY (Pts 45-65yrs Insurance coverage will need to be confirmed)  04/25/2028   TETANUS/TDAP  01/21/2031   HIV Screening  Completed   HPV VACCINES  Aged Out    Discussed health benefits of physical activity, and encouraged her to engage in regular exercise appropriate for her age and condition.  Problem List Items Addressed  This Visit       Other   Avitaminosis D   Relevant Orders   VITAMIN D 25 Hydroxy (Vit-D Deficiency, Fractures)   Malignant neoplasm of upper-outer quadrant of right breast in female, estrogen receptor negative ([place])   Relevant Orders   CBC w/Diff/Platelet   Overweight   Relevant Orders   Comprehensive metabolic panel   Lipid Panel With LDL/HDL Ratio   CBC w/Diff/Platelet   Other Visit Diagnoses     Encounter for annual health examination    -  Primary   Relevant Orders   Comprehensive metabolic panel   Lipid Panel With LDL/HDL Ratio   VITAMIN D 25 Hydroxy (Vit-D Deficiency, Fractures)   CBC w/Diff/Platelet   Hepatitis C Antibody   Need for hepatitis C screening test       Relevant Orders   Hepatitis C Antibody        Return in about 1 year (around 05/05/2022) for CPE.      Frederic Jericho [place],acting as a Education administrator for Lavon Paganini, MD.,have documented all relevant documentation on the behalf of Lavon Paganini, MD,as directed by  Lavon Paganini, MD while in the presence of Lavon Paganini, MD.  I, Lavon Paganini, MD, have reviewed all documentation for this visit. The documentation on 05/05/21 for the exam, diagnosis, procedures, and orders are all accurate and complete.   Megean Fabio, Dionne Bucy, MD, MPH [facility]

## 2021-05-05 NOTE — Patient Instructions (Signed)
Preventive Care 53-53 Years Old, Female Preventive care refers to lifestyle choices and visits with your health care provider that can promote health and wellness. This includes: A yearly physical exam. This is also called an annual wellness visit. Regular dental and eye exams. Immunizations. Screening for certain conditions. Healthy lifestyle choices, such as: Eating a healthy diet. Getting regular exercise. Not using drugs or products that contain nicotine and tobacco. Limiting alcohol use. What can I expect for my preventive care visit? Physical exam Your health care provider will check your: Height and weight. These may be used to calculate your BMI (body mass index). BMI is a measurement that tells if you are at a healthy weight. Heart rate and blood pressure. Body temperature. Skin for abnormal spots. Counseling Your health care provider may ask you questions about your: Past medical problems. Family's medical history. Alcohol, tobacco, and drug use. Emotional well-being. Home life and relationship well-being. Sexual activity. Diet, exercise, and sleep habits. Work and work Statistician. Access to firearms. Method of birth control. Menstrual cycle. Pregnancy history. What immunizations do I need?  Vaccines are usually given at various ages, according to a schedule. Your health care provider will recommend vaccines for you based on your age, medicalhistory, and lifestyle or other factors, such as travel or where you work. What tests do I need? Blood tests Lipid and cholesterol levels. These may be checked every 5 years, or more often if you are over 53 years old. Hepatitis C test. Hepatitis B test. Screening Lung cancer screening. You may have this screening every year starting at age 53 if you have a 30-pack-year history of smoking and currently smoke or have quit within the past 15 years. Colorectal cancer screening. All adults should have this screening starting at  age 23 and continuing until age 3. Your health care provider may recommend screening at age 53 if you are at increased risk. You will have tests every 1-10 years, depending on your results and the type of screening test. Diabetes screening. This is done by checking your blood sugar (glucose) after you have not eaten for a while (fasting). You may have this done every 1-3 years. Mammogram. This may be done every 1-2 years. Talk with your health care provider about when you should start having regular mammograms. This may depend on whether you have a family history of breast cancer. BRCA-related cancer screening. This may be done if you have a family history of breast, ovarian, tubal, or peritoneal cancers. Pelvic exam and Pap test. This may be done every 3 years starting at age 53. Starting at age 54, this may be done every 5 years if you have a Pap test in combination with an HPV test. Other tests STD (sexually transmitted disease) testing, if you are at risk. Bone density scan. This is done to screen for osteoporosis. You may have this scan if you are at high risk for osteoporosis. Talk with your health care provider about your test results, treatment options,and if necessary, the need for more tests. Follow these instructions at home: Eating and drinking  Eat a diet that includes fresh fruits and vegetables, whole grains, lean protein, and low-fat dairy products. Take vitamin and mineral supplements as recommended by your health care provider. Do not drink alcohol if: Your health care provider tells you not to drink. You are pregnant, may be pregnant, or are planning to become pregnant. If you drink alcohol: Limit how much you have to 0-1 drink a day. Be aware  of how much alcohol is in your drink. In the [place] one drink equals one 12 oz bottle of beer (355 mL), one 5 oz glass of wine (148 mL), or one 1 oz glass of hard liquor (44 mL).  Lifestyle Take daily care of your teeth and  gums. Brush your teeth every morning and night with fluoride toothpaste. Floss one time each day. Stay active. Exercise for at least 30 minutes 5 or more days each week. Do not use any products that contain nicotine or tobacco, such as cigarettes, e-cigarettes, and chewing tobacco. If you need help quitting, ask your health care provider. Do not use drugs. If you are sexually active, practice safe sex. Use a condom or other form of protection to prevent STIs (sexually transmitted infections). If you do not wish to become pregnant, use a form of birth control. If you plan to become pregnant, see your health care provider for a prepregnancy visit. If told by your health care provider, take low-dose aspirin daily starting at age 53. Find healthy ways to cope with stress, such as: Meditation, yoga, or listening to music. Journaling. Talking to a trusted person. Spending time with friends and family. Safety Always wear your seat belt while driving or riding in a vehicle. Do not drive: If you have been drinking alcohol. Do not ride with someone who has been drinking. When you are tired or distracted. While texting. Wear a helmet and other protective equipment during sports activities. If you have firearms in your house, make sure you follow all gun safety procedures. What's next? Visit your health care provider once a year for an annual wellness visit. Ask your health care provider how often you should have your eyes and teeth checked. Stay up to date on all vaccines. This information is not intended to replace advice given to you by your health care provider. Make sure you discuss any questions you have with your healthcare provider. Document Revised: 08/06/2020 Document Reviewed: 07/14/2018 Elsevier Patient Education  2022 [employer].

## 2021-05-12 ENCOUNTER — Other Ambulatory Visit (HOSPITAL_COMMUNITY): Payer: Self-pay

## 2021-05-13 ENCOUNTER — Other Ambulatory Visit (HOSPITAL_COMMUNITY): Payer: Self-pay

## 2021-05-13 DIAGNOSIS — E559 Vitamin D deficiency, unspecified: Secondary | ICD-10-CM | POA: Diagnosis not present

## 2021-05-13 DIAGNOSIS — Z0000 Encounter for general adult medical examination without abnormal findings: Secondary | ICD-10-CM | POA: Diagnosis not present

## 2021-05-13 DIAGNOSIS — E663 Overweight: Secondary | ICD-10-CM | POA: Diagnosis not present

## 2021-05-13 DIAGNOSIS — Z1159 Encounter for screening for other viral diseases: Secondary | ICD-10-CM | POA: Diagnosis not present

## 2021-05-13 DIAGNOSIS — Z171 Estrogen receptor negative status [ER-]: Secondary | ICD-10-CM | POA: Diagnosis not present

## 2021-05-13 DIAGNOSIS — C50411 Malignant neoplasm of upper-outer quadrant of right female breast: Secondary | ICD-10-CM | POA: Diagnosis not present

## 2021-05-13 MED FILL — Escitalopram Oxalate Tab 20 MG (Base Equiv): ORAL | 90 days supply | Qty: 90 | Fill #0 | Status: AC

## 2021-05-14 ENCOUNTER — Encounter: Payer: Self-pay | Admitting: Hematology and Oncology

## 2021-05-14 LAB — COMPREHENSIVE METABOLIC PANEL
ALT: 16 IU/L (ref 0–32)
AST: 18 IU/L (ref 0–40)
Albumin/Globulin Ratio: 2.4 — ABNORMAL HIGH (ref 1.2–2.2)
Albumin: 4.7 g/dL (ref 3.8–4.9)
Alkaline Phosphatase: 74 IU/L (ref 44–121)
BUN/Creatinine Ratio: 21 (ref 9–23)
BUN: 15 mg/dL (ref 6–24)
Bilirubin Total: 0.3 mg/dL (ref 0.0–1.2)
CO2: 25 mmol/L (ref 20–29)
Calcium: 9.4 mg/dL (ref 8.7–10.2)
Chloride: 102 mmol/L (ref 96–106)
Creatinine, Ser: 0.71 mg/dL (ref 0.57–1.00)
Globulin, Total: 2 g/dL (ref 1.5–4.5)
Glucose: 87 mg/dL (ref 65–99)
Potassium: 4.4 mmol/L (ref 3.5–5.2)
Sodium: 141 mmol/L (ref 134–144)
Total Protein: 6.7 g/dL (ref 6.0–8.5)
eGFR: 102 mL/min/{1.73_m2} (ref >59)

## 2021-05-14 LAB — CBC WITH DIFFERENTIAL/PLATELET
Basophils Absolute: 0.1 10*3/uL (ref 0.0–0.2)
Basos: 1 %
EOS (ABSOLUTE): 0.2 10*3/uL (ref 0.0–0.4)
Eos: 4 %
Hematocrit: 36.6 % (ref 34.0–46.6)
Hemoglobin: 12.3 g/dL (ref 11.1–15.9)
Immature Grans (Abs): 0 10*3/uL (ref 0.0–0.1)
Immature Granulocytes: 0 %
Lymphocytes Absolute: 2.2 10*3/uL (ref 0.7–3.1)
Lymphs: 38 %
MCH: 29.1 pg (ref 26.6–33.0)
MCHC: 33.6 g/dL (ref 31.5–35.7)
MCV: 87 fL (ref 79–97)
Monocytes Absolute: 0.4 10*3/uL (ref 0.1–0.9)
Monocytes: 7 %
Neutrophils Absolute: 2.8 10*3/uL (ref 1.4–7.0)
Neutrophils: 50 %
Platelets: 193 10*3/uL (ref 150–450)
RBC: 4.23 x10E6/uL (ref 3.77–5.28)
RDW: 13.1 % (ref 11.7–15.4)
WBC: 5.6 10*3/uL (ref 3.4–10.8)

## 2021-05-14 LAB — VITAMIN D 25 HYDROXY (VIT D DEFICIENCY, FRACTURES): Vit D, 25-Hydroxy: 25.8 ng/mL — ABNORMAL LOW (ref 30.0–100.0)

## 2021-05-14 LAB — LIPID PANEL WITH LDL/HDL RATIO
Cholesterol, Total: 186 mg/dL (ref 100–199)
HDL: 60 mg/dL (ref >39)
LDL Chol Calc (NIH): 109 mg/dL — ABNORMAL HIGH (ref 0–99)
LDL/HDL Ratio: 1.8 ratio (ref 0.0–3.2)
Triglycerides: 91 mg/dL (ref 0–149)
VLDL Cholesterol Cal: 17 mg/dL (ref 5–40)

## 2021-05-14 LAB — HEPATITIS C ANTIBODY: Hep C Virus Ab: <0.1 s/co ratio (ref 0.0–0.9)

## 2021-05-21 NOTE — Progress Notes (Signed)
Patient Care Team: Virginia Crews, MD as PCP - General (Family Medicine) Erroll Luna, MD as Consulting Physician (General Surgery) Nicholas Lose, MD as Consulting Physician (Hematology and Oncology) Irene Limbo, MD as Consulting Physician (Plastic Surgery)  DIAGNOSIS:    ICD-10-CM   1. Malignant neoplasm of upper-outer quadrant of right breast in female, estrogen receptor negative ([place])  C50.411    Z17.1       SUMMARY OF ONCOLOGIC HISTORY: Oncology History  Malignant neoplasm of upper-outer quadrant of right breast in female, estrogen receptor negative ([place])  02/02/2020 Initial Diagnosis   Right breast lump tenderness. Diagnostic mammogram and [place] on 02/01/20 showed a 2.0cm mass at the 10 o'clock position with no axillary adenopathy. 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, ER/PR negative, Ki67 80%   02/08/2020 Cancer Staging   Staging form: Breast, AJCC 8th Edition - Clinical stage from 02/08/2020: Stage IB (cT1c, cN0, cM0, G3, ER-, PR-, HER2-) - Signed by Nicholas Lose, MD on 02/08/2020    02/23/2020 -  Chemotherapy   The patient had dexamethasone (DECADRON) 4 MG tablet, 1 of 1 cycle, Start date: 02/08/2020, End date: 04/22/2020 DOXOrubicin (ADRIAMYCIN) chemo injection 98 mg, 60 mg/m2 = 98 mg, Intravenous,  Once, 4 of 4 cycles Administration: 98 mg (02/23/2020), 98 mg (03/08/2020), 98 mg (03/22/2020), 98 mg (04/05/2020) palonosetron (ALOXI) injection 0.25 mg, 0.25 mg, Intravenous,  Once, 7 of 8 cycles Administration: 0.25 mg (02/23/2020), 0.25 mg (04/22/2020), 0.25 mg (03/08/2020), 0.25 mg (03/22/2020), 0.25 mg (04/05/2020), 0.25 mg (05/17/2020), 0.25 mg (06/07/2020) pegfilgrastim (NEULASTA ONPRO KIT) injection 6 mg, 6 mg, Subcutaneous, Once, 4 of 4 cycles Administration: 6 mg (02/23/2020), 6 mg (03/08/2020), 6 mg (03/22/2020), 6 mg (04/05/2020) CARBOplatin (PARAPLATIN) 630 mg in sodium chloride 0.9 % 250 mL chemo infusion, 700 mg (100 % of original dose 700 mg),  Intravenous,  Once, 3 of 3 cycles Dose modification: 700 mg (original dose 700 mg, Cycle 5), 637.2 mg (original dose 700 mg, Cycle 6, Reason: Change in SCr/CrCl),   (original dose 700 mg, Cycle 6, Reason: Dose not tolerated) Administration: 630 mg (04/22/2020), 530 mg (05/17/2020), 530 mg (06/07/2020) cyclophosphamide (CYTOXAN) 980 mg in sodium chloride 0.9 % 250 mL chemo infusion, 600 mg/m2 = 980 mg, Intravenous,  Once, 4 of 4 cycles Administration: 980 mg (02/23/2020), 980 mg (03/08/2020), 980 mg (03/22/2020), 980 mg (04/05/2020) PACLitaxel (TAXOL) 132 mg in sodium chloride 0.9 % 250 mL chemo infusion (</= 97m/m2), 80 mg/m2 = 132 mg, Intravenous,  Once, 3 of 4 cycles Dose modification: 60 mg/m2 (original dose 80 mg/m2, Cycle 5, Reason: Dose not tolerated), 45 mg/m2 (original dose 80 mg/m2, Cycle 7, Reason: Provider Judgment) Administration: 132 mg (04/22/2020), 132 mg (05/03/2020), 96 mg (05/09/2020), 96 mg (05/17/2020), 96 mg (05/24/2020), 96 mg (05/31/2020), 96 mg (06/07/2020), 72 mg (06/14/2020) fosaprepitant (EMEND) 150 mg in sodium chloride 0.9 % 145 mL IVPB, 150 mg, Intravenous,  Once, 7 of 8 cycles Administration: 150 mg (02/23/2020), 150 mg (04/22/2020), 150 mg (03/08/2020), 150 mg (03/22/2020), 150 mg (04/05/2020), 150 mg (05/17/2020), 150 mg (06/07/2020)   for chemotherapy treatment.      Genetic Testing   Negative genetic testing. No pathogenic variants identified on the [facility] Common Hereditary Cancers Panel. The report date is 02/25/2020.   The Common Hereditary Cancers Panel offered by [facility] includes sequencing and/or deletion duplication testing of the following 48 genes: APC, ATM, AXIN2, BARD1, BMPR1A, BRCA1, BRCA2, BRIP1, CDH1, CDKN2A (p14ARF), CDKN2A (p16INK4a), CKD4, CHEK2, CTNNA1, DICER1, EPCAM (Deletion/duplication  testing only), GREM1 (promoter region deletion/duplication testing only), KIT, MEN1, MLH1, MSH2, MSH3, MSH6, MUTYH, NBN, NF1, NHTL1, PALB2, PDGFRA, PMS2, POLD1, POLE, PTEN, RAD50, RAD51C,  RAD51D, RNF43, SDHB, SDHC, SDHD, SMAD4, SMARCA4. STK11, TP53, TSC1, TSC2, and VHL.  The following genes were evaluated for sequence changes only: SDHA and HOXB13 c.251G>A variant only.   08/06/2020 Surgery   Bilateral mastectomies ([place]): no residual invasive carcinoma in the right breast and no evidence of malignancy on the left breast, with 2 right axillary lymph nodes negative for carcinoma.   08/06/2020 Cancer Staging   Staging form: Breast, AJCC 8th Edition - Pathologic stage from 08/06/2020: No Stage Recommended (ypT0, pN0, cM0) - Signed by Gardenia Phlegm, NP on 11/22/2020      CHIEF COMPLIANT: Follow-up of breast cancer  INTERVAL HISTORY: Virginia [name] is a 55 y.o. with above-mentioned history of breast cancer who completed neoadjuvant chemotherapy and underwent bilateral mastectomies. She presents to the clinic today for follow-up.  She has completed breast reconstruction in April and is doing quite well.  Does not have any major problems or concerns.  Her exercise levels have improved.  Taste and appetite have come back.  ALLERGIES:  has No Known Allergies.  MEDICATIONS:  Current Outpatient Medications  Medication Sig Dispense Refill   cetirizine (ZYRTEC) 10 MG tablet Take 10 mg by mouth daily.     escitalopram (LEXAPRO) 20 MG tablet TAKE 1 TABLET (20 MG TOTAL) BY MOUTH DAILY. 90 tablet 3   ibuprofen (ADVIL) 400 MG tablet Take 400 mg by mouth every 6 (six) hours as needed.     omeprazole (PRILOSEC) 40 MG capsule Take 1 capsule (40 mg total) by mouth daily. 30 capsule 3   No current facility-administered medications for this visit.    PHYSICAL EXAMINATION: ECOG PERFORMANCE STATUS: 1 - Symptomatic but completely ambulatory  Vitals:   05/22/21 1535  BP: 102/71  Pulse: 97  Resp: 18  Temp: 97.7 F (36.5 C)  SpO2: 99%   Filed Weights   05/22/21 1535  Weight: 144 lb 7 oz (65.5 kg)    LABORATORY DATA:  I have reviewed the data as listed CMP Latest Ref  Rng & Units 05/13/2021 08/02/2020 06/21/2020  Glucose 65 - 99 mg/dL 87 73 87  BUN 6 - 24 mg/dL _0 Creatinine 0.57 - 1.00 mg/dL 0.71 0.72 0.66  Sodium 134 - 144 mmol/L 141 138 138  Potassium 3.5 - 5.2 mmol/L 4.4 4.5 4.0  Chloride 96 - 106 mmol/L 102 105 106  CO2 20 - 29 mmol/L _1 Calcium 8.7 - 10.2 mg/dL 9.4 9.3 9.5  Total Protein 6.0 - 8.5 g/dL 6.7 6.5 6.3(L)  Total Bilirubin 0.0 - 1.2 mg/dL 0.3 0.6 0.4  Alkaline Phos 44 - 121 IU/L 74 64 67  AST 0 - 40 IU/L 18 30 37  ALT 0 - 32 IU/L 16 40 52(H)    Lab Results  Component Value Date   WBC 5.6 05/13/2021   HGB 12.3 05/13/2021   HCT 36.6 05/13/2021   MCV 87 05/13/2021   PLT 193 05/13/2021   NEUTROABS 2.8 05/13/2021    ASSESSMENT & PLAN:  Malignant neoplasm of upper-outer quadrant of right breast in female, estrogen receptor negative ([place]) 02/02/2020:Right breast lump tenderness. Diagnostic mammogram and [place] on 02/01/20 showed a 2.0cm mass at the 10 o'clock position with no axillary adenopathy. 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 FISH negative, ER/PR negative, Ki67 80% T1CN0 stage  Ib   Treatment: 1. Neoadjuvant chemotherapy with dose dense Adriamycin and Cytoxan x4 followed by Taxol and carboplatin x7 discontinued 06/14/2020 2. 08/06/2020: Bilateral mastectomies with reconstruction right mastectomy: No residual invasive cancer, 0/2 sentinel lymph nodes negative: Pathologic complete response Left mastectomy: Benign (breast reconstruction completed April 2022)  Breast cancer surveillance: 1.  Breast exam 05/22/2021: Benign (bilateral mastectomies with reconstruction) 2. no role of imaging studies since she had bilateral mastectomies  Return to clinic in 1 year for follow-up    No orders of the defined types were placed in this encounter.  The patient has a good understanding of the overall plan. she agrees with it. she will call with any problems that may develop before the next visit  here.  Total time spent: 20 mins including face to face time and time spent for planning, charting and coordination of care  Rulon Eisenmenger, MD, MPH 05/22/2021  I, Thana Ates, am acting as scribe for Dr. Nicholas Lose.  I have reviewed the above documentation for accuracy and completeness, and I agree with the above.

## 2021-05-22 ENCOUNTER — Other Ambulatory Visit: Payer: Self-pay

## 2021-05-22 ENCOUNTER — Inpatient Hospital Stay: Payer: 59 | Attending: Hematology and Oncology | Admitting: Hematology and Oncology

## 2021-05-22 DIAGNOSIS — Z9013 Acquired absence of bilateral breasts and nipples: Secondary | ICD-10-CM | POA: Diagnosis not present

## 2021-05-22 DIAGNOSIS — Z9221 Personal history of antineoplastic chemotherapy: Secondary | ICD-10-CM | POA: Diagnosis not present

## 2021-05-22 DIAGNOSIS — R531 Weakness: Secondary | ICD-10-CM | POA: Insufficient documentation

## 2021-05-22 DIAGNOSIS — Z803 Family history of malignant neoplasm of breast: Secondary | ICD-10-CM | POA: Diagnosis not present

## 2021-05-22 DIAGNOSIS — Z79899 Other long term (current) drug therapy: Secondary | ICD-10-CM | POA: Insufficient documentation

## 2021-05-22 DIAGNOSIS — Z801 Family history of malignant neoplasm of trachea, bronchus and lung: Secondary | ICD-10-CM | POA: Insufficient documentation

## 2021-05-22 DIAGNOSIS — Z171 Estrogen receptor negative status [ER-]: Secondary | ICD-10-CM | POA: Insufficient documentation

## 2021-05-22 DIAGNOSIS — Z800 Family history of malignant neoplasm of digestive organs: Secondary | ICD-10-CM | POA: Insufficient documentation

## 2021-05-22 DIAGNOSIS — C50411 Malignant neoplasm of upper-outer quadrant of right female breast: Secondary | ICD-10-CM | POA: Diagnosis not present

## 2021-05-22 NOTE — Assessment & Plan Note (Signed)
02/02/2020:Right breast lump tenderness. Diagnostic mammogram and [place] on 02/01/20 showed a 2.0cm mass at the 10 o'clock position with no axillary adenopathy. 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FISH negative, ER/PR negative, Ki67 80% T1CN0 stage Ib  Treatment: 1.Neoadjuvant chemotherapywith dose dense Adriamycin and Cytoxan x4 followed by Taxol and carboplatinx7 discontinued 06/14/2020 2.08/06/2020: Bilateral mastectomies with reconstruction right mastectomy: No residual invasive cancer, 0/2 sentinel lymph nodes negative: Pathologic complete response Left mastectomy: Benign (breast reconstruction completed April 2022)  Breast cancer surveillance: 1.  Breast exam 05/22/2021: Benign (bilateral mastectomies with reconstruction) 2. no role of imaging studies since she had bilateral mastectomies  Return to clinic in 1 year for follow-up

## 2021-05-29 ENCOUNTER — Encounter: Payer: Self-pay | Admitting: Hematology and Oncology

## 2021-05-30 ENCOUNTER — Encounter: Payer: Self-pay | Admitting: Hematology and Oncology

## 2021-06-02 ENCOUNTER — Encounter: Payer: Self-pay | Admitting: Nurse Practitioner

## 2021-06-02 ENCOUNTER — Inpatient Hospital Stay (HOSPITAL_BASED_OUTPATIENT_CLINIC_OR_DEPARTMENT_OTHER): Payer: 59 | Admitting: Nurse Practitioner

## 2021-06-02 ENCOUNTER — Inpatient Hospital Stay: Payer: 59 | Admitting: Adult Health

## 2021-06-02 ENCOUNTER — Other Ambulatory Visit: Payer: Self-pay

## 2021-06-02 VITALS — BP 99/67 | HR 93 | Temp 98.8°F | Resp 18 | Ht 61.0 in | Wt 144.7 lb

## 2021-06-02 DIAGNOSIS — Z803 Family history of malignant neoplasm of breast: Secondary | ICD-10-CM | POA: Diagnosis not present

## 2021-06-02 DIAGNOSIS — Z800 Family history of malignant neoplasm of digestive organs: Secondary | ICD-10-CM | POA: Diagnosis not present

## 2021-06-02 DIAGNOSIS — Z171 Estrogen receptor negative status [ER-]: Secondary | ICD-10-CM | POA: Diagnosis not present

## 2021-06-02 DIAGNOSIS — Z9221 Personal history of antineoplastic chemotherapy: Secondary | ICD-10-CM | POA: Diagnosis not present

## 2021-06-02 DIAGNOSIS — C50411 Malignant neoplasm of upper-outer quadrant of right female breast: Secondary | ICD-10-CM

## 2021-06-02 DIAGNOSIS — Z9013 Acquired absence of bilateral breasts and nipples: Secondary | ICD-10-CM | POA: Diagnosis not present

## 2021-06-02 DIAGNOSIS — Z801 Family history of malignant neoplasm of trachea, bronchus and lung: Secondary | ICD-10-CM | POA: Diagnosis not present

## 2021-06-02 DIAGNOSIS — R531 Weakness: Secondary | ICD-10-CM | POA: Diagnosis not present

## 2021-06-02 DIAGNOSIS — Z79899 Other long term (current) drug therapy: Secondary | ICD-10-CM | POA: Diagnosis not present

## 2021-06-02 NOTE — Progress Notes (Addendum)
[place]   [phone] [phone]   Clinic Follow up Note   Patient Care Team: Virginia Crews, MD as PCP - General (Family Medicine) Erroll Luna, MD as Consulting Physician (General Surgery) Nicholas Lose, MD as Consulting Physician (Hematology and Oncology) Irene Limbo, MD as Consulting Physician (Plastic Surgery) 06/02/2021  CHIEF COMPLAINT: right breast change   SUMMARY OF ONCOLOGIC HISTORY: Oncology History  Malignant neoplasm of upper-outer quadrant of right breast in female, estrogen receptor negative ([place])  02/02/2020 Initial Diagnosis   Right breast lump tenderness. Diagnostic mammogram and [place] on 02/01/20 showed a 2.0cm mass at the 10 o'clock position with no axillary adenopathy. 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, ER/PR negative, Ki67 80%   02/08/2020 Cancer Staging   Staging form: Breast, AJCC 8th Edition - Clinical stage from 02/08/2020: Stage IB (cT1c, cN0, cM0, G3, ER-, PR-, HER2-) - Signed by Nicholas Lose, MD on 02/08/2020    02/23/2020 -  Chemotherapy   The patient had dexamethasone (DECADRON) 4 MG tablet, 1 of 1 cycle, Start date: 02/08/2020, End date: 04/22/2020 DOXOrubicin (ADRIAMYCIN) chemo injection 98 mg, 60 mg/m2 = 98 mg, Intravenous,  Once, 4 of 4 cycles Administration: 98 mg (02/23/2020), 98 mg (03/08/2020), 98 mg (03/22/2020), 98 mg (04/05/2020) palonosetron (ALOXI) injection 0.25 mg, 0.25 mg, Intravenous,  Once, 7 of 8 cycles Administration: 0.25 mg (02/23/2020), 0.25 mg (04/22/2020), 0.25 mg (03/08/2020), 0.25 mg (03/22/2020), 0.25 mg (04/05/2020), 0.25 mg (05/17/2020), 0.25 mg (06/07/2020) pegfilgrastim (NEULASTA ONPRO KIT) injection 6 mg, 6 mg, Subcutaneous, Once, 4 of 4 cycles Administration: 6 mg (02/23/2020), 6 mg (03/08/2020), 6 mg (03/22/2020), 6 mg (04/05/2020) CARBOplatin (PARAPLATIN) 630 mg in sodium chloride 0.9 % 250 mL chemo infusion, 700 mg (100 % of original dose 700 mg), Intravenous,   Once, 3 of 3 cycles Dose modification: 700 mg (original dose 700 mg, Cycle 5), 637.2 mg (original dose 700 mg, Cycle 6, Reason: Change in SCr/CrCl),   (original dose 700 mg, Cycle 6, Reason: Dose not tolerated) Administration: 630 mg (04/22/2020), 530 mg (05/17/2020), 530 mg (06/07/2020) cyclophosphamide (CYTOXAN) 980 mg in sodium chloride 0.9 % 250 mL chemo infusion, 600 mg/m2 = 980 mg, Intravenous,  Once, 4 of 4 cycles Administration: 980 mg (02/23/2020), 980 mg (03/08/2020), 980 mg (03/22/2020), 980 mg (04/05/2020) PACLitaxel (TAXOL) 132 mg in sodium chloride 0.9 % 250 mL chemo infusion (</= 63m/m2), 80 mg/m2 = 132 mg, Intravenous,  Once, 3 of 4 cycles Dose modification: 60 mg/m2 (original dose 80 mg/m2, Cycle 5, Reason: Dose not tolerated), 45 mg/m2 (original dose 80 mg/m2, Cycle 7, Reason: Provider Judgment) Administration: 132 mg (04/22/2020), 132 mg (05/03/2020), 96 mg (05/09/2020), 96 mg (05/17/2020), 96 mg (05/24/2020), 96 mg (05/31/2020), 96 mg (06/07/2020), 72 mg (06/14/2020) fosaprepitant (EMEND) 150 mg in sodium chloride 0.9 % 145 mL IVPB, 150 mg, Intravenous,  Once, 7 of 8 cycles Administration: 150 mg (02/23/2020), 150 mg (04/22/2020), 150 mg (03/08/2020), 150 mg (03/22/2020), 150 mg (04/05/2020), 150 mg (05/17/2020), 150 mg (06/07/2020)   for chemotherapy treatment.      Genetic Testing   Negative genetic testing. No pathogenic variants identified on the [facility] Common Hereditary Cancers Panel. The report date is 02/25/2020.   The Common Hereditary Cancers Panel offered by [facility] includes sequencing and/or deletion duplication testing of the following 48 genes: APC, ATM, AXIN2, BARD1, BMPR1A, BRCA1, BRCA2, BRIP1, CDH1, CDKN2A (p14ARF), CDKN2A (p16INK4a), CKD4, CHEK2, CTNNA1, DICER1, EPCAM (Deletion/duplication testing only), GREM1 (promoter region deletion/duplication testing only), KIT,  MEN1, MLH1, MSH2, MSH3, MSH6, MUTYH, NBN, NF1, NHTL1, PALB2, PDGFRA, PMS2, POLD1, POLE, PTEN, RAD50, RAD51C, RAD51D, RNF43, SDHB,  SDHC, SDHD, SMAD4, SMARCA4. STK11, TP53, TSC1, TSC2, and VHL.  The following genes were evaluated for sequence changes only: SDHA and HOXB13 c.251G>A variant only.   08/06/2020 Surgery   Bilateral mastectomies ([place]): no residual invasive carcinoma in the right breast and no evidence of malignancy on the left breast, with 2 right axillary lymph nodes negative for carcinoma.   08/06/2020 Cancer Staging   Staging form: Breast, AJCC 8th Edition - Pathologic stage from 08/06/2020: No Stage Recommended (ypT0, pN0, cM0) - Signed by Gardenia Phlegm, NP on 11/22/2020      CURRENT THERAPY: Observation   INTERVAL HISTORY: Ms. Huneycutt presents for work in visit for right breast change. She was seen by Dr. Lindi Adie 05/22/21. This weekend she noticed a burning bump in right lower breast. Not sure if it has been there. Thought it looked as if it had been red or bleeding. Husband says it's a mole but she doesn't remember it ever being there. She is going on vacation and wanted it looked at. She also notes mild RUE weakness since mastectomy. Not getting worse, limiting ADLs or function, and not associated with bone pain.   All other systems were reviewed with the patient and are negative.  MEDICAL HISTORY:  Past Medical History:  Diagnosis Date   Anorexia    Anxiety    Arthritis    knees   Cancer ([place])    R Breast Cancer 2021   Family history of breast cancer    Family history of lung cancer    Family history of pancreatic cancer    Fibroids    Wears glasses     SURGICAL HISTORY: Past Surgical History:  Procedure Laterality Date   BREAST RECONSTRUCTION WITH PLACEMENT OF TISSUE EXPANDER AND ALLODERM Bilateral 08/06/2020   Procedure: BILATERAL BREAST RECONSTRUCTION WITH PLACEMENT OF TISSUE EXPANDER AND ALLODERM;  Surgeon: Irene Limbo, MD;  Location: [facility];  Service: Plastics;  Laterality: Bilateral;   BREAST RECONSTRUCTION WITH PLACEMENT OF TISSUE EXPANDER AND  ALLODERM Right 11/26/2020   Procedure: BREAST RECONSTRUCTION WITH PLACEMENT OF TISSUE EXPANDER AND ALLODERM;  Surgeon: Irene Limbo, MD;  Location: [facility];  Service: Plastics;  Laterality: Right;   CLEFT PALATE REPAIR     CLEFT PALATE REPAIR  1970   COLONOSCOPY WITH PROPOFOL N/A 04/25/2018   Procedure: COLONOSCOPY WITH PROPOFOL;  Surgeon: [name], MD;  Location: [facility];  Service: Endoscopy;  Laterality: N/A;   LIPOSUCTION WITH LIPOFILLING Bilateral 02/21/2021   Procedure: LIPOSUCTION FROM ABDOMEN AND BILATERAL FLANKS; LIPOFILLING TO BILATERAL CHEST;  Surgeon: Irene Limbo, MD;  Location: [facility];  Service: Plastics;  Laterality: Bilateral;   NIPPLE SPARING MASTECTOMY WITH SENTINEL LYMPH NODE BIOPSY Bilateral 08/06/2020   Procedure: BILATERAL NIPPLE SPARING MASTECTOMIES WITH RIGHT AXILLARY  SENTINEL LYMPH NODE MAPPING;  Surgeon: Erroll Luna, MD;  Location: [facility];  Service: General;  Laterality: Bilateral;  PECTORAL BLOCK   PORT-A-CATH REMOVAL Right 08/06/2020   Procedure: REMOVAL PORT-A-CATH;  Surgeon: Erroll Luna, MD;  Location: [facility];  Service: General;  Laterality: Right;   PORTACATH PLACEMENT Right 02/22/2020   Procedure: INSERTION PORT-A-CATH WITH ULTRASOUND GUIDANCE;  Surgeon: Erroll Luna, MD;  Location: [facility];  Service: General;  Laterality: Right;   REMOVAL OF BILATERAL TISSUE EXPANDERS WITH PLACEMENT OF BILATERAL BREAST IMPLANTS Bilateral 02/21/2021   Procedure:  REMOVAL OF BILATERAL TISSUE EXPANDERS WITH PLACEMENT OF BILATERAL BREAST SILICONE IMPLANTS;  Surgeon: Irene Limbo, MD;  Location: [facility];  Service: Plastics;  Laterality: Bilateral;   REMOVAL OF TISSUE EXPANDER AND PLACEMENT OF IMPLANT Bilateral 08/25/2020   Procedure: REMOVAL OF RIGHT BREAST TISSUE EXPANDER; DEBRIDEMENT BILATERAL MASTECTOMY FLAP NIPPLES;  Surgeon: Irene Limbo, MD;  Location: [facility];  Service: Plastics;  Laterality: Bilateral;    I have reviewed the social history and family history with the patient and they are unchanged from previous note.  ALLERGIES:  has No Known Allergies.  MEDICATIONS:  Current Outpatient Medications  Medication Sig Dispense Refill   Cholecalciferol (VITAMIN D3) 250 MCG (10000 UT) TABS Take by mouth.     cetirizine (ZYRTEC) 10 MG tablet Take 10 mg by mouth daily.     escitalopram (LEXAPRO) 20 MG tablet TAKE 1 TABLET (20 MG TOTAL) BY MOUTH DAILY. 90 tablet 3   ibuprofen (ADVIL) 400 MG tablet Take 400 mg by mouth every 6 (six) hours as needed.     omeprazole (PRILOSEC) 40 MG capsule Take 1 capsule (40 mg total) by mouth daily. 30 capsule 3   No current facility-administered medications for this visit.    PHYSICAL EXAMINATION:  Vitals:   06/02/21 1311  BP: 99/67  Pulse: 93  Resp: 18  Temp: 98.8 F (37.1 C)  SpO2: 99%   Filed Weights   06/02/21 1311  Weight: 144 lb 11.2 oz (65.6 kg)    GENERAL:alert, no distress and comfortable SKIN: multiple nevi, no rash EYES: sclera clear NECK: without mass LYMPH:  no palpable cervical, supraclavicular, or axillary lymphadenopathy  LUNGS:  normal breathing effort HEART: no lower extremity edema NEURO: alert & oriented x 3 with fluent speech Breast exam: s/p bilateral nipple sparing mastectomy and reconstruction. No nipple discharge or inversion. At the area of concern superior to the mastectomy scar/incision there is a small 2-3 mm nevus with raised center in the right lower reconstructed breast. Appearance is similar to multiple other nevi on chest and abdomen. No palpable mass in either breast or axilla that I could appreciate.     LABORATORY DATA:  No labs for this visit   RADIOGRAPHIC STUDIES: I have personally reviewed the radiological images as listed and agreed with the findings in the report. No results found.   ASSESSMENT & PLAN: 55 yo female   1.invasive mammary  carcinoma, stage IB (cT1cN0M0), grade 3, triple negative, ki67 80% -Diagnosed 01/2020, s/p neoadjuvant chemo AC-T followed by bilateral mastectomy in 07/2020 and reconstruction, path showed complete pathologic response. -Genetics-negative -On surveillance, last seen by Dr. Lindi Adie 05/22/2021  Disposition: Ms. Nicodemus appears well.  Breast exam is benign, the area of concern shows a small nevus with a raised center.  The appearance is similar to her multiple other nevi on the chest and abdomen.  This is likely benign. We discussed the possibility that triple negative breast cancer can recur on the skin, but the presentation does not support that.  She and her husband will monitor the area and let us know if the area grows or changes, or she develops new concerns.  F/up with Dr. Lindi Adie as scheduled, or sooner if needed.   Patient seen with Dr. Burr Medico.   All questions were answered. The patient knows to call the clinic with any problems, questions or concerns. No barriers to learning were detected.     Alla Feeling, NP 06/02/21    Addendum  Pt was seen with  NP Cray Monnin today, chart reviewed. She had triple negative stage IB right breast cancer in 2021, s/p neoadjuvant chemo and bilateral mastectomy, with complete pathological response. Her new skin lesion at the 6 o'clock position of right breastis likely benign,although close monitoring is warranted due to her history of breast cancer. She knows to call us if it gets bigger or if she notice more new skin lesions. She will also f/u with her dermatologist for monitoring of her skin moles.   Truitt Merle  06/02/2021

## 2021-06-16 DIAGNOSIS — Z9012 Acquired absence of left breast and nipple: Secondary | ICD-10-CM | POA: Diagnosis not present

## 2021-06-16 DIAGNOSIS — C50411 Malignant neoplasm of upper-outer quadrant of right female breast: Secondary | ICD-10-CM | POA: Diagnosis not present

## 2021-06-20 ENCOUNTER — Other Ambulatory Visit (HOSPITAL_COMMUNITY): Payer: Self-pay

## 2021-06-20 MED ORDER — CARESTART COVID-19 HOME TEST VI KIT
PACK | 0 refills | Status: DC
  Filled 2021-06-20: qty 4, 4d supply, fill #0

## 2021-07-16 ENCOUNTER — Encounter: Payer: Self-pay | Admitting: Hematology and Oncology

## 2021-07-17 ENCOUNTER — Encounter: Payer: Self-pay | Admitting: Hematology and Oncology

## 2021-07-20 NOTE — Progress Notes (Signed)
Patient Care Team: Virginia Crews, MD as PCP - General (Family Medicine) Erroll Luna, MD as Consulting Physician (General Surgery) Nicholas Lose, MD as Consulting Physician (Hematology and Oncology) Irene Limbo, MD as Consulting Physician (Plastic Surgery)  DIAGNOSIS:    ICD-10-CM   1. Malignant neoplasm of upper-outer quadrant of right breast in female, estrogen receptor negative ([place])  C50.411    Z17.1       SUMMARY OF ONCOLOGIC HISTORY: Oncology History  Malignant neoplasm of upper-outer quadrant of right breast in female, estrogen receptor negative ([place])  02/02/2020 Initial Diagnosis   Right breast lump tenderness. Diagnostic mammogram and [place] on 02/01/20 showed a 2.0cm mass at the 10 o'clock position with no axillary adenopathy. 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, ER/PR negative, Ki67 80%   02/08/2020 Cancer Staging   Staging form: Breast, AJCC 8th Edition - Clinical stage from 02/08/2020: Stage IB (cT1c, cN0, cM0, G3, ER-, PR-, HER2-) - Signed by Nicholas Lose, MD on 02/08/2020   02/23/2020 -  Chemotherapy   The patient had dexamethasone (DECADRON) 4 MG tablet, 1 of 1 cycle, Start date: 02/08/2020, End date: 04/22/2020 DOXOrubicin (ADRIAMYCIN) chemo injection 98 mg, 60 mg/m2 = 98 mg, Intravenous,  Once, 4 of 4 cycles Administration: 98 mg (02/23/2020), 98 mg (03/08/2020), 98 mg (03/22/2020), 98 mg (04/05/2020) palonosetron (ALOXI) injection 0.25 mg, 0.25 mg, Intravenous,  Once, 7 of 8 cycles Administration: 0.25 mg (02/23/2020), 0.25 mg (04/22/2020), 0.25 mg (03/08/2020), 0.25 mg (03/22/2020), 0.25 mg (04/05/2020), 0.25 mg (05/17/2020), 0.25 mg (06/07/2020) pegfilgrastim (NEULASTA ONPRO KIT) injection 6 mg, 6 mg, Subcutaneous, Once, 4 of 4 cycles Administration: 6 mg (02/23/2020), 6 mg (03/08/2020), 6 mg (03/22/2020), 6 mg (04/05/2020) CARBOplatin (PARAPLATIN) 630 mg in sodium chloride 0.9 % 250 mL chemo infusion, 700 mg (100 % of original dose 700 mg),  Intravenous,  Once, 3 of 3 cycles Dose modification: 700 mg (original dose 700 mg, Cycle 5), 637.2 mg (original dose 700 mg, Cycle 6, Reason: Change in SCr/CrCl),   (original dose 700 mg, Cycle 6, Reason: Dose not tolerated) Administration: 630 mg (04/22/2020), 530 mg (05/17/2020), 530 mg (06/07/2020) cyclophosphamide (CYTOXAN) 980 mg in sodium chloride 0.9 % 250 mL chemo infusion, 600 mg/m2 = 980 mg, Intravenous,  Once, 4 of 4 cycles Administration: 980 mg (02/23/2020), 980 mg (03/08/2020), 980 mg (03/22/2020), 980 mg (04/05/2020) PACLitaxel (TAXOL) 132 mg in sodium chloride 0.9 % 250 mL chemo infusion (</= 7m/m2), 80 mg/m2 = 132 mg, Intravenous,  Once, 3 of 4 cycles Dose modification: 60 mg/m2 (original dose 80 mg/m2, Cycle 5, Reason: Dose not tolerated), 45 mg/m2 (original dose 80 mg/m2, Cycle 7, Reason: Provider Judgment) Administration: 132 mg (04/22/2020), 132 mg (05/03/2020), 96 mg (05/09/2020), 96 mg (05/17/2020), 96 mg (05/24/2020), 96 mg (05/31/2020), 96 mg (06/07/2020), 72 mg (06/14/2020) fosaprepitant (EMEND) 150 mg in sodium chloride 0.9 % 145 mL IVPB, 150 mg, Intravenous,  Once, 7 of 8 cycles Administration: 150 mg (02/23/2020), 150 mg (04/22/2020), 150 mg (03/08/2020), 150 mg (03/22/2020), 150 mg (04/05/2020), 150 mg (05/17/2020), 150 mg (06/07/2020)   for chemotherapy treatment.      Genetic Testing   Negative genetic testing. No pathogenic variants identified on the [facility] Common Hereditary Cancers Panel. The report date is 02/25/2020.   The Common Hereditary Cancers Panel offered by [facility] includes sequencing and/or deletion duplication testing of the following 48 genes: APC, ATM, AXIN2, BARD1, BMPR1A, BRCA1, BRCA2, BRIP1, CDH1, CDKN2A (p14ARF), CDKN2A (p16INK4a), CKD4, CHEK2, CTNNA1, DICER1, EPCAM (Deletion/duplication testing  only), GREM1 (promoter region deletion/duplication testing only), KIT, MEN1, MLH1, MSH2, MSH3, MSH6, MUTYH, NBN, NF1, NHTL1, PALB2, PDGFRA, PMS2, POLD1, POLE, PTEN, RAD50, RAD51C,  RAD51D, RNF43, SDHB, SDHC, SDHD, SMAD4, SMARCA4. STK11, TP53, TSC1, TSC2, and VHL.  The following genes were evaluated for sequence changes only: SDHA and HOXB13 c.251G>A variant only.   08/06/2020 Surgery   Bilateral mastectomies ([place]): no residual invasive carcinoma in the right breast and no evidence of malignancy on the left breast, with 2 right axillary lymph nodes negative for carcinoma.   08/06/2020 Cancer Staging   Staging form: Breast, AJCC 8th Edition - Pathologic stage from 08/06/2020: No Stage Recommended (ypT0, pN0, cM0) - Signed by Gardenia Phlegm, NP on 11/22/2020     CHIEF COMPLIANT: Complains of a palpable spot in the right breast outer aspect  INTERVAL HISTORY: Virginia [name] is a 55 y.o. with above-mentioned history of  breast cancer who completed neoadjuvant chemotherapy and underwent bilateral mastectomies. She presents to the clinic today for follow-up to discuss recent palpable finding in the right breast around 3 o'clock position.  It was not causing her any problems or concerns but she wanted to be checked out.   ALLERGIES:  has No Known Allergies.  MEDICATIONS:  Current Outpatient Medications  Medication Sig Dispense Refill   cetirizine (ZYRTEC) 10 MG tablet Take 10 mg by mouth daily.     Cholecalciferol (VITAMIN D3) 250 MCG (10000 UT) TABS Take by mouth.     COVID-19 At Home Antigen Test [facility] COVID-19 HOME TEST) KIT use as directed 4 each 0   escitalopram (LEXAPRO) 20 MG tablet TAKE 1 TABLET (20 MG TOTAL) BY MOUTH DAILY. 90 tablet 3   ibuprofen (ADVIL) 400 MG tablet Take 400 mg by mouth every 6 (six) hours as needed.     omeprazole (PRILOSEC) 40 MG capsule Take 1 capsule (40 mg total) by mouth daily. 30 capsule 3   No current facility-administered medications for this visit.    PHYSICAL EXAMINATION: ECOG PERFORMANCE STATUS: 0 - Asymptomatic  Vitals:   07/22/21 1514  BP: 126/81  Pulse: (!) 109  Resp: 18  Temp: 97.8 F (36.6 C)   SpO2: 99%   Filed Weights   07/22/21 1514  Weight: 144 lb 1.6 oz (65.4 kg)    BREAST: No palpable lumps or nodules of concern.  The deep palpation resulted in a slight lumpiness but it is not characteristic of any recurrence of breast cancer.  (exam performed in the presence of a chaperone)  LABORATORY DATA:  I have reviewed the data as listed CMP Latest Ref Rng & Units 05/13/2021 08/02/2020 06/21/2020  Glucose 65 - 99 mg/dL 87 73 87  BUN 6 - 24 mg/dL _0 Creatinine 0.57 - 1.00 mg/dL 0.71 0.72 0.66  Sodium 134 - 144 mmol/L 141 138 138  Potassium 3.5 - 5.2 mmol/L 4.4 4.5 4.0  Chloride 96 - 106 mmol/L 102 105 106  CO2 20 - 29 mmol/L _1 Calcium 8.7 - 10.2 mg/dL 9.4 9.3 9.5  Total Protein 6.0 - 8.5 g/dL 6.7 6.5 6.3(L)  Total Bilirubin 0.0 - 1.2 mg/dL 0.3 0.6 0.4  Alkaline Phos 44 - 121 IU/L 74 64 67  AST 0 - 40 IU/L 18 30 37  ALT 0 - 32 IU/L 16 40 52(H)    Lab Results  Component Value Date   WBC 5.6 05/13/2021   HGB 12.3 05/13/2021   HCT 36.6 05/13/2021   MCV 87 05/13/2021  PLT 193 05/13/2021   NEUTROABS 2.8 05/13/2021    ASSESSMENT & PLAN:  Malignant neoplasm of upper-outer quadrant of right breast in female, estrogen receptor negative ([place]) 02/02/2020:Right breast lump tenderness. Diagnostic mammogram and [place] on 02/01/20 showed a 2.0cm mass at the 10 o'clock position with no axillary adenopathy. 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 FISH negative, ER/PR negative, Ki67 80% T1CN0 stage Ib   Treatment: 1. Neoadjuvant chemotherapy with dose dense Adriamycin and Cytoxan x4 followed by Taxol and carboplatin x7 discontinued 06/14/2020 2. 08/06/2020: Bilateral mastectomies with reconstruction right mastectomy: No residual invasive cancer, 0/2 sentinel lymph nodes negative: Pathologic complete response Left mastectomy: Benign (breast reconstruction completed April 2022)   Breast cancer surveillance: 1.  Breast exam 05/22/2021: Benign (bilateral  mastectomies with reconstruction) 2. no role of imaging studies since she had bilateral mastectomies  Multiple skin nevi Return to clinic in 1 year for follow-up    No orders of the defined types were placed in this encounter.  The patient has a good understanding of the overall plan. she agrees with it. she will call with any problems that may develop before the next visit here.  Total time spent: 20 mins including face to face time and time spent for planning, charting and coordination of care  Rulon Eisenmenger, MD, MPH 07/22/2021  I, Thana Ates, am acting as scribe for Dr. Nicholas Lose.  I have reviewed the above documentation for accuracy and completeness, and I agree with the above.

## 2021-07-22 ENCOUNTER — Encounter: Payer: Self-pay | Admitting: Hematology and Oncology

## 2021-07-22 ENCOUNTER — Other Ambulatory Visit: Payer: Self-pay

## 2021-07-22 ENCOUNTER — Inpatient Hospital Stay: Payer: BC Managed Care – PPO | Attending: Hematology and Oncology | Admitting: Hematology and Oncology

## 2021-07-22 DIAGNOSIS — Z171 Estrogen receptor negative status [ER-]: Secondary | ICD-10-CM | POA: Diagnosis not present

## 2021-07-22 DIAGNOSIS — C50411 Malignant neoplasm of upper-outer quadrant of right female breast: Secondary | ICD-10-CM | POA: Insufficient documentation

## 2021-07-22 DIAGNOSIS — Z79899 Other long term (current) drug therapy: Secondary | ICD-10-CM | POA: Diagnosis not present

## 2021-07-22 DIAGNOSIS — Z9013 Acquired absence of bilateral breasts and nipples: Secondary | ICD-10-CM | POA: Diagnosis not present

## 2021-07-22 NOTE — Assessment & Plan Note (Signed)
02/02/2020:Right breast lump tenderness. Diagnostic mammogram and [place] on 02/01/20 showed a 2.0cm mass at the 10 o'clock position with no axillary adenopathy. 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FISH negative, ER/PR negative, Ki67 80% T1CN0 stage Ib  Treatment: 1.Neoadjuvant chemotherapywith dose dense Adriamycin and Cytoxan x4 followed by Taxol and carboplatinx7 discontinued 06/14/2020 2.08/06/2020:Bilateral mastectomies with reconstruction right mastectomy: No residual invasive cancer, 0/2 sentinel lymph nodes negative: Pathologic complete response Left mastectomy: Benign (breast reconstruction completed April 2022)  Breast cancer surveillance: 1.  Breast exam 05/22/2021: Benign (bilateral mastectomies with reconstruction) 2. no role of imaging studies since she had bilateral mastectomies  Multiple skin nevi Return to clinic in 1 year for follow-up

## 2021-08-01 ENCOUNTER — Other Ambulatory Visit (HOSPITAL_COMMUNITY): Payer: Self-pay

## 2021-09-16 ENCOUNTER — Encounter: Payer: Self-pay | Admitting: Hematology and Oncology

## 2021-09-25 ENCOUNTER — Encounter: Payer: Self-pay | Admitting: Hematology and Oncology

## 2021-09-30 NOTE — Progress Notes (Signed)
MyChart Video Visit    Virtual Visit via Video Note   This visit type was conducted due to national recommendations for restrictions regarding the COVID-19 Pandemic (e.g. social distancing) in an effort to limit this patient's exposure and mitigate transmission in our community. This patient is at least at moderate risk for complications without adequate follow up. This format is felt to be most appropriate for this patient at this time. Physical exam was limited by quality of the video and audio technology used for the visit.   Patient location: home Provider location: [facility]  I discussed the limitations of evaluation and management by telemedicine and the availability of in person appointments. The patient expressed understanding and agreed to proceed.  Patient: Virginia [name]   DOB: November 24, 1965   55 y.o. Female  MRN: [id number] Visit Date: 10/01/2021  Today's healthcare provider: [name], MD   Chief Complaint  Patient presents with   Sinus Problem   Subjective    Sinus Problem Episode onset: 3 weeks ago. Associated symptoms include ear pain and headaches. Treatments tried: DayQuil and NyQuil. The treatment provided moderate relief.   Had a negative Covid test. OTC meds helps, but every time she stops symptoms come back worse. No fever, chills, sweats, cough or dyspnea.    Medications: Outpatient Medications Prior to Visit  Medication Sig   cetirizine (ZYRTEC) 10 MG tablet Take 10 mg by mouth daily.   Cholecalciferol (VITAMIN D3) 250 MCG (10000 UT) TABS Take by mouth.   COVID-19 At Home Antigen Test [facility] COVID-19 HOME TEST) KIT use as directed   escitalopram (LEXAPRO) 20 MG tablet TAKE 1 TABLET (20 MG TOTAL) BY MOUTH DAILY.   ibuprofen (ADVIL) 400 MG tablet Take 400 mg by mouth every 6 (six) hours as needed.   omeprazole (PRILOSEC) 40 MG capsule Take 1 capsule (40 mg total) by mouth daily.   No facility-administered medications prior to visit.    Review of  Systems  HENT:  Positive for ear pain.   Neurological:  Positive for headaches.     Objective    LMP 06/24/2019 (Approximate)    Physical Exam   Awake, alert, oriented x 3. In no apparent distress   Assessment & Plan     1. Left ear pain May be secondary OM due to nearly 2 weeks of sinus congestion.  - amoxicillin (AMOXIL) 500 MG capsule; Take 2 capsules (1,000 mg total) by mouth 2 (two) times daily for 10 days.  Dispense: 40 capsule; Refill: 0  2. Sinus congestion Vasomotor rhinitis versus allergic rhinitis, versus URI.  - fluticasone (FLONASE) 50 MCG/ACT nasal spray; Place 2 sprays into both nostrils daily.  Dispense: 16 g; Refill: 0       I discussed the assessment and treatment plan with the patient. The patient was provided an opportunity to ask questions and all were answered. The patient agreed with the plan and demonstrated an understanding of the instructions.   The patient was advised to call back or seek an in-person evaluation if the symptoms worsen or if the condition fails to improve as anticipated.  I provided 9 minutes of non-face-to-face time during this encounter.  The entirety of the information documented in the History of Present Illness, Review of Systems and Physical Exam were personally obtained by me. Portions of this information were initially documented by the [name] and reviewed by me for thoroughness and accuracy.    [name], MD [facility] [phone] (phone) [phone] (fax)  [place]

## 2021-10-01 ENCOUNTER — Telehealth (INDEPENDENT_AMBULATORY_CARE_PROVIDER_SITE_OTHER): Payer: BC Managed Care – PPO | Admitting: Family Medicine

## 2021-10-01 DIAGNOSIS — R0981 Nasal congestion: Secondary | ICD-10-CM

## 2021-10-01 DIAGNOSIS — H9202 Otalgia, left ear: Secondary | ICD-10-CM | POA: Diagnosis not present

## 2021-10-01 MED ORDER — FLUTICASONE PROPIONATE 50 MCG/ACT NA SUSP: 2.0000 | Freq: Every day | NASAL | 0 refills | Status: AC

## 2021-10-01 MED ORDER — AMOXICILLIN 500 MG PO CAPS: 1000.0000 mg | ORAL_CAPSULE | Freq: Two times a day (BID) | ORAL | 0 refills | Status: AC

## 2021-10-19 ENCOUNTER — Encounter: Payer: Self-pay | Admitting: Family Medicine

## 2021-10-20 ENCOUNTER — Telehealth: Payer: Self-pay | Admitting: Family Medicine

## 2021-10-20 NOTE — Telephone Encounter (Signed)
Pt is calling upset stating that she did respond to the MyChart Message to come in for an appt tomorrow. Pt was advised that the appt was not scheduled. And that there is no time slots available at 9:20a or 11:20a. 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 Please advise  [phone]

## 2021-10-20 NOTE — Telephone Encounter (Signed)
Can you see if we could put her in the 920 or 11 am slots tomorrow?

## 2021-10-21 ENCOUNTER — Ambulatory Visit: Payer: BC Managed Care – PPO | Admitting: Family Medicine

## 2021-10-21 NOTE — Telephone Encounter (Signed)
Please see mychart note

## 2021-10-22 NOTE — Progress Notes (Signed)
Established patient visit   Patient: Virginia [name]   DOB: 13-Jun-1966   55 y.o. Female  MRN: [id number] Visit Date: 10/23/2021  Today's healthcare provider: Lavon Paganini, MD   Chief Complaint  Patient presents with   Anxiety    Subjective    HPI  Patient is a 55 year old female with c/o of anxiety. Patient reports that since she had breast cancer and the increase of general population of every time she turns around someone else has cancer. Patient complains of panic attacks.  She has the following symptoms: fatigue, crying . Onset of symptoms was approximately a few days ago, gradually worsening since that time. She denies current suicidal and homicidal ideation. Family history significant for alcoholism, anxiety, depression, heart disease, and substance abuse. Risk factors: positive family history in  father, mother, and sister(s) Previous treatment includes Lexapro. Feels that her depression is stable on the Lexapro.  She complains of the following side effects from the treatment: none.   Has had this since she was a child.  She has obsessions around something like cancer and she will develop some compulsions around checking door locks, etc.  She is unable to move on from the compulsion without doing it.  She is not suicidal but feels like she cannot live like this.  She doesn't want to burden her family with any of this.    Lexapro is not helping enough for the last 2.5 weeks. Has taken this for many years for an eating disorder. She previously took Paxil and it worked well.  GAD 7 : Generalized Anxiety Score 10/23/2021 09/30/2017  Nervous, Anxious, on Edge 3 1  Control/stop worrying 3 0  Worry too much - different things 3 0  Trouble relaxing 2 0  Restless 0 0  Easily annoyed or irritable 1 0  Afraid - awful might happen 3 1  Total GAD 7 Score 15 2  Anxiety Difficulty Not difficult at all Not difficult at all      Depression screen [facility] 2/9 10/23/2021  Decreased  Interest 2  Down, Depressed, Hopeless 3  PHQ - 2 Score 5  Altered sleeping 2  Tired, decreased energy 2  Change in appetite 2  Feeling bad or failure about yourself  0  Trouble concentrating 0  Moving slowly or fidgety/restless 0  Suicidal thoughts 0  PHQ-9 Score 11  Difficult doing work/chores Not difficult at all     Medications: Outpatient Medications Prior to Visit  Medication Sig   cetirizine (ZYRTEC) 10 MG tablet Take 10 mg by mouth daily.   Cholecalciferol (VITAMIN D3) 250 MCG (10000 UT) TABS Take by mouth.   fluticasone (FLONASE) 50 MCG/ACT nasal spray Place 2 sprays into both nostrils daily.   omeprazole (PRILOSEC) 40 MG capsule Take 1 capsule (40 mg total) by mouth daily.   [DISCONTINUED] escitalopram (LEXAPRO) 20 MG tablet TAKE 1 TABLET (20 MG TOTAL) BY MOUTH DAILY.   [DISCONTINUED] ibuprofen (ADVIL) 400 MG tablet Take 400 mg by mouth every 6 (six) hours as needed.   No facility-administered medications prior to visit.    Review of Systems per HPI     Objective    BP 109/70 (BP Location: Left Arm, Cuff Size: Normal)   Pulse 95   Temp 97.9 F (36.6 C) (Oral)   Resp 16   Ht 5\' 1"  (1.549 m)   Wt 143 lb 4.8 oz (65 kg)   LMP 06/24/2019 (Approximate)   BMI 27.08 kg/m  {  Show previous vital signs (optional):[id number]}  Physical Exam Vitals reviewed.  Constitutional:      General: She is not in acute distress.    Appearance: She is well-developed.  HENT:     Head: Normocephalic and atraumatic.  Eyes:     General: No scleral icterus.    Conjunctiva/sclera: Conjunctivae normal.  Cardiovascular:     Rate and Rhythm: Normal rate and regular rhythm.  Pulmonary:     Effort: Pulmonary effort is normal. No respiratory distress.  Skin:    General: Skin is warm and dry.     Findings: No rash.  Neurological:     Mental Status: She is alert and oriented to person, place, and time.  Psychiatric:        Mood and Affect: Mood is anxious.        Behavior: Behavior  normal.        Thought Content: Thought content does not include homicidal or suicidal ideation. Thought content does not include homicidal or suicidal plan.      No results found for any visits on 10/23/21.  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      I, Lavon Paganini, MD, have reviewed all documentation for this visit. The documentation on 10/23/21 for the exam, diagnosis, procedures, and orders are all accurate and complete.   Halle Davlin, Dionne Bucy, MD, MPH [facility]

## 2021-10-23 ENCOUNTER — Encounter: Payer: Self-pay | Admitting: Family Medicine

## 2021-10-23 ENCOUNTER — Other Ambulatory Visit: Payer: Self-pay

## 2021-10-23 ENCOUNTER — Ambulatory Visit (INDEPENDENT_AMBULATORY_CARE_PROVIDER_SITE_OTHER): Payer: BC Managed Care – PPO | Admitting: Family Medicine

## 2021-10-23 VITALS — BP 109/70 | HR 95 | Temp 97.9°F | Resp 16 | Ht 61.0 in | Wt 143.3 lb

## 2021-10-23 DIAGNOSIS — F4323 Adjustment disorder with mixed anxiety and depressed mood: Secondary | ICD-10-CM | POA: Insufficient documentation

## 2021-10-23 DIAGNOSIS — F422 Mixed obsessional thoughts and acts: Secondary | ICD-10-CM | POA: Diagnosis not present

## 2021-10-23 MED ORDER — FLUVOXAMINE MALEATE 100 MG PO TABS: 100.0000 mg | ORAL_TABLET | Freq: Every day | ORAL | 3 refills | Status: DC

## 2021-10-23 NOTE — Assessment & Plan Note (Signed)
Also with some depression related to her health - with breast cancer See plan below for switching SSRI consider resuming therapy

## 2021-10-23 NOTE — Assessment & Plan Note (Signed)
Longstanding problem, but new diagnosis Her anxiety seems to line up with OCD, rather than GAD Will stop Lexapro and start Luvox 100mg  daily F/u in 1 m and repeat PHQ9 and GAD 7 and consider dose titration

## 2021-12-01 ENCOUNTER — Telehealth (INDEPENDENT_AMBULATORY_CARE_PROVIDER_SITE_OTHER): Payer: BC Managed Care – PPO | Admitting: Family Medicine

## 2021-12-01 ENCOUNTER — Other Ambulatory Visit: Payer: Self-pay

## 2021-12-01 ENCOUNTER — Telehealth: Payer: BC Managed Care – PPO | Admitting: Family Medicine

## 2021-12-01 ENCOUNTER — Encounter: Payer: Self-pay | Admitting: Family Medicine

## 2021-12-01 DIAGNOSIS — F422 Mixed obsessional thoughts and acts: Secondary | ICD-10-CM

## 2021-12-01 DIAGNOSIS — F4323 Adjustment disorder with mixed anxiety and depressed mood: Secondary | ICD-10-CM

## 2021-12-01 MED ORDER — FLUVOXAMINE MALEATE 100 MG PO TABS: 100.0000 mg | ORAL_TABLET | Freq: Every day | ORAL | 1 refills | Status: DC

## 2021-12-01 NOTE — Progress Notes (Signed)
MyChart Video Visit    Virtual Visit via Video Note   This visit type was conducted due to national recommendations for restrictions regarding the COVID-19 Pandemic (e.g. social distancing) in an effort to limit this patient's exposure and mitigate transmission in our community. This patient is at least at moderate risk for complications without adequate follow up. This format is felt to be most appropriate for this patient at this time. Physical exam was limited by quality of the video and audio technology used for the visit.    Patient location: home Provider location: [place] involved in the visit: patient, provider  I discussed the limitations of evaluation and management by telemedicine and the availability of in person appointments. The patient expressed understanding and agreed to proceed.  Patient: Virginia [name]   DOB: 31-Mar-1966   56 y.o. Female  MRN: [id number] Visit Date: 12/01/2021  Today's healthcare provider: Lavon Paganini, MD   Chief Complaint  Patient presents with   Anxiety   Depression   Subjective    HPI  Follow up for MDD and GAD  The patient was last seen for this 1 months ago. Changes made at last visit include stop Lexapro and start Luvox 100mg  daily.consider dose titration. Medication caused nausea initially, but this resolved after moving it to nighttime.  She reports excellent compliance with treatment. She feels that condition is Improved. She is not having side effects.   Obsessive thoughts are much better.  Depression screen [facility] 2/9 12/01/2021 10/23/2021 05/05/2021  Decreased Interest 1 2 0  Down, Depressed, Hopeless 1 3 0  PHQ - 2 Score 2 5 0  Altered sleeping 0 2 0  Tired, decreased energy 0 2 0  Change in appetite 1 2 0  Feeling bad or failure about yourself  0 0 0  Trouble concentrating 0 0 0  Moving slowly or fidgety/restless 0 0 0  Suicidal thoughts 0 0 0  PHQ-9 Score 3 11 0  Difficult doing  work/chores Not difficult at all Not difficult at all Not difficult at all    GAD 7 : Generalized Anxiety Score 12/01/2021 10/23/2021 09/30/2017  Nervous, Anxious, on Edge 0 3 1  Control/stop worrying 1 3 0  Worry too much - different things 0 3 0  Trouble relaxing 0 2 0  Restless 1 0 0  Easily annoyed or irritable 0 1 0  Afraid - awful might happen 0 3 1  Total GAD 7 Score 2 15 2   Anxiety Difficulty Not difficult at all Not difficult at all Not difficult at all     -----------------------------------------------------------------------------------------    Medications: Outpatient Medications Prior to Visit  Medication Sig   cetirizine (ZYRTEC) 10 MG tablet Take 10 mg by mouth daily.   Cholecalciferol (VITAMIN D3) 250 MCG (10000 UT) TABS Take by mouth.   fluticasone (FLONASE) 50 MCG/ACT nasal spray Place 2 sprays into both nostrils daily.   omeprazole (PRILOSEC) 40 MG capsule Take 1 capsule (40 mg total) by mouth daily.   [DISCONTINUED] fluvoxaMINE (LUVOX) 100 MG tablet Take 1 tablet (100 mg total) by mouth daily.   No facility-administered medications prior to visit.    Review of Systems per HPI    Objective    LMP 06/24/2019 (Approximate)    Physical Exam Constitutional:      General: She is not in acute distress.    Appearance: Normal appearance.  HENT:     Head: Normocephalic.  Pulmonary:     Effort:  Pulmonary effort is normal. No respiratory distress.  Neurological:     Mental Status: She is alert and oriented to person, place, and time. Mental status is at baseline.       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     I discussed the assessment and treatment plan with the patient. The patient was provided an opportunity to ask questions and all were answered. The patient agreed with the plan and demonstrated an understanding of the instructions.   The patient was advised to call back or seek an in-person evaluation if the symptoms worsen or if the condition fails to improve as anticipated.  I, Lavon Paganini, MD, have reviewed all documentation for this visit. The documentation on 12/01/21 for the exam, diagnosis, procedures, and orders are all accurate and complete.   Mariajose Mow, Dionne Bucy, MD, MPH [facility]

## 2021-12-01 NOTE — Assessment & Plan Note (Signed)
Much improved on Luvox - will continue at current dose Encourage therapy

## 2021-12-01 NOTE — Assessment & Plan Note (Signed)
Much improved on Luvox - will continue at current dose Encourage therapy also

## 2022-05-06 NOTE — Progress Notes (Signed)
I,Sulibeya S Dimas,acting as a Education administrator for Lavon Paganini, MD.,have documented all relevant documentation on the behalf of Lavon Paganini, MD,as directed by  Lavon Paganini, MD while in the presence of Lavon Paganini, MD.   Complete physical exam   Patient: Virginia [name]   DOB: [birth year]   56 y.o. Female  MRN: [id number] Visit Date: 05/07/2022  Today's healthcare provider: Lavon Paganini, MD   Chief Complaint  Patient presents with   Annual Exam   Subjective    [name] is a 56 y.o. female who presents today for a complete physical exam.  She reports consuming a general diet. Home exercise routine includes walking 1 hrs per week. She generally feels well. She reports sleeping well. She does have additional problems to discuss today.  HPI    Past Medical History:  Diagnosis Date   Anorexia    Anxiety    Arthritis    knees   Cancer ([place])    R Breast Cancer 2021   Family history of breast cancer    Family history of lung cancer    Family history of pancreatic cancer    Fibroids    Wears glasses    Past Surgical History:  Procedure Laterality Date   BREAST RECONSTRUCTION WITH PLACEMENT OF TISSUE EXPANDER AND ALLODERM Bilateral 08/06/2020   Procedure: BILATERAL BREAST RECONSTRUCTION WITH PLACEMENT OF TISSUE EXPANDER AND ALLODERM;  Surgeon: Irene Limbo, MD;  Location: [place];  Service: Plastics;  Laterality: Bilateral;   BREAST RECONSTRUCTION WITH PLACEMENT OF TISSUE EXPANDER AND ALLODERM Right 11/26/2020   Procedure: BREAST RECONSTRUCTION WITH PLACEMENT OF TISSUE EXPANDER AND ALLODERM;  Surgeon: Irene Limbo, MD;  Location: [facility];  Service: Plastics;  Laterality: Right;   CLEFT PALATE REPAIR     CLEFT PALATE REPAIR  1970   COLONOSCOPY WITH PROPOFOL N/A 04/25/2018   Procedure: COLONOSCOPY WITH PROPOFOL;  Surgeon: [name], MD;  Location: [place];  Service: Endoscopy;  Laterality: N/A;    LIPOSUCTION WITH LIPOFILLING Bilateral 02/21/2021   Procedure: LIPOSUCTION FROM ABDOMEN AND BILATERAL FLANKS; LIPOFILLING TO BILATERAL CHEST;  Surgeon: Irene Limbo, MD;  Location: [place];  Service: Plastics;  Laterality: Bilateral;   NIPPLE SPARING MASTECTOMY WITH SENTINEL LYMPH NODE BIOPSY Bilateral 08/06/2020   Procedure: BILATERAL NIPPLE SPARING MASTECTOMIES WITH RIGHT AXILLARY  SENTINEL LYMPH NODE MAPPING;  Surgeon: Erroll Luna, MD;  Location: [place];  Service: General;  Laterality: Bilateral;  PECTORAL BLOCK   PORT-A-CATH REMOVAL Right 08/06/2020   Procedure: REMOVAL PORT-A-CATH;  Surgeon: Erroll Luna, MD;  Location: [place];  Service: General;  Laterality: Right;   PORTACATH PLACEMENT Right 02/22/2020   Procedure: INSERTION PORT-A-CATH WITH ULTRASOUND GUIDANCE;  Surgeon: Erroll Luna, MD;  Location: [facility];  Service: General;  Laterality: Right;   REMOVAL OF BILATERAL TISSUE EXPANDERS WITH PLACEMENT OF BILATERAL BREAST IMPLANTS Bilateral 02/21/2021   Procedure: REMOVAL OF BILATERAL TISSUE EXPANDERS WITH PLACEMENT OF BILATERAL BREAST SILICONE IMPLANTS;  Surgeon: Irene Limbo, MD;  Location: [place];  Service: Plastics;  Laterality: Bilateral;   REMOVAL OF TISSUE EXPANDER AND PLACEMENT OF IMPLANT Bilateral 08/25/2020   Procedure: REMOVAL OF RIGHT BREAST TISSUE EXPANDER; DEBRIDEMENT BILATERAL MASTECTOMY FLAP NIPPLES;  Surgeon: Irene Limbo, MD;  Location: [place];  Service: Plastics;  Laterality: Bilateral;   Social History   Socioeconomic History   Marital status: Married    Spouse name: Luretha Rued   Number of children: 2  Years of education: 21   Highest education level: Bachelor's degree (e.g., BA, AB, BS)  Occupational History   Occupation: works on [employer]  Tobacco Use   Smoking status: Never   Smokeless tobacco: Never  Vaping Use   Vaping Use: Never used  Substance and  Sexual Activity   Alcohol use: No    Alcohol/week: 0.0 standard drinks of alcohol   Drug use: No   Sexual activity: Yes    Partners: Male    Birth control/protection: Surgical    Comment: husband s/p vasectomy  Other Topics Concern   Not on file  Social History Narrative   Lives with husband   Social Determinants of Health   Financial Resource Strain: Low Risk  (03/30/2018)   Overall Financial Resource Strain (CARDIA)    Difficulty of Paying Living Expenses: Not hard at all  Food Insecurity: No Food Insecurity (03/30/2018)   Hunger Vital Sign    Worried About Running Out of Food in the Last Year: Never true    Ran Out of Food in the Last Year: Never true  Transportation Needs: No Transportation Needs (03/30/2018)   PRAPARE - Hydrologist (Medical): No    Lack of Transportation (Non-Medical): No  Physical Activity: Not on file  Stress: No Stress Concern Present (03/30/2018)   [place]    Feeling of Stress : Not at all  Social Connections: Not on file  Intimate Partner Violence: Not on file   Family Status  Relation Name Status   Mother  Alive   Father  Alive   Daughter  Alive   MGM  Deceased   Field seismologist  (Not Specified)   Annamarie Major  Deceased   Sister  Alive   Sister  Alive   Mat Uncle x2 Deceased   Son  [place] x2 Alive   MGF  Deceased   PGM  Deceased   PGF  Deceased   Cousin pat Alive   Pat Aunt  Deceased   Neg Hx  (Not Specified)   Family History  Problem Relation Age of Onset   Anxiety disorder Mother    Heart disease Father 81       CABG x4   Diabetes Father    Hypertension Father    Hypothyroidism Father    Anxiety disorder Daughter    Heart disease Maternal Grandmother    Stroke Maternal Grandmother 68   Heart disease Paternal Aunt    Stroke Paternal Aunt    Heart disease Paternal Uncle    Pancreatic cancer Paternal Uncle        dx 81s    Hypertension Sister    Hyperlipidemia Sister    Hypothyroidism Sister    Lung cancer Maternal Uncle    Healthy Son    Breast cancer Cousin    Lung cancer Paternal Aunt    Colon cancer Neg Hx    Ovarian cancer Neg Hx    Cervical cancer Neg Hx    No Known Allergies  Patient Care Team: Gurtej Noyola, Dionne Bucy, MD as PCP - General (Family Medicine) Erroll Luna, MD as Consulting Physician (General Surgery) Nicholas Lose, MD as Consulting Physician (Hematology and Oncology) Irene Limbo, MD as Consulting Physician (Plastic Surgery)   Medications: Outpatient Medications Prior to Visit  Medication Sig   cetirizine (ZYRTEC) 10 MG tablet Take 10 mg by mouth daily.   Cholecalciferol (VITAMIN D3) 250  MCG (10000 UT) TABS Take by mouth.   fluticasone (FLONASE) 50 MCG/ACT nasal spray Place 2 sprays into both nostrils daily.   fluvoxaMINE (LUVOX) 100 MG tablet Take 1 tablet (100 mg total) by mouth daily.   omeprazole (PRILOSEC) 40 MG capsule Take 1 capsule (40 mg total) by mouth daily.   No facility-administered medications prior to visit.    Review of Systems  HENT:  Positive for sinus pressure.   Musculoskeletal:  Positive for myalgias.  All other systems reviewed and are negative.   Last CBC Lab Results  Component Value Date   WBC 5.6 05/13/2021   HGB 12.3 05/13/2021   HCT 36.6 05/13/2021   MCV 87 05/13/2021   MCH 29.1 05/13/2021   RDW 13.1 05/13/2021   PLT 193 85/46/2703   Last metabolic panel Lab Results  Component Value Date   GLUCOSE 87 05/13/2021   NA 141 05/13/2021   K 4.4 05/13/2021   CL 102 05/13/2021   CO2 25 05/13/2021   BUN 15 05/13/2021   CREATININE 0.71 05/13/2021   EGFR 102 05/13/2021   CALCIUM 9.4 05/13/2021   PROT 6.7 05/13/2021   ALBUMIN 4.7 05/13/2021   LABGLOB 2.0 05/13/2021   AGRATIO 2.4 (H) 05/13/2021   BILITOT 0.3 05/13/2021   ALKPHOS 74 05/13/2021   AST 18 05/13/2021   ALT 16 05/13/2021   ANIONGAP 9 08/02/2020   Last lipids Lab  Results  Component Value Date   CHOL 186 05/13/2021   HDL 60 05/13/2021   LDLCALC 109 (H) 05/13/2021   TRIG 91 05/13/2021   CHOLHDL 2.7 04/20/2019   Last hemoglobin A1c Lab Results  Component Value Date   HGBA1C 5.1 06/09/2017   Last thyroid functions Lab Results  Component Value Date   TSH 1.670 04/20/2019   Last vitamin D Lab Results  Component Value Date   VD25OH 25.8 (L) 05/13/2021       Objective     BP 113/79 (BP Location: Left Arm, Patient Position: Sitting, Cuff Size: Large)   Pulse 80   Temp 98.4 F (36.9 C) (Oral)   Resp 16   Ht _0  (1.549 m)   Wt 142 lb (64.4 kg)   LMP 06/21/2019   BMI 26.83 kg/m  BP Readings from Last 3 Encounters:  05/07/22 113/79  10/23/21 109/70  07/22/21 126/81   Wt Readings from Last 3 Encounters:  05/07/22 142 lb (64.4 kg)  10/23/21 143 lb 4.8 oz (65 kg)  07/22/21 144 lb 1.6 oz (65.4 kg)     Physical Exam Vitals reviewed.  Constitutional:      General: She is not in acute distress.    Appearance: Normal appearance. She is well-developed. She is not diaphoretic.  HENT:     Head: Normocephalic and atraumatic.     Right Ear: Tympanic membrane, ear canal and external ear normal.     Left Ear: Tympanic membrane, ear canal and external ear normal.     Nose: Nose normal.     Mouth/Throat:     Mouth: Mucous membranes are moist.     Pharynx: Oropharynx is clear. No oropharyngeal exudate.  Eyes:     General: No scleral icterus.    Conjunctiva/sclera: Conjunctivae normal.     Pupils: Pupils are equal, round, and reactive to light.  Neck:     Thyroid: No thyromegaly.  Cardiovascular:     Rate and Rhythm: Normal rate and regular rhythm.     Pulses: Normal pulses.     Heart  sounds: Normal heart sounds. No murmur heard. Pulmonary:     Effort: Pulmonary effort is normal. No respiratory distress.     Breath sounds: Normal breath sounds. No wheezing or rales.  Abdominal:     General: There is no distension.      Palpations: Abdomen is soft.     Tenderness: There is no abdominal tenderness.  Musculoskeletal:        General: No deformity.     Cervical back: Neck supple.     Right lower leg: No edema.     Left lower leg: No edema.  Lymphadenopathy:     Cervical: No cervical adenopathy.  Skin:    General: Skin is warm and dry.     Findings: No rash.  Neurological:     Mental Status: She is alert and oriented to person, place, and time. Mental status is at baseline.     Gait: Gait normal.  Psychiatric:        Mood and Affect: Mood normal.        Behavior: Behavior normal.        Thought Content: Thought content normal.       Last depression screening scores    05/07/2022    9:07 AM 12/01/2021    2:56 PM 10/23/2021    3:23 PM  PHQ 2/9 Scores  PHQ - 2 Score _0 PHQ- 9 Score _1 Last fall risk screening    05/07/2022    9:07 AM  [place] in the past year? 0  Number falls in past yr: 0  Injury with Fall? 0  Risk for fall due to : No Fall Risks  Follow up Falls evaluation completed   Last Audit-C alcohol use screening    05/07/2022    9:08 AM  Alcohol Use Disorder Test (AUDIT)  1. How often do you have a drink containing alcohol? 0  2. How many drinks containing alcohol do you have on a typical day when you are drinking? 0  3. How often do you have six or more drinks on one occasion? 0  AUDIT-C Score 0   A score of 3 or more in women, and 4 or more in men indicates increased risk for alcohol abuse, EXCEPT if all of the points are from question 1   No results found for any visits on 05/07/22.    Assessment & Plan    Routine Health Maintenance and Physical Exam  Exercise Activities and Dietary recommendations  Goals   None     Immunization History  Administered Date(s) Administered   Influenza,inj,Quad PF,6+ Mos 08/17/2019   Influenza-Unspecified 08/08/2015, 09/13/2020, 10/16/2021   PFIZER(Purple Top)SARS-COV-2 Vaccination 11/15/2019, 12/05/2019,  10/18/2020   PNEUMOCOCCAL CONJUGATE-20 05/05/2021   Td 11/06/2010, 01/20/2021   Tdap 11/06/2010   Zoster Recombinat (Shingrix) 09/10/2019, 05/05/2021    Health Maintenance  Topic Date Due   COVID-19 Vaccine (4 - Booster for Pfizer series) 12/13/2020   INFLUENZA VACCINE  06/16/2022   PAP SMEAR-Modifier  01/23/2025   COLONOSCOPY (Pts 45-26yr Insurance coverage will need to be confirmed)  04/25/2028   TETANUS/TDAP  01/21/2031   Hepatitis C Screening  Completed   HIV Screening  Completed   Zoster Vaccines- Shingrix  Completed   HPV VACCINES  Aged Out    Discussed health benefits of physical activity, and encouraged her to engage in regular exercise appropriate for her age and condition.  Problem List Items Addressed This Visit  Other   Disordered eating    Very well controlled for her Will continue luvox at current dose      Avitaminosis D   Relevant Orders   VITAMIN D 25 Hydroxy (Vit-D Deficiency, Fractures)   Family history of thyroid disease   Relevant Orders   TSH   Malignant neoplasm of upper-outer quadrant of right breast in female, estrogen receptor negative ([place])    F/b oncology      Relevant Orders   CBC   Overweight    Discussed importance of healthy weight management Discussed diet and exercise       Relevant Orders   Comprehensive metabolic panel   Lipid Panel With LDL/HDL Ratio   Mixed obsessional thoughts and acts    Well controlled on Luvox Will continue at current dose      Other Visit Diagnoses     Encounter for annual health examination    -  Primary   Relevant Orders   Comprehensive metabolic panel   Lipid Panel With LDL/HDL Ratio   VITAMIN D 25 Hydroxy (Vit-D Deficiency, Fractures)   TSH   CBC        Return in about 1 year (around 05/08/2023) for CPE.     I, Lavon Paganini, MD, have reviewed all documentation for this visit. The documentation on 05/07/22 for the exam, diagnosis, procedures, and orders are all accurate  and complete.   Myna Freimark, Dionne Bucy, MD, MPH [facility]

## 2022-05-07 ENCOUNTER — Encounter: Payer: Self-pay | Admitting: Family Medicine

## 2022-05-07 ENCOUNTER — Ambulatory Visit (INDEPENDENT_AMBULATORY_CARE_PROVIDER_SITE_OTHER): Payer: BC Managed Care – PPO | Admitting: Family Medicine

## 2022-05-07 VITALS — BP 113/79 | HR 80 | Temp 98.4°F | Resp 16 | Ht 61.0 in | Wt 142.0 lb

## 2022-05-07 DIAGNOSIS — Z8349 Family history of other endocrine, nutritional and metabolic diseases: Secondary | ICD-10-CM

## 2022-05-07 DIAGNOSIS — E559 Vitamin D deficiency, unspecified: Secondary | ICD-10-CM | POA: Diagnosis not present

## 2022-05-07 DIAGNOSIS — Z0000 Encounter for general adult medical examination without abnormal findings: Secondary | ICD-10-CM | POA: Diagnosis not present

## 2022-05-07 DIAGNOSIS — C50411 Malignant neoplasm of upper-outer quadrant of right female breast: Secondary | ICD-10-CM

## 2022-05-07 DIAGNOSIS — E663 Overweight: Secondary | ICD-10-CM | POA: Diagnosis not present

## 2022-05-07 DIAGNOSIS — Z171 Estrogen receptor negative status [ER-]: Secondary | ICD-10-CM

## 2022-05-07 DIAGNOSIS — F509 Eating disorder, unspecified: Secondary | ICD-10-CM

## 2022-05-07 DIAGNOSIS — F422 Mixed obsessional thoughts and acts: Secondary | ICD-10-CM

## 2022-05-07 NOTE — Assessment & Plan Note (Signed)
Discussed importance of healthy weight management Discussed diet and exercise  

## 2022-05-07 NOTE — Assessment & Plan Note (Signed)
Well controlled on Luvox Will continue at current dose

## 2022-05-07 NOTE — Assessment & Plan Note (Signed)
Very well controlled for her Will continue luvox at current dose

## 2022-05-07 NOTE — Assessment & Plan Note (Signed)
F/b oncology °

## 2022-05-08 ENCOUNTER — Encounter: Payer: Self-pay | Admitting: Hematology and Oncology

## 2022-05-08 LAB — COMPREHENSIVE METABOLIC PANEL
ALT: 11 IU/L (ref 0–32)
AST: 13 IU/L (ref 0–40)
Albumin/Globulin Ratio: 2 (ref 1.2–2.2)
Albumin: 4.6 g/dL (ref 3.8–4.9)
Alkaline Phosphatase: 79 IU/L (ref 44–121)
BUN/Creatinine Ratio: 27 — ABNORMAL HIGH (ref 9–23)
BUN: 18 mg/dL (ref 6–24)
Bilirubin Total: 0.3 mg/dL (ref 0.0–1.2)
CO2: 23 mmol/L (ref 20–29)
Calcium: 9.7 mg/dL (ref 8.7–10.2)
Chloride: 101 mmol/L (ref 96–106)
Creatinine, Ser: 0.67 mg/dL (ref 0.57–1.00)
Globulin, Total: 2.3 g/dL (ref 1.5–4.5)
Glucose: 93 mg/dL (ref 70–99)
Potassium: 4.5 mmol/L (ref 3.5–5.2)
Sodium: 139 mmol/L (ref 134–144)
Total Protein: 6.9 g/dL (ref 6.0–8.5)
eGFR: 104 mL/min/{1.73_m2} (ref >59)

## 2022-05-08 LAB — CBC
Hematocrit: 40.9 % (ref 34.0–46.6)
Hemoglobin: 13.9 g/dL (ref 11.1–15.9)
MCH: 29.8 pg (ref 26.6–33.0)
MCHC: 34 g/dL (ref 31.5–35.7)
MCV: 88 fL (ref 79–97)
Platelets: 184 10*3/uL (ref 150–450)
RBC: 4.67 x10E6/uL (ref 3.77–5.28)
RDW: 12.4 % (ref 11.7–15.4)
WBC: 6.2 10*3/uL (ref 3.4–10.8)

## 2022-05-08 LAB — LIPID PANEL WITH LDL/HDL RATIO
Cholesterol, Total: 176 mg/dL (ref 100–199)
HDL: 62 mg/dL (ref >39)
LDL Chol Calc (NIH): 96 mg/dL (ref 0–99)
LDL/HDL Ratio: 1.5 ratio (ref 0.0–3.2)
Triglycerides: 102 mg/dL (ref 0–149)
VLDL Cholesterol Cal: 18 mg/dL (ref 5–40)

## 2022-05-08 LAB — TSH: TSH: 1.08 u[IU]/mL (ref 0.450–4.500)

## 2022-05-08 LAB — VITAMIN D 25 HYDROXY (VIT D DEFICIENCY, FRACTURES): Vit D, 25-Hydroxy: 33.4 ng/mL (ref 30.0–100.0)

## 2022-05-15 NOTE — Progress Notes (Signed)
Patient Care Team: Virginia Crews, MD as PCP - General (Family Medicine) Erroll Luna, MD as Consulting Physician (General Surgery) Nicholas Lose, MD as Consulting Physician (Hematology and Oncology) Irene Limbo, MD as Consulting Physician (Plastic Surgery)  DIAGNOSIS:  Encounter Diagnosis  Name Primary?   Malignant neoplasm of upper-outer quadrant of right breast in female, estrogen receptor negative ([name])     SUMMARY OF ONCOLOGIC HISTORY: Oncology History  Malignant neoplasm of upper-outer quadrant of right breast in female, estrogen receptor negative ([name])  02/02/2020 Initial Diagnosis   Right breast lump tenderness. Diagnostic mammogram and [place] on 02/01/20 showed a 2.0cm mass at the 10 o'clock position with no axillary adenopathy. 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, ER/PR negative, Ki67 80%   02/08/2020 Cancer Staging   Staging form: Breast, AJCC 8th Edition - Clinical stage from 02/08/2020: Stage IB (cT1c, cN0, cM0, G3, ER-, PR-, HER2-) - Signed by Nicholas Lose, MD on 02/08/2020   02/23/2020 -  Chemotherapy   The patient had dexamethasone (DECADRON) 4 MG tablet, 1 of 1 cycle, Start date: 02/08/2020, End date: 04/22/2020 DOXOrubicin (ADRIAMYCIN) chemo injection 98 mg, 60 mg/m2 = 98 mg, Intravenous,  Once, 4 of 4 cycles Administration: 98 mg (02/23/2020), 98 mg (03/08/2020), 98 mg (03/22/2020), 98 mg (04/05/2020) palonosetron (ALOXI) injection 0.25 mg, 0.25 mg, Intravenous,  Once, 7 of 8 cycles Administration: 0.25 mg (02/23/2020), 0.25 mg (04/22/2020), 0.25 mg (03/08/2020), 0.25 mg (03/22/2020), 0.25 mg (04/05/2020), 0.25 mg (05/17/2020), 0.25 mg (06/07/2020) pegfilgrastim (NEULASTA ONPRO KIT) injection 6 mg, 6 mg, Subcutaneous, Once, 4 of 4 cycles Administration: 6 mg (02/23/2020), 6 mg (03/08/2020), 6 mg (03/22/2020), 6 mg (04/05/2020) CARBOplatin (PARAPLATIN) 630 mg in sodium chloride 0.9 % 250 mL chemo infusion, 700 mg (100 % of original dose 700 mg),  Intravenous,  Once, 3 of 3 cycles Dose modification: 700 mg (original dose 700 mg, Cycle 5), 637.2 mg (original dose 700 mg, Cycle 6, Reason: Change in SCr/CrCl),   (original dose 700 mg, Cycle 6, Reason: Dose not tolerated) Administration: 630 mg (04/22/2020), 530 mg (05/17/2020), 530 mg (06/07/2020) cyclophosphamide (CYTOXAN) 980 mg in sodium chloride 0.9 % 250 mL chemo infusion, 600 mg/m2 = 980 mg, Intravenous,  Once, 4 of 4 cycles Administration: 980 mg (02/23/2020), 980 mg (03/08/2020), 980 mg (03/22/2020), 980 mg (04/05/2020) PACLitaxel (TAXOL) 132 mg in sodium chloride 0.9 % 250 mL chemo infusion (</= 61m/m2), 80 mg/m2 = 132 mg, Intravenous,  Once, 3 of 4 cycles Dose modification: 60 mg/m2 (original dose 80 mg/m2, Cycle 5, Reason: Dose not tolerated), 45 mg/m2 (original dose 80 mg/m2, Cycle 7, Reason: Provider Judgment) Administration: 132 mg (04/22/2020), 132 mg (05/03/2020), 96 mg (05/09/2020), 96 mg (05/17/2020), 96 mg (05/24/2020), 96 mg (05/31/2020), 96 mg (06/07/2020), 72 mg (06/14/2020) fosaprepitant (EMEND) 150 mg in sodium chloride 0.9 % 145 mL IVPB, 150 mg, Intravenous,  Once, 7 of 8 cycles Administration: 150 mg (02/23/2020), 150 mg (04/22/2020), 150 mg (03/08/2020), 150 mg (03/22/2020), 150 mg (04/05/2020), 150 mg (05/17/2020), 150 mg (06/07/2020)  for chemotherapy treatment.     Genetic Testing   Negative genetic testing. No pathogenic variants identified on the [facility] Common Hereditary Cancers Panel. The report date is 02/25/2020.   The Common Hereditary Cancers Panel offered by [facility] includes sequencing and/or deletion duplication testing of the following 48 genes: APC, ATM, AXIN2, BARD1, BMPR1A, BRCA1, BRCA2, BRIP1, CDH1, CDKN2A (p14ARF), CDKN2A (p16INK4a), CKD4, CHEK2, CTNNA1, DICER1, EPCAM (Deletion/duplication testing only), GREM1 (promoter region deletion/duplication testing only), KIT, MEN1,  MLH1, MSH2, MSH3, MSH6, MUTYH, NBN, NF1, NHTL1, PALB2, PDGFRA, PMS2, POLD1, POLE, PTEN, RAD50, RAD51C, RAD51D,  RNF43, SDHB, SDHC, SDHD, SMAD4, SMARCA4. STK11, TP53, TSC1, TSC2, and VHL.  The following genes were evaluated for sequence changes only: SDHA and HOXB13 c.251G>A variant only.   08/06/2020 Surgery   Bilateral mastectomies ([place]): no residual invasive carcinoma in the right breast and no evidence of malignancy on the left breast, with 2 right axillary lymph nodes negative for carcinoma.   08/06/2020 Cancer Staging   Staging form: Breast, AJCC 8th Edition - Pathologic stage from 08/06/2020: No Stage Recommended (ypT0, pN0, cM0) - Signed by Gardenia Phlegm, NP on 11/22/2020     CHIEF COMPLIANT: Complains of a palpable spot in the right breast outer aspect  INTERVAL HISTORY: Virginia [name] is a 56 y.o. with above-mentioned history of  breast cancer who completed neoadjuvant chemotherapy and underwent bilateral mastectomies. She presents to the clinic today for follow-up.     ALLERGIES:  has No Known Allergies.  MEDICATIONS:  Current Outpatient Medications  Medication Sig Dispense Refill   cetirizine (ZYRTEC) 10 MG tablet Take 10 mg by mouth daily.     Cholecalciferol (VITAMIN D3) 250 MCG (10000 UT) TABS Take by mouth.     fluticasone (FLONASE) 50 MCG/ACT nasal spray Place 2 sprays into both nostrils daily. 16 g 0   fluvoxaMINE (LUVOX) 100 MG tablet Take 1 tablet (100 mg total) by mouth daily. 90 tablet 1   omeprazole (PRILOSEC) 40 MG capsule Take 1 capsule (40 mg total) by mouth daily. 30 capsule 3   No current facility-administered medications for this visit.    PHYSICAL EXAMINATION: ECOG PERFORMANCE STATUS: 1 - Symptomatic but completely ambulatory  Vitals:   05/29/22 0946  BP: (!) 123/92  Pulse: 80  Resp: 18  Temp: 98.9 F (37.2 C)  SpO2: 100%   Filed Weights   05/29/22 0946  Weight: 144 lb (65.3 kg)    BREAST: No palpable masses or nodules in either right or left reconstructed breasts. No palpable axillary supraclavicular or infraclavicular adenopathy  .  (exam performed in the presence of a chaperone)  LABORATORY DATA:  I have reviewed the data as listed    Latest Ref Rng & Units 05/07/2022    9:45 AM 05/13/2021    8:03 AM 08/02/2020    9:47 AM  CMP  Glucose 70 - 99 mg/dL 93  87  73   BUN 6 - 24 mg/dL '18  15  16   ' Creatinine 0.57 - 1.00 mg/dL 0.67  0.71  0.72   Sodium 134 - 144 mmol/L 139  141  138   Potassium 3.5 - 5.2 mmol/L 4.5  4.4  4.5   Chloride 96 - 106 mmol/L 101  102  105   CO2 20 - 29 mmol/L '23  25  24   ' Calcium 8.7 - 10.2 mg/dL 9.7  9.4  9.3   Total Protein 6.0 - 8.5 g/dL 6.9  6.7  6.5   Total Bilirubin 0.0 - 1.2 mg/dL 0.3  0.3  0.6   Alkaline Phos 44 - 121 IU/L 79  74  64   AST 0 - 40 IU/L '13  18  30   ' ALT 0 - 32 IU/L 11  16  40     Lab Results  Component Value Date   WBC 6.2 05/07/2022   HGB 13.9 05/07/2022   HCT 40.9 05/07/2022   MCV 88 05/07/2022   PLT 184 05/07/2022  NEUTROABS 2.8 05/13/2021    ASSESSMENT & PLAN:  Malignant neoplasm of upper-outer quadrant of right breast in female, estrogen receptor negative ([place]) 02/02/2020:Right breast lump tenderness. Diagnostic mammogram and [place] on 02/01/20 showed a 2.0cm mass at the 10 o'clock position with no axillary adenopathy. 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 FISH negative, ER/PR negative, Ki67 80% T1CN0 stage Ib   Treatment: 1. Neoadjuvant chemotherapy with dose dense Adriamycin and Cytoxan x4 followed by Taxol and carboplatin x7 discontinued 06/14/2020 2. 08/06/2020: Bilateral mastectomies with reconstruction right mastectomy: No residual invasive cancer, 0/2 sentinel lymph nodes negative: Pathologic complete response Left mastectomy: Benign (breast reconstruction completed April 2022)   Breast cancer surveillance: 1.  Breast exam 05/29/2022: Benign (bilateral mastectomies with reconstruction) 2. no role of imaging studies since she had bilateral mastectomies with reconstruction   Chemo-induced peripheral neuropathy: I discussed  with her about participating in the neuropathy clinical trial.  She rates it as 4-5 out of 10.  For surveillance we discussed about obtaining Signatera testing and she is agreeable. Return to clinic in 1 year for follow-up    No orders of the defined types were placed in this encounter.  The patient has a good understanding of the overall plan. she agrees with it. she will call with any problems that may develop before the next visit here. Total time spent: 30 mins including face to face time and time spent for planning, charting and co-ordination of care   Harriette Ohara, MD 05/29/22    I Gardiner Coins am scribing for Dr. Lindi Adie  I have reviewed the above documentation for accuracy and completeness, and I agree with the above.

## 2022-05-21 ENCOUNTER — Ambulatory Visit: Payer: 59 | Admitting: Hematology and Oncology

## 2022-05-29 ENCOUNTER — Encounter: Payer: Self-pay | Admitting: Radiology

## 2022-05-29 ENCOUNTER — Inpatient Hospital Stay: Payer: BC Managed Care – PPO | Attending: Hematology and Oncology | Admitting: Hematology and Oncology

## 2022-05-29 ENCOUNTER — Encounter: Payer: Self-pay | Admitting: *Deleted

## 2022-05-29 ENCOUNTER — Other Ambulatory Visit: Payer: Self-pay

## 2022-05-29 DIAGNOSIS — Z171 Estrogen receptor negative status [ER-]: Secondary | ICD-10-CM | POA: Diagnosis not present

## 2022-05-29 DIAGNOSIS — Z9013 Acquired absence of bilateral breasts and nipples: Secondary | ICD-10-CM | POA: Insufficient documentation

## 2022-05-29 DIAGNOSIS — Z79899 Other long term (current) drug therapy: Secondary | ICD-10-CM | POA: Insufficient documentation

## 2022-05-29 DIAGNOSIS — T451X5A Adverse effect of antineoplastic and immunosuppressive drugs, initial encounter: Secondary | ICD-10-CM | POA: Insufficient documentation

## 2022-05-29 DIAGNOSIS — C50411 Malignant neoplasm of upper-outer quadrant of right female breast: Secondary | ICD-10-CM | POA: Insufficient documentation

## 2022-05-29 DIAGNOSIS — G620 Drug-induced polyneuropathy: Secondary | ICD-10-CM | POA: Insufficient documentation

## 2022-05-29 NOTE — Progress Notes (Signed)
Per MD request RN successfully faxed Signatera testing [phone]).

## 2022-05-29 NOTE — Assessment & Plan Note (Addendum)
02/02/2020:Right breast lump tenderness. Diagnostic mammogram and [place] on 02/01/20 showed a 2.0cm mass at the 10 o'clock position with no axillary adenopathy. 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FISH negative, ER/PR negative, Ki67 80% T1CN0 stage Ib  Treatment: 1.Neoadjuvant chemotherapywith dose dense Adriamycin and Cytoxan x4 followed by Taxol and carboplatinx7 discontinued 06/14/2020 2.08/06/2020:Bilateral mastectomies with reconstruction right mastectomy: No residual invasive cancer, 0/2 sentinel lymph nodes negative: Pathologic complete response Left mastectomy: Benign(breast reconstruction completed April 2022)  Breast cancer surveillance: 1.Breast exam 05/29/2022: Benign (bilateral mastectomies with reconstruction) 2.no role of imaging studies since she had bilateral mastectomies with reconstruction  Chemo-induced peripheral neuropathy: I discussed with her about participating in the neuropathy clinical trial.  She rates it as 4-5 out of 10.  For surveillance we discussed about obtaining Signatera testing and she is agreeable. Return to clinic in 1 year for follow-up

## 2022-05-29 NOTE — Research (Signed)
[id number] - TREATMENT OF ESTABLISHED CHEMOTHERAPY-INDUCED NEUROPATHY WITH N-PALMITOYLETHANOLAMIDE, A CANNABIMIMETIC NUTRACEUTICAL: A RANDOMIZED DOUBLE-BLIND PHASE II PILOT TRIAL   05/29/2022  INTRO/ELIGIBILITY: Patient Virginia [name] was identified by Dr. Lindi Adie as a potential candidate for the above listed study.  This Clinical Research Coordinator met with [name], [id number], on 05/29/22 in a manner and location that ensures patient privacy to discuss participation in the above listed research study.  Patient is Unaccompanied.  A copy of the informed consent document with embedded HIPAA language was provided to the patient.  Patient reads, speaks, and understands [place].   Patient was provided with the business card of this Coordinator and encouraged to contact the research team with any questions.  Approximately 15 minutes were spent with the patient reviewing the informed consent documents.  Patient was provided the option of taking informed consent documents home to review and was encouraged to review at their convenience with their support network, including other care providers. Patient took the consent documents home to review. Patient was thanked for her time and consideration of the above mentioned study.  This coordinator verified neuropathy information with patient. After review in person and in chart, patient has a history of potential carpal tunnel. Patient is not eligible for the above mentioned study. Will call patient and let her know.   Carol Ada, RT(R)(T) Clinical Research Coordinator  PHONE CALL: LVM to inform patient she would not be eligible for the above mentioned study.

## 2022-06-04 ENCOUNTER — Telehealth: Payer: BC Managed Care – PPO | Admitting: Physician Assistant

## 2022-06-04 DIAGNOSIS — M5442 Lumbago with sciatica, left side: Secondary | ICD-10-CM

## 2022-06-04 MED ORDER — NAPROXEN 500 MG PO TABS: 500.0000 mg | ORAL_TABLET | Freq: Two times a day (BID) | ORAL | 0 refills | Status: DC

## 2022-06-04 MED ORDER — CYCLOBENZAPRINE HCL 10 MG PO TABS: 5.0000 mg | ORAL_TABLET | Freq: Three times a day (TID) | ORAL | 0 refills | Status: DC | PRN

## 2022-06-04 NOTE — Progress Notes (Signed)

## 2022-06-08 ENCOUNTER — Other Ambulatory Visit: Payer: Self-pay

## 2022-06-11 ENCOUNTER — Encounter: Payer: Self-pay | Admitting: Hematology and Oncology

## 2022-06-29 ENCOUNTER — Encounter: Payer: Self-pay | Admitting: Family Medicine

## 2022-06-29 ENCOUNTER — Encounter: Payer: Self-pay | Admitting: Hematology and Oncology

## 2022-07-02 ENCOUNTER — Other Ambulatory Visit: Payer: Self-pay | Admitting: Family Medicine

## 2022-07-02 NOTE — Progress Notes (Signed)
MyChart Video Visit    Virtual Visit via Video Note   This format is felt to be most appropriate for this patient at this time. Physical exam was limited by quality of the video and audio technology used for the visit.   Patient location: home Provider location: [facility]  I discussed the limitations of evaluation and management by telemedicine and the availability of in person appointments. The patient expressed understanding and agreed to proceed.  Patient: Virginia [name]   DOB: 1966-09-24   56 y.o. Female  MRN: [id number] Visit Date: 07/03/2022  Today's healthcare provider: [name], MD   No chief complaint on file.  Subjective    HPI  Depression and mixed obsessional thoughts and acts, Follow-up  She  was last seen for this on 05/07/2022 and was advised to continue same dose of fluvoxamine. She had been taking 20 mg escitalopram for anxiety for may years, until this was changed to '100mg'$  fluvoxamine in April due to worsening OCD symptoms, which are doing much better.    She reports excellent compliance with treatment.  Current symptoms include: depressed mood and panic attacks She feels she is Worse since last visit. Patient reports that in the last 3 weeks she has had several severe anxiety attacks, and a lack of interest in anything. She does report history of panic attacks when she was younger, but hadn't had them while taking '20mg'$  escitalopram.      05/07/2022    9:07 AM 12/01/2021    2:56 PM 10/23/2021    3:23 PM  Depression screen PHQ 2/9  Decreased Interest '1 1 2  '$ Down, Depressed, Hopeless '1 1 3  '$ PHQ - 2 Score '2 2 5  '$ Altered sleeping 0 0 2  Tired, decreased energy 1 0 2  Change in appetite '1 1 2  '$ Feeling bad or failure about yourself  0 0 0  Trouble concentrating 0 0 0  Moving slowly or fidgety/restless 0 0 0  Suicidal thoughts 0 0 0  PHQ-9 Score '4 3 11  '$ Difficult doing work/chores Not difficult at all Not difficult at all Not difficult at all     -----------------------------------------------------------------------------------------    Medications: Outpatient Medications Prior to Visit  Medication Sig   cetirizine (ZYRTEC) 10 MG tablet Take 10 mg by mouth daily.   Cholecalciferol (VITAMIN D3) 250 MCG (10000 UT) TABS Take by mouth.   cyclobenzaprine (FLEXERIL) 10 MG tablet Take 0.5-1 tablets (5-10 mg total) by mouth 3 (three) times daily as needed for muscle spasms.   fluticasone (FLONASE) 50 MCG/ACT nasal spray Place 2 sprays into both nostrils daily.   fluvoxaMINE (LUVOX) 100 MG tablet Take 1 tablet (100 mg total) by mouth daily.   naproxen (NAPROSYN) 500 MG tablet Take 1 tablet (500 mg total) by mouth 2 (two) times daily with a meal.   omeprazole (PRILOSEC) 40 MG capsule Take 1 capsule (40 mg total) by mouth daily.   No facility-administered medications prior to visit.    Review of Systems  Constitutional:  Negative for appetite change, chills, fatigue and fever.  Respiratory:  Negative for chest tightness and shortness of breath.   Cardiovascular:  Negative for chest pain and palpitations.  Gastrointestinal:  Negative for abdominal pain, nausea and vomiting.  Neurological:  Negative for dizziness and weakness.       Objective    LMP 06/21/2019      Physical Exam   Awake, alert, oriented x 3. In no apparent distress   Assessment &  Plan     1. Mixed obsessional thoughts and acts Much better since escitalopram 20 was changed to fluvoxamine 100 in April  2. Panic disorder Remote history of panic attacks which were likely suppressed when she was taking escitalopram for anxiety. Unfortunately they have resurfaced with change to fluvoxamine. Fluvoxamine has been shown to be effective for control of panic attacks, but will likely need higher dose. She is to take 1 1/2 x '100mg'$  for the next 4-8 days, then increase to 2 x '100mg'$  daily for the next 2 weeks and let me know whether not panic attacks resolved. May need to  go up to '300mg'$  a day. Will likely need to divide dose BID at higher doses.     I discussed the assessment and treatment plan with the patient. The patient was provided an opportunity to ask questions and all were answered. The patient agreed with the plan and demonstrated an understanding of the instructions.   The patient was advised to call back or seek an in-person evaluation if the symptoms worsen or if the condition fails to improve as anticipated.  I provided 12 minutes of non-face-to-face time during this encounter.  The entirety of the information documented in the History of Present Illness, Review of Systems and Physical Exam were personally obtained by me. Portions of this information were initially documented by the [name] and reviewed by me for thoroughness and accuracy.    [name], MD [facility] [phone] (phone) [phone] (fax)  [place]

## 2022-07-03 ENCOUNTER — Telehealth (INDEPENDENT_AMBULATORY_CARE_PROVIDER_SITE_OTHER): Payer: BC Managed Care – PPO | Admitting: Family Medicine

## 2022-07-03 ENCOUNTER — Encounter: Payer: Self-pay | Admitting: Family Medicine

## 2022-07-03 DIAGNOSIS — F422 Mixed obsessional thoughts and acts: Secondary | ICD-10-CM | POA: Diagnosis not present

## 2022-07-03 DIAGNOSIS — F410 Panic disorder [episodic paroxysmal anxiety] without agoraphobia: Secondary | ICD-10-CM | POA: Diagnosis not present

## 2022-07-07 ENCOUNTER — Telehealth: Payer: Self-pay

## 2022-07-07 NOTE — Telephone Encounter (Signed)
Patient called and informed of recent Signatera results. Patient informed that Johnsie Cancel would be reaching out to schedule her for follow-up testing again in three months. Patient verbalized an understanding of the information and was appreciative of the call.

## 2022-07-22 ENCOUNTER — Encounter: Payer: Self-pay | Admitting: Family Medicine

## 2022-07-22 ENCOUNTER — Encounter: Payer: Self-pay | Admitting: Hematology and Oncology

## 2022-07-22 DIAGNOSIS — F422 Mixed obsessional thoughts and acts: Secondary | ICD-10-CM

## 2022-07-22 DIAGNOSIS — F410 Panic disorder [episodic paroxysmal anxiety] without agoraphobia: Secondary | ICD-10-CM

## 2022-07-23 MED ORDER — FLUVOXAMINE MALEATE 100 MG PO TABS: 100.0000 mg | ORAL_TABLET | Freq: Two times a day (BID) | ORAL | 2 refills | Status: DC

## 2022-07-23 NOTE — Telephone Encounter (Signed)
Please review request.  Lov 07/03/2022 dosage was increased.

## 2022-10-16 ENCOUNTER — Encounter: Payer: Self-pay | Admitting: Family Medicine

## 2022-11-25 NOTE — Progress Notes (Unsigned)
I,Hoover Grewe S Emmy Keng,acting as a Education administrator for Lavon Paganini, MD.,have documented all relevant documentation on the behalf of Lavon Paganini, MD,as directed by  Lavon Paganini, MD while in the presence of Lavon Paganini, MD.   MyChart Video Visit    Virtual Visit via Video Note   This format is felt to be most appropriate for this patient at this time. Physical exam was limited by quality of the video and audio technology used for the visit.    Patient location: home Provider location: [place] involved in the visit: patient, provider   I discussed the limitations of evaluation and management by telemedicine and the availability of in person appointments. The patient expressed understanding and agreed to proceed.  Patient: Virginia [name]   DOB: September 18, 1966   57 y.o. Female  MRN: [id number] Visit Date: 11/26/2022  Today's healthcare provider: Lavon Paganini, MD   Chief Complaint  Patient presents with   URI   Subjective    HPI  Upper respiratory symptoms She complains of bilateral ear pressure/pain, congestion, headache described as pressure, nasal congestion, no  fever, and post nasal drip.with no fever, chills, night sweats or weight loss. Onset of symptoms was a few weeks ago and worsening.She is drinking plenty of fluids.  Past history is significant for no history of pneumonia or bronchitis. Patient is non-smoker   Tried sinus relief generic and dayquil/nyquil with no relief.  Tried mucinex and felt sinuses draining. Also using nettipot. Sinus pressure comes right back and even worse. ---------------------------------------------------------------------------------------------------   Medications: Outpatient Medications Prior to Visit  Medication Sig   cetirizine (ZYRTEC) 10 MG tablet Take 10 mg by mouth daily.   Cholecalciferol (VITAMIN D3) 250 MCG (10000 UT) TABS Take by mouth.   cyclobenzaprine (FLEXERIL) 10 MG tablet Take  0.5-1 tablets (5-10 mg total) by mouth 3 (three) times daily as needed for muscle spasms.   fluticasone (FLONASE) 50 MCG/ACT nasal spray Place 2 sprays into both nostrils daily.   fluvoxaMINE (LUVOX) 100 MG tablet Take 1 tablet (100 mg total) by mouth 2 (two) times daily.   naproxen (NAPROSYN) 500 MG tablet Take 1 tablet (500 mg total) by mouth 2 (two) times daily with a meal.   omeprazole (PRILOSEC) 40 MG capsule Take 1 capsule (40 mg total) by mouth daily.   No facility-administered medications prior to visit.    Review of Systems per HPI     Objective    LMP 06/21/2019      Physical Exam Constitutional:      General: She is not in acute distress.    Appearance: Normal appearance.  HENT:     Head: Normocephalic.  Pulmonary:     Effort: Pulmonary effort is normal. No respiratory distress.  Neurological:     Mental Status: She is alert and oriented to person, place, and time. Mental status is at baseline.        Assessment & Plan    1. Acute non-recurrent pansinusitis - symptoms and exam c/w sinusitis   - no evidence of AOM, CAP, strep pharyngitis, or other infection - given duration of symptoms, suspect bacterial etiology - will treat with Augmentin x7d - discussed symptomatic management (flonase, decongestants, etc), natural course, and return precautions   Meds ordered this encounter  Medications   amoxicillin-clavulanate (AUGMENTIN) 875-125 MG tablet    Sig: Take 1 tablet by mouth 2 (two) times daily for 7 days.    Dispense:  14 tablet    Refill:  0  Return if symptoms worsen or fail to improve.     I discussed the assessment and treatment plan with the patient. The patient was provided an opportunity to ask questions and all were answered. The patient agreed with the plan and demonstrated an understanding of the instructions.   The patient was advised to call back or seek an in-person evaluation if the symptoms worsen or if the condition fails to  improve as anticipated.  I, Lavon Paganini, MD, have reviewed all documentation for this visit. The documentation on 11/26/22 for the exam, diagnosis, procedures, and orders are all accurate and complete.   Bacigalupo, Dionne Bucy, MD, MPH [facility]

## 2022-11-26 ENCOUNTER — Encounter: Payer: Self-pay | Admitting: Family Medicine

## 2022-11-26 ENCOUNTER — Telehealth (INDEPENDENT_AMBULATORY_CARE_PROVIDER_SITE_OTHER): Payer: BC Managed Care – PPO | Admitting: Family Medicine

## 2022-11-26 DIAGNOSIS — J0140 Acute pansinusitis, unspecified: Secondary | ICD-10-CM

## 2022-11-26 MED ORDER — AMOXICILLIN-POT CLAVULANATE 875-125 MG PO TABS: 1.0000 | ORAL_TABLET | Freq: Two times a day (BID) | ORAL | 0 refills | Status: AC

## 2022-12-03 ENCOUNTER — Encounter: Payer: Self-pay | Admitting: Hematology and Oncology

## 2022-12-04 ENCOUNTER — Encounter: Payer: Self-pay | Admitting: Hematology and Oncology

## 2022-12-25 ENCOUNTER — Encounter: Payer: Self-pay | Admitting: Hematology and Oncology

## 2022-12-25 NOTE — Telephone Encounter (Signed)
Called pt per MD to advise Signatera testing was negative/not detected. Pt verbalized understanding of results and knows [facility] will be in touch to schedule 3 mo repeat lab.   

## 2022-12-28 ENCOUNTER — Telehealth: Payer: Self-pay

## 2023-02-03 ENCOUNTER — Telehealth: Payer: Self-pay | Admitting: *Deleted

## 2023-02-03 NOTE — Telephone Encounter (Signed)
Per MD request, RN placed call to pt regarding negative (Not Detected) recent Signatera testing.  Pt appreciative of call and verbalized understanding.  

## 2023-02-05 ENCOUNTER — Encounter: Payer: Self-pay | Admitting: Hematology and Oncology

## 2023-03-31 ENCOUNTER — Other Ambulatory Visit: Payer: Self-pay | Admitting: Hematology and Oncology

## 2023-04-06 ENCOUNTER — Encounter: Payer: Self-pay | Admitting: Hematology and Oncology

## 2023-04-07 ENCOUNTER — Other Ambulatory Visit: Payer: Self-pay

## 2023-04-07 DIAGNOSIS — Z171 Estrogen receptor negative status [ER-]: Secondary | ICD-10-CM

## 2023-05-03 ENCOUNTER — Telehealth: Payer: Self-pay | Admitting: *Deleted

## 2023-05-03 LAB — SIGNATERA ONLY (NATERA MANAGED)
SIGNATERA MTM READOUT: 0 MTM/ml
SIGNATERA TEST RESULT: NEGATIVE

## 2023-05-03 NOTE — Telephone Encounter (Signed)
Per MD request, RN placed call to pt regarding negative (Not Detected) recent Signatera testing.  No answer, LVM for pt to return call to the office. 

## 2023-05-07 NOTE — Progress Notes (Unsigned)
Complete physical exam   Patient: Virginia [name]   DOB: 11/03/1966   57 y.o. Female  MRN: [id number] Visit Date: 05/10/2023  Today's healthcare provider: Shirlee Latch, MD   No chief complaint on file.  Subjective    Virginia [name] is a 57 y.o. female who presents today for a complete physical exam.  She reports consuming a {diet types:17450} diet. {Exercise:19826} She generally feels {well/fairly well/poorly:18703}. She reports sleeping {well/fairly well/poorly:18703}. She {does/does not:200015} have additional problems to discuss today.  HPI    Past Medical History:  Diagnosis Date   Anorexia    Anxiety    Arthritis    knees   Cancer (HCC)    R Breast Cancer 2021   Family history of breast cancer    Family history of lung cancer    Family history of pancreatic cancer    Fibroids    Wears glasses    Past Surgical History:  Procedure Laterality Date   BREAST RECONSTRUCTION WITH PLACEMENT OF TISSUE EXPANDER AND ALLODERM Bilateral 08/06/2020   Procedure: BILATERAL BREAST RECONSTRUCTION WITH PLACEMENT OF TISSUE EXPANDER AND ALLODERM;  Surgeon: Glenna Fellows, MD;  Location: [facility];  Service: Plastics;  Laterality: Bilateral;   BREAST RECONSTRUCTION WITH PLACEMENT OF TISSUE EXPANDER AND ALLODERM Right 11/26/2020   Procedure: BREAST RECONSTRUCTION WITH PLACEMENT OF TISSUE EXPANDER AND ALLODERM;  Surgeon: Glenna Fellows, MD;  Location: [facility];  Service: Plastics;  Laterality: Right;   CLEFT PALATE REPAIR     CLEFT PALATE REPAIR  1970   COLONOSCOPY WITH PROPOFOL N/A 04/25/2018   Procedure: COLONOSCOPY WITH PROPOFOL;  Surgeon: Midge Minium, MD;  Location: [facility];  Service: Endoscopy;  Laterality: N/A;   LIPOSUCTION WITH LIPOFILLING Bilateral 02/21/2021   Procedure: LIPOSUCTION FROM ABDOMEN AND BILATERAL FLANKS; LIPOFILLING TO BILATERAL CHEST;  Surgeon: Glenna Fellows, MD;  Location: [facility];   Service: Plastics;  Laterality: Bilateral;   NIPPLE SPARING MASTECTOMY WITH SENTINEL LYMPH NODE BIOPSY Bilateral 08/06/2020   Procedure: BILATERAL NIPPLE SPARING MASTECTOMIES WITH RIGHT AXILLARY  SENTINEL LYMPH NODE MAPPING;  Surgeon: Harriette Bouillon, MD;  Location: [facility];  Service: General;  Laterality: Bilateral;  PECTORAL BLOCK   PORT-A-CATH REMOVAL Right 08/06/2020   Procedure: REMOVAL PORT-A-CATH;  Surgeon: Harriette Bouillon, MD;  Location: [facility];  Service: General;  Laterality: Right;   PORTACATH PLACEMENT Right 02/22/2020   Procedure: INSERTION PORT-A-CATH WITH ULTRASOUND GUIDANCE;  Surgeon: Harriette Bouillon, MD;  Location: [facility];  Service: General;  Laterality: Right;   REMOVAL OF BILATERAL TISSUE EXPANDERS WITH PLACEMENT OF BILATERAL BREAST IMPLANTS Bilateral 02/21/2021   Procedure: REMOVAL OF BILATERAL TISSUE EXPANDERS WITH PLACEMENT OF BILATERAL BREAST SILICONE IMPLANTS;  Surgeon: Glenna Fellows, MD;  Location:  SUR[facility]ERY [facility];  Service: Plastics;  Laterality: Bilateral;   REMOVAL OF TISSUE EXPANDER AND PLACEMENT OF IMPLANT Bilateral 08/25/2020   Procedure: REMOVAL OF RIGHT BREAST TISSUE EXPANDER; DEBRIDEMENT BILATERAL MASTECTOMY FLAP NIPPLES;  Surgeon: Glenna Fellows, MD;  Location: [facility];  Service: Plastics;  Laterality: Bilateral;   Social History   Socioeconomic History   Marital status: Married    Spouse name: Brendolyn Patty   Number of children: 2   Years of education: 16   Highest education level: Bachelor's degree (e.g., BA, AB, BS)  Occupational History   Occupation: works on [employer]  Tobacco Use   Smoking status: Never   Smokeless tobacco: Never  Vaping Use  Vaping Use: Never used  Substance and Sexual Activity   Alcohol use: No    Alcohol/week: 0.0 standard drinks of alcohol   Drug use: No   Sexual activity: Yes    Partners: Male    Birth control/protection: Surgical    Comment: husband s/p  vasectomy  Other Topics Concern   Not on file  Social History Narrative   Lives with husband   Social Determinants of Health   Financial Resource Strain: Low Risk  (03/30/2018)   Overall Financial Resource Strain (CARDIA)    Difficulty of Paying Living Expenses: Not hard at all  Food Insecurity: No Food Insecurity (03/30/2018)   Hunger Vital Sign    Worried About Running Out of Food in the Last Year: Never true    Ran Out of Food in the Last Year: Never true  Transportation Needs: No Transportation Needs (03/30/2018)   PRAPARE - Administrator, Civil Service (Medical): No    Lack of Transportation (Non-Medical): No  Physical Activity: Not on file  Stress: No Stress Concern Present (03/30/2018)   [employer] of Occupational Health - Occupational Stress Questionnaire    Feeling of Stress : Not at all  Social Connections: Not on file  Intimate Partner Violence: Not on file   Family Status  Relation Name Status   Mother  Alive   Father  Alive   Daughter  Alive   MGM  Deceased   Oceanographer  (Not Specified)   Oneal Grout  Deceased   Sister  Alive   Sister  Alive   Mat Uncle x2 Deceased   Son  Alive   Mat Aunt x2 Alive   MGF  Deceased   PGM  Deceased   PGF  Deceased   Cousin pat Alive   Pat Aunt  Deceased   Neg Hx  (Not Specified)   Family History  Problem Relation Age of Onset   Anxiety disorder Mother    Heart disease Father 43       CABG x4   Diabetes Father    Hypertension Father    Hypothyroidism Father    Anxiety disorder Daughter    Heart disease Maternal Grandmother    Stroke Maternal Grandmother 50   Heart disease Paternal Aunt    Stroke Paternal Aunt    Heart disease Paternal Uncle    Pancreatic cancer Paternal Uncle        dx 54s   Hypertension Sister    Hyperlipidemia Sister    Hypothyroidism Sister    Lung cancer Maternal Uncle    Healthy Son    Breast cancer Cousin    Lung cancer Paternal Aunt    Colon cancer Neg Hx    Ovarian  cancer Neg Hx    Cervical cancer Neg Hx    No Known Allergies  Patient Care Team: Bacigalupo, Marzella Schlein, MD as PCP - General (Family Medicine) Harriette Bouillon, MD as Consulting Physician (General Surgery) Serena Croissant, MD as Consulting Physician (Hematology and Oncology) Glenna Fellows, MD as Consulting Physician (Plastic Surgery)   Medications: Outpatient Medications Prior to Visit  Medication Sig   cetirizine (ZYRTEC) 10 MG tablet Take 10 mg by mouth daily.   Cholecalciferol (VITAMIN D3) 250 MCG (10000 UT) TABS Take by mouth.   cyclobenzaprine (FLEXERIL) 10 MG tablet Take 0.5-1 tablets (5-10 mg total) by mouth 3 (three) times daily as needed for muscle spasms.   fluticasone (FLONASE) 50 MCG/ACT nasal spray Place 2 sprays into both nostrils  daily.   fluvoxaMINE (LUVOX) 100 MG tablet Take 1 tablet (100 mg total) by mouth 2 (two) times daily.   naproxen (NAPROSYN) 500 MG tablet Take 1 tablet (500 mg total) by mouth 2 (two) times daily with a meal.   omeprazole (PRILOSEC) 40 MG capsule Take 1 capsule (40 mg total) by mouth daily.   No facility-administered medications prior to visit.    Review of Systems  All other systems reviewed and are negative.   Last CBC Lab Results  Component Value Date   WBC 6.2 05/07/2022   HGB 13.9 05/07/2022   HCT 40.9 05/07/2022   MCV 88 05/07/2022   MCH 29.8 05/07/2022   RDW 12.4 05/07/2022   PLT 184 05/07/2022   Last metabolic panel Lab Results  Component Value Date   GLUCOSE 93 05/07/2022   NA 139 05/07/2022   K 4.5 05/07/2022   CL 101 05/07/2022   CO2 23 05/07/2022   BUN 18 05/07/2022   CREATININE 0.67 05/07/2022   EGFR 104 05/07/2022   CALCIUM 9.7 05/07/2022   PROT 6.9 05/07/2022   ALBUMIN 4.6 05/07/2022   LABGLOB 2.3 05/07/2022   AGRATIO 2.0 05/07/2022   BILITOT 0.3 05/07/2022   ALKPHOS 79 05/07/2022   AST 13 05/07/2022   ALT 11 05/07/2022   ANIONGAP 9 08/02/2020   Last lipids Lab Results  Component Value Date   CHOL  176 05/07/2022   HDL 62 05/07/2022   LDLCALC 96 05/07/2022   TRIG 102 05/07/2022   CHOLHDL 2.7 04/20/2019   Last hemoglobin A1c Lab Results  Component Value Date   HGBA1C 5.1 06/09/2017   Last thyroid functions Lab Results  Component Value Date   TSH 1.080 05/07/2022   Last vitamin D Lab Results  Component Value Date   VD25OH 33.4 05/07/2022      Objective    LMP 06/21/2019  BP Readings from Last 3 Encounters:  05/29/22 (!) 123/92  05/07/22 113/79  10/23/21 109/70   Wt Readings from Last 3 Encounters:  05/29/22 144 lb (65.3 kg)  05/07/22 142 lb (64.4 kg)  10/23/21 143 lb 4.8 oz (65 kg)       Physical Exam  ***  Last depression screening scores    05/07/2022    9:07 AM 12/01/2021    2:56 PM 10/23/2021    3:23 PM  PHQ 2/9 Scores  PHQ - 2 Score 2 2 5   PHQ- 9 Score 4 3 11    Last fall risk screening    05/07/2022    9:07 AM  Fall Risk   Falls in the past year? 0  Number falls in past yr: 0  Injury with Fall? 0  Risk for fall due to : No Fall Risks  Follow up Falls evaluation completed   Last Audit-C alcohol use screening    05/07/2022    9:08 AM  Alcohol Use Disorder Test (AUDIT)  1. How often do you have a drink containing alcohol? 0  2. How many drinks containing alcohol do you have on a typical day when you are drinking? 0  3. How often do you have six or more drinks on one occasion? 0  AUDIT-C Score 0   A score of 3 or more in women, and 4 or more in men indicates increased risk for alcohol abuse, EXCEPT if all of the points are from question 1   No results found for any visits on 05/10/23.  Assessment & Plan    Routine Health Maintenance and  Physical Exam  Exercise Activities and Dietary recommendations  Goals   None     Immunization History  Administered Date(s) Administered   Influenza,inj,Quad PF,6+ Mos 08/17/2019   Influenza-Unspecified 08/08/2015, 09/13/2020, 10/16/2021   PFIZER(Purple Top)SARS-COV-2 Vaccination 11/15/2019,  12/05/2019, 10/18/2020   PNEUMOCOCCAL CONJUGATE-20 05/05/2021   Td 11/06/2010, 01/20/2021   Tdap 11/06/2010   Zoster Recombinat (Shingrix) 09/10/2019, 05/05/2021    Health Maintenance  Topic Date Due   COVID-19 Vaccine (4 - 2023-24 season) 07/17/2022   INFLUENZA VACCINE  06/17/2023   PAP SMEAR-Modifier  01/23/2025   Colonoscopy  04/25/2028   DTaP/Tdap/Td (4 - Td or Tdap) 01/21/2031   Hepatitis C Screening  Completed   HIV Screening  Completed   Zoster Vaccines- Shingrix  Completed   HPV VACCINES  Aged Out    Discussed health benefits of physical activity, and encouraged her to engage in regular exercise appropriate for her age and condition.  ***  No follow-ups on file.     {provider attestation***:1}   Shirlee Latch, MD  [facility] [phone] (phone) [phone] (fax)  [facility]

## 2023-05-10 ENCOUNTER — Encounter: Payer: Self-pay | Admitting: Family Medicine

## 2023-05-10 ENCOUNTER — Ambulatory Visit (INDEPENDENT_AMBULATORY_CARE_PROVIDER_SITE_OTHER): Payer: BC Managed Care – PPO | Admitting: Family Medicine

## 2023-05-10 VITALS — BP 118/89 | HR 84 | Temp 99.0°F | Ht 61.0 in | Wt 146.0 lb

## 2023-05-10 DIAGNOSIS — M170 Bilateral primary osteoarthritis of knee: Secondary | ICD-10-CM | POA: Diagnosis not present

## 2023-05-10 DIAGNOSIS — F4323 Adjustment disorder with mixed anxiety and depressed mood: Secondary | ICD-10-CM

## 2023-05-10 DIAGNOSIS — E663 Overweight: Secondary | ICD-10-CM | POA: Diagnosis not present

## 2023-05-10 DIAGNOSIS — Z171 Estrogen receptor negative status [ER-]: Secondary | ICD-10-CM

## 2023-05-10 DIAGNOSIS — F422 Mixed obsessional thoughts and acts: Secondary | ICD-10-CM

## 2023-05-10 DIAGNOSIS — Z8349 Family history of other endocrine, nutritional and metabolic diseases: Secondary | ICD-10-CM | POA: Diagnosis not present

## 2023-05-10 DIAGNOSIS — Z0000 Encounter for general adult medical examination without abnormal findings: Secondary | ICD-10-CM

## 2023-05-10 DIAGNOSIS — M546 Pain in thoracic spine: Secondary | ICD-10-CM

## 2023-05-10 DIAGNOSIS — E559 Vitamin D deficiency, unspecified: Secondary | ICD-10-CM | POA: Diagnosis not present

## 2023-05-10 DIAGNOSIS — C50411 Malignant neoplasm of upper-outer quadrant of right female breast: Secondary | ICD-10-CM | POA: Diagnosis not present

## 2023-05-10 DIAGNOSIS — G8929 Other chronic pain: Secondary | ICD-10-CM

## 2023-05-10 MED ORDER — OMEPRAZOLE 40 MG PO CPDR: 40.0000 mg | DELAYED_RELEASE_CAPSULE | Freq: Every day | ORAL | 3 refills | Status: AC

## 2023-05-11 ENCOUNTER — Other Ambulatory Visit: Payer: Self-pay

## 2023-05-11 ENCOUNTER — Encounter: Payer: Self-pay | Admitting: Hematology and Oncology

## 2023-05-11 LAB — LIPID PANEL WITH LDL/HDL RATIO
Cholesterol, Total: 205 mg/dL — ABNORMAL HIGH (ref 100–199)
HDL: 65 mg/dL (ref >39)
LDL Chol Calc (NIH): 126 mg/dL — ABNORMAL HIGH (ref 0–99)
LDL/HDL Ratio: 1.9 ratio (ref 0.0–3.2)
Triglycerides: 79 mg/dL (ref 0–149)
VLDL Cholesterol Cal: 14 mg/dL (ref 5–40)

## 2023-05-11 LAB — COMPREHENSIVE METABOLIC PANEL
ALT: 11 IU/L (ref 0–32)
AST: 13 IU/L (ref 0–40)
Albumin: 4.5 g/dL (ref 3.8–4.9)
Alkaline Phosphatase: 89 IU/L (ref 44–121)
BUN/Creatinine Ratio: 19 (ref 9–23)
BUN: 14 mg/dL (ref 6–24)
Bilirubin Total: 0.4 mg/dL (ref 0.0–1.2)
CO2: 23 mmol/L (ref 20–29)
Calcium: 9.1 mg/dL (ref 8.7–10.2)
Chloride: 104 mmol/L (ref 96–106)
Creatinine, Ser: 0.72 mg/dL (ref 0.57–1.00)
Globulin, Total: 2.1 g/dL (ref 1.5–4.5)
Glucose: 88 mg/dL (ref 70–99)
Potassium: 4.2 mmol/L (ref 3.5–5.2)
Sodium: 140 mmol/L (ref 134–144)
Total Protein: 6.6 g/dL (ref 6.0–8.5)
eGFR: 99 mL/min/{1.73_m2} (ref >59)

## 2023-05-11 LAB — CBC WITH DIFFERENTIAL/PLATELET
Basophils Absolute: 0 10*3/uL (ref 0.0–0.2)
Basos: 1 %
EOS (ABSOLUTE): 0.2 10*3/uL (ref 0.0–0.4)
Eos: 2 %
Hematocrit: 39.8 % (ref 34.0–46.6)
Hemoglobin: 12.9 g/dL (ref 11.1–15.9)
Immature Grans (Abs): 0 10*3/uL (ref 0.0–0.1)
Immature Granulocytes: 0 %
Lymphocytes Absolute: 1.7 10*3/uL (ref 0.7–3.1)
Lymphs: 27 %
MCH: 28.5 pg (ref 26.6–33.0)
MCHC: 32.4 g/dL (ref 31.5–35.7)
MCV: 88 fL (ref 79–97)
Monocytes Absolute: 0.5 10*3/uL (ref 0.1–0.9)
Monocytes: 8 %
Neutrophils Absolute: 3.8 10*3/uL (ref 1.4–7.0)
Neutrophils: 62 %
Platelets: 184 10*3/uL (ref 150–450)
RBC: 4.52 x10E6/uL (ref 3.77–5.28)
RDW: 12.4 % (ref 11.7–15.4)
WBC: 6.1 10*3/uL (ref 3.4–10.8)

## 2023-05-11 LAB — VITAMIN D 25 HYDROXY (VIT D DEFICIENCY, FRACTURES): Vit D, 25-Hydroxy: 32.9 ng/mL (ref 30.0–100.0)

## 2023-05-11 LAB — TSH: TSH: 1.12 u[IU]/mL (ref 0.450–4.500)

## 2023-05-17 ENCOUNTER — Encounter: Payer: Self-pay | Admitting: Family Medicine

## 2023-05-18 NOTE — Telephone Encounter (Signed)
General 05/18/2023 10:35 AM Maye Hides - -  Note: Pt given contact information to schedule appointment

## 2023-05-19 ENCOUNTER — Encounter: Payer: Self-pay | Admitting: Emergency Medicine

## 2023-05-19 ENCOUNTER — Ambulatory Visit
Admission: EM | Admit: 2023-05-19 | Discharge: 2023-05-19 | Disposition: A | Discharge status: Home / Self Care | Payer: BC Managed Care – PPO | Attending: Nurse Practitioner | Admitting: Nurse Practitioner

## 2023-05-19 DIAGNOSIS — B3731 Acute candidiasis of vulva and vagina: Secondary | ICD-10-CM | POA: Diagnosis not present

## 2023-05-19 DIAGNOSIS — M549 Dorsalgia, unspecified: Secondary | ICD-10-CM | POA: Insufficient documentation

## 2023-05-19 LAB — URINALYSIS, W/ REFLEX TO CULTURE (INFECTION SUSPECTED)
Bilirubin Urine: NEGATIVE
Glucose, UA: NEGATIVE mg/dL
Hgb urine dipstick: NEGATIVE
Ketones, ur: NEGATIVE mg/dL
Leukocytes,Ua: NEGATIVE
Nitrite: NEGATIVE
Protein, ur: NEGATIVE mg/dL
Specific Gravity, Urine: 1.02 (ref 1.005–1.030)
pH: 7.5 (ref 5.0–8.0)

## 2023-05-19 LAB — WET PREP, GENITAL
Clue Cells Wet Prep HPF POC: NONE SEEN
Sperm: NONE SEEN
Trich, Wet Prep: NONE SEEN
WBC, Wet Prep HPF POC: <10 (ref <10)

## 2023-05-19 MED ORDER — KETOROLAC TROMETHAMINE 30 MG/ML IJ SOLN
30.0000 mg | Freq: Once | INTRAMUSCULAR | Status: AC
  Administered 2023-05-19: 30 mg via INTRAMUSCULAR

## 2023-05-19 MED ORDER — FLUCONAZOLE 150 MG PO TABS
150.0000 mg | ORAL_TABLET | Freq: Every day | ORAL | 0 refills | Status: DC
Start: 2023-05-19 — End: 2023-05-28

## 2023-05-19 NOTE — ED Triage Notes (Signed)
Pt presents with left side back pain that radiates to her abdomen x 1 week.

## 2023-05-19 NOTE — Discharge Instructions (Signed)
You were given a Toradol injection in clinic today. Do not take any over the counter NSAID's such as Advil, ibuprofen, Aleve, or naproxen for 24 hours.  You may take tylenol if needed Take Diflucan x 1 to treat your yeast infection. Continue heat to the back as needed. Follow-up with your physical therapist at your scheduled appointment on Monday.  Please follow-up with your PCP if symptoms do not improve.  Please go to the emergency room for any worsening symptoms.

## 2023-05-19 NOTE — ED Provider Notes (Signed)
[facility]    CSN: [id number] Arrival date & time: 05/19/23  0955      History   Chief Complaint Chief Complaint  Patient presents with   Back Pain    HPI Virginia [name] is a 57 y.o. female presents for evaluation of back pain.  Patient reports 1 week of an intermittent left mid back pain that yesterday began to radiate around to her left upper abdomen.  Describes it as a aching/spasm pain.  Denies any nausea/vomiting/diarrhea, dysuria, hematuria, fevers or chills, saddle paresthesia, bowel or bladder incontinence, lower extremity weakness/numbness/tingling.  Denies any known injury or inciting event but does states she picks up her grandchildren often and does hold them on the left side.  No history of back surgeries or fractures.  She did take some Motrin yesterday as well as a muscle relaxer she had last night and states she was able to sleep fine without any pain.  Pain is worse with movement.  She has not taken anything today for pain.  No other concerns at this time.   Back Pain   Past Medical History:  Diagnosis Date   Anorexia    Anxiety    Arthritis    knees   Cancer (HCC)    R Breast Cancer 2021   Family history of breast cancer    Family history of lung cancer    Family history of pancreatic cancer    Fibroids    Wears glasses     Patient Active Problem List   Diagnosis Date Noted   Primary osteoarthritis of both knees 05/10/2023   Mixed obsessional thoughts and acts 10/23/2021   Adjustment disorder with mixed anxiety and depressed mood 10/23/2021   Overweight 01/20/2021   Breast cancer, right (HCC) 08/06/2020   Port-A-Cath in place 03/22/2020   Genetic testing 02/26/2020   Family history of pancreatic cancer    Family history of breast cancer    Family history of lung cancer    Malignant neoplasm of upper-outer quadrant of right breast in female, estrogen receptor negative (HCC) 02/08/2020   Disordered eating 09/30/2017   Avitaminosis D  09/30/2017   Family history of thyroid disease 09/30/2017   Fibroid 08/31/2017   Irregular bleeding 08/19/2017   Arthralgia of hip 10/07/2015    Past Surgical History:  Procedure Laterality Date   BREAST RECONSTRUCTION WITH PLACEMENT OF TISSUE EXPANDER AND ALLODERM Bilateral 08/06/2020   Procedure: BILATERAL BREAST RECONSTRUCTION WITH PLACEMENT OF TISSUE EXPANDER AND ALLODERM;  Surgeon: Glenna Fellows, MD;  Location: [facility];  Service: Plastics;  Laterality: Bilateral;   BREAST RECONSTRUCTION WITH PLACEMENT OF TISSUE EXPANDER AND ALLODERM Right 11/26/2020   Procedure: BREAST RECONSTRUCTION WITH PLACEMENT OF TISSUE EXPANDER AND ALLODERM;  Surgeon: Glenna Fellows, MD;  Location: [facility];  Service: Plastics;  Laterality: Right;   CLEFT PALATE REPAIR     CLEFT PALATE REPAIR  1970   COLONOSCOPY WITH PROPOFOL N/A 04/25/2018   Procedure: COLONOSCOPY WITH PROPOFOL;  Surgeon: Midge Minium, MD;  Location: [facility];  Service: Endoscopy;  Laterality: N/A;   LIPOSUCTION WITH LIPOFILLING Bilateral 02/21/2021   Procedure: LIPOSUCTION FROM ABDOMEN AND BILATERAL FLANKS; LIPOFILLING TO BILATERAL CHEST;  Surgeon: Glenna Fellows, MD;  Location: [facility];  Service: Plastics;  Laterality: Bilateral;   NIPPLE SPARING MASTECTOMY WITH SENTINEL LYMPH NODE BIOPSY Bilateral 08/06/2020   Procedure: BILATERAL NIPPLE SPARING MASTECTOMIES WITH RIGHT AXILLARY  SENTINEL LYMPH NODE MAPPING;  Surgeon: Harriette Bouillon, MD;  Location:  [facility];  Service: General;  Laterality: Bilateral;  PECTORAL BLOCK   PORT-A-CATH REMOVAL Right 08/06/2020   Procedure: REMOVAL PORT-A-CATH;  Surgeon: Harriette Bouillon, MD;  Location: [facility];  Service: General;  Laterality: Right;   PORTACATH PLACEMENT Right 02/22/2020   Procedure: INSERTION PORT-A-CATH WITH ULTRASOUND GUIDANCE;  Surgeon: Harriette Bouillon, MD;  Location: [facility];  Service: General;   Laterality: Right;   REMOVAL OF BILATERAL TISSUE EXPANDERS WITH PLACEMENT OF BILATERAL BREAST IMPLANTS Bilateral 02/21/2021   Procedure: REMOVAL OF BILATERAL TISSUE EXPANDERS WITH PLACEMENT OF BILATERAL BREAST SILICONE IMPLANTS;  Surgeon: Glenna Fellows, MD;  Location: [facility];  Service: Plastics;  Laterality: Bilateral;   REMOVAL OF TISSUE EXPANDER AND PLACEMENT OF IMPLANT Bilateral 08/25/2020   Procedure: REMOVAL OF RIGHT BREAST TISSUE EXPANDER; DEBRIDEMENT BILATERAL MASTECTOMY FLAP NIPPLES;  Surgeon: Glenna Fellows, MD;  Location: [facility];  Service: Plastics;  Laterality: Bilateral;    OB History     Gravida  3   Para  2   Term      Preterm      AB  1   Living  2      SAB  1   IAB      Ectopic      Multiple      Live Births               Home Medications    Prior to Admission medications   Medication Sig Start Date End Date Taking? Authorizing Provider  cetirizine (ZYRTEC) 10 MG tablet Take 10 mg by mouth daily.    [provider]  cyclobenzaprine (FLEXERIL) 10 MG tablet Take 0.5-1 tablets (5-10 mg total) by mouth 3 (three) times daily as needed for muscle spasms. 06/04/22   Margaretann Loveless, PA-C  fluconazole (DIFLUCAN) 150 MG tablet Take 1 tablet (150 mg total) by mouth daily. 05/19/23  Yes Radford Pax, NP  fluticasone (FLONASE) 50 MCG/ACT nasal spray Place 2 sprays into both nostrils daily. 10/01/21   Malva Limes, MD  fluvoxaMINE (LUVOX) 100 MG tablet Take 1 tablet (100 mg total) by mouth 2 (two) times daily. 07/23/22   Malva Limes, MD  omeprazole (PRILOSEC) 40 MG capsule Take 1 capsule (40 mg total) by mouth daily. 05/10/23   Erasmo Downer, MD    Family History Family History  Problem Relation Age of Onset   Anxiety disorder Mother    Heart disease Father 61       CABG x4   Diabetes Father    Hypertension Father    Hypothyroidism Father    Anxiety disorder Daughter    Heart disease Maternal Grandmother     Stroke Maternal Grandmother 4   Heart disease Paternal Aunt    Stroke Paternal Aunt    Heart disease Paternal Uncle    Pancreatic cancer Paternal Uncle        dx 31s   Hypertension Sister    Hyperlipidemia Sister    Hypothyroidism Sister    Lung cancer Maternal Uncle    Healthy Son    Breast cancer Cousin    Lung cancer Paternal Aunt    Colon cancer Neg Hx    Ovarian cancer Neg Hx    Cervical cancer Neg Hx     Social History Social History   Tobacco Use   Smoking status: Never   Smokeless tobacco: Never  Vaping Use   Vaping Use: Never used  Substance Use Topics  Alcohol use: No    Alcohol/week: 0.0 standard drinks of alcohol   Drug use: No     Allergies   Patient has no known allergies.   Review of Systems Review of Systems  Musculoskeletal:  Positive for back pain.     Physical Exam Triage Vital Signs ED Triage Vitals  Enc Vitals Group     BP 05/19/23 1015 102/78     Pulse Rate 05/19/23 1015 91     Resp 05/19/23 1015 16     Temp 05/19/23 1015 98.4 F (36.9 C)     Temp Source 05/19/23 1015 Oral     SpO2 05/19/23 1015 100 %     Weight --      Height --      Head Circumference --      Peak Flow --      Pain Score 05/19/23 1014 4     Pain Loc --      Pain Edu? --      Excl. in GC? --    No data found.  Updated Vital Signs BP 102/78 (BP Location: Left Arm)   Pulse 91   Temp 98.4 F (36.9 C) (Oral)   Resp 16   LMP 06/21/2019   SpO2 100%   Visual Acuity Right Eye Distance:   Left Eye Distance:   Bilateral Distance:    Right Eye Near:   Left Eye Near:    Bilateral Near:     Physical Exam Vitals and nursing note reviewed.  Constitutional:      General: She is not in acute distress.    Appearance: Normal appearance. She is not ill-appearing.  HENT:     Head: Normocephalic and atraumatic.  Eyes:     Pupils: Pupils are equal, round, and reactive to light.  Cardiovascular:     Rate and Rhythm: Normal rate.  Pulmonary:     Effort:  Pulmonary effort is normal.  Musculoskeletal:     Cervical back: Normal.     Thoracic back: Spasms present. No swelling, edema, deformity, signs of trauma, lacerations, tenderness or bony tenderness. Normal range of motion. No scoliosis.     Lumbar back: Normal.       Back:  Skin:    General: Skin is warm and dry.  Neurological:     General: No focal deficit present.     Mental Status: She is alert and oriented to person, place, and time.  Psychiatric:        Mood and Affect: Mood normal.        Behavior: Behavior normal.      UC Treatments / Results  Labs (all labs ordered are listed, but only abnormal results are displayed) Labs Reviewed  WET PREP, GENITAL - Abnormal; Notable for the following components:      Result Value   Yeast Wet Prep HPF POC PRESENT (*)    All other components within normal limits  URINALYSIS, W/ REFLEX TO CULTURE (INFECTION SUSPECTED) - Abnormal; Notable for the following components:   Bacteria, UA FEW (*)    All other components within normal limits  URINE CULTURE    EKG   Radiology No results found.  Procedures Procedures (including critical care time)  Medications Ordered in UC Medications  ketorolac (TORADOL) 30 MG/ML injection 30 mg (30 mg Intramuscular Given 05/19/23 1104)    Initial Impression / Assessment and Plan / UC Course  I have reviewed the triage vital signs and the nursing notes.  Pertinent labs &  imaging results that were available during my care of the patient were reviewed by me and considered in my medical decision making (see chart for details).  Clinical Course as of 05/19/23 1127  Wed May 19, 2023  1047 Budding Yeast: PRESENT [JM]    Clinical Course User Index [JM] Radford Pax, NP    Patient given Toradol injection in clinic.  Patient was monitored for 10 minutes after injection with no reaction noted and tolerated well.  Reports improvement of symptoms after Toradol.  Will do urine culture.  Given yeast in  the urine patient did do a wet prep which was positive for yeast.  Start Diflucan.  Discussed symptoms consistent with MSK cause of symptoms.  Patient will continue her previously prescribed muscle relaxer as needed.  She can take OTC analgesics as needed.  Heat to the back as needed.  She is to follow-up with her physical therapist at her scheduled appointment on Monday.  Follow-up with PCP if symptoms do not improve.  ER precautions reviewed. Final Clinical Impressions(s) / UC Diagnoses   Final diagnoses:  Mid back pain on left side  Vaginal yeast infection     Discharge Instructions      You were given a Toradol injection in clinic today. Do not take any over the counter NSAID's such as Advil, ibuprofen, Aleve, or naproxen for 24 hours.  You may take tylenol if needed Take Diflucan x 1 to treat your yeast infection. Continue heat to the back as needed. Follow-up with your physical therapist at your scheduled appointment on Monday.  Please follow-up with your PCP if symptoms do not improve.  Please go to the emergency room for any worsening symptoms.     ED Prescriptions     Medication Sig Dispense Auth. Provider   fluconazole (DIFLUCAN) 150 MG tablet Take 1 tablet (150 mg total) by mouth daily. 1 tablet Radford Pax, NP      PDMP not reviewed this encounter.   Radford Pax, NP 05/19/23 [phone]

## 2023-05-20 ENCOUNTER — Other Ambulatory Visit: Payer: Self-pay | Admitting: Family Medicine

## 2023-05-20 DIAGNOSIS — F422 Mixed obsessional thoughts and acts: Secondary | ICD-10-CM

## 2023-05-20 DIAGNOSIS — F410 Panic disorder [episodic paroxysmal anxiety] without agoraphobia: Secondary | ICD-10-CM

## 2023-05-20 LAB — URINE CULTURE: Culture: NO GROWTH

## 2023-05-21 ENCOUNTER — Encounter: Payer: Self-pay | Admitting: Family Medicine

## 2023-05-24 ENCOUNTER — Ambulatory Visit: Payer: BC Managed Care – PPO | Attending: Family Medicine

## 2023-05-24 ENCOUNTER — Encounter: Payer: Self-pay | Admitting: Hematology and Oncology

## 2023-05-24 DIAGNOSIS — M546 Pain in thoracic spine: Secondary | ICD-10-CM | POA: Insufficient documentation

## 2023-05-24 DIAGNOSIS — G8929 Other chronic pain: Secondary | ICD-10-CM | POA: Diagnosis present

## 2023-05-24 NOTE — Therapy (Signed)
OUTPATIENT PHYSICAL THERAPY THORACOLUMBAR EVALUATION   Patient Name: Virginia [name] MRN: [id number] DOB:Jul 27, 1966, 57 y.o., female Today's Date: 05/24/2023  END OF SESSION:   Past Medical History:  Diagnosis Date   Anorexia    Anxiety    Arthritis    knees   Cancer (HCC)    R Breast Cancer 2021   Family history of breast cancer    Family history of lung cancer    Family history of pancreatic cancer    Fibroids    Wears glasses    Past Surgical History:  Procedure Laterality Date   BREAST RECONSTRUCTION WITH PLACEMENT OF TISSUE EXPANDER AND ALLODERM Bilateral 08/06/2020   Procedure: BILATERAL BREAST RECONSTRUCTION WITH PLACEMENT OF TISSUE EXPANDER AND ALLODERM;  Surgeon: Glenna Fellows, MD;  Location: [facility];  Service: Plastics;  Laterality: Bilateral;   BREAST RECONSTRUCTION WITH PLACEMENT OF TISSUE EXPANDER AND ALLODERM Right 11/26/2020   Procedure: BREAST RECONSTRUCTION WITH PLACEMENT OF TISSUE EXPANDER AND ALLODERM;  Surgeon: Glenna Fellows, MD;  Location: [facility];  Service: Plastics;  Laterality: Right;   CLEFT PALATE REPAIR     CLEFT PALATE REPAIR  1970   COLONOSCOPY WITH PROPOFOL N/A 04/25/2018   Procedure: COLONOSCOPY WITH PROPOFOL;  Surgeon: Midge Minium, MD;  Location: [facility];  Service: Endoscopy;  Laterality: N/A;   LIPOSUCTION WITH LIPOFILLING Bilateral 02/21/2021   Procedure: LIPOSUCTION FROM ABDOMEN AND BILATERAL FLANKS; LIPOFILLING TO BILATERAL CHEST;  Surgeon: Glenna Fellows, MD;  Location: [facility];  Service: Plastics;  Laterality: Bilateral;   NIPPLE SPARING MASTECTOMY WITH SENTINEL LYMPH NODE BIOPSY Bilateral 08/06/2020   Procedure: BILATERAL NIPPLE SPARING MASTECTOMIES WITH RIGHT AXILLARY  SENTINEL LYMPH NODE MAPPING;  Surgeon: Harriette Bouillon, MD;  Location: [facility];  Service: General;  Laterality: Bilateral;  PECTORAL BLOCK   PORT-A-CATH REMOVAL Right 08/06/2020    Procedure: REMOVAL PORT-A-CATH;  Surgeon: Harriette Bouillon, MD;  [facility];  Service: General;  Laterality: Right;   PORTACATH PLACEMENT Right 02/22/2020   Procedure: INSERTION PORT-A-CATH WITH ULTRASOUND GUIDANCE;  Surgeon: Harriette Bouillon, MD;  Location: [facility];  Service: General;  Laterality: Right;   REMOVAL OF BILATERAL TISSUE EXPANDERS WITH PLACEMENT OF BILATERAL BREAST IMPLANTS Bilateral 02/21/2021   Procedure: REMOVAL OF BILATERAL TISSUE EXPANDERS WITH PLACEMENT OF BILATERAL BREAST SILICONE IMPLANTS;  Surgeon: Glenna Fellows, MD;  Location:  [facility];  Service: Plastics;  Laterality: Bilateral;   REMOVAL OF TISSUE EXPANDER AND PLACEMENT OF IMPLANT Bilateral 08/25/2020   Procedure: REMOVAL OF RIGHT BREAST TISSUE EXPANDER; DEBRIDEMENT BILATERAL MASTECTOMY FLAP NIPPLES;  Surgeon: Glenna Fellows, MD;  Location: [facility];  Service: Plastics;  Laterality: Bilateral;   Patient Active Problem List   Diagnosis Date Noted   Primary osteoarthritis of both knees 05/10/2023   Mixed obsessional thoughts and acts 10/23/2021   Adjustment disorder with mixed anxiety and depressed mood 10/23/2021   Overweight 01/20/2021   Breast cancer, right (HCC) 08/06/2020   Port-A-Cath in place 03/22/2020   Genetic testing 02/26/2020   Family history of pancreatic cancer    Family history of breast cancer    Family history of lung cancer    Malignant neoplasm of upper-outer quadrant of right breast in female, estrogen receptor negative (HCC) 02/08/2020   Disordered eating 09/30/2017   Avitaminosis D 09/30/2017   Family history of thyroid disease 09/30/2017   Fibroid 08/31/2017   Irregular bleeding 08/19/2017   Arthralgia of hip 10/07/2015    PCP: Dr. Beryle Flock, MD  REFERRING PROVIDER: same  REFERRING DIAG: M54.6,G89.29 (ICD-10-CM) - Chronic left-sided thoracic back pain   Rationale for Evaluation and Treatment: Rehabilitation  THERAPY DIAG:  Pain in thoracic  spine  Other chronic pain  ONSET DATE: about a year  SUBJECTIVE:                                                                                                                                                                                           SUBJECTIVE STATEMENT: L sided low back pain; intermittent sx; overall feeling about the same; pain 4/10 at worst and 0/10 at best; can calm down within an hour  PERTINENT HISTORY:  She reports starting to notice her back pain about a year ago; she attributes it to lifting her grandkids (ages 68 mo and 2 years).  She reports it feels like a "pinching" sensation.  She carries the kids on her L hip.  She has been taking muscle relaxers/NSAIDs intermittently for the past few weeks.  She reports it may be helping.    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  She typically enjoys exercising 4x/week at the gym.  And she enjoys walking too,~30 min walks a few times a week.     Agg factors: slouching, lifting her grandkids (sometimes), carrying them for longer duration, sitting >45 minutes, sitting on floor playing with grandkids  Alleviating factors: being in an upright position, heat; not interfering with sleep    PAIN:  Are you having pain? {OPRCPAIN:27236}  PRECAUTIONS: None  WEIGHT BEARING RESTRICTIONS: No  FALLS:  Has patient fallen in last 6 months? No  LIVING ENVIRONMENT: Lives with: lives with their family and lives with their spouse Lives in: House/apartment Stairs: yes, doesn't limit her from getting up or down Has following equipment at home: None  OCCUPATION: Runner, broadcasting/film/video- high school Psychologist, occupational, works PRN at lab at the [employer]  PLOF: Independent  PATIENT GOALS: Pt reports she would like to learn how to pick up the grandkids and perform her daily activities in a way that does not aggravate her back; to get stronger to be able to lift the kids again   NEXT MD VISIT: none scheduled  OBJECTIVE:    DIAGNOSTIC FINDINGS:  ***  PATIENT SURVEYS:  {rehab surveys:24030}  SCREENING FOR RED FLAGS: Bowel or bladder incontinence: {Yes/No:304960894} Spinal tumors: {Yes/No:304960894} Cauda equina syndrome: {Yes/No:304960894} Compression fracture: {Yes/No:304960894} Abdominal aneurysm: {Yes/No:304960894}  COGNITION: Overall cognitive status: {cognition:24006}     SENSATION: {sensation:27233}  MUSCLE LENGTH: Hamstrings: Right *** deg; Left *** deg Maisie Fus test: Right *** deg; Left *** deg  POSTURE: {posture:25561}  PALPATION: ***  LUMBAR ROM:   AROM eval  Flexion   Extension   Right lateral flexion   Left lateral flexion   Right rotation   Left rotation    (Blank rows = not tested)  LOWER EXTREMITY ROM:     {AROM/PROM:27142}  Right eval Left eval  Hip flexion    Hip extension    Hip abduction    Hip adduction    Hip internal rotation    Hip external rotation    Knee flexion    Knee extension    Ankle dorsiflexion    Ankle plantarflexion    Ankle inversion    Ankle eversion     (Blank rows = not tested)  LOWER EXTREMITY MMT:    MMT Right eval Left eval  Hip flexion    Hip extension    Hip abduction    Hip adduction    Hip internal rotation    Hip external rotation    Knee flexion    Knee extension    Ankle dorsiflexion    Ankle plantarflexion    Ankle inversion    Ankle eversion     (Blank rows = not tested)  LUMBAR SPECIAL TESTS:  {lumbar special test:25242}  FUNCTIONAL TESTS:  {Functional tests:24029}  GAIT: Distance walked: *** Assistive device utilized: {Assistive devices:23999} Level of assistance: {Levels of assistance:24026} Comments: ***  TODAY'S TREATMENT:                                                                                                                              DATE: ***    PATIENT EDUCATION:  Education details: *** Person educated: {Person educated:25204} Education method: {Education  Method:25205} Education comprehension: {Education Comprehension:25206}  HOME EXERCISE PROGRAM: ***  ASSESSMENT:  CLINICAL IMPRESSION: Patient is a *** y.o. *** who was seen today for physical therapy evaluation and treatment for ***.   OBJECTIVE IMPAIRMENTS: {opptimpairments:25111}.   ACTIVITY LIMITATIONS: {activitylimitations:27494}  PARTICIPATION LIMITATIONS: {participationrestrictions:25113}  PERSONAL FACTORS: {Personal factors:25162} are also affecting patient's functional outcome.   REHAB POTENTIAL: {rehabpotential:25112}  CLINICAL DECISION MAKING: {clinical decision making:25114}  EVALUATION COMPLEXITY: {Evaluation complexity:25115}   GOALS: Goals reviewed with patient? {yes/no:20286}  SHORT TERM GOALS: Target date: ***  *** Baseline: Goal status: INITIAL  2.  *** Baseline:  Goal status: INITIAL  3.  *** Baseline:  Goal status: INITIAL  4.  *** Baseline:  Goal status: INITIAL  5.  *** Baseline:  Goal status: INITIAL  6.  *** Baseline:  Goal status: INITIAL  LONG TERM GOALS: Target date: ***  *** Baseline:  Goal status: INITIAL  2.  *** Baseline:  Goal status: INITIAL  3.  *** Baseline:  Goal status: INITIAL  4.  *** Baseline:  Goal status: INITIAL  5.  *** Baseline:  Goal status: INITIAL  6.  *** Baseline:  Goal status: INITIAL  PLAN:  PT FREQUENCY: {rehab frequency:25116}  PT DURATION: {rehab duration:25117}  PLANNED INTERVENTIONS: {rehab planned  interventions:25118::"Therapeutic exercises","Therapeutic activity","Neuromuscular re-education","Balance training","Gait training","Patient/Family education","Self Care","Joint mobilization"}.  PLAN FOR NEXT SESSION: ***   Ardine Bjork, PT 05/24/2023, 3:48 PM.

## 2023-05-25 ENCOUNTER — Telehealth: Payer: Self-pay

## 2023-05-25 NOTE — Telephone Encounter (Signed)
Called pt regarding MyChart message. She reports pain to her thoracic area to left side for at least 2 weeks. She denies pain on inspiration and states "bending aggravates it" she also states the pain is intermittent but she was concerned as Dr Pamelia Hoit told her if she ever has back pain to let us know. Pt was offered appt with Lillard Anes, NP 05/28/23 at 1015 and she accepted.

## 2023-05-26 ENCOUNTER — Ambulatory Visit: Payer: BC Managed Care – PPO

## 2023-05-26 DIAGNOSIS — G8929 Other chronic pain: Secondary | ICD-10-CM

## 2023-05-26 DIAGNOSIS — M546 Pain in thoracic spine: Secondary | ICD-10-CM | POA: Diagnosis not present

## 2023-05-26 NOTE — Therapy (Signed)
OUTPATIENT PHYSICAL THERAPY THORACOLUMBAR TREATMENT  Patient Name: Virginia [name] MRN: [id number] DOB:06/09/1966, 57 y.o., female Today's Date: 05/26/2023  END OF SESSION:  PT End of Session - 05/26/23 1519     Visit Number 2    Number of Visits 12    Date for PT Re-Evaluation 07/21/23    Authorization Type [employer]    Authorization Time Period 12x/week x 8 weeks (12 visits)    PT Start Time 1115    PT Stop Time 1200    PT Time Calculation (min) 45 min    Activity Tolerance Patient tolerated treatment well             Past Medical History:  Diagnosis Date   Anorexia    Anxiety    Arthritis    knees   Cancer (HCC)    R Breast Cancer 2021   Family history of breast cancer    Family history of lung cancer    Family history of pancreatic cancer    Fibroids    Wears glasses    Past Surgical History:  Procedure Laterality Date   BREAST RECONSTRUCTION WITH PLACEMENT OF TISSUE EXPANDER AND ALLODERM Bilateral 08/06/2020   Procedure: BILATERAL BREAST RECONSTRUCTION WITH PLACEMENT OF TISSUE EXPANDER AND ALLODERM;  Surgeon: Glenna Fellows, MD;  Location: [facility];  Service: Plastics;  Laterality: Bilateral;   BREAST RECONSTRUCTION WITH PLACEMENT OF TISSUE EXPANDER AND ALLODERM Right 11/26/2020   Procedure: BREAST RECONSTRUCTION WITH PLACEMENT OF TISSUE EXPANDER AND ALLODERM;  Surgeon: Glenna Fellows, MD;  Location: [facility];  Service: Plastics;  Laterality: Right;   CLEFT PALATE REPAIR     CLEFT PALATE REPAIR  1970   COLONOSCOPY WITH PROPOFOL N/A 04/25/2018   Procedure: COLONOSCOPY WITH PROPOFOL;  Surgeon: Midge Minium, MD;  Location: [facility];  Service: Endoscopy;  Laterality: N/A;   LIPOSUCTION WITH LIPOFILLING Bilateral 02/21/2021   Procedure: LIPOSUCTION FROM ABDOMEN AND BILATERAL FLANKS; LIPOFILLING TO BILATERAL CHEST;  Surgeon: Glenna Fellows, MD;  Location: [facility];  Service: Plastics;  Laterality:  Bilateral;   NIPPLE SPARING MASTECTOMY WITH SENTINEL LYMPH NODE BIOPSY Bilateral 08/06/2020   Procedure: BILATERAL NIPPLE SPARING MASTECTOMIES WITH RIGHT AXILLARY  SENTINEL LYMPH NODE MAPPING;  Surgeon: Harriette Bouillon, MD;  Location: [facility];  Service: General;  Laterality: Bilateral;  PECTORAL BLOCK   PORT-A-CATH REMOVAL Right 08/06/2020   Procedure: REMOVAL PORT-A-CATH;  Surgeon: Harriette Bouillon, MD;  Location: [facility];  Service: General;  Laterality: Right;   PORTACATH PLACEMENT Right 02/22/2020   Procedure: INSERTION PORT-A-CATH WITH ULTRASOUND GUIDANCE;  Surgeon: Harriette Bouillon, MD;  Location: [facility];  Service: General;  Laterality: Right;   REMOVAL OF BILATERAL TISSUE EXPANDERS WITH PLACEMENT OF BILATERAL BREAST IMPLANTS Bilateral 02/21/2021   Procedure: REMOVAL OF BILATERAL TISSUE EXPANDERS WITH PLACEMENT OF BILATERAL BREAST SILICONE IMPLANTS;  Surgeon: Glenna Fellows, MD;  Location:  [facility];  Service: Plastics;  Laterality: Bilateral;   REMOVAL OF TISSUE EXPANDER AND PLACEMENT OF IMPLANT Bilateral 08/25/2020   Procedure: REMOVAL OF RIGHT BREAST TISSUE EXPANDER; DEBRIDEMENT BILATERAL MASTECTOMY FLAP NIPPLES;  Surgeon: Glenna Fellows, MD;  Location: [facility];  Service: Plastics;  Laterality: Bilateral;   Patient Active Problem List   Diagnosis Date Noted   Primary osteoarthritis of both knees 05/10/2023   Mixed obsessional thoughts and acts 10/23/2021   Adjustment disorder with mixed anxiety and depressed mood 10/23/2021   Overweight 01/20/2021   Breast cancer, right (HCC) 08/06/2020   Port-A-Cath  in place 03/22/2020   Genetic testing 02/26/2020   Family history of pancreatic cancer    Family history of breast cancer    Family history of lung cancer    Malignant neoplasm of upper-outer quadrant of right breast in female, estrogen receptor negative (HCC) 02/08/2020   Disordered eating 09/30/2017   Avitaminosis D 09/30/2017   Family  history of thyroid disease 09/30/2017   Fibroid 08/31/2017   Irregular bleeding 08/19/2017   Arthralgia of hip 10/07/2015    PCP: Dr. Beryle Flock, MD  REFERRING PROVIDER: same  REFERRING DIAG: M54.6,G89.29 (ICD-10-CM) - Chronic left-sided thoracic back pain   Rationale for Evaluation and Treatment: Rehabilitation  THERAPY DIAG:  Pain in thoracic spine  Other chronic pain  ONSET DATE: about a year  SUBJECTIVE:        From initial evaluation note 05/24/23:                                                                                                                                                                                       SUBJECTIVE STATEMENT: L sided low/mid back pain; intermittent sx; overall feeling about the same; pain 4/10 at worst and 0/10 at best; irritability: can calm down within a minutes to an hour   PERTINENT HISTORY:  She reports starting to notice her back pain about a year ago; she attributes it to lifting her grandkids (ages 78 mo and 2 years) more frequently.  She reports it feels like a "pinching" sensation.  She carries the kids on her L hip when she holds them.  They live out of town so she does not see them regularly, but when she does see them lifting, carrying, and holding them is important to her. She has been taking muscle relaxers/NSAIDs intermittently for the past few weeks.  She reports these medications may be helping.    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  She typically enjoys exercising 4x/week at the gym.  And she enjoys walking too,~30 min walks a few times a week.  Would like to learn exercises to strengthen her back to prevent episodes of back pain.  Agg factors: slouching, lifting her grandkids (sometimes), carrying them for longer duration, sitting >45 minutes, sitting on floor playing with grandkids  Alleviating factors: being in an upright position, heat; not interfering with sleep    PAIN:  Are you having  pain? Yes, 0-4/10 pain  PRECAUTIONS: None  WEIGHT BEARING RESTRICTIONS: No  FALLS:  Has patient fallen in last 6 months? No  LIVING ENVIRONMENT: Lives with: lives with their family and lives with their spouse Lives in: House/apartment Stairs:  yes, doesn't limit her from getting up or down Has following equipment at home: None  OCCUPATION: teacher- high school health science, works PRN at lab at the [employer]  PLOF: Independent  PATIENT GOALS: Pt reports she would like to learn how to pick up the grandkids and perform her daily activities in a way that does not aggravate her back; to get stronger to be able to lift the kids again   NEXT MD VISIT: none scheduled  OBJECTIVE:   DIAGNOSTIC FINDINGS:  none  PATIENT SURVEYS:  FOTO 71/82  SCREENING FOR RED FLAGS: Bowel or bladder incontinence: No Spinal tumors: No Cauda equina syndrome: No Compression fracture: No Abdominal aneurysm: No Pt does have h/o breast cancer  COGNITION: Overall cognitive status: Within functional limits for tasks assessed     SENSATION: WFL  MUSCLE LENGTH: Normal hamstring flexibility b/l  POSTURE: rounded shoulders in seated position; pt able to sit more upright with cues and this is not painful  PALPATION: TTP and reproduction of typical pain with L thoracolumbar paraspinal, mid trap, rhomboid mm; palpable MTPs  THORACOLUMBAR ROM:   AROM eval  Flexion Normal *  Extension Min limited *  Right lateral flexion normal  Left lateral flexion normal  Right rotation Min limited  Left rotation Min limited *   (Blank rows = not tested)  LOWER EXTREMITY ROM:     Active  Right eval Left eval  Hip flexion    Hip extension    Hip abduction    Hip adduction    Hip internal rotation    Hip external rotation    Knee flexion    Knee extension    Ankle dorsiflexion    Ankle plantarflexion    Ankle inversion    Ankle eversion     (Blank rows = not tested)  LOWER EXTREMITY MMT:     MMT Right eval Left eval  Hip flexion WNL WNL  Hip extension    Hip abduction    Hip adduction    Hip internal rotation WNL WNL  Hip external rotation WNL WNL  Knee flexion WNL WNL  Knee extension WNL WNL  Ankle dorsiflexion    Ankle plantarflexion    Ankle inversion    Ankle eversion     (Blank rows = not tested)  LUMBAR SPECIAL TESTS:  Quadrant test: Positive reproduction of pain with extension + L rotation (+) hypomobility and reproduction of pain with PAM of T12-L2 CPA, L UPA; improved with repeated; also (+) reproduction of sx with PAM L ribs angle  FUNCTIONAL TESTS:  Pt able to perform sit to stand, sit to supine/prone transfers with normal body mechanics  GAIT: Pt amb without antalgic gait in clinic  TODAY'S TREATMENT:                                                                                                                              DATE: 05/26/23  Subjective: Pt reports she  notices her back is sore when she sits for her 40 min commute in her car.  Sx are intermittent.  She reports her sleep is not influenced by her sx.  She does not report any unexplained weight changes in the past few months.  She does not notice the pain with breathing.  She does notice the pain with sitting, bending, lifting, carrying movements.  She does report feeling anxious about her back pain with her h/o breast cancer, and she has proactively reached out to her oncology office.  She has an appointment Friday to also discuss her concerns related to her back pain.    Pain: 0/10 upon arrival  Objective: Manual Therapy: Prone thoracic CPA T8-12, L1-2 Gr II/III, L rib angle PA mob T8-12; 30 sec bouts STM L thoracolumbar paraspinal, quadratus lumborum mm  Therapeutic Exercise:  Cat/cow x10 Child's pose: with diaphragmatic breathing, 4x 20 seconds, forehead supported on table Sidelying thoracic rotation (open book): x8 ea side Supine alternating shoulder flexion: x15 ea Seated  thoracic extension with hands behind neck x10  Seated posture with lumbar roll: practiced in firm chair, pt reports this feels helpful; discussed strategy of using lumbar roll in car as a position of comfort and to promote neutral spine position while driving  Initiated HEP today- see below, gave handout   PATIENT EDUCATION:  Education details: PT POC/goals, tx to addres thoracolumbar pain and hypomobility with manual therapy, ROM and postural mm retraining Person educated: Patient Education method: Explanation Education comprehension: verbalized understanding  HOME EXERCISE PROGRAM: Access Code: [email or web address] URL: [email or web address] Date: 05/26/2023 Prepared by: Max Fickle  Exercises - Cat Cow  - 1 x daily - 5 x weekly - 2 sets - 10 reps - Child's Pose Stretch  - 1 x daily - 5 x weekly - 30 sec hold - Sidelying Thoracic Rotation with Open Book  - 1 x daily - 5 x weekly - 1 sets - 10 reps - Supine Alternating Shoulder Flexion  - 1 x daily - 5 x weekly - 2 sets - 10 reps - Seated Thoracic Lumbar Extension with Pectoralis Stretch  - 1 x daily - 5 x weekly - 1 sets - 10 reps  ASSESSMENT:  CLINICAL IMPRESSION: Initiated manual therapy techniques to address thoracolumbar spine hypomobility and painful myofascial trigger points.  Reproduced typical sx with PA mobilizations of T11-L1 today; sx decreased with continued mobilization.  Pt tolerated tx well and was able to perform thoracolumbar AROM exercises afterwards without noting increased pain.  She does note her typical "tightness" on L during sidelying thoracic rotation to L.  Continue to suspect mechanical MSK sx origin as able to reproduce typical sx with movement.  She is following up with her oncologist though later this week to discuss concerns of back pain as she has a h/o cancer in the past.  I am supportive of her decision to see her MD.  Plan to continue with PT tx plan at next session if all clear by  MD.  OBJECTIVE IMPAIRMENTS: decreased activity tolerance, decreased ROM, hypomobility, impaired perceived functional ability, increased muscle spasms, and pain.   ACTIVITY LIMITATIONS: carrying, lifting, bending, sitting, and caring for others  PARTICIPATION LIMITATIONS: cleaning, interpersonal relationship, and community activity  PERSONAL FACTORS: Time since onset of injury/illness/exacerbation are also affecting patient's functional outcome.   REHAB POTENTIAL: Excellent  CLINICAL DECISION MAKING: Stable/uncomplicated  EVALUATION COMPLEXITY: Low   GOALS: Goals reviewed with patient? Yes  SHORT TERM GOALS: Target date: 06/05/23  Pt will  demonstrate ability to perform an HEP for thoracolumbar spine ROM/mobility independently Baseline: to be initated at visit #2 Goal status: INITIAL    LONG TERM GOALS: Target date: 07/21/23  Improve FOTO to >81 indicating pt is able to perform her daily activities including lifting, carrying grandkids, bending, and prolonged sitting without being limited by back pain Baseline: 71/82 at initial evaluation Goal status: INITIAL  2.  Improve thoracolumbar AROM to normal ROM without pain in all directions to facilitate improved ability to bend, lift, carry, sit to care for grandkids without being limited by low back pain  Baseline: limited and painful extension and rotation, and painful flexion Goal status: INITIAL  3.  Pt will be independent with a HEP/gym program that includes trunk and postural mm retraining/strengthening to promote being able  bend, lift, carry with <2/10 back pain Baseline: going to gym but not sure which strengthening to focus on for her back Goal status: INITIAL   PLAN:  PT FREQUENCY: 1-2x/week  PT DURATION: 8 weeks  PLANNED INTERVENTIONS: Therapeutic exercises, Therapeutic activity, Neuromuscular re-education, Balance training, Gait training, Patient/Family education, Self Care, and Joint mobilization.  PLAN FOR NEXT  SESSION: plan to continue with PT including manual therapy, assess response to using lumbar roll in car; progress HEP to include postural mm retraining for her to perform independently at home/gym as she notes finances are a barrier to attending PT regularly   Ardine Bjork, PT 05/26/2023, 3:28 PM. Max Fickle, PT, DPT, OCS  [phone]

## 2023-05-28 ENCOUNTER — Other Ambulatory Visit: Payer: Self-pay

## 2023-05-28 ENCOUNTER — Ambulatory Visit (HOSPITAL_COMMUNITY)
Admission: RE | Admit: 2023-05-28 | Discharge: 2023-05-28 | Disposition: A | Discharge status: Home / Self Care | Payer: BC Managed Care – PPO | Source: Ambulatory Visit | Attending: Adult Health | Admitting: Adult Health

## 2023-05-28 ENCOUNTER — Inpatient Hospital Stay: Payer: BC Managed Care – PPO | Attending: Adult Health | Admitting: Adult Health

## 2023-05-28 ENCOUNTER — Encounter: Payer: Self-pay | Admitting: Adult Health

## 2023-05-28 VITALS — BP 110/73 | HR 104 | Temp 97.9°F | Resp 16 | Wt 143.5 lb

## 2023-05-28 DIAGNOSIS — Z9013 Acquired absence of bilateral breasts and nipples: Secondary | ICD-10-CM | POA: Insufficient documentation

## 2023-05-28 DIAGNOSIS — Z171 Estrogen receptor negative status [ER-]: Secondary | ICD-10-CM | POA: Diagnosis not present

## 2023-05-28 DIAGNOSIS — C50411 Malignant neoplasm of upper-outer quadrant of right female breast: Secondary | ICD-10-CM | POA: Insufficient documentation

## 2023-05-28 DIAGNOSIS — Z800 Family history of malignant neoplasm of digestive organs: Secondary | ICD-10-CM | POA: Insufficient documentation

## 2023-05-28 DIAGNOSIS — Z79899 Other long term (current) drug therapy: Secondary | ICD-10-CM | POA: Insufficient documentation

## 2023-05-28 DIAGNOSIS — Z8249 Family history of ischemic heart disease and other diseases of the circulatory system: Secondary | ICD-10-CM | POA: Diagnosis not present

## 2023-05-28 DIAGNOSIS — Z801 Family history of malignant neoplasm of trachea, bronchus and lung: Secondary | ICD-10-CM | POA: Diagnosis not present

## 2023-05-28 DIAGNOSIS — Z823 Family history of stroke: Secondary | ICD-10-CM | POA: Diagnosis not present

## 2023-05-28 DIAGNOSIS — Z818 Family history of other mental and behavioral disorders: Secondary | ICD-10-CM | POA: Diagnosis not present

## 2023-05-28 DIAGNOSIS — M546 Pain in thoracic spine: Secondary | ICD-10-CM | POA: Insufficient documentation

## 2023-05-28 DIAGNOSIS — Z833 Family history of diabetes mellitus: Secondary | ICD-10-CM | POA: Diagnosis not present

## 2023-05-28 DIAGNOSIS — Z8349 Family history of other endocrine, nutritional and metabolic diseases: Secondary | ICD-10-CM | POA: Diagnosis not present

## 2023-05-28 DIAGNOSIS — Z803 Family history of malignant neoplasm of breast: Secondary | ICD-10-CM | POA: Diagnosis not present

## 2023-05-28 NOTE — Assessment & Plan Note (Signed)
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  Stage Ib triple negative breast cancer: Signatera testing has been negative.  She will continue to undergo this. Thoracic back pain: We will obtain x-rays of the thoracic spine and bilateral ribs and chest to further evaluate.  This is likely musculoskeletal in nature.  I recommended that she take Aleve 1 tablet twice daily for couple weeks to help decrease inflammation along with continuing physical therapy.  Should her x-rays be positive for something going on the bones we will reach out to her as soon as we have those results.  Should her x-rays be negative we are following up with her in 2 weeks to discuss whether MRI is necessary.  RTC in 2 weeks for virtual follow-up to see how she is feeling with her pain.

## 2023-05-28 NOTE — Progress Notes (Signed)
[facility]cer Center Cancer Follow up:    Erasmo Downer, MD [address] [place] [address]   DIAGNOSIS:  Cancer Staging  Malignant neoplasm of upper-outer quadrant of right breast in female, estrogen receptor negative (HCC) Staging form: Breast, AJCC 8th Edition - Clinical stage from 02/08/2020: Stage IB (cT1c, cN0, cM0, G3, ER-, PR-, HER2-) - Signed by Serena Croissant, MD on 02/08/2020 Stage prefix: Initial diagnosis Histologic grading system: 3 grade system - Pathologic stage from 08/06/2020: No Stage Recommended (ypT0, pN0, cM0) - Signed by Loa Socks, NP on 11/22/2020 Stage prefix: Post-therapy   SUMMARY OF ONCOLOGIC HISTORY: Oncology History  Malignant neoplasm of upper-outer quadrant of right breast in female, estrogen receptor negative (HCC)  02/02/2020 Initial Diagnosis   Right breast lump tenderness. Diagnostic mammogram and [place] on 02/01/20 showed a 2.0cm mass at the 10 o'clock position with no axillary adenopathy. 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, ER/PR negative, Ki67 80%   02/08/2020 Cancer Staging   Staging form: Breast, AJCC 8th Edition - Clinical stage from 02/08/2020: Stage IB (cT1c, cN0, cM0, G3, ER-, PR-, HER2-) - Signed by Serena Croissant, MD on 02/08/2020   02/23/2020 - 06/14/2020 Neo-Adjuvant Chemotherapy   Adriamycin and Cytoxan x4 followed by Taxol and carboplatin x7 discontinued    02/25/2020 Genetic Testing   Negative. Genes tested: APC, ATM, AXIN2, BARD1, BMPR1A, BRCA1, BRCA2, BRIP1, CDH1, CDKN2A (p14ARF), CDKN2A (p16INK4a), CKD4, CHEK2, CTNNA1, DICER1, EPCAM (Deletion/duplication testing only), GREM1 (promoter region deletion/duplication testing only), KIT, MEN1, MLH1, MSH2, MSH3, MSH6, MUTYH, NBN, NF1, NHTL1, PALB2, PDGFRA, PMS2, POLD1, POLE, PTEN, RAD50, RAD51C, RAD51D, RNF43, SDHB, SDHC, SDHD, SMAD4, SMARCA4. STK11, TP53, TSC1, TSC2, and VHL.  The following genes were evaluated for sequence changes  only: SDHA and HOXB13 c.251G>A variant only.   08/06/2020 Surgery   Bilateral mastectomies ([place]): no residual invasive carcinoma in the right breast and no evidence of malignancy on the left breast, with 2 right axillary lymph nodes negative for carcinoma.     CURRENT THERAPY: observation  INTERVAL HISTORY: Esperanza Sheets 57 y.o. female returns for urgent evaluation of thoracic back pain she has been experiencing for 2 weeks.  She is on observation alone for history of triple negative breast cancer that was diagnosed and treated in 2021.  Signatera testing has been negative for recurrence.    By her PCP who ordered physical therapy.  She is still having some discomfort in the thoracic spine that is radiating along the dermatome around her chest wall.   Patient Active Problem List   Diagnosis Date Noted   Primary osteoarthritis of both knees 05/10/2023   Mixed obsessional thoughts and acts 10/23/2021   Adjustment disorder with mixed anxiety and depressed mood 10/23/2021   Overweight 01/20/2021   Port-A-Cath in place 03/22/2020   Genetic testing 02/26/2020   Family history of pancreatic cancer    Family history of breast cancer    Family history of lung cancer    Malignant neoplasm of upper-outer quadrant of right breast in female, estrogen receptor negative (HCC) 02/08/2020   Disordered eating 09/30/2017   Avitaminosis D 09/30/2017   Family history of thyroid disease 09/30/2017   Fibroid 08/31/2017   Irregular bleeding 08/19/2017   Arthralgia of hip 10/07/2015    has No Known Allergies.  MEDICAL HISTORY: Past Medical History:  Diagnosis Date   Anorexia    Anxiety    Arthritis    knees   Cancer (HCC)  R Breast Cancer 2021   Family history of breast cancer    Family history of lung cancer    Family history of pancreatic cancer    Fibroids    Wears glasses     SURGICAL HISTORY: Past Surgical History:  Procedure Laterality Date   BREAST RECONSTRUCTION WITH  PLACEMENT OF TISSUE EXPANDER AND ALLODERM Bilateral 08/06/2020   Procedure: BILATERAL BREAST RECONSTRUCTION WITH PLACEMENT OF TISSUE EXPANDER AND ALLODERM;  Surgeon: Glenna Fellows, MD;  Location: [facility];  Service: Plastics;  Laterality: Bilateral;   BREAST RECONSTRUCTION WITH PLACEMENT OF TISSUE EXPANDER AND ALLODERM Right 11/26/2020   Procedure: BREAST RECONSTRUCTION WITH PLACEMENT OF TISSUE EXPANDER AND ALLODERM;  Surgeon: Glenna Fellows, MD;  Location: [facility];  Service: Plastics;  Laterality: Right;   CLEFT PALATE REPAIR     CLEFT PALATE REPAIR  1970   COLONOSCOPY WITH PROPOFOL N/A 04/25/2018   Procedure: COLONOSCOPY WITH PROPOFOL;  Surgeon: Midge Minium, MD;  Location: [facility];  Service: Endoscopy;  Laterality: N/A;   LIPOSUCTION WITH LIPOFILLING Bilateral 02/21/2021   Procedure: LIPOSUCTION FROM ABDOMEN AND BILATERAL FLANKS; LIPOFILLING TO BILATERAL CHEST;  Surgeon: Glenna Fellows, MD;  Location: [facility];  Service: Plastics;  Laterality: Bilateral;   NIPPLE SPARING MASTECTOMY WITH SENTINEL LYMPH NODE BIOPSY Bilateral 08/06/2020   Procedure: BILATERAL NIPPLE SPARING MASTECTOMIES WITH RIGHT AXILLARY  SENTINEL LYMPH NODE MAPPING;  Surgeon: Harriette Bouillon, MD;  Location: [facility];  Service: General;  Laterality: Bilateral;  PECTORAL BLOCK   PORT-A-CATH REMOVAL Right 08/06/2020   Procedure: REMOVAL PORT-A-CATH;  Surgeon: Harriette Bouillon, MD;  Location: [facility];  Service: General;  Laterality: Right;   PORTACATH PLACEMENT Right 02/22/2020   Procedure: INSERTION PORT-A-CATH WITH ULTRASOUND GUIDANCE;  Surgeon: Harriette Bouillon, MD;  Location: [facility];  Service: General;  Laterality: Right;   REMOVAL OF BILATERAL TISSUE EXPANDERS WITH PLACEMENT OF BILATERAL BREAST IMPLANTS Bilateral 02/21/2021   Procedure: REMOVAL OF BILATERAL TISSUE EXPANDERS WITH PLACEMENT OF BILATERAL BREAST SILICONE IMPLANTS;   Surgeon: Glenna Fellows, MD;  Location: [facility];  Service: Plastics;  Laterality: Bilateral;   REMOVAL OF TISSUE EXPANDER AND PLACEMENT OF IMPLANT Bilateral 08/25/2020   Procedure: REMOVAL OF RIGHT BREAST TISSUE EXPANDER; DEBRIDEMENT BILATERAL MASTECTOMY FLAP NIPPLES;  Surgeon: Glenna Fellows, MD;  Location: [facility];  Service: Plastics;  Laterality: Bilateral;    SOCIAL HISTORY: Social History   Socioeconomic History   Marital status: Married    Spouse name: Brendolyn Patty   Number of children: 2   Years of education: 16   Highest education level: Bachelor's degree (e.g., BA, AB, BS)  Occupational History   Occupation: works on [employer]  Tobacco Use   Smoking status: Never   Smokeless tobacco: Never  Vaping Use   Vaping status: Never Used  Substance and Sexual Activity   Alcohol use: No    Alcohol/week: 0.0 standard drinks of alcohol   Drug use: No   Sexual activity: Yes    Partners: Male    Birth control/protection: Surgical    Comment: husband s/p vasectomy  Other Topics Concern   Not on file  Social History Narrative   Lives with husband   Social Determinants of Health   Financial Resource Strain: Low Risk  (03/30/2018)   Overall Financial Resource Strain (CARDIA)    Difficulty of Paying Living Expenses: Not hard at all  Food Insecurity: No Food Insecurity (03/30/2018)   Hunger Vital Sign  Worried About Programme researcher, broadcasting/film/video in the Last Year: Never true    Ran Out of Food in the Last Year: Never true  Transportation Needs: No Transportation Needs (03/30/2018)   PRAPARE - Administrator, Civil Service (Medical): No    Lack of Transportation (Non-Medical): No  Physical Activity: Not on file  Stress: No Stress Concern Present (03/30/2018)   [employer] of Occupational Health - Occupational Stress Questionnaire    Feeling of Stress : Not at all  Social Connections: Not on file  Intimate Partner Violence: Not on file     FAMILY HISTORY: Family History  Problem Relation Age of Onset   Anxiety disorder Mother    Heart disease Father 26       CABG x4   Diabetes Father    Hypertension Father    Hypothyroidism Father    Anxiety disorder Daughter    Heart disease Maternal Grandmother    Stroke Maternal Grandmother 68   Heart disease Paternal Aunt    Stroke Paternal Aunt    Heart disease Paternal Uncle    Pancreatic cancer Paternal Uncle        dx 67s   Hypertension Sister    Hyperlipidemia Sister    Hypothyroidism Sister    Lung cancer Maternal Uncle    Healthy Son    Breast cancer Cousin    Lung cancer Paternal Aunt    Colon cancer Neg Hx    Ovarian cancer Neg Hx    Cervical cancer Neg Hx     Review of Systems  Constitutional:  Negative for appetite change, chills, fatigue, fever and unexpected weight change.  HENT:   Negative for hearing loss, lump/mass, mouth sores, sore throat and trouble swallowing.   Eyes:  Negative for eye problems and icterus.  Respiratory:  Negative for chest tightness, cough and shortness of breath.   Cardiovascular:  Negative for chest pain, leg swelling and palpitations.  Gastrointestinal:  Negative for abdominal distention, abdominal pain, constipation, diarrhea, nausea and vomiting.  Endocrine: Negative for hot flashes.  Genitourinary:  Negative for difficulty urinating.   Musculoskeletal:  Positive for back pain. Negative for arthralgias.  Skin:  Negative for itching and rash.  Neurological:  Negative for dizziness, extremity weakness, headaches and numbness.  Hematological:  Negative for adenopathy. Does not bruise/bleed easily.  Psychiatric/Behavioral:  Negative for depression. The patient is not nervous/anxious.       PHYSICAL EXAMINATION   Onc Performance Status - 05/28/23 1032       ECOG Perf Status   ECOG Perf Status Restricted in physically strenuous activity but ambulatory and able to carry out work of a light or sedentary nature, e.g., light  house work, office work      KPS SCALE   KPS % SCORE Able to carry on normal activity, minor s/s of disease             Vitals:   05/28/23 1021  BP: 110/73  Pulse: (!) 104  Resp: 16  Temp: 97.9 F (36.6 C)  SpO2: 100%    Physical Exam Constitutional:      General: She is not in acute distress.    Appearance: Normal appearance. She is not toxic-appearing.  HENT:     Head: Normocephalic and atraumatic.     Mouth/Throat:     Mouth: Mucous membranes are moist.     Pharynx: Oropharynx is clear. No oropharyngeal exudate or posterior oropharyngeal erythema.  Eyes:     General:  No scleral icterus. Cardiovascular:     Rate and Rhythm: Normal rate and regular rhythm.     Pulses: Normal pulses.     Heart sounds: Normal heart sounds.  Pulmonary:     Effort: Pulmonary effort is normal.     Breath sounds: Normal breath sounds.  Abdominal:     General: Abdomen is flat. Bowel sounds are normal. There is no distension.     Palpations: Abdomen is soft.     Tenderness: There is no abdominal tenderness. There is no right CVA tenderness or left CVA tenderness.  Musculoskeletal:        General: No swelling.     Cervical back: Neck supple.  Lymphadenopathy:     Cervical: No cervical adenopathy.  Skin:    General: Skin is warm and dry.     Findings: No rash.  Neurological:     General: No focal deficit present.     Mental Status: She is alert.  Psychiatric:        Mood and Affect: Mood normal.        Behavior: Behavior normal.      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  Stage Ib triple negative breast cancer: Signatera testing has been negative.  She will continue  to undergo this. Thoracic back pain: We will obtain x-rays of the thoracic spine and bilateral ribs and chest to further evaluate.  This is likely musculoskeletal in nature.  I recommended that she take Aleve 1 tablet twice daily for couple weeks to help decrease inflammation along with continuing physical therapy.  Should her x-rays be positive for something going on the bones we will reach out to her as soon as we have those results.  Should her x-rays be negative we are following up with her in 2 weeks to discuss whether MRI is necessary.  RTC in 2 weeks for virtual follow-up to see how she is feeling with her pain.   All questions were answered. The patient knows to call the clinic with any problems, questions or concerns. We can certainly see the patient much sooner if necessary.  Total encounter time:30 minutes*in face-to-face visit time, chart review, lab review, care coordination, order entry, and documentation of the encounter time.    Lillard Anes, NP 05/28/23 11:10 AM Medical Oncology and Hematology [facility] [address] [place], [place] [address] Tel. [phone]    Fax. [phone]  *Total Encounter Time as defined by the Centers for Medicare and Medicaid Services includes, in addition to the face-to-face time of a patient visit (documented in the note above) non-face-to-face time: obtaining and reviewing outside history, ordering and reviewing medications, tests or procedures, care coordination (communications with other health care professionals or caregivers) and documentation in the medical record.

## 2023-05-31 ENCOUNTER — Ambulatory Visit: Payer: BC Managed Care – PPO

## 2023-06-01 ENCOUNTER — Other Ambulatory Visit: Payer: Self-pay

## 2023-06-01 ENCOUNTER — Encounter (HOSPITAL_COMMUNITY): Payer: Self-pay

## 2023-06-01 ENCOUNTER — Emergency Department (HOSPITAL_COMMUNITY)
Admission: EM | Admit: 2023-06-01 | Discharge: 2023-06-01 | Disposition: A | Discharge status: Home / Self Care | Payer: BC Managed Care – PPO | Attending: Emergency Medicine | Admitting: Emergency Medicine

## 2023-06-01 ENCOUNTER — Encounter: Payer: Self-pay | Admitting: Adult Health

## 2023-06-01 ENCOUNTER — Emergency Department (HOSPITAL_COMMUNITY): Payer: BC Managed Care – PPO

## 2023-06-01 DIAGNOSIS — R1013 Epigastric pain: Secondary | ICD-10-CM | POA: Insufficient documentation

## 2023-06-01 DIAGNOSIS — M546 Pain in thoracic spine: Secondary | ICD-10-CM | POA: Insufficient documentation

## 2023-06-01 DIAGNOSIS — Z853 Personal history of malignant neoplasm of breast: Secondary | ICD-10-CM | POA: Insufficient documentation

## 2023-06-01 DIAGNOSIS — R531 Weakness: Secondary | ICD-10-CM | POA: Insufficient documentation

## 2023-06-01 DIAGNOSIS — M549 Dorsalgia, unspecified: Secondary | ICD-10-CM | POA: Diagnosis present

## 2023-06-01 LAB — COMPREHENSIVE METABOLIC PANEL
ALT: 16 U/L (ref 0–44)
AST: 16 U/L (ref 15–41)
Albumin: 4.2 g/dL (ref 3.5–5.0)
Alkaline Phosphatase: 76 U/L (ref 38–126)
Anion gap: 9 (ref 5–15)
BUN: 18 mg/dL (ref 6–20)
CO2: 26 mmol/L (ref 22–32)
Calcium: 9.4 mg/dL (ref 8.9–10.3)
Chloride: 104 mmol/L (ref 98–111)
Creatinine, Ser: 0.72 mg/dL (ref 0.44–1.00)
GFR, Estimated: >60 mL/min (ref >60)
Glucose, Bld: 133 mg/dL — ABNORMAL HIGH (ref 70–99)
Potassium: 3.9 mmol/L (ref 3.5–5.1)
Sodium: 139 mmol/L (ref 135–145)
Total Bilirubin: 0.6 mg/dL (ref 0.3–1.2)
Total Protein: 7.4 g/dL (ref 6.5–8.1)

## 2023-06-01 LAB — I-STAT CHEM 8, ED
BUN: 16 mg/dL (ref 6–20)
Calcium, Ion: 1.25 mmol/L (ref 1.15–1.40)
Chloride: 103 mmol/L (ref 98–111)
Creatinine, Ser: 0.7 mg/dL (ref 0.44–1.00)
Glucose, Bld: 125 mg/dL — ABNORMAL HIGH (ref 70–99)
HCT: 38 % (ref 36.0–46.0)
Hemoglobin: 12.9 g/dL (ref 12.0–15.0)
Potassium: 4.1 mmol/L (ref 3.5–5.1)
Sodium: 141 mmol/L (ref 135–145)
TCO2: 25 mmol/L (ref 22–32)

## 2023-06-01 LAB — CBC WITH DIFFERENTIAL/PLATELET
Abs Immature Granulocytes: 0.04 10*3/uL (ref 0.00–0.07)
Basophils Absolute: 0 10*3/uL (ref 0.0–0.1)
Basophils Relative: 0 %
Eosinophils Absolute: 0.2 10*3/uL (ref 0.0–0.5)
Eosinophils Relative: 2 %
HCT: 39.2 % (ref 36.0–46.0)
Hemoglobin: 12.9 g/dL (ref 12.0–15.0)
Immature Granulocytes: 0 %
Lymphocytes Relative: 25 %
Lymphs Abs: 2.5 10*3/uL (ref 0.7–4.0)
MCH: 29.1 pg (ref 26.0–34.0)
MCHC: 32.9 g/dL (ref 30.0–36.0)
MCV: 88.3 fL (ref 80.0–100.0)
Monocytes Absolute: 0.6 10*3/uL (ref 0.1–1.0)
Monocytes Relative: 6 %
Neutro Abs: 6.6 10*3/uL (ref 1.7–7.7)
Neutrophils Relative %: 67 %
Platelets: 201 10*3/uL (ref 150–400)
RBC: 4.44 MIL/uL (ref 3.87–5.11)
RDW: 12.7 % (ref 11.5–15.5)
WBC: 9.8 10*3/uL (ref 4.0–10.5)
nRBC: 0 % (ref 0.0–0.2)

## 2023-06-01 LAB — URINALYSIS, W/ REFLEX TO CULTURE (INFECTION SUSPECTED)
Bacteria, UA: NONE SEEN
Bilirubin Urine: NEGATIVE
Glucose, UA: NEGATIVE mg/dL
Hgb urine dipstick: NEGATIVE
Ketones, ur: NEGATIVE mg/dL
Nitrite: NEGATIVE
Protein, ur: NEGATIVE mg/dL
Specific Gravity, Urine: 1.009 (ref 1.005–1.030)
pH: 6 (ref 5.0–8.0)

## 2023-06-01 LAB — LIPASE, BLOOD: Lipase: 29 U/L (ref 11–51)

## 2023-06-01 MED ORDER — IOHEXOL 300 MG/ML  SOLN
100.0000 mL | Freq: Once | INTRAMUSCULAR | Status: AC | PRN
  Administered 2023-06-01: 100 mL via INTRAVENOUS

## 2023-06-01 NOTE — ED Triage Notes (Signed)
Pt reports left back pain "that radiates to the front" x 3 weeks, worse today.

## 2023-06-01 NOTE — ED Provider Notes (Signed)
[place] EMERGENCY DEPARTMENT AT [facility] Provider Note   CSN: [id number] Arrival date & time: 06/01/23  1738     History  Chief Complaint  Patient presents with   Back Pain    Virginia [name] is a 57 y.o. female with PMHx breast cancer who presents to ED concerned for right sided back pain since 04/2023. Went to physical therapy with some relief in symptoms. Aleve has also been helping, but pain increased today and intermittently radiates around towards epigastric area. 7/10 pain severity. Patient also complaining of mild leg weakness. Patient ambulatory in ED without problem.  Denies fever, cough, chest pain, dyspnea, nausea, vomiting, diarrhea, dysuria, hematuria, constipation.    Back Pain      Home Medications Prior to Admission medications   Medication Sig Start Date End Date Taking? Authorizing Provider  cetirizine (ZYRTEC) 10 MG tablet Take 10 mg by mouth daily.   Yes [provider]  fluticasone (FLONASE) 50 MCG/ACT nasal spray Place 2 sprays into both nostrils daily. 10/01/21  Yes Malva Limes, MD  fluvoxaMINE (LUVOX) 100 MG tablet TAKE ONE TABLET BY MOUTH TWICE DAILY 05/21/23  Yes Bacigalupo, Marzella Schlein, MD  naproxen sodium (ALEVE) 220 MG tablet Take 220 mg by mouth daily as needed.   Yes [provider]  omeprazole (PRILOSEC) 40 MG capsule Take 1 capsule (40 mg total) by mouth daily. 05/10/23  Yes Bacigalupo, Marzella Schlein, MD      Allergies    Patient has no known allergies.    Review of Systems   Review of Systems  Musculoskeletal:  Positive for back pain.    Physical Exam Updated Vital Signs BP 125/86   Pulse 72   Temp 98.1 F (36.7 C) (Oral)   Resp 15   Ht 5\' 1"  (1.549 m)   Wt 63.5 kg   LMP 06/21/2019   SpO2 100%   BMI 26.45 kg/m  Physical Exam Vitals and nursing note reviewed.  Constitutional:      General: She is not in acute distress.    Appearance: She is not ill-appearing or toxic-appearing.  HENT:      Head: Normocephalic and atraumatic.     Mouth/Throat:     Mouth: Mucous membranes are moist.  Eyes:     General: No scleral icterus.       Right eye: No discharge.        Left eye: No discharge.     Conjunctiva/sclera: Conjunctivae normal.  Cardiovascular:     Rate and Rhythm: Normal rate and regular rhythm.     Pulses: Normal pulses.     Heart sounds: Normal heart sounds. No murmur heard.    Comments: +2 radial and pedal pulse Pulmonary:     Effort: Pulmonary effort is normal. No respiratory distress.     Breath sounds: Normal breath sounds. No wheezing, rhonchi or rales.  Abdominal:     General: Abdomen is flat. There is no distension.     Palpations: There is no mass.     Tenderness: There is no abdominal tenderness.  Musculoskeletal:     Right lower leg: No edema.     Left lower leg: No edema.  Skin:    General: Skin is warm and dry.     Findings: No rash.  Neurological:     General: No focal deficit present.     Mental Status: She is alert. Mental status is at baseline.     Comments: GCS 15. Speech is goal  oriented. 5/5 strength against resistance in all major muscle groups bilaterally. Sensation to light touch intact. Patient moves extremities without ataxia. Patient ambulatory with steady gait.   Psychiatric:        Mood and Affect: Mood normal.     ED Results / Procedures / Treatments   Labs (all labs ordered are listed, but only abnormal results are displayed) Labs Reviewed  URINE CULTURE - Abnormal; Notable for the following components:      Result Value   Culture MULTIPLE SPECIES PRESENT, SUGGEST RECOLLECTION (*)    All other components within normal limits  COMPREHENSIVE METABOLIC PANEL - Abnormal; Notable for the following components:   Glucose, Bld 133 (*)    All other components within normal limits  URINALYSIS, W/ REFLEX TO CULTURE (INFECTION SUSPECTED) - Abnormal; Notable for the following components:   Color, Urine STRAW (*)    Leukocytes,Ua SMALL  (*)    All other components within normal limits  I-STAT CHEM 8, ED - Abnormal; Notable for the following components:   Glucose, Bld 125 (*)    All other components within normal limits  CBC WITH DIFFERENTIAL/PLATELET  LIPASE, BLOOD    EKG EKG Interpretation Date/Time:  Tuesday June 01 2023 17:50:00 EDT Ventricular Rate:  107 PR Interval:  114 QRS Duration:  92 QT Interval:  309 QTC Calculation: 413 R Axis:   81  Text Interpretation: Sinus tachycardia Low voltage, precordial leads Confirmed by Vonita Moss [phone]) on 06/03/2023 6:50:01 AM  Radiology CT ABDOMEN PELVIS W CONTRAST  Result Date: 06/01/2023 CLINICAL DATA:  Left back pain radiating to the front for 3 weeks worsening today EXAM: CT ABDOMEN AND PELVIS WITH CONTRAST TECHNIQUE: Multidetector CT imaging of the abdomen and pelvis was performed using the standard protocol following bolus administration of intravenous contrast. RADIATION DOSE REDUCTION: This exam was performed according to the departmental dose-optimization program which includes automated exposure control, adjustment of the mA and/or kV according to patient size and/or use of iterative reconstruction technique. CONTRAST:  OMNIPAQUE IOHEXOL 300 MG/ML  SOLN COMPARISON:  CT abdomen and pelvis 02/14/2020 FINDINGS: Lower chest: No acute abnormality. 4 mm nodule in the left lower lobe on 05/11 is unchanged from 2021 and benign. No follow-up recommended. Hepatobiliary: Unremarkable liver. Normal gallbladder. No biliary dilation. Pancreas: Unremarkable. Spleen: Unremarkable. Adrenals/Urinary Tract: Normal adrenal glands. No urinary calculi or hydronephrosis. Bladder is unremarkable. Stomach/Bowel: Normal caliber large and small bowel. No bowel wall thickening. The appendix is not visualized.Stomach is within normal limits. Vascular/Lymphatic: No significant vascular findings are present. No enlarged abdominal or pelvic lymph nodes. Reproductive: Globular enlarged fibroid  uterus. Other: No free intraperitoneal fluid or air. Musculoskeletal: No acute fracture. IMPRESSION: 1. No acute abnormality in the abdomen or pelvis. 2. Globular enlarged fibroid uterus. Electronically Signed   By: Minerva Fester M.D.   On: 06/01/2023 22:04    Procedures Procedures    Medications Ordered in ED Medications  iohexol (OMNIPAQUE) 300 MG/ML solution 100 mL (100 mLs Intravenous Contrast Given 06/01/23 2127)    ED Course/ Medical Decision Making/ A&P                             Medical Decision Making Amount and/or Complexity of Data Reviewed Labs: ordered. Radiology: ordered.  Risk Prescription drug management.   This patient presents to the ED for concern of right sided back pain, this involves an extensive number of treatment options, and is a complaint that  carries with it a high risk of complications and morbidity.  The differential diagnosis includes pyelonephritis, nephrolithiasis, spinal abscess, osteomyelitis, herniated disc, muscle strain, spinal fracture, meningitis, cancer, cauda equina syndrome, pancreatitis.   Co morbidities that complicate the patient evaluation  Hx breast cancer   Lab Tests:  I Ordered, and personally interpreted labs.  The pertinent results include:   - lipase: within normal limits - UA: not overly concerning for infection - patient denies dysuria, urinary frequency and hematuria - CMP:no concern for electrolyte abnormality; no concern for kidney/liver damage - CBC: No concern for anemia or leukocytosis    Imaging Studies ordered:  I ordered imaging studies including  -CT abd/pelvis: to assess for process contributing to patient's symptoms I independently visualized and interpreted imaging I agree with the radiologist interpretation    Problem List / ED Course / Critical interventions / Medication management  Patient presents to ED concerned for back pain. Physical/neuro exam unremarkable. Patient stating that she  believes it could be MSK, but was worried because her oncologist told her to get imaging if she started getting back pain after her breast cancer diagnosis. Patient received outpatient chest xray a couple of days ago that has not yet resulted. Obtaining CT abd/pelvis here in ED given that pain intermediately radiates to LUQ and should be able to also visualize patient's area of thoracic back pain. CT showing uterine fibroids and no other acute concerns. Patient aware of her uterine fibroids. CBC and CMP within normal limits. Patient is mildly hyperglycemic. Lipase within normal limits. UA with small leukocytes, no nitrites or bacteria - patient denying dysuria, hematuria, urinary frequency, or CVA tenderness. Patient afebrile with stable vitals. Given that patient's symptoms have been resolving with aleve and physical therapy, I believe patients symptoms are due to acute on chronic MSK flare. Patient denies urinary retention, fecal incontinence, saddle anesthesia, lower extremity weakness, fever, immunosuppression, IVDU, spinal procedure, significant trauma. I have reviewed the patients home medicines and have made adjustments as needed Patient afebrile with stable vitals. Patient ready for discharge. Provided patient with  return precaution.  Ddx: these are considered less likely due to history of present illness and physical exam -Pyelonephritis/nephrolithiasis: no abdominal or flank pain; no urinary symptoms -spinal abscess: no fever, no skin findings, no history of IVDU -osteomyelitis: no history of IVDU; pain is acute -spinal fracture: not a high force trauma associated with pain -meningitis: no fever; lack of meningism symptoms -cancer: symptoms are acute -cauda equina syndrome: denies saddle paresthesia, urinary retention, fecal incontinence   Social Determinants of Health:  none          Final Clinical Impression(s) / ED Diagnoses Final diagnoses:  Acute left-sided thoracic  back pain    Rx / DC Orders ED Discharge Orders     None         Dorthy Cooler, [place] 06/03/23 1521    Wynetta Fines, MD 06/07/23 1504

## 2023-06-01 NOTE — Discharge Instructions (Signed)
It was a pleasure caring for you today.  Lab workup was without concern that would be explaining your symptoms.  Seek emergency care if experiencing any new or worsening symptoms

## 2023-06-02 ENCOUNTER — Ambulatory Visit: Payer: BC Managed Care – PPO

## 2023-06-03 ENCOUNTER — Encounter: Payer: Self-pay | Admitting: *Deleted

## 2023-06-03 LAB — URINE CULTURE

## 2023-06-07 ENCOUNTER — Ambulatory Visit: Payer: BC Managed Care – PPO

## 2023-06-07 ENCOUNTER — Encounter: Payer: Self-pay | Admitting: Family Medicine

## 2023-06-07 ENCOUNTER — Telehealth (INDEPENDENT_AMBULATORY_CARE_PROVIDER_SITE_OTHER): Payer: BC Managed Care – PPO | Admitting: Family Medicine

## 2023-06-07 ENCOUNTER — Telehealth: Payer: Self-pay | Admitting: *Deleted

## 2023-06-07 DIAGNOSIS — M5414 Radiculopathy, thoracic region: Secondary | ICD-10-CM

## 2023-06-07 DIAGNOSIS — M419 Scoliosis, unspecified: Secondary | ICD-10-CM

## 2023-06-07 NOTE — Telephone Encounter (Signed)
-----   Message from Noreene Filbert sent at 06/06/2023  8:48 PM EDT ----- Regarding: FW: Please let patient know that her X-rays are good.  Please see how her pain is? ----- Message ----- From: Interface, Rad Results In Sent: 06/03/2023  11:04 AM EDT To: Loa Socks, NP

## 2023-06-07 NOTE — Progress Notes (Signed)
MyChart Video Visit    Virtual Visit via Video Note   This format is felt to be most appropriate for this patient at this time. Physical exam was limited by quality of the video and audio technology used for the visit.    Patient location: home Provider location: [facility] Persons involved in the visit: patient, provider   I discussed the limitations of evaluation and management by telemedicine and the availability of in person appointments. The patient expressed understanding and agreed to proceed.  Patient: Virginia [name]   DOB: 1966-07-18   57 y.o. Female  MRN: [id number] Visit Date: 06/07/2023  Today's healthcare provider: Shirlee Latch, MD   Chief Complaint  Patient presents with   Scoliosis    Patient had imaging done on 05/28/23 that revealed thoracic scoliosis.  Patient would like to discuss treatment options.    Subjective    HPI HPI     Scoliosis    Additional comments: Patient had imaging done on 05/28/23 that revealed thoracic scoliosis.  Patient would like to discuss treatment options.       Last edited by Adline Peals, [name] on 06/07/2023  2:49 PM.       Discussed the use of AI scribe software for clinical note transcription with the patient, who gave verbal consent to proceed.  History of Present Illness   The patient, with a history of cancer, presents with worsening back pain. She describes the pain as a 'tightness' that has been progressively worsening. The pain is located in the thoracic region and radiates to the front of the body. The pain is severe, rating it at a 7 out of 10, and has been interfering with her quality of life. The patient sought care at the ER due to the severity of the pain. Imaging studies performed during the ER visit revealed thoracic scoliosis. The patient has been attending physical therapy sessions, which have provided some relief. However, the pain persists, particularly when sitting for extended  periods or bending down. The patient is currently managing the pain with Aleve and Tylenol as needed.        Medications: Outpatient Medications Prior to Visit  Medication Sig   cetirizine (ZYRTEC) 10 MG tablet Take 10 mg by mouth daily.   fluticasone (FLONASE) 50 MCG/ACT nasal spray Place 2 sprays into both nostrils daily.   fluvoxaMINE (LUVOX) 100 MG tablet TAKE ONE TABLET BY MOUTH TWICE DAILY   naproxen sodium (ALEVE) 220 MG tablet Take 220 mg by mouth daily as needed.   omeprazole (PRILOSEC) 40 MG capsule Take 1 capsule (40 mg total) by mouth daily.   No facility-administered medications prior to visit.    Review of Systems per HPI       Objective    LMP 06/21/2019        Physical Exam Constitutional:      General: She is not in acute distress.    Appearance: Normal appearance.  HENT:     Head: Normocephalic.  Pulmonary:     Effort: Pulmonary effort is normal. No respiratory distress.  Neurological:     Mental Status: She is alert and oriented to person, place, and time. Mental status is at baseline.        Assessment & Plan     Problem List Items Addressed This Visit       Musculoskeletal and Integument   Scoliosis of thoracolumbar spine - Primary   Relevant Orders   Ambulatory referral to Physical  Medicine Rehab   Other Visit Diagnoses     Radicular pain of thoracic region       Relevant Orders   Ambulatory referral to Physical Medicine Rehab           Mild Thoracolumbar Scoliosis: Pain and stiffness on the left side, possibly due to nerve impingement. Pain is currently managed with Aleve and Tylenol. Patient has started physical therapy. -Refer to physiatrist at [facility] for possible steroid injection or nerve ablation. -Consider MRI as determined by the specialist. -Continue physical therapy and current pain management regimen.        Return if symptoms worsen or fail to improve.     I discussed the assessment and treatment  plan with the patient. The patient was provided an opportunity to ask questions and all were answered. The patient agreed with the plan and demonstrated an understanding of the instructions.   The patient was advised to call back or seek an in-person evaluation if the symptoms worsen or if the condition fails to improve as anticipated.   Shirlee Latch, MD [facility] [phone] (phone) [phone] (fax)  [facility]

## 2023-06-07 NOTE — Telephone Encounter (Signed)
Pt was already notified

## 2023-06-09 ENCOUNTER — Ambulatory Visit: Payer: BC Managed Care – PPO

## 2023-06-11 ENCOUNTER — Inpatient Hospital Stay (HOSPITAL_BASED_OUTPATIENT_CLINIC_OR_DEPARTMENT_OTHER): Payer: BC Managed Care – PPO | Admitting: Adult Health

## 2023-06-11 DIAGNOSIS — M546 Pain in thoracic spine: Secondary | ICD-10-CM | POA: Diagnosis not present

## 2023-06-11 DIAGNOSIS — C50411 Malignant neoplasm of upper-outer quadrant of right female breast: Secondary | ICD-10-CM | POA: Diagnosis not present

## 2023-06-11 DIAGNOSIS — Z171 Estrogen receptor negative status [ER-]: Secondary | ICD-10-CM | POA: Diagnosis not present

## 2023-06-11 NOTE — Assessment & Plan Note (Signed)
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  Stage Ib triple negative breast cancer: Signatera testing has been negative.  She will continue to undergo this--imaging completed this month has not shown any evidence for recurrence. Thoracic back pain: She has undergone x-ray imaging which has been negative in addition to a CT abdomen and pelvis which was also negative.  She has been performing physical therapy and has not noticed a significant amount of relief.  I placed a referral to Clementeen Graham to evaluate her pain whether it is consistent with scoliosis or if any additional imaging needs to be completed.  I also would like his take on whether injections would help.    She is happy with this plan and is in agreement with it.  We will see her back in 6 months for her regular breast cancer follow-up.

## 2023-06-11 NOTE — Progress Notes (Signed)
[facility] Cancer Follow up:    Erasmo Downer, MD [address] [place] [address]   DIAGNOSIS:  Cancer Staging  Malignant neoplasm of upper-outer quadrant of right breast in female, estrogen receptor negative (HCC) Staging form: Breast, AJCC 8th Edition - Clinical stage from 02/08/2020: Stage IB (cT1c, cN0, cM0, G3, ER-, PR-, HER2-) - Signed by Serena Croissant, MD on 02/08/2020 Stage prefix: Initial diagnosis Histologic grading system: 3 grade system - Pathologic stage from 08/06/2020: No Stage Recommended (ypT0, pN0, cM0) - Signed by Loa Socks, NP on 11/22/2020 Stage prefix: Post-therapy I connected with Esperanza Sheets on 06/11/23 at  8:30 AM EDT by telephone and verified that I am speaking with the correct person using two identifiers.  I discussed the limitations, risks, security and privacy concerns of performing an evaluation and management service by telephone and the availability of in person appointments.  I also discussed with the patient that there may be a patient responsible charge related to this service. The patient expressed understanding and agreed to proceed.    SUMMARY OF ONCOLOGIC HISTORY: Oncology History  Malignant neoplasm of upper-outer quadrant of right breast in female, estrogen receptor negative (HCC)  02/02/2020 Initial Diagnosis   Right breast lump tenderness. Diagnostic mammogram and [place] on 02/01/20 showed a 2.0cm mass at the 10 o'clock position with no axillary adenopathy. 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, ER/PR negative, Ki67 80%   02/08/2020 Cancer Staging   Staging form: Breast, AJCC 8th Edition - Clinical stage from 02/08/2020: Stage IB (cT1c, cN0, cM0, G3, ER-, PR-, HER2-) - Signed by Serena Croissant, MD on 02/08/2020   02/23/2020 - 06/14/2020 Neo-Adjuvant Chemotherapy   Adriamycin and Cytoxan x4 followed by Taxol and carboplatin x7 discontinued    02/25/2020 Genetic Testing    Negative. Genes tested: APC, ATM, AXIN2, BARD1, BMPR1A, BRCA1, BRCA2, BRIP1, CDH1, CDKN2A (p14ARF), CDKN2A (p16INK4a), CKD4, CHEK2, CTNNA1, DICER1, EPCAM (Deletion/duplication testing only), GREM1 (promoter region deletion/duplication testing only), KIT, MEN1, MLH1, MSH2, MSH3, MSH6, MUTYH, NBN, NF1, NHTL1, PALB2, PDGFRA, PMS2, POLD1, POLE, PTEN, RAD50, RAD51C, RAD51D, RNF43, SDHB, SDHC, SDHD, SMAD4, SMARCA4. STK11, TP53, TSC1, TSC2, and VHL.  The following genes were evaluated for sequence changes only: SDHA and HOXB13 c.251G>A variant only.   08/06/2020 Surgery   Bilateral mastectomies ([place]): no residual invasive carcinoma in the right breast and no evidence of malignancy on the left breast, with 2 right axillary lymph nodes negative for carcinoma.     CURRENT THERAPY: observation  INTERVAL HISTORY: Esperanza Sheets 57 y.o. female returns for follow-up of her triple negative breast cancer and recent pain.  She has been to the ER and had a CT abdomen pelvis that was negative for any malignancy.  We also did x-rays of her thoracic spine along with her bilateral ribs that demonstrated thoracic or lumbar scoliosis but no other abnormality.  He has been doing physical therapy that was ordered by her PCP however has not noticed a significant improvement.   Patient Active Problem List   Diagnosis Date Noted   Scoliosis of thoracolumbar spine 06/07/2023   Primary osteoarthritis of both knees 05/10/2023   Mixed obsessional thoughts and acts 10/23/2021   Adjustment disorder with mixed anxiety and depressed mood 10/23/2021   Overweight 01/20/2021   Port-A-Cath in place 03/22/2020   Genetic testing 02/26/2020   Family history of pancreatic cancer    Family history of breast cancer    Family history of  lung cancer    Malignant neoplasm of upper-outer quadrant of right breast in female, estrogen receptor negative (HCC) 02/08/2020   Disordered eating 09/30/2017   Avitaminosis D 09/30/2017    Family history of thyroid disease 09/30/2017   Fibroid 08/31/2017   Irregular bleeding 08/19/2017   Arthralgia of hip 10/07/2015    has No Known Allergies.  MEDICAL HISTORY: Past Medical History:  Diagnosis Date   Anorexia    Anxiety    Arthritis    knees   Cancer (HCC)    R Breast Cancer 2021   Family history of breast cancer    Family history of lung cancer    Family history of pancreatic cancer    Fibroids    Wears glasses     SURGICAL HISTORY: Past Surgical History:  Procedure Laterality Date   BREAST RECONSTRUCTION WITH PLACEMENT OF TISSUE EXPANDER AND ALLODERM Bilateral 08/06/2020   Procedure: BILATERAL BREAST RECONSTRUCTION WITH PLACEMENT OF TISSUE EXPANDER AND ALLODERM;  Surgeon: Glenna Fellows, MD;  Location: [facility];  Service: Plastics;  Laterality: Bilateral;   BREAST RECONSTRUCTION WITH PLACEMENT OF TISSUE EXPANDER AND ALLODERM Right 11/26/2020   Procedure: BREAST RECONSTRUCTION WITH PLACEMENT OF TISSUE EXPANDER AND ALLODERM;  Surgeon: Glenna Fellows, MD;  Location: [facility];  Service: Plastics;  Laterality: Right;   CLEFT PALATE REPAIR     CLEFT PALATE REPAIR  1970   COLONOSCOPY WITH PROPOFOL N/A 04/25/2018   Procedure: COLONOSCOPY WITH PROPOFOL;  Surgeon: Midge Minium, MD;  Location: [facility];  Service: Endoscopy;  Laterality: N/A;   LIPOSUCTION WITH LIPOFILLING Bilateral 02/21/2021   Procedure: LIPOSUCTION FROM ABDOMEN AND BILATERAL FLANKS; LIPOFILLING TO BILATERAL CHEST;  Surgeon: Glenna Fellows, MD;  Location: [facility];  Service: Plastics;  Laterality: Bilateral;   NIPPLE SPARING MASTECTOMY WITH SENTINEL LYMPH NODE BIOPSY Bilateral 08/06/2020   Procedure: BILATERAL NIPPLE SPARING MASTECTOMIES WITH RIGHT AXILLARY  SENTINEL LYMPH NODE MAPPING;  Surgeon: Harriette Bouillon, MD;  Location: [facility];  Service: General;  Laterality: Bilateral;  PECTORAL BLOCK   PORT-A-CATH REMOVAL Right  08/06/2020   Procedure: REMOVAL PORT-A-CATH;  Surgeon: Harriette Bouillon, MD;  Location: [facility];  Service: General;  Laterality: Right;   PORTACATH PLACEMENT Right 02/22/2020   Procedure: INSERTION PORT-A-CATH WITH ULTRASOUND GUIDANCE;  Surgeon: Harriette Bouillon, MD;  Location: [facility];  Service: General;  Laterality: Right;   REMOVAL OF BILATERAL TISSUE EXPANDERS WITH PLACEMENT OF BILATERAL BREAST IMPLANTS Bilateral 02/21/2021   Procedure: REMOVAL OF BILATERAL TISSUE EXPANDERS WITH PLACEMENT OF BILATERAL BREAST SILICONE IMPLANTS;  Surgeon: Glenna Fellows, MD;  Location: [facility];  Service: Plastics;  Laterality: Bilateral;   REMOVAL OF TISSUE EXPANDER AND PLACEMENT OF IMPLANT Bilateral 08/25/2020   Procedure: REMOVAL OF RIGHT BREAST TISSUE EXPANDER; DEBRIDEMENT BILATERAL MASTECTOMY FLAP NIPPLES;  Surgeon: Glenna Fellows, MD;  Location: [facility];  Service: Plastics;  Laterality: Bilateral;    SOCIAL HISTORY: Social History   Socioeconomic History   Marital status: Married    Spouse name: Brendolyn Patty   Number of children: 2   Years of education: 16   Highest education level: Bachelor's degree (e.g., BA, AB, BS)  Occupational History   Occupation: works on [employer]  Tobacco Use   Smoking status: Never    Passive exposure: Never   Smokeless tobacco: Never  Vaping Use   Vaping status: Never Used  Substance and Sexual Activity   Alcohol use: No    Alcohol/week: 0.0  standard drinks of alcohol   Drug use: No   Sexual activity: Yes    Partners: Male    Birth control/protection: Surgical    Comment: husband s/p vasectomy  Other Topics Concern   Not on file  Social History Narrative   Lives with husband   Social Determinants of Health   Financial Resource Strain: Low Risk  (03/30/2018)   Overall Financial Resource Strain (CARDIA)    Difficulty of Paying Living Expenses: Not hard at all  Food Insecurity: No Food Insecurity (03/30/2018)    Hunger Vital Sign    Worried About Running Out of Food in the Last Year: Never true    Ran Out of Food in the Last Year: Never true  Transportation Needs: No Transportation Needs (03/30/2018)   PRAPARE - Administrator, Civil Service (Medical): No    Lack of Transportation (Non-Medical): No  Physical Activity: Not on file  Stress: No Stress Concern Present (03/30/2018)   [employer] of Occupational Health - Occupational Stress Questionnaire    Feeling of Stress : Not at all  Social Connections: Not on file  Intimate Partner Violence: Not on file    FAMILY HISTORY: Family History  Problem Relation Age of Onset   Anxiety disorder Mother    Heart disease Father 56       CABG x4   Diabetes Father    Hypertension Father    Hypothyroidism Father    Anxiety disorder Daughter    Heart disease Maternal Grandmother    Stroke Maternal Grandmother 68   Heart disease Paternal Aunt    Stroke Paternal Aunt    Heart disease Paternal Uncle    Pancreatic cancer Paternal Uncle        dx 76s   Hypertension Sister    Hyperlipidemia Sister    Hypothyroidism Sister    Lung cancer Maternal Uncle    Healthy Son    Breast cancer Cousin    Lung cancer Paternal Aunt    Colon cancer Neg Hx    Ovarian cancer Neg Hx    Cervical cancer Neg Hx     Review of Systems  Constitutional:  Negative for appetite change, chills, fatigue, fever and unexpected weight change.  HENT:   Negative for hearing loss, lump/mass and trouble swallowing.   Eyes:  Negative for eye problems and icterus.  Respiratory:  Negative for chest tightness, cough and shortness of breath.   Cardiovascular:  Negative for chest pain, leg swelling and palpitations.  Gastrointestinal:  Negative for abdominal distention, abdominal pain, constipation, diarrhea, nausea and vomiting.  Endocrine: Negative for hot flashes.  Genitourinary:  Negative for difficulty urinating.   Musculoskeletal:  Positive for back pain. Negative  for arthralgias.  Skin:  Negative for itching and rash.  Neurological:  Negative for dizziness, extremity weakness, headaches and numbness.  Hematological:  Negative for adenopathy. Does not bruise/bleed easily.  Psychiatric/Behavioral:  Negative for depression. The patient is not nervous/anxious.       PHYSICAL EXAMINATION Pt appears well, in no apparent distress, mood and behavior are normal.        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  Stage Ib triple negative breast cancer: Signatera testing has been negative.  She  will continue to undergo this--imaging completed this month has not shown any evidence for recurrence. Thoracic back pain: She has undergone x-ray imaging which has been negative in addition to a CT abdomen and pelvis which was also negative.  She has been performing physical therapy and has not noticed a significant amount of relief.  I placed a referral to Clementeen Graham to evaluate her pain whether it is consistent with scoliosis or if any additional imaging needs to be completed.  I also would like his take on whether injections would help.    She is happy with this plan and is in agreement with it.  We will see her back in 6 months for her regular breast cancer follow-up.   Follow up instructions:    -Return to cancer center 6 months for f/u with Dr. Pamelia Hoit     The patient was provided an opportunity to ask questions and all were answered. The patient agreed with the plan and demonstrated an understanding of the instructions.   The patient was advised to call back or seek an in-person evaluation if the symptoms worsen or if the condition fails to improve  as anticipated.   I provided 15 minutes of face-to-face video visit time during this encounter, and > 50% was spent counseling as documented under my assessment & plan.  Lillard Anes, NP 06/11/23 9:24 AM Medical Oncology and Hematology [facility] [address] [place], [place] [address] Tel. [phone]    Fax. [phone]

## 2023-06-16 ENCOUNTER — Ambulatory Visit: Payer: BC Managed Care – PPO

## 2023-06-17 ENCOUNTER — Ambulatory Visit: Payer: BC Managed Care – PPO | Admitting: Family Medicine

## 2023-06-17 VITALS — BP 130/86 | HR 83 | Ht 61.0 in | Wt 143.0 lb

## 2023-06-17 DIAGNOSIS — M546 Pain in thoracic spine: Secondary | ICD-10-CM

## 2023-06-17 DIAGNOSIS — G8929 Other chronic pain: Secondary | ICD-10-CM | POA: Diagnosis not present

## 2023-06-17 DIAGNOSIS — M5414 Radiculopathy, thoracic region: Secondary | ICD-10-CM

## 2023-06-17 NOTE — Progress Notes (Signed)
Rubin Payor, PhD, LAT, ATC acting as a scribe for Clementeen Graham, MD.  Virginia [name] is a 57 y.o. female who presents to [facility] today for thoracic back pain ongoing since June. She has young grandbabies and felt like she was straining her back lifting them. She was working on Print production planner at [employer], and felt like she overdid it doing a bridge exercise. Pt locates pain to L-side to slightly middle at the bra strap line.   Radiates: yes- wraps around the rib cage to the anterior chest Aggravates: driving, poor posture, changing how she lift differently, increased activity Treatments tried: core strengthening, naproxen, Tylenol, heat, ice, PT, stretches  Dx imaging: 05/28/23 Ribs/Chest bilat & T-spine XR  06/01/23 Ab/Pelvis CT  Pertinent review of systems: No fevers or chills  Relevant historical information: History of breast cancer.  History of scoliosis   Exam:  BP 130/86   Pulse 83   Ht 5\' 1"  (1.549 m)   Wt 143 lb (64.9 kg)   LMP 06/21/2019   SpO2 99%   BMI 27.02 kg/m  General: Well Developed, well nourished, and in no acute distress.   MSK: T and L-spine nontender palpation midline.  Normal thoracic and lumbar motion. Lower extremity strength is intact.    Lab and Radiology Results  DG Ribs Bilateral W/Chest  Result Date: 06/03/2023 CLINICAL DATA:  Back and rib pain.  No known injury. EXAM: BILATERAL RIBS AND CHEST - 6 VIEW COMPARISON:  Chest radiograph dated 02/22/2020 FINDINGS: No fracture or other bone lesions are seen involving the ribs. There is no evidence of pneumothorax or pleural effusion. Both lungs are clear. Heart size and mediastinal contours are within normal limits. IMPRESSION: No radiographic finding of acute displaced fracture. Electronically Signed   By: Agustin Cree M.D.   On: 06/03/2023 11:01   DG Thoracic Spine 2 View  Result Date: 06/03/2023 CLINICAL DATA:  Back pain and rib pain.  History of breast cancer.  EXAM: THORACIC SPINE 2 VIEWS COMPARISON:  CT 02/14/2020 FINDINGS: Thoracolumbar scoliosis deformity is noted. Thoracic curvature is convex towards the right. The vertebral body heights are well preserved. No signs of acute fracture or subluxation. IMPRESSION: 1. No acute findings. 2. Thoracolumbar scoliosis. Electronically Signed   By: Signa Kell M.D.   On: 06/03/2023 10:59   CT ABDOMEN PELVIS W CONTRAST  Result Date: 06/01/2023 CLINICAL DATA:  Left back pain radiating to the front for 3 weeks worsening today EXAM: CT ABDOMEN AND PELVIS WITH CONTRAST TECHNIQUE: Multidetector CT imaging of the abdomen and pelvis was performed using the standard protocol following bolus administration of intravenous contrast. RADIATION DOSE REDUCTION: This exam was performed according to the departmental dose-optimization program which includes automated exposure control, adjustment of the mA and/or kV according to patient size and/or use of iterative reconstruction technique. CONTRAST:  OMNIPAQUE IOHEXOL 300 MG/ML  SOLN COMPARISON:  CT abdomen and pelvis 02/14/2020 FINDINGS: Lower chest: No acute abnormality. 4 mm nodule in the left lower lobe on 05/11 is unchanged from 2021 and benign. No follow-up recommended. Hepatobiliary: Unremarkable liver. Normal gallbladder. No biliary dilation. Pancreas: Unremarkable. Spleen: Unremarkable. Adrenals/Urinary Tract: Normal adrenal glands. No urinary calculi or hydronephrosis. Bladder is unremarkable. Stomach/Bowel: Normal caliber large and small bowel. No bowel wall thickening. The appendix is not visualized.Stomach is within normal limits. Vascular/Lymphatic: No significant vascular findings are present. No enlarged abdominal or pelvic lymph nodes. Reproductive: Globular enlarged fibroid uterus. Other: No free intraperitoneal fluid  or air. Musculoskeletal: No acute fracture. IMPRESSION: 1. No acute abnormality in the abdomen or pelvis. 2. Globular enlarged fibroid uterus.  Electronically Signed   By: Minerva Fester M.D.   On: 06/01/2023 22:04   I, Clementeen Graham, personally (independently) visualized and performed the interpretation of the images attached in this note.     Assessment and Plan: 57 y.o. female with left mid thoracic back pain with some pain radiating around the flank. She had a few initial physical therapy visits but paused to get further evaluation with her oncology survivorship provider Lillard Anes NP.  I think it be a great idea to return to PT for a bit more strengthening and stretching as well as dry needling.  If not improving in about a month of physical therapy I would recommend an MRI thoracic spine to better evaluate potential for thoracic radiculopathy.  She will contact me when she is completed about a month of PT and limited how she is feeling.  If not better I will order MRI T-spine.  PDMP not reviewed this encounter. Orders Placed This Encounter  Procedures   Ambulatory referral to Physical Therapy    Referral Priority:   Routine    Referral Type:   Physical Medicine    Referral Reason:   Specialty Services Required    Requested Specialty:   Physical Therapy    Number of Visits Requested:   1   No orders of the defined types were placed in this encounter.    Discussed warning signs or symptoms. Please see discharge instructions. Patient expresses understanding.   The above documentation has been reviewed and is accurate and complete Clementeen Graham, M.D.

## 2023-06-17 NOTE — Patient Instructions (Signed)
Thank you for coming in today.   Work on the home exercises.   Return to PT.   Give it a month.  If not getting better or getting worse we can an MRI.   Let me know in about 1 month.

## 2023-06-23 ENCOUNTER — Encounter: Payer: Self-pay | Admitting: Family Medicine

## 2023-06-23 NOTE — Telephone Encounter (Signed)
Forwarding to Dr. Georgina Snell as Juluis Rainier.

## 2023-07-14 ENCOUNTER — Ambulatory Visit: Payer: BC Managed Care – PPO

## 2023-08-08 LAB — SIGNATERA
SIGNATERA MTM READOUT: 0 MTM/ml
SIGNATERA TEST RESULT: NEGATIVE

## 2023-08-13 NOTE — Telephone Encounter (Signed)
Refer to 2/29 note

## 2023-08-20 ENCOUNTER — Telehealth: Payer: Self-pay

## 2023-08-20 NOTE — Telephone Encounter (Signed)
Called pt per MD to advise Signatera testing was negative/not detected. Pt verbalized understanding of results and knows [facility] will be in touch to schedule 3 mo repeat lab.   

## 2023-08-27 ENCOUNTER — Other Ambulatory Visit: Payer: Self-pay | Admitting: Physical Medicine & Rehabilitation

## 2023-08-27 DIAGNOSIS — M5414 Radiculopathy, thoracic region: Secondary | ICD-10-CM

## 2023-08-30 NOTE — Telephone Encounter (Signed)
error 

## 2023-09-01 NOTE — Telephone Encounter (Signed)
TC

## 2023-09-08 ENCOUNTER — Ambulatory Visit
Admission: RE | Admit: 2023-09-08 | Discharge: 2023-09-08 | Disposition: A | Discharge status: Home / Self Care | Payer: BC Managed Care – PPO | Source: Ambulatory Visit | Attending: Physical Medicine & Rehabilitation | Admitting: Physical Medicine & Rehabilitation

## 2023-09-08 DIAGNOSIS — M5414 Radiculopathy, thoracic region: Secondary | ICD-10-CM

## 2024-01-25 ENCOUNTER — Telehealth: Payer: Self-pay | Admitting: *Deleted

## 2024-01-25 LAB — SIGNATERA
SIGNATERA MTM READOUT: 0 MTM/ml
SIGNATERA TEST RESULT: NEGATIVE

## 2024-01-25 NOTE — Telephone Encounter (Signed)
Per MD request, RN placed call to pt regarding negative (Not Detected) recent Signatera testing.  No answer, LVM for pt to return call to the office.  

## 2024-01-31 ENCOUNTER — Encounter: Payer: Self-pay | Admitting: Adult Health

## 2024-02-01 ENCOUNTER — Other Ambulatory Visit: Payer: Self-pay | Admitting: *Deleted

## 2024-02-01 DIAGNOSIS — C50411 Malignant neoplasm of upper-outer quadrant of right female breast: Secondary | ICD-10-CM

## 2024-02-01 NOTE — Progress Notes (Signed)
 Signatera orders faxed with receipt of confirmation from [phone]

## 2024-03-10 ENCOUNTER — Other Ambulatory Visit: Payer: Self-pay | Admitting: Family Medicine

## 2024-03-10 DIAGNOSIS — F410 Panic disorder [episodic paroxysmal anxiety] without agoraphobia: Secondary | ICD-10-CM

## 2024-03-10 DIAGNOSIS — F422 Mixed obsessional thoughts and acts: Secondary | ICD-10-CM

## 2024-03-10 NOTE — Telephone Encounter (Signed)
 Requested Prescriptions  Pending Prescriptions Disp Refills   fluvoxaMINE  (LUVOX ) 100 MG tablet [Pharmacy Med Name: FLUVOXAMINE  MALEATE 100MG  TABS] 60 tablet 1    Sig: TAKE ONE TABLET BY MOUTH TWICE A DAY     Psychiatry:  Antidepressants - SSRI Failed - 03/10/2024  3:34 PM      Failed - Valid encounter within last 6 months    Recent Outpatient Visits   None     Future Appointments             In 2 months Bacigalupo, Stan Eans, MD [facility], [facility]             Courtesy refill. Patient must keep upcoming appointment for additional refills.

## 2024-04-29 ENCOUNTER — Encounter: Payer: Self-pay | Admitting: Family Medicine

## 2024-05-11 ENCOUNTER — Encounter: Payer: Self-pay | Admitting: Family Medicine

## 2024-06-15 ENCOUNTER — Encounter: Payer: Self-pay | Admitting: Family Medicine

## 2024-06-19 ENCOUNTER — Ambulatory Visit (INDEPENDENT_AMBULATORY_CARE_PROVIDER_SITE_OTHER): Payer: Self-pay | Admitting: Family Medicine

## 2024-06-19 ENCOUNTER — Encounter: Payer: Self-pay | Admitting: Family Medicine

## 2024-06-19 VITALS — HR 74 | Resp 16 | Ht 61.0 in | Wt 140.0 lb

## 2024-06-19 DIAGNOSIS — Z0000 Encounter for general adult medical examination without abnormal findings: Secondary | ICD-10-CM

## 2024-06-19 DIAGNOSIS — F410 Panic disorder [episodic paroxysmal anxiety] without agoraphobia: Secondary | ICD-10-CM

## 2024-06-19 DIAGNOSIS — F422 Mixed obsessional thoughts and acts: Secondary | ICD-10-CM

## 2024-06-19 DIAGNOSIS — F509 Eating disorder, unspecified: Secondary | ICD-10-CM

## 2024-06-19 DIAGNOSIS — R739 Hyperglycemia, unspecified: Secondary | ICD-10-CM | POA: Diagnosis not present

## 2024-06-19 DIAGNOSIS — Z8349 Family history of other endocrine, nutritional and metabolic diseases: Secondary | ICD-10-CM | POA: Diagnosis not present

## 2024-06-19 DIAGNOSIS — F4323 Adjustment disorder with mixed anxiety and depressed mood: Secondary | ICD-10-CM

## 2024-06-19 DIAGNOSIS — E559 Vitamin D deficiency, unspecified: Secondary | ICD-10-CM

## 2024-06-19 MED ORDER — FLUVOXAMINE MALEATE 100 MG PO TABS
100.0000 mg | ORAL_TABLET | Freq: Two times a day (BID) | ORAL | 3 refills | Status: AC
Start: 2024-06-19 — End: ?

## 2024-06-19 NOTE — Progress Notes (Unsigned)
 Complete physical exam   Patient: Virginia [name]   DOB: [birth year]   58 y.o. Female  MRN: [id number] Visit Date: 06/19/2024  Today's healthcare provider: Jon Eva, MD   Chief Complaint  Patient presents with  . Annual Exam    CPE.SABRA No other concerns   Subjective    Virginia [name] is a 58 y.o. female who presents today for a complete physical exam.    Discussed the use of AI scribe software for clinical note transcription with the patient, who gave verbal consent to proceed.  History of Present Illness            Last depression screening scores    06/19/2024    3:36 PM 05/10/2023    9:01 AM 05/07/2022    9:07 AM  PHQ 2/9 Scores  PHQ - 2 Score 0 0 2  PHQ- 9 Score 0 0 4   Last fall risk screening    05/10/2023    9:01 AM  Fall Risk   Falls in the past year? 0  Risk for fall due to : No Fall Risks    {VISON DENTAL STD PSA (Optional):27386}  {History (Optional):23778}  Medications: Outpatient Medications Prior to Visit  Medication Sig  . cetirizine (ZYRTEC) 10 MG tablet Take 10 mg by mouth daily.  . fluticasone  (FLONASE ) 50 MCG/ACT nasal spray Place 2 sprays into both nostrils daily.  . fluvoxaMINE  (LUVOX ) 100 MG tablet TAKE ONE TABLET BY MOUTH TWICE A DAY  . naproxen  sodium (ALEVE ) 220 MG tablet Take 220 mg by mouth daily as needed.  . omeprazole  (PRILOSEC) 40 MG capsule Take 1 capsule (40 mg total) by mouth daily.   No facility-administered medications prior to visit.    Review of Systems {Insert previous labs (optional):23779} {See past labs  Heme  Chem  Endocrine  Serology  Results Review (optional):1}  Objective    Pulse 74   Resp 16   Ht 5' 1 (1.549 m)   Wt 140 lb (63.5 kg)   LMP 06/21/2019   SpO2 100%   BMI 26.45 kg/m  {Insert last BP/Wt (optional):23777}{See vitals history (optional):1}  Physical Exam Vitals reviewed.  Constitutional:      General: She is not in acute distress.    Appearance: Normal appearance.  She is well-developed. She is not diaphoretic.  HENT:     Head: Normocephalic and atraumatic.     Right Ear: Tympanic membrane, ear canal and external ear normal.     Left Ear: Tympanic membrane, ear canal and external ear normal.     Nose: Nose normal.     Mouth/Throat:     Mouth: Mucous membranes are moist.     Pharynx: Oropharynx is clear. No oropharyngeal exudate.  Eyes:     General: No scleral icterus.    Conjunctiva/sclera: Conjunctivae normal.     Pupils: Pupils are equal, round, and reactive to light.  Neck:     Thyroid : No thyromegaly.  Cardiovascular:     Rate and Rhythm: Normal rate and regular rhythm.     Heart sounds: Normal heart sounds. No murmur heard. Pulmonary:     Effort: Pulmonary effort is normal. No respiratory distress.     Breath sounds: Normal breath sounds. No wheezing or rales.  Abdominal:     General: There is no distension.     Palpations: Abdomen is soft.     Tenderness: There is no abdominal tenderness.  Musculoskeletal:  General: No deformity.     Cervical back: Neck supple.     Right lower leg: No edema.     Left lower leg: No edema.  Lymphadenopathy:     Cervical: No cervical adenopathy.  Skin:    General: Skin is warm and dry.     Findings: No rash.  Neurological:     Mental Status: She is alert and oriented to person, place, and time. Mental status is at baseline.     Gait: Gait normal.  Psychiatric:        Mood and Affect: Mood normal.        Behavior: Behavior normal.        Thought Content: Thought content normal.      No results found for any visits on 06/19/24.  Assessment & Plan    Routine Health Maintenance and Physical Exam  Exercise Activities and Dietary recommendations  Goals   None     Immunization History  Administered Date(s) Administered  . Influenza,inj,Quad PF,6+ Mos 08/17/2019  . Influenza-Unspecified 08/08/2015, 09/13/2020, 10/16/2021  . PFIZER(Purple Top)SARS-COV-2 Vaccination 11/15/2019,  12/05/2019, 10/18/2020  . PNEUMOCOCCAL CONJUGATE-20 05/05/2021  . Td 11/06/2010, 01/20/2021  . Tdap 11/06/2010  . Zoster Recombinant(Shingrix) 09/10/2019, 05/05/2021    Health Maintenance  Topic Date Due  . Hepatitis B Vaccines (1 of 3 - 19+ 3-dose series) Never done  . COVID-19 Vaccine (4 - 2024-25 season) 07/18/2023  . INFLUENZA VACCINE  06/16/2024  . Cervical Cancer Screening (HPV/Pap Cotest)  01/23/2025  . Colonoscopy  04/25/2028  . DTaP/Tdap/Td (4 - Td or Tdap) 01/21/2031  . Pneumococcal Vaccine: 50+ Years  Completed  . Hepatitis C Screening  Completed  . HIV Screening  Completed  . Zoster Vaccines- Shingrix  Completed  . Pneumococcal Vaccine: 19-49 Years  Aged Out  . HPV VACCINES  Aged Out  . Meningococcal B Vaccine  Aged Out    Discussed health benefits of physical activity, and encouraged her to engage in regular exercise appropriate for her age and condition.  Problem List Items Addressed This Visit   None               No follow-ups on file.     Jon Eva, MD  [facility] [phone] (phone) [phone] (fax)  [facility]

## 2024-06-20 NOTE — Assessment & Plan Note (Signed)
 OCD and generalized anxiety disorder are well-managed with current medication regimen. Symptoms are present but manageable, with no significant interference in daily activities. Medication is effective in preventing obsessive loops. - Continue current medication regimen (fluvoxamine ) - Ensure adequate refills of medications

## 2024-06-20 NOTE — Assessment & Plan Note (Signed)
 Well controlled

## 2024-06-22 ENCOUNTER — Ambulatory Visit: Payer: Self-pay | Admitting: Family Medicine

## 2024-06-22 LAB — CBC WITH DIFFERENTIAL/PLATELET
Basophils Absolute: 0.1 x10E3/uL (ref 0.0–0.2)
Basos: 1 %
EOS (ABSOLUTE): 0.2 x10E3/uL (ref 0.0–0.4)
Eos: 2 %
Hematocrit: 42.3 % (ref 34.0–46.6)
Hemoglobin: 13.4 g/dL (ref 11.1–15.9)
Immature Grans (Abs): 0 x10E3/uL (ref 0.0–0.1)
Immature Granulocytes: 0 %
Lymphocytes Absolute: 2.1 x10E3/uL (ref 0.7–3.1)
Lymphs: 27 %
MCH: 29.5 pg (ref 26.6–33.0)
MCHC: 31.7 g/dL (ref 31.5–35.7)
MCV: 93 fL (ref 79–97)
Monocytes Absolute: 0.6 x10E3/uL (ref 0.1–0.9)
Monocytes: 8 %
Neutrophils Absolute: 4.8 x10E3/uL (ref 1.4–7.0)
Neutrophils: 62 %
Platelets: 208 x10E3/uL (ref 150–450)
RBC: 4.55 x10E6/uL (ref 3.77–5.28)
RDW: 13.3 % (ref 11.7–15.4)
WBC: 7.7 x10E3/uL (ref 3.4–10.8)

## 2024-06-22 LAB — LIPID PANEL
Chol/HDL Ratio: 2.8 ratio (ref 0.0–4.4)
Cholesterol, Total: 190 mg/dL (ref 100–199)
HDL: 67 mg/dL (ref >39)
LDL Chol Calc (NIH): 109 mg/dL — ABNORMAL HIGH (ref 0–99)
Triglycerides: 78 mg/dL (ref 0–149)
VLDL Cholesterol Cal: 14 mg/dL (ref 5–40)

## 2024-06-22 LAB — HEMOGLOBIN A1C
Est. average glucose Bld gHb Est-mCnc: 103 mg/dL
Hgb A1c MFr Bld: 5.2 % (ref 4.8–5.6)

## 2024-06-22 LAB — COMPREHENSIVE METABOLIC PANEL WITH GFR
ALT: 15 IU/L (ref 0–32)
AST: 18 IU/L (ref 0–40)
Albumin: 4.8 g/dL (ref 3.8–4.9)
Alkaline Phosphatase: 82 IU/L (ref 44–121)
BUN/Creatinine Ratio: 23 (ref 9–23)
BUN: 18 mg/dL (ref 6–24)
Bilirubin Total: 0.3 mg/dL (ref 0.0–1.2)
CO2: 22 mmol/L (ref 20–29)
Calcium: 9.8 mg/dL (ref 8.7–10.2)
Chloride: 103 mmol/L (ref 96–106)
Creatinine, Ser: 0.79 mg/dL (ref 0.57–1.00)
Globulin, Total: 2 g/dL (ref 1.5–4.5)
Glucose: 80 mg/dL (ref 70–99)
Potassium: 4.5 mmol/L (ref 3.5–5.2)
Sodium: 141 mmol/L (ref 134–144)
Total Protein: 6.8 g/dL (ref 6.0–8.5)
eGFR: 88 mL/min/1.73 (ref >59)

## 2024-06-22 LAB — TSH: TSH: 1.45 u[IU]/mL (ref 0.450–4.500)

## 2024-06-22 LAB — VITAMIN D 25 HYDROXY (VIT D DEFICIENCY, FRACTURES): Vit D, 25-Hydroxy: 33.5 ng/mL (ref 30.0–100.0)

## 2024-07-24 LAB — SIGNATERA
SIGNATERA MTM READOUT: 0 MTM/ml
SIGNATERA TEST RESULT: NEGATIVE

## 2024-10-04 ENCOUNTER — Telehealth: Payer: Self-pay

## 2024-10-04 NOTE — Telephone Encounter (Signed)
 Patient called regarding her follow-up appointment. Request was forwarded to scheduling for action.

## 2024-10-26 NOTE — Progress Notes (Signed)
 Chief Complaint: Chief Complaint  Patient presents with   Follow-up  Thoracic spine pain which radiates outwards towards the left  HPI: Patient is a pleasant 58 y.o. female seen in follow-up for the evaluation of thoracic spine pain which radiates outwards towards the left chest/abdominal area.  She is status post left T9-10 TFESI 10/02/2024 with 50% improvement.  She denies any problems or complications postinjection.  She rates her pain as a 1/10.  Is intermittent, dull, aching, tight. She has been very consistent with a home exercise program at the gym.  She is take gabapentin  100 mg twice daily.  She denies any side effects and feels it has been helping. She denies any numbness, tingling, weakness or loss of control of bowel or bladder.  Patient reports that this is chronic.  She did try physical therapy in February 2024 but had to discontinue it as it was cost prohibitive. Pain is worse with standing, walking, sitting and better with laying down, rest, heat, ice and stretching.  Pain is also worse when she is holding and picking up her grandchildren or anything heavy.  When she is not taking care of her grandchildren she is not noticing the pain.    Review of Systems: A 10 point review of systems is negative, except for the pertinent positives and negatives detailed in the HPI.  PMH: Past Medical History:  Diagnosis Date   Arthritis    History of cancer      PSH: Past Surgical History:  Procedure Laterality Date   CLEFT PALATE REPAIR     HEMORRHOIDECTOMY INTERNAL & EXTERNAL     MASTECTOMY       Family History: Family History  Problem Relation Name Age of Onset   Skin cancer Mother     Osteoporosis (Thinning of bones) Mother     Rheum arthritis Mother     Diabetes Father     High blood pressure (Hypertension) Father     Myocardial Infarction (Heart attack) Father     Diabetes type II Father     High blood pressure (Hypertension) Sister     Cancer Paternal  Uncle     Stroke Maternal Grandmother       Social History: Social History   Socioeconomic History   Marital status: Married  Tobacco Use   Smoking status: Never   Smokeless tobacco: Never  Substance and Sexual Activity   Alcohol  use: No   Drug use: No   Social Drivers of Corporate Investment Banker Strain: Low Risk (03/30/2018)   Received from [facility]   Overall Financial Resource Strain (CARDIA)    Difficulty of Paying Living Expenses: Not hard at all  Food Insecurity: No Food Insecurity (03/30/2018)   Received from [facility]   Hunger Vital Sign    Worried About Running Out of Food in the Last Year: Never true    Ran Out of Food in the Last Year: Never true  Transportation Needs: No Transportation Needs (03/30/2018)   Received from [facility] - [facility]    Lack of Transportation (Medical): No    Lack of Transportation (Non-Medical): No     Allergies: No Known Allergies   Medications:  Current Outpatient Medications:    cetirizine (ZYRTEC) 10 MG tablet, Take 10 mg by mouth once daily, Disp: , Rfl:    cholecalciferol, vitamin D3, 250 mcg (10,000 unit) Tab, Take by mouth, Disp: , Rfl:    fluvoxaMINE  (LUVOX ) 100 MG tablet, Take 100 mg  by mouth once daily, Disp: , Rfl:    gabapentin  (NEURONTIN ) 100 MG capsule, Take 1 capsule p.o. twice daily, Disp: 60 capsule, Rfl: 5   amoxicillin -clavulanate (AUGMENTIN ) 875-125 mg tablet, , Disp: , Rfl:    dextromethorphan HBr (VICKS DAYQUIL COUGH ORAL), Take by mouth, Disp: , Rfl:    escitalopram  oxalate (LEXAPRO ) 20 MG tablet, Take 20 mg by mouth once daily, Disp: , Rfl: 4   meloxicam  (MOBIC ) 15 MG tablet, Take 15 mg by mouth once daily, Disp: , Rfl: 0  Vitals: Vitals:   10/26/24 1452  BP: 121/84  Pulse: 84  Temp: 36.7 C (98 F)  TempSrc: Oral  Weight: 66.2 kg (145 lb 15.1 oz)  Height: 154.9 cm (5' 0.98)  PainSc: 1   PainLoc: Back    Imaging: MRI thoracic spine done at [facility]  09/2023: Left subarticular disc protrusion at T3-4, T4-5 and T5-6 which indents thecal sac; left facet arthropathy and left subarticular and foraminal disc protrusion at T9-10 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  Unfortunately pain still persist with her lifting up her grandchildren.  I did discuss with her the mechanism of action is likely causing impingement of the thoracic nerve causing radicular pain.  Unfortunately injections will not help with this.  I did tell her if things worsen I could consider sending her a referral to neurosurgery.  She is not interested in considering this right now.  She states that she does have a baby carrier that she will try using instead of holding the babies on her hips.  She thinks that this may help.   PT was cost prohibitive.   In terms of gabapentin , she has been taking gabapentin  100 mg twice daily.  I have been prescribing this.  She does not need refills.  She may continue to take this.  She denies any side effects and it has been helping.    Follow-up as needed.  She may call back for a left T9-10 TFESI no sooner than 01/03/2024.  Would consider increasing gabapentin .  Would consider surgical referral but would probably need physical therapy.    Patient agrees with above plan.  Answered all questions.

## 2024-12-28 ENCOUNTER — Ambulatory Visit: Admitting: Family Medicine

## 2025-01-25 ENCOUNTER — Inpatient Hospital Stay: Admitting: Hematology and Oncology

## 2025-06-21 ENCOUNTER — Encounter: Admitting: Family Medicine
